# Patient Record
Sex: Male | Born: 1935 | Race: White | Hispanic: No | Marital: Married | Smoking: Former smoker
Health system: Southern US, Community
[De-identification: ages and names within clinical notes are randomized; demographics above are authoritative.]

## PROBLEM LIST (undated history)

## (undated) DIAGNOSIS — M1990 Unspecified osteoarthritis, unspecified site: Secondary | ICD-10-CM

## (undated) DIAGNOSIS — I442 Atrioventricular block, complete: Secondary | ICD-10-CM

## (undated) DIAGNOSIS — H409 Unspecified glaucoma: Secondary | ICD-10-CM

## (undated) DIAGNOSIS — F329 Major depressive disorder, single episode, unspecified: Secondary | ICD-10-CM

## (undated) DIAGNOSIS — I5042 Chronic combined systolic (congestive) and diastolic (congestive) heart failure: Secondary | ICD-10-CM

## (undated) DIAGNOSIS — F419 Anxiety disorder, unspecified: Secondary | ICD-10-CM

## (undated) DIAGNOSIS — I2510 Atherosclerotic heart disease of native coronary artery without angina pectoris: Secondary | ICD-10-CM

## (undated) DIAGNOSIS — E7800 Pure hypercholesterolemia, unspecified: Secondary | ICD-10-CM

## (undated) DIAGNOSIS — I10 Essential (primary) hypertension: Secondary | ICD-10-CM

## (undated) DIAGNOSIS — I4891 Unspecified atrial fibrillation: Secondary | ICD-10-CM

## (undated) DIAGNOSIS — J309 Allergic rhinitis, unspecified: Secondary | ICD-10-CM

## (undated) DIAGNOSIS — N400 Enlarged prostate without lower urinary tract symptoms: Secondary | ICD-10-CM

## (undated) DIAGNOSIS — Z87438 Personal history of other diseases of male genital organs: Secondary | ICD-10-CM

## (undated) DIAGNOSIS — N529 Male erectile dysfunction, unspecified: Secondary | ICD-10-CM

## (undated) DIAGNOSIS — M461 Sacroiliitis, not elsewhere classified: Secondary | ICD-10-CM

## (undated) DIAGNOSIS — G5600 Carpal tunnel syndrome, unspecified upper limb: Secondary | ICD-10-CM

## (undated) DIAGNOSIS — K5730 Diverticulosis of large intestine without perforation or abscess without bleeding: Secondary | ICD-10-CM

## (undated) DIAGNOSIS — IMO0002 Reserved for concepts with insufficient information to code with codable children: Secondary | ICD-10-CM

## (undated) DIAGNOSIS — F3289 Other specified depressive episodes: Secondary | ICD-10-CM

## (undated) DIAGNOSIS — R296 Repeated falls: Secondary | ICD-10-CM

## (undated) HISTORY — DX: Male erectile dysfunction, unspecified: N52.9

## (undated) HISTORY — DX: Pure hypercholesterolemia, unspecified: E78.00

## (undated) HISTORY — DX: Allergic rhinitis, unspecified: J30.9

## (undated) HISTORY — DX: Atrioventricular block, complete: I44.2

## (undated) HISTORY — DX: Unspecified atrial fibrillation: I48.91

## (undated) HISTORY — DX: Carpal tunnel syndrome, unspecified upper limb: G56.00

## (undated) HISTORY — DX: Sacroiliitis, not elsewhere classified: M46.1

## (undated) HISTORY — DX: Personal history of other diseases of male genital organs: Z87.438

## (undated) HISTORY — DX: Unspecified glaucoma: H40.9

## (undated) HISTORY — DX: Diverticulosis of large intestine without perforation or abscess without bleeding: K57.30

## (undated) HISTORY — DX: Anxiety disorder, unspecified: F41.9

## (undated) HISTORY — DX: Chronic combined systolic (congestive) and diastolic (congestive) heart failure: I50.42

## (undated) HISTORY — DX: Unspecified osteoarthritis, unspecified site: M19.90

## (undated) HISTORY — DX: Other specified depressive episodes: F32.89

## (undated) HISTORY — DX: Reserved for concepts with insufficient information to code with codable children: IMO0002

## (undated) HISTORY — DX: Major depressive disorder, single episode, unspecified: F32.9

## (undated) HISTORY — DX: Repeated falls: R29.6

---
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
## 1964-03-11 HISTORY — PX: APPENDECTOMY: SHX54

## 1997-09-28 ENCOUNTER — Observation Stay (HOSPITAL_COMMUNITY): Admission: AD | Admit: 1997-09-28 | Discharge: 1997-09-29 | Payer: Self-pay | Admitting: Cardiology

## 1997-09-28 HISTORY — PX: PACEMAKER INSERTION: SHX728

## 1999-01-19 ENCOUNTER — Ambulatory Visit (HOSPITAL_COMMUNITY): Admission: RE | Admit: 1999-01-19 | Discharge: 1999-01-19 | Payer: Self-pay | Admitting: Gastroenterology

## 1999-09-24 ENCOUNTER — Encounter: Payer: Self-pay | Admitting: Plastic Surgery

## 1999-09-24 ENCOUNTER — Encounter: Admission: RE | Admit: 1999-09-24 | Discharge: 1999-09-24 | Payer: Self-pay | Admitting: Plastic Surgery

## 1999-09-25 ENCOUNTER — Ambulatory Visit (HOSPITAL_BASED_OUTPATIENT_CLINIC_OR_DEPARTMENT_OTHER): Admission: RE | Admit: 1999-09-25 | Discharge: 1999-09-25 | Payer: Self-pay | Admitting: Plastic Surgery

## 2001-01-09 ENCOUNTER — Emergency Department (HOSPITAL_COMMUNITY): Admission: AC | Admit: 2001-01-09 | Discharge: 2001-01-09 | Payer: Self-pay

## 2001-01-09 ENCOUNTER — Encounter: Payer: Self-pay | Admitting: *Deleted

## 2001-01-11 ENCOUNTER — Emergency Department (HOSPITAL_COMMUNITY): Admission: EM | Admit: 2001-01-11 | Discharge: 2001-01-12 | Payer: Self-pay | Admitting: Emergency Medicine

## 2001-01-12 ENCOUNTER — Encounter: Payer: Self-pay | Admitting: Emergency Medicine

## 2001-01-22 ENCOUNTER — Ambulatory Visit (HOSPITAL_COMMUNITY): Admission: RE | Admit: 2001-01-22 | Discharge: 2001-01-22 | Payer: Self-pay | Admitting: Gastroenterology

## 2001-02-26 ENCOUNTER — Encounter: Payer: Self-pay | Admitting: *Deleted

## 2001-02-26 ENCOUNTER — Encounter: Admission: RE | Admit: 2001-02-26 | Discharge: 2001-02-26 | Payer: Self-pay | Admitting: *Deleted

## 2003-04-18 ENCOUNTER — Encounter: Admission: RE | Admit: 2003-04-18 | Discharge: 2003-04-18 | Payer: Self-pay | Admitting: Family Medicine

## 2003-05-01 ENCOUNTER — Emergency Department (HOSPITAL_COMMUNITY): Admission: EM | Admit: 2003-05-01 | Discharge: 2003-05-01 | Payer: Self-pay | Admitting: Emergency Medicine

## 2003-07-04 ENCOUNTER — Inpatient Hospital Stay (HOSPITAL_BASED_OUTPATIENT_CLINIC_OR_DEPARTMENT_OTHER): Admission: RE | Admit: 2003-07-04 | Discharge: 2003-07-04 | Payer: Self-pay | Admitting: Interventional Cardiology

## 2004-03-30 ENCOUNTER — Encounter: Admission: RE | Admit: 2004-03-30 | Discharge: 2004-03-30 | Payer: Self-pay | Admitting: Family Medicine

## 2004-07-26 ENCOUNTER — Encounter: Admission: RE | Admit: 2004-07-26 | Discharge: 2004-07-26 | Payer: Self-pay | Admitting: Family Medicine

## 2004-12-06 ENCOUNTER — Ambulatory Visit (HOSPITAL_COMMUNITY): Admission: RE | Admit: 2004-12-06 | Discharge: 2004-12-06 | Payer: Self-pay | Admitting: Cardiology

## 2007-12-17 ENCOUNTER — Emergency Department (HOSPITAL_COMMUNITY): Admission: EM | Admit: 2007-12-17 | Discharge: 2007-12-17 | Payer: Self-pay | Admitting: Emergency Medicine

## 2007-12-30 ENCOUNTER — Ambulatory Visit (HOSPITAL_COMMUNITY): Admission: RE | Admit: 2007-12-30 | Discharge: 2007-12-30 | Payer: Self-pay | Admitting: Gastroenterology

## 2008-12-09 ENCOUNTER — Encounter: Admission: RE | Admit: 2008-12-09 | Discharge: 2008-12-09 | Payer: Self-pay | Admitting: Sports Medicine

## 2008-12-16 ENCOUNTER — Encounter: Admission: RE | Admit: 2008-12-16 | Discharge: 2008-12-16 | Payer: Self-pay | Admitting: Sports Medicine

## 2008-12-28 ENCOUNTER — Encounter: Admission: RE | Admit: 2008-12-28 | Discharge: 2008-12-28 | Payer: Self-pay | Admitting: Sports Medicine

## 2009-04-03 ENCOUNTER — Encounter: Admission: RE | Admit: 2009-04-03 | Discharge: 2009-04-03 | Payer: Self-pay | Admitting: Sports Medicine

## 2010-02-18 ENCOUNTER — Emergency Department (HOSPITAL_COMMUNITY)
Admission: EM | Admit: 2010-02-18 | Discharge: 2010-02-18 | Payer: Self-pay | Source: Home / Self Care | Admitting: Emergency Medicine

## 2010-05-21 LAB — URINALYSIS, ROUTINE W REFLEX MICROSCOPIC
Bilirubin Urine: NEGATIVE
Glucose, UA: NEGATIVE mg/dL
Ketones, ur: NEGATIVE mg/dL
Protein, ur: NEGATIVE mg/dL

## 2010-05-21 LAB — CBC
HCT: 40.8 % (ref 39.0–52.0)
MCH: 32.3 pg (ref 26.0–34.0)
MCV: 94.7 fL (ref 78.0–100.0)
RBC: 4.31 MIL/uL (ref 4.22–5.81)
RDW: 13.4 % (ref 11.5–15.5)
WBC: 17.3 10*3/uL — ABNORMAL HIGH (ref 4.0–10.5)

## 2010-05-21 LAB — POCT I-STAT, CHEM 8
BUN: 13 mg/dL (ref 6–23)
Calcium, Ion: 1.13 mmol/L (ref 1.12–1.32)
Creatinine, Ser: 0.8 mg/dL (ref 0.4–1.5)
TCO2: 26 mmol/L (ref 0–100)

## 2010-05-21 LAB — HEPATIC FUNCTION PANEL
AST: 29 U/L (ref 0–37)
Bilirubin, Direct: 0.2 mg/dL (ref 0.0–0.3)
Total Bilirubin: 0.6 mg/dL (ref 0.3–1.2)

## 2010-05-21 LAB — URINE CULTURE
Colony Count: NO GROWTH
Culture: NO GROWTH

## 2010-05-21 LAB — DIFFERENTIAL
Eosinophils Relative: 1 % (ref 0–5)
Lymphocytes Relative: 12 % (ref 12–46)
Lymphs Abs: 2 10*3/uL (ref 0.7–4.0)
Monocytes Absolute: 2.1 10*3/uL — ABNORMAL HIGH (ref 0.1–1.0)

## 2010-05-21 LAB — LIPASE, BLOOD: Lipase: 22 U/L (ref 11–59)

## 2010-07-27 NOTE — Op Note (Signed)
Randall Odom, BANIK                ACCOUNT NO.:  [id number]   MEDICAL RECORD NO.:  [id number]          PATIENT TYPE:  OIB   LOCATION:  2888                         FACILITY:  [facility]   PHYSICIAN:  Francisca December, M.D.  DATE OF BIRTH:  1926-11-29   DATE OF PROCEDURE:  12/06/2004  DATE OF DISCHARGE:                                 OPERATIVE REPORT   PROCEDURE PERFORMED:  1.  Explant, old pacing generator.  2.  Replace dual-chamber pacemaker, new battery.   SURGEON:  Francisca December, M.D.   INDICATIONS:  A St. Jude device, 6 years status post original implantation,  now at EOL.  This is the third device for this patient.  He had an upgrade  to a dual-chamber device in 1999.   PROCEDURE:  The patient was brought to the cardiac catheterization  laboratory in the fasting state.  The right groin was prepped and draped in  the usual sterile fashion.  Local anesthesia was obtained with infiltration  of 1% lidocaine.  A 6- to 7-cm incision was made over the previous pacemaker  surgical wound.  This was carried down by sharp dissection to the generator  capsule.  The capsule was incised and the pacemaker delivered without  difficulty.  The leads were disconnected from the old pacing generator and  tested for adequate pacing parameters.  This is reported below.  The pocket  was then copiously irrigated using 1% kanamycin solution.  a new pacing  generator was connected to the pacing leads, carefully identifying each by  its serial number, placing each in the appropriate receptacle, and  tightening each into place.  Each lead was tested for security.  The pacing  generator was then returned to the pocket.  The pocket inspected for  bleeding and none was found.  The pocket was then closed using 2-0 Vicryl in  a running fashion for the subcutaneous layer.  The skin was approximated  using 4-0 Vicryl in a running subcuticular fashion.  Steri-Strips and a  sterile dressing were applied and the  patient was transported to the  recovery area in stable condition.   The new device is a St. Jude Identity ADX XL DR, model number [id number], serial  number [id number].   The pacing lead data:  The ventricular lead detected a 10.5-mV R wave.  The  pacing threshold was 0.7 volts at 0.5 milliseconds.  The lead impedance was  491 ohms; this resulted in a currented capture threshold 1.3 mA.  The atrial  lead detected a 1.4-mV P wave.  The pacing threshold was 0.8 volts at 0.5-  millisecond pulse width.  The impedance was 339 ohms, resulting in an  currented capture threshold of 2.4 mA.      Francisca December, M.D.  Electronically Signed     JHE/MEDQ  D:  12/06/2004  T:  12/07/2004  Job:  [id number]

## 2010-07-27 NOTE — Cardiovascular Report (Signed)
NAME:  Randall Odom, Randall Odom                          ACCOUNT NO.:  [id number]   MEDICAL RECORD NO.:  [id number]                   PATIENT TYPE:  OIB   LOCATION:  6501                                 FACILITY:  [facility]   PHYSICIAN:  [name], M.D.            DATE OF BIRTH:  04/21/1926   DATE OF PROCEDURE:  07/04/2003  DATE OF DISCHARGE:                              CARDIAC CATHETERIZATION   INDICATIONS FOR PROCEDURE:  Abnormal Cardiolite study suggesting possibility  of inferior fixed defect compatible with possible prior infarction, mild  global hypokinesis with 48%.  It was felt that coronary disease needs to be  excluded.   DATE OF PROCEDURE:  July 04, 2003.   PROCEDURE PERFORMED:  1. Left heart catheterization.  2. Selective coronary angiography.  3. Left ventriculography.   DESCRIPTION:  After informed consent, a 4-French sheath was placed in the  right femoral artery using modified Seldinger technique.  A 4-French JR  catheter was used for right coronary angiography, 4-French #4 left Judkins  catheter for left coronary angiography and a 4-French pigtail angled  catheter for LV angiography.  Left ventricular angiography was performed at  10 mL per second for a total of 20 mL.  The patient tolerated the procedure  without complications.   RESULTS:   I. HEMODYNAMIC DATA:  A.  Left ventricular pressure 140/9.  B.  Aortic pressure 138/57.   II. LEFT VENTRICULOGRAPHY:  There is mild global hypokinesis and some  __________during contraction.  This is likely secondary to right ventricular  pacing.  No mitral regurgitation is noted.  The estimated EF is 45%.   III. CORONARY ANGIOGRAPHY:  A.  Left main coronary:  Left main was short and  free of any obstruction.  B.  Left anterior descending coronary:  A moderate size vessel that gives  origin to a large branching diagonal that arises from the proximal vessel.  Irregularities are noted in the LAD in the proximal and mid  vessel, both  proximal and beyond the origin of the diagonal.  The diagonal bifurcates and  also has some irregularity, but no significant obstruction lesions were  noted in the LAD.  C.  Circumflex artery:  The circumflex coronary artery is large and free of  any significant obstruction.  Minimal irregularities are noted in the mid  vessel.  Two obtuse marginal branches arise from the circumflex.  D.  Right coronary:  Gives origin to PDA and two left ventricular branches.  Irregularities are noted in the mid vessel.  No significant abnormalities  were seen.   CONCLUSIONS:  1. Low normal, mildly progressive left ventricular function.  There is an     abnormal contractile pattern felt to be secondary to right ventricular     pacing.  2. No significant obstructive coronary lesions were noted.  Irregularities     were noted in the circumflex, LAD and     right  coronary.  3. Abnormality noted on Cardiolite scintigraphy likely to represent soft     tissue attenuation.   PLAN:  No further specific therapy.                                               [name], M.D.    HWS/MEDQ  D:  07/04/2003  T:  07/04/2003  Job:  [id number]

## 2010-12-10 LAB — DIFFERENTIAL
Eosinophils Relative: 2
Lymphocytes Relative: 26
Lymphs Abs: 2.8
Monocytes Absolute: 1.2 — ABNORMAL HIGH
Monocytes Relative: 11

## 2010-12-10 LAB — CBC
MCV: 97.4
Platelets: 194
WBC: 10.8 — ABNORMAL HIGH

## 2010-12-10 LAB — COMPREHENSIVE METABOLIC PANEL
AST: 32
Albumin: 3.8
Calcium: 9.7
Chloride: 110
Creatinine, Ser: 0.79
GFR calc Af Amer: >60
Total Bilirubin: 0.8

## 2010-12-10 LAB — URINALYSIS, ROUTINE W REFLEX MICROSCOPIC
Bilirubin Urine: NEGATIVE
Glucose, UA: NEGATIVE
Hgb urine dipstick: NEGATIVE
Specific Gravity, Urine: <1.005 — ABNORMAL LOW
Urobilinogen, UA: 0.2

## 2011-02-11 ENCOUNTER — Emergency Department (HOSPITAL_COMMUNITY)
Admission: EM | Admit: 2011-02-11 | Discharge: 2011-02-11 | Disposition: A | Discharge status: Home / Self Care | Payer: Medicare Other | Attending: Emergency Medicine | Admitting: Emergency Medicine

## 2011-02-11 DIAGNOSIS — K5900 Constipation, unspecified: Secondary | ICD-10-CM | POA: Insufficient documentation

## 2011-02-11 DIAGNOSIS — I10 Essential (primary) hypertension: Secondary | ICD-10-CM | POA: Insufficient documentation

## 2011-02-11 DIAGNOSIS — E119 Type 2 diabetes mellitus without complications: Secondary | ICD-10-CM | POA: Insufficient documentation

## 2011-02-11 HISTORY — DX: Essential (primary) hypertension: I10

## 2011-02-11 HISTORY — DX: Benign prostatic hyperplasia without lower urinary tract symptoms: N40.0

## 2011-02-11 MED ORDER — SODIUM CHLORIDE 0.9 % IV SOLN: Freq: Once | INTRAVENOUS | Status: DC

## 2011-02-11 MED ORDER — FLEET ENEMA 7-19 GM/118ML RE ENEM
1.0000 | ENEMA | Freq: Once | RECTAL | Status: AC
  Administered 2011-02-11: 1 via RECTAL
  Filled 2011-02-11: qty 1

## 2011-02-11 NOTE — ED Notes (Signed)
ZOX:[id number]<AV> Expected date:02/11/11<BR> Expected time: 4:05 PM<BR> Means of arrival:Ambulance<BR> Comments:<BR> EMS 10 GC, 84 yom constipation

## 2011-02-11 NOTE — ED Provider Notes (Signed)
History     CSN: [id number] Arrival date & time: 02/11/2011  4:58 PM   First MD Initiated Contact with Patient 02/11/11 1707      Chief Complaint  Patient presents with  . Constipation    (Consider location/radiation/quality/duration/timing/severity/associated sxs/prior treatment) Patient is a 77 y.o. male presenting with constipation. The history is provided by the patient.  Constipation    complaining of constipation x5 days. No vomiting or fever noted does note some abdominal cramping. Has used stool softeners and a fleets enema at home without resolution. History of constipation x1. Patient called EMS to 2 his discomfort  Past Medical History  Diagnosis Date  . Hypertension   . Muscle spasm   . Diabetes mellitus   . Enlarged prostate     Past Surgical History  Procedure Date  . Pacemaker insertion     No family history on file.  History  Substance Use Topics  . Smoking status: Never Smoker   . Smokeless tobacco: Not on file  . Alcohol Use: No      Review of Systems  Gastrointestinal: Positive for constipation.  All other systems reviewed and are negative.    Allergies  Review of patient's allergies indicates no known allergies.  Home Medications   Current Outpatient Rx  Name Route Sig Dispense Refill  . ASPIRIN EC 81 MG PO TBEC Oral Take 81 mg by mouth daily.      Marland Kitchen DIAZEPAM 10 MG PO TABS Oral Take 10 mg by mouth every 6 (six) hours as needed. anxiety     . ESOMEPRAZOLE MAGNESIUM 40 MG PO CPDR Oral Take 40 mg by mouth daily before breakfast.      . GLIPIZIDE 10 MG PO TABS Oral Take 10 mg by mouth daily.      Marland Kitchen SIMVASTATIN 40 MG PO TABS Oral Take 40 mg by mouth at bedtime.      . TERAZOSIN HCL 5 MG PO CAPS Oral Take 5 mg by mouth at bedtime.      . VENLAFAXINE HCL 75 MG PO CP24 Oral Take 75 mg by mouth daily.        BP 94/39  Pulse 60  Temp(Src) 97.9 F (36.6 C) (Oral)  Resp 20  SpO2 96%  Physical Exam  Nursing note and vitals  reviewed. Constitutional: He is oriented to person, place, and time. He appears well-developed and well-nourished.  Non-toxic appearance. No distress.  HENT:  Head: Normocephalic and atraumatic.  Eyes: Conjunctivae, EOM and lids are normal. Pupils are equal, round, and reactive to light.  Neck: Normal range of motion. Neck supple. No tracheal deviation present. No mass present.  Cardiovascular: Normal rate, regular rhythm and normal heart sounds.  Exam reveals no gallop.   No murmur heard. Pulmonary/Chest: Effort normal and breath sounds normal. No stridor. No respiratory distress. He has no decreased breath sounds. He has no wheezes. He has no rhonchi. He has no rales.  Abdominal: Soft. Normal appearance and bowel sounds are normal. He exhibits no distension. There is no tenderness. There is no rebound and no CVA tenderness.  Genitourinary: Rectal exam shows tenderness.  Musculoskeletal: Normal range of motion. He exhibits no edema and no tenderness.  Neurological: He is alert and oriented to person, place, and time. He has normal strength. No cranial nerve deficit or sensory deficit. GCS eye subscore is 4. GCS verbal subscore is 5. GCS motor subscore is 6.  Skin: Skin is warm and dry. No abrasion and no rash noted.  Psychiatric: He has a normal mood and affect. His speech is normal and behavior is normal.    ED Course  Procedures (including critical care time)  Labs Reviewed - No data to display No results found.   No diagnosis found.    MDM  Patient disimpacted with copious amounts of stool. Patient given an enema by nursing with good result. Patient was back to his baseline        Toy Baker, MD 02/11/11 1944

## 2011-02-11 NOTE — ED Notes (Signed)
Per ems pt from home c/o constipation x 5 days, tried enema at home with no relief.

## 2011-03-25 DIAGNOSIS — C44221 Squamous cell carcinoma of skin of unspecified ear and external auricular canal: Secondary | ICD-10-CM | POA: Diagnosis not present

## 2011-03-26 DIAGNOSIS — K589 Irritable bowel syndrome without diarrhea: Secondary | ICD-10-CM | POA: Diagnosis not present

## 2011-03-26 DIAGNOSIS — I10 Essential (primary) hypertension: Secondary | ICD-10-CM | POA: Diagnosis not present

## 2011-03-26 DIAGNOSIS — E119 Type 2 diabetes mellitus without complications: Secondary | ICD-10-CM | POA: Diagnosis not present

## 2011-04-16 DIAGNOSIS — Z950 Presence of cardiac pacemaker: Secondary | ICD-10-CM | POA: Diagnosis not present

## 2011-05-06 ENCOUNTER — Emergency Department (HOSPITAL_COMMUNITY): Payer: Medicare Other

## 2011-05-06 ENCOUNTER — Encounter (HOSPITAL_COMMUNITY): Payer: Self-pay | Admitting: *Deleted

## 2011-05-06 ENCOUNTER — Other Ambulatory Visit: Payer: Self-pay

## 2011-05-06 ENCOUNTER — Inpatient Hospital Stay (HOSPITAL_COMMUNITY)
Admission: EM | Admit: 2011-05-06 | Discharge: 2011-05-09 | DRG: 074 | Disposition: A | Discharge status: Skilled Nursing Facility | Payer: Medicare Other | Attending: Internal Medicine | Admitting: Internal Medicine

## 2011-05-06 DIAGNOSIS — I10 Essential (primary) hypertension: Secondary | ICD-10-CM | POA: Diagnosis present

## 2011-05-06 DIAGNOSIS — Z79899 Other long term (current) drug therapy: Secondary | ICD-10-CM | POA: Diagnosis not present

## 2011-05-06 DIAGNOSIS — Z7982 Long term (current) use of aspirin: Secondary | ICD-10-CM

## 2011-05-06 DIAGNOSIS — Z9181 History of falling: Secondary | ICD-10-CM | POA: Diagnosis not present

## 2011-05-06 DIAGNOSIS — F341 Dysthymic disorder: Secondary | ICD-10-CM | POA: Diagnosis present

## 2011-05-06 DIAGNOSIS — N400 Enlarged prostate without lower urinary tract symptoms: Secondary | ICD-10-CM | POA: Diagnosis present

## 2011-05-06 DIAGNOSIS — E1149 Type 2 diabetes mellitus with other diabetic neurological complication: Principal | ICD-10-CM | POA: Diagnosis present

## 2011-05-06 DIAGNOSIS — R269 Unspecified abnormalities of gait and mobility: Secondary | ICD-10-CM | POA: Diagnosis not present

## 2011-05-06 DIAGNOSIS — E785 Hyperlipidemia, unspecified: Secondary | ICD-10-CM | POA: Diagnosis present

## 2011-05-06 DIAGNOSIS — R55 Syncope and collapse: Secondary | ICD-10-CM | POA: Diagnosis not present

## 2011-05-06 DIAGNOSIS — I951 Orthostatic hypotension: Secondary | ICD-10-CM | POA: Diagnosis present

## 2011-05-06 DIAGNOSIS — E119 Type 2 diabetes mellitus without complications: Secondary | ICD-10-CM | POA: Diagnosis not present

## 2011-05-06 DIAGNOSIS — S79929A Unspecified injury of unspecified thigh, initial encounter: Secondary | ICD-10-CM | POA: Diagnosis not present

## 2011-05-06 DIAGNOSIS — K219 Gastro-esophageal reflux disease without esophagitis: Secondary | ICD-10-CM | POA: Diagnosis present

## 2011-05-06 DIAGNOSIS — R42 Dizziness and giddiness: Secondary | ICD-10-CM | POA: Diagnosis not present

## 2011-05-06 DIAGNOSIS — I2510 Atherosclerotic heart disease of native coronary artery without angina pectoris: Secondary | ICD-10-CM | POA: Diagnosis present

## 2011-05-06 DIAGNOSIS — Z5189 Encounter for other specified aftercare: Secondary | ICD-10-CM | POA: Diagnosis not present

## 2011-05-06 DIAGNOSIS — Z950 Presence of cardiac pacemaker: Secondary | ICD-10-CM | POA: Diagnosis not present

## 2011-05-06 DIAGNOSIS — M6281 Muscle weakness (generalized): Secondary | ICD-10-CM | POA: Diagnosis not present

## 2011-05-06 DIAGNOSIS — G909 Disorder of the autonomic nervous system, unspecified: Secondary | ICD-10-CM | POA: Diagnosis present

## 2011-05-06 DIAGNOSIS — R5381 Other malaise: Secondary | ICD-10-CM | POA: Diagnosis not present

## 2011-05-06 DIAGNOSIS — W19XXXA Unspecified fall, initial encounter: Secondary | ICD-10-CM

## 2011-05-06 DIAGNOSIS — S79919A Unspecified injury of unspecified hip, initial encounter: Secondary | ICD-10-CM | POA: Diagnosis not present

## 2011-05-06 DIAGNOSIS — R531 Weakness: Secondary | ICD-10-CM

## 2011-05-06 DIAGNOSIS — G319 Degenerative disease of nervous system, unspecified: Secondary | ICD-10-CM | POA: Diagnosis not present

## 2011-05-06 DIAGNOSIS — IMO0001 Reserved for inherently not codable concepts without codable children: Secondary | ICD-10-CM | POA: Diagnosis not present

## 2011-05-06 DIAGNOSIS — F411 Generalized anxiety disorder: Secondary | ICD-10-CM | POA: Diagnosis not present

## 2011-05-06 DIAGNOSIS — R279 Unspecified lack of coordination: Secondary | ICD-10-CM | POA: Diagnosis not present

## 2011-05-06 DIAGNOSIS — I6529 Occlusion and stenosis of unspecified carotid artery: Secondary | ICD-10-CM | POA: Diagnosis present

## 2011-05-06 HISTORY — DX: Atherosclerotic heart disease of native coronary artery without angina pectoris: I25.10

## 2011-05-06 LAB — URINALYSIS, ROUTINE W REFLEX MICROSCOPIC
Bilirubin Urine: NEGATIVE
Glucose, UA: NEGATIVE mg/dL
Hgb urine dipstick: NEGATIVE
Ketones, ur: NEGATIVE mg/dL
Protein, ur: NEGATIVE mg/dL
pH: 5.5 (ref 5.0–8.0)

## 2011-05-06 LAB — CBC
HCT: 42 % (ref 39.0–52.0)
HCT: 40.9 % (ref 39.0–52.0)
Hemoglobin: 14.1 g/dL (ref 13.0–17.0)
Hemoglobin: 13.7 g/dL (ref 13.0–17.0)
MCH: 32.3 pg (ref 26.0–34.0)
MCV: 96.3 fL (ref 78.0–100.0)
Platelets: 135 10*3/uL — ABNORMAL LOW (ref 150–400)
RBC: 4.36 MIL/uL (ref 4.22–5.81)
WBC: 11.1 10*3/uL — ABNORMAL HIGH (ref 4.0–10.5)

## 2011-05-06 LAB — DIFFERENTIAL
Eosinophils Absolute: 0.2 10*3/uL (ref 0.0–0.7)
Eosinophils Relative: 2 % (ref 0–5)
Lymphocytes Relative: 28 % (ref 12–46)
Lymphs Abs: 2.7 10*3/uL (ref 0.7–4.0)
Monocytes Absolute: 1.1 10*3/uL — ABNORMAL HIGH (ref 0.1–1.0)
Monocytes Relative: 12 % (ref 3–12)

## 2011-05-06 LAB — POCT I-STAT, CHEM 8
BUN: 18 mg/dL (ref 6–23)
Creatinine, Ser: 1 mg/dL (ref 0.50–1.35)
Hemoglobin: 15 g/dL (ref 13.0–17.0)
Potassium: 4 mEq/L (ref 3.5–5.1)
Sodium: 144 mEq/L (ref 135–145)
TCO2: 27 mmol/L (ref 0–100)

## 2011-05-06 LAB — POCT I-STAT TROPONIN I: Troponin i, poc: 0 ng/mL (ref 0.00–0.08)

## 2011-05-06 LAB — CREATININE, SERUM
GFR calc Af Amer: >90 mL/min (ref >90)
GFR calc non Af Amer: 81 mL/min — ABNORMAL LOW (ref >90)

## 2011-05-06 LAB — URINE MICROSCOPIC-ADD ON

## 2011-05-06 MED ORDER — PANTOPRAZOLE SODIUM 40 MG PO TBEC
40.0000 mg | DELAYED_RELEASE_TABLET | Freq: Every day | ORAL | Status: DC
  Administered 2011-05-06 – 2011-05-09 (×4): 40 mg via ORAL
  Filled 2011-05-06 (×4): qty 1

## 2011-05-06 MED ORDER — VENLAFAXINE HCL ER 75 MG PO CP24
75.0000 mg | ORAL_CAPSULE | Freq: Every day | ORAL | Status: DC
  Administered 2011-05-06 – 2011-05-09 (×4): 75 mg via ORAL
  Filled 2011-05-06 (×4): qty 1

## 2011-05-06 MED ORDER — HEPARIN SODIUM (PORCINE) 5000 UNIT/ML IJ SOLN
5000.0000 [IU] | Freq: Three times a day (TID) | INTRAMUSCULAR | Status: DC
  Administered 2011-05-06 – 2011-05-09 (×8): 5000 [IU] via SUBCUTANEOUS
  Filled 2011-05-06 (×11): qty 1

## 2011-05-06 MED ORDER — SODIUM CHLORIDE 0.9 % IV SOLN
INTRAVENOUS | Status: AC
  Administered 2011-05-06: 17:00:00 via INTRAVENOUS

## 2011-05-06 MED ORDER — ADULT MULTIVITAMIN W/MINERALS CH
1.0000 | ORAL_TABLET | Freq: Every day | ORAL | Status: DC
  Administered 2011-05-06 – 2011-05-09 (×4): 1 via ORAL
  Filled 2011-05-06 (×4): qty 1

## 2011-05-06 MED ORDER — SODIUM CHLORIDE 0.9 % IV BOLUS (SEPSIS)
500.0000 mL | Freq: Once | INTRAVENOUS | Status: AC
  Administered 2011-05-06: 500 mL via INTRAVENOUS

## 2011-05-06 MED ORDER — GLIPIZIDE 10 MG PO TABS
10.0000 mg | ORAL_TABLET | Freq: Every day | ORAL | Status: DC
  Administered 2011-05-06 – 2011-05-09 (×4): 10 mg via ORAL
  Filled 2011-05-06 (×4): qty 1

## 2011-05-06 MED ORDER — SIMVASTATIN 40 MG PO TABS
40.0000 mg | ORAL_TABLET | Freq: Every day | ORAL | Status: DC
  Administered 2011-05-06 – 2011-05-08 (×3): 40 mg via ORAL
  Filled 2011-05-06 (×6): qty 1

## 2011-05-06 MED ORDER — SODIUM CHLORIDE 0.9 % IV SOLN: 250.0000 mL | INTRAVENOUS | Status: DC | PRN

## 2011-05-06 MED ORDER — ACETAMINOPHEN 325 MG PO TABS
650.0000 mg | ORAL_TABLET | Freq: Four times a day (QID) | ORAL | Status: DC | PRN
  Administered 2011-05-09: 650 mg via ORAL
  Filled 2011-05-06: qty 2

## 2011-05-06 MED ORDER — SODIUM CHLORIDE 0.9 % IJ SOLN
3.0000 mL | Freq: Two times a day (BID) | INTRAMUSCULAR | Status: DC
  Administered 2011-05-07 – 2011-05-09 (×5): 3 mL via INTRAVENOUS

## 2011-05-06 MED ORDER — DIAZEPAM 5 MG PO TABS
5.0000 mg | ORAL_TABLET | Freq: Four times a day (QID) | ORAL | Status: DC | PRN
  Administered 2011-05-06 – 2011-05-07 (×2): 5 mg via ORAL
  Filled 2011-05-06 (×2): qty 1

## 2011-05-06 MED ORDER — TERAZOSIN HCL 5 MG PO CAPS
5.0000 mg | ORAL_CAPSULE | Freq: Every day | ORAL | Status: DC
  Administered 2011-05-06: 5 mg via ORAL
  Filled 2011-05-06 (×2): qty 1

## 2011-05-06 MED ORDER — SODIUM CHLORIDE 0.9 % IJ SOLN: 3.0000 mL | INTRAMUSCULAR | Status: DC | PRN

## 2011-05-06 MED ORDER — ASPIRIN EC 81 MG PO TBEC
81.0000 mg | DELAYED_RELEASE_TABLET | Freq: Every day | ORAL | Status: DC
  Administered 2011-05-06 – 2011-05-09 (×4): 81 mg via ORAL
  Filled 2011-05-06 (×4): qty 1

## 2011-05-06 MED ORDER — ACETAMINOPHEN 650 MG RE SUPP: 650.0000 mg | Freq: Four times a day (QID) | RECTAL | Status: DC | PRN

## 2011-05-06 MED ORDER — INSULIN ASPART 100 UNIT/ML ~~LOC~~ SOLN
0.0000 [IU] | Freq: Three times a day (TID) | SUBCUTANEOUS | Status: DC
  Administered 2011-05-08: 3 [IU] via SUBCUTANEOUS
  Administered 2011-05-09: 2 [IU] via SUBCUTANEOUS
  Filled 2011-05-06: qty 3

## 2011-05-06 NOTE — ED Notes (Signed)
Pt states that for the past few days he has been very weak and states that when he stands up he starts to fall backwards and cannot correct it before he hits the ground. He states that at times he feels dizzy. He states that he is having right lower back pain. He states that he has been having uti s/s with frequency and sometimes hesitancy and burning. Breath sounds are clear and bowel sounds are present. Iv started and protocols initiated. Speech is clear, no facial droop noted. Pt states that he has bilateral lower leg tingling but contributes this to his diabetic neuropathy. Family at the bedside states that when the patient has fallen he has hit his head. Pt states that his head is a little sore. Family and pt deny any loc with the fall.

## 2011-05-06 NOTE — ED Provider Notes (Signed)
Medical screening examination/treatment/procedure(s) were conducted as a shared visit with non-physician practitioner(s) and myself.  I personally evaluated the patient during the encounter  Patient with hx of several falls and weakness although no distinct focal deficit. Head CT negative but needs admission and MRI work up. [facility] will admit.   Randall Jakes, MD 05/06/11 [phone]

## 2011-05-06 NOTE — H&P (Signed)
PCP:   Hoyle Sauer, MD, MD   Chief Complaint:  "Unsteady on my feet when I stand up"  HPI: Pt is an 78 y/o CM with HTN, DM (~ 15 years on oral hypoglycemic medication),  s/p placemaker placement that is presenting to the ED complaining of near syncope which has been a recurrent problem for the patient.  Mentions that he has had this problem for the last 2 years but mentions that within the past he may have felt dizzy and that would resolved within 5 minutes or less.  What is different now is that it has been associated with falls x 2.  He states that he hit his head with the last episode.  Mentions that when he tried to get out of bed and then he fell forward.  Episodes are related to positional changes and last several minutes but self resolve.  States that his vision has blacked out while these episodes are occuring and self resolved in seconds.  Wife and patient report that he has not had any seizure like activity.   States that his pacemaker was "checked" 3 weeks ago.  Reportedly by ED PA patient's pacemaker is to get interrogated while in the ED today.  Patient denies any recent upper respiratory infections, ringing in his ears, fever or chills.    Allergies:  No Known Allergies    Past Medical History  Diagnosis Date  . Hypertension   . Muscle spasm   . Diabetes mellitus   . Enlarged prostate   . CAD (coronary artery disease)   . Pacemaker     Past Surgical History  Procedure Date  . Pacemaker insertion     Prior to Admission medications   Medication Sig Start Date End Date Taking? Authorizing Provider  aspirin EC 81 MG tablet Take 81 mg by mouth daily.     Yes Historical Provider, MD  Cholecalciferol (VITAMIN D) 2000 UNITS tablet Take 2,000 Units by mouth daily.   Yes Historical Provider, MD  diazepam (VALIUM) 10 MG tablet Take 10 mg by mouth every 6 (six) hours as needed. anxiety    Yes Historical Provider, MD  esomeprazole (NEXIUM) 40 MG capsule Take 40 mg by mouth  daily before breakfast.     Yes Historical Provider, MD  Flax OIL Take 1 tablet by mouth daily.   Yes Historical Provider, MD  glipiZIDE (GLUCOTROL) 10 MG tablet Take 10 mg by mouth daily.     Yes Historical Provider, MD  lisinopril (PRINIVIL,ZESTRIL) 10 MG tablet Take 5 mg by mouth at bedtime.   Yes Historical Provider, MD  Multiple Vitamin (MULITIVITAMIN WITH MINERALS) TABS Take 1 tablet by mouth daily.   Yes Historical Provider, MD  OVER THE COUNTER MEDICATION Take 4 tablets by mouth daily. Sal Palmetto.   Yes Historical Provider, MD  OVER THE COUNTER MEDICATION Take 1 tablet by mouth daily. Mega D-3   Yes Historical Provider, MD  polyethylene glycol (MIRALAX / GLYCOLAX) packet Take 17 g by mouth daily as needed. As needed for constipation.   Yes Historical Provider, MD  simvastatin (ZOCOR) 40 MG tablet Take 40 mg by mouth at bedtime.     Yes Historical Provider, MD  terazosin (HYTRIN) 5 MG capsule Take 5 mg by mouth at bedtime.     Yes Historical Provider, MD  venlafaxine (EFFEXOR-XR) 75 MG 24 hr capsule Take 75 mg by mouth daily.      Historical Provider, MD    Social History:  reports that he  has never smoked. He does not have any smokeless tobacco history on file. He reports that he does not drink alcohol or use illicit drugs.  No family history on file.  Review of Systems:  Constitutional: Denies fever, chills, diaphoresis, appetite change and fatigue.  HEENT: Denies photophobia, eye pain, redness, hearing loss, ear pain, congestion, sore throat, rhinorrhea, sneezing, mouth sores, trouble swallowing, neck pain, neck stiffness and tinnitus.   Respiratory: Denies SOB, DOE, cough, chest tightness,  and wheezing.   Cardiovascular: Denies chest pain, palpitations and leg swelling.  Gastrointestinal: Denies nausea, vomiting, abdominal pain, diarrhea, constipation, blood in stool and abdominal distention.  Genitourinary: Denies dysuria, urgency, frequency, hematuria, flank pain and  difficulty urinating.  Musculoskeletal: Denies myalgias, back pain, joint swelling, arthralgias and gait problem.  Skin: Denies pallor, rash and wound.  Neurological:+ dizziness with position changes, Denies seizures, + syncope, Denies weakness, light-headedness, numbness and headaches.  Hematological: Denies adenopathy. Easy bruising, personal or family bleeding history  Psychiatric/Behavioral: Denies suicidal ideation, mood changes, confusion, nervousness, sleep disturbance and agitation   Physical Exam: Blood pressure 128/51, pulse 60, temperature 97.1 F (36.2 C), temperature source Oral, resp. rate 20, height 5\' 9"  (1.753 m), weight 87.091 kg (192 lb), SpO2 99.00%. General: Alert, awake, oriented x3, in no acute distress. HEENT: normocephalic, no contusions noted, tenderness at occipital scalp, No rhinorrhea, Normal tympanic membranes no errythema No bruits, no goiter. Heart: Regular rate and rhythm, without murmurs, rubs, gallops. Lungs: Clear to auscultation bilaterally. No increased WOB Abdomen: Soft, nontender, nondistended, positive bowel sounds. Extremities: No clubbing cyanosis or edema with positive pedal pulses. Neuro: Grossly intact, nonfocal.    Labs on Admission:  Results for orders placed during the hospital encounter of 05/06/11 (from the past 48 hour(s))  CBC     Status: Abnormal   Collection Time   05/06/11 11:56 AM      Component Value Range Comment   WBC 9.5  4.0 - 10.5 (K/uL)    RBC 4.36  4.22 - 5.81 (MIL/uL)    Hemoglobin 14.1  13.0 - 17.0 (g/dL)    HCT 16.1  09.6 - 04.5 (%)    MCV 96.3  78.0 - 100.0 (fL)    MCH 32.3  26.0 - 34.0 (pg)    MCHC 33.6  30.0 - 36.0 (g/dL)    RDW 40.9  81.1 - 91.4 (%)    Platelets 135 (*) 150 - 400 (K/uL)   DIFFERENTIAL     Status: Abnormal   Collection Time   05/06/11 11:56 AM      Component Value Range Comment   Neutrophils Relative 58  43 - 77 (%)    Neutro Abs 5.5  1.7 - 7.7 (K/uL)    Lymphocytes Relative 28  12 - 46 (%)     Lymphs Abs 2.7  0.7 - 4.0 (K/uL)    Monocytes Relative 12  3 - 12 (%)    Monocytes Absolute 1.1 (*) 0.1 - 1.0 (K/uL)    Eosinophils Relative 2  0 - 5 (%)    Eosinophils Absolute 0.2  0.0 - 0.7 (K/uL)    Basophils Relative 0  0 - 1 (%)    Basophils Absolute 0.0  0.0 - 0.1 (K/uL)   POCT I-STAT TROPONIN I     Status: Normal   Collection Time   05/06/11 12:11 PM      Component Value Range Comment   Troponin i, poc 0.00  0.00 - 0.08 (ng/mL)    Comment 3  POCT I-STAT, CHEM 8     Status: Abnormal   Collection Time   05/06/11 12:13 PM      Component Value Range Comment   Sodium 144  135 - 145 (mEq/L)    Potassium 4.0  3.5 - 5.1 (mEq/L)    Chloride 106  96 - 112 (mEq/L)    BUN 18  6 - 23 (mg/dL)    Creatinine, Ser 1.61  0.50 - 1.35 (mg/dL)    Glucose, Bld 096 (*) 70 - 99 (mg/dL)    Calcium, Ion 0.45  1.12 - 1.32 (mmol/L)    TCO2 27  0 - 100 (mmol/L)    Hemoglobin 15.0  13.0 - 17.0 (g/dL)    HCT 40.9  81.1 - 91.4 (%)   URINALYSIS, ROUTINE W REFLEX MICROSCOPIC     Status: Abnormal   Collection Time   05/06/11  1:14 PM      Component Value Range Comment   Color, Urine YELLOW  YELLOW     APPearance CLEAR  CLEAR     Specific Gravity, Urine 1.019  1.005 - 1.030     pH 5.5  5.0 - 8.0     Glucose, UA NEGATIVE  NEGATIVE (mg/dL)    Hgb urine dipstick NEGATIVE  NEGATIVE     Bilirubin Urine NEGATIVE  NEGATIVE     Ketones, ur NEGATIVE  NEGATIVE (mg/dL)    Protein, ur NEGATIVE  NEGATIVE (mg/dL)    Urobilinogen, UA 0.2  0.0 - 1.0 (mg/dL)    Nitrite NEGATIVE  NEGATIVE     Leukocytes, UA SMALL (*) NEGATIVE    URINE MICROSCOPIC-ADD ON     Status: Normal   Collection Time   05/06/11  1:14 PM      Component Value Range Comment   Squamous Epithelial / LPF RARE  RARE     WBC, UA 0-2  <3 (WBC/hpf)     Radiological Exams on Admission: Dg Chest 2 View  05/06/2011  *RADIOLOGY REPORT*  Clinical Data: Multiple falls, weakness  CHEST - 2 VIEW  Comparison: 07/26/2004  Findings: Lungs are  clear.  No pleural effusion or pneumothorax.  Cardiomediastinal silhouette is within normal limits.  Right subclavian pacemaker.  Degenerative changes of the visualized thoracolumbar spine.  IMPRESSION: No evidence of acute cardiopulmonary disease.  Original Report Authenticated By: Charline Bills, M.D.   Dg Hip Complete Right  05/06/2011  *RADIOLOGY REPORT*  Clinical Data: Weakness.  Fall  RIGHT HIP - COMPLETE 2+ VIEW  Comparison: None.  Findings: There is no evidence of fracture or dislocation.  There is no evidence of arthropathy or other focal bone abnormality. Soft tissues are unremarkable.  IMPRESSION: Negative exam.  Original Report Authenticated By: Rosealee Albee, M.D.   Ct Head Wo Contrast  05/06/2011  *RADIOLOGY REPORT*  Clinical Data:  Weakness, fall, dizziness  CT HEAD WITHOUT CONTRAST CT CERVICAL SPINE WITHOUT CONTRAST  Technique:  Multidetector CT imaging of the head and cervical spine was performed following the standard protocol without intravenous contrast.  Multiplanar CT image reconstructions of the cervical spine were also generated.  Comparison:  None.  CT HEAD  Findings: No evidence of parenchymal hemorrhage or extra-axial fluid collection. No mass lesion, mass effect, or midline shift.  No CT evidence of acute infarction.  Subcortical white matter and periventricular small vessel ischemic changes.  Intracranial atherosclerosis.  Global cortical atrophy with secondary ventriculomegaly.  The visualized paranasal sinuses are essentially clear. The mastoid air cells are unopacified.  No evidence of calvarial  fracture.  IMPRESSION: No evidence of acute intracranial abnormality.  Atrophy with small vessel ischemic changes and intracranial atherosclerosis.  CT CERVICAL SPINE  Findings: Normal cervical lordosis.  No evidence of fracture or dislocation.  Vertebral body heights are maintained.  The dens appears intact.  No prevertebral soft tissue swelling.  Moderate multilevel degenerative  changes.  Visualized thyroid is unremarkable.  Vascular calcifications.  Right subclavian pacemaker leads, incompletely visualized.  IMPRESSION:  No evidence of traumatic injury to the cervical spine.  Moderate multilevel degenerative changes.  Original Report Authenticated By: Charline Bills, M.D.   Ct Cervical Spine Wo Contrast  05/06/2011  *RADIOLOGY REPORT*  Clinical Data:  Weakness, fall, dizziness  CT HEAD WITHOUT CONTRAST CT CERVICAL SPINE WITHOUT CONTRAST  Technique:  Multidetector CT imaging of the head and cervical spine was performed following the standard protocol without intravenous contrast.  Multiplanar CT image reconstructions of the cervical spine were also generated.  Comparison:  None.  CT HEAD  Findings: No evidence of parenchymal hemorrhage or extra-axial fluid collection. No mass lesion, mass effect, or midline shift.  No CT evidence of acute infarction.  Subcortical white matter and periventricular small vessel ischemic changes.  Intracranial atherosclerosis.  Global cortical atrophy with secondary ventriculomegaly.  The visualized paranasal sinuses are essentially clear. The mastoid air cells are unopacified.  No evidence of calvarial fracture.  IMPRESSION: No evidence of acute intracranial abnormality.  Atrophy with small vessel ischemic changes and intracranial atherosclerosis.  CT CERVICAL SPINE  Findings: Normal cervical lordosis.  No evidence of fracture or dislocation.  Vertebral body heights are maintained.  The dens appears intact.  No prevertebral soft tissue swelling.  Moderate multilevel degenerative changes.  Visualized thyroid is unremarkable.  Vascular calcifications.  Right subclavian pacemaker leads, incompletely visualized.  IMPRESSION:  No evidence of traumatic injury to the cervical spine.  Moderate multilevel degenerative changes.  Original Report Authenticated By: Charline Bills, M.D.    Assessment/Plan 1) Syncope:  At this point most likely due to orthostatic  hypotension and suspect Diabetic autonomic neuropathy. - Obtain orthostatic blood pressure checks and monitor vitals - Patient is to get pacemaker interrogated per ED PA will await results of this - Head CT was negative.  Will not order further imaging - Carotid dopplers - EKG next AM - Will order Echo to r/o other causes of syncope related to cardiac cause - No complaints of SOB, chest pain will not check chest CT angiogram at this juncture for PE - fall precautions.  2) HTN:  Currently patient has lisinopril. Given possibility of orthostatic hypotension will plan on holding for now.  3) DM:   - check CBG - Diabetic diet - Glipizide with SSI  4) Reflux:  Place patient on protonix  5) CAD/HPL: continue aspirin and zocor.  6) BPH: Continue Terazosin for now although this has been known to decrease blood pressure.  May consider discontinuing.  7) Depression/anxiety: Venlafaxine  Time Spent on Admission: >60 minutes:  PE, obtaining history, updating information services, billing, and documenting.  Penny Pia [facility] Pager: [phone] 05/06/2011, 3:16 PM

## 2011-05-06 NOTE — ED Notes (Signed)
Patient reports he has been loosing in balance.  He had a fall this morning and hit his head.  He has noted right sided facial weakness and right leg weakness.  Patient speech is clear.  No resp distress.  Patient has pain in his right hip and right knee,  States it tingles.

## 2011-05-06 NOTE — ED Provider Notes (Signed)
History     CSN: [id number]  Arrival date & time 05/06/11  1104   First MD Initiated Contact with Patient 05/06/11 1156      Chief Complaint  Patient presents with  . Weakness  . Fall    (Consider location/radiation/quality/duration/timing/severity/associated sxs/prior treatment) HPI  Patient with history of CAD, diabetes, and hypertension is present ED with chief complaints of right-sided weakness and a recent fall this a.m. Patient states for the past week he has been feeling lightheadedness with trouble balancing himself. He reports having multiple falls at home due to loss of balance. Sometimes he fell backward and sometimes he falls forward. Patient state he did hit the back of his head for fall but denies loss of consciousness. Denies any significant pain. States he just feels weak generally worsened on the right side. He denies any significant headache, fever, chest pain, shortness of breath, abdominal pain, nausea, vomiting.  He does endorse some diarrhea last week but that has resolved.  Does notice mild urinary retention and burning on urination for the past few days. States he has been eating and drinking as usual. He denies any change in medication. He denies numbness but does notice tingling sensation to both of his legs. Patient has a history of diabetes.  Pt also has pacemaker.  Pt denies taking anticoagulants.    Past Medical History  Diagnosis Date  . Hypertension   . Muscle spasm   . Diabetes mellitus   . Enlarged prostate   . CAD (coronary artery disease)   . Pacemaker     Past Surgical History  Procedure Date  . Pacemaker insertion     No family history on file.  History  Substance Use Topics  . Smoking status: Never Smoker   . Smokeless tobacco: Not on file  . Alcohol Use: No      Review of Systems  All other systems reviewed and are negative.    Allergies  Review of patient's allergies indicates no known allergies.  Home Medications    Current Outpatient Rx  Name Route Sig Dispense Refill  . ASPIRIN EC 81 MG PO TBEC Oral Take 81 mg by mouth daily.      Marland Kitchen VITAMIN D 2000 UNITS PO TABS Oral Take 2,000 Units by mouth daily.    Marland Kitchen DIAZEPAM 10 MG PO TABS Oral Take 10 mg by mouth every 6 (six) hours as needed. anxiety     . ESOMEPRAZOLE MAGNESIUM 40 MG PO CPDR Oral Take 40 mg by mouth daily before breakfast.      . FLAX PO OIL Oral Take 1 tablet by mouth daily.    Marland Kitchen GLIPIZIDE 10 MG PO TABS Oral Take 10 mg by mouth daily.      Marland Kitchen LISINOPRIL 10 MG PO TABS Oral Take 5 mg by mouth at bedtime.    . ADULT MULTIVITAMIN W/MINERALS CH Oral Take 1 tablet by mouth daily.    Marland Kitchen OVER THE COUNTER MEDICATION Oral Take 4 tablets by mouth daily. Sal [place].    Marland Kitchen OVER THE COUNTER MEDICATION Oral Take 1 tablet by mouth daily. Mega D-3    . POLYETHYLENE GLYCOL 3350 PO PACK Oral Take 17 g by mouth daily as needed. As needed for constipation.    Marland Kitchen SIMVASTATIN 40 MG PO TABS Oral Take 40 mg by mouth at bedtime.      . TERAZOSIN HCL 5 MG PO CAPS Oral Take 5 mg by mouth at bedtime.      . VENLAFAXINE  HCL ER 75 MG PO CP24 Oral Take 75 mg by mouth daily.        BP 120/53  Pulse 58  Temp(Src) 97.1 F (36.2 C) (Oral)  Resp 16  Ht 5\' 9"  (1.753 m)  Wt 192 lb (87.091 kg)  BMI 28.35 kg/m2  SpO2 100%  Physical Exam  Nursing note and vitals reviewed. Constitutional: He is oriented to person, place, and time. He appears well-developed and well-nourished. No distress.       Awake, alert, nontoxic appearance  HENT:  Head: Normocephalic and atraumatic.  Right Ear: External ear normal.  Left Ear: External ear normal.  Mouth/Throat: Oropharynx is clear and moist.       Mild tenderness noted to the occiput of scalp without overlying skin changes or swelling.  Eyes: Conjunctivae and EOM are normal. Pupils are equal, round, and reactive to light. Right eye exhibits no discharge. Left eye exhibits no discharge.       Fatigable horizontal nystagmus  Neck:  Normal range of motion. Neck supple.  Cardiovascular: Normal rate and regular rhythm.  Exam reveals no gallop and no friction rub.   No murmur heard. Pulmonary/Chest: Effort normal. No respiratory distress. He exhibits no tenderness.  Abdominal: Soft. There is no tenderness. There is no rebound.  Musculoskeletal: He exhibits no tenderness.       ROM appears intact, no obvious focal weakness  Neurological: He is alert and oriented to person, place, and time. He has normal reflexes. No cranial nerve deficit. He exhibits normal muscle tone. He displays a negative Romberg sign. Coordination normal. GCS eye subscore is 4. GCS verbal subscore is 5. GCS motor subscore is 6.       Unsteady gait, without focal neuro deficit. Romberg negative.  Skin: Skin is warm and dry. No rash noted.  Psychiatric: He has a normal mood and affect.    ED Course  Procedures (including critical care time)  Labs Reviewed - No data to display No results found.   No diagnosis found.  Results for orders placed during the hospital encounter of 05/06/11  CBC      Component Value Range   WBC 9.5  4.0 - 10.5 (K/uL)   RBC 4.36  4.22 - 5.81 (MIL/uL)   Hemoglobin 14.1  13.0 - 17.0 (g/dL)   HCT 16.1  09.6 - 04.5 (%)   MCV 96.3  78.0 - 100.0 (fL)   MCH 32.3  26.0 - 34.0 (pg)   MCHC 33.6  30.0 - 36.0 (g/dL)   RDW 40.9  81.1 - 91.4 (%)   Platelets 135 (*) 150 - 400 (K/uL)  DIFFERENTIAL      Component Value Range   Neutrophils Relative 58  43 - 77 (%)   Neutro Abs 5.5  1.7 - 7.7 (K/uL)   Lymphocytes Relative 28  12 - 46 (%)   Lymphs Abs 2.7  0.7 - 4.0 (K/uL)   Monocytes Relative 12  3 - 12 (%)   Monocytes Absolute 1.1 (*) 0.1 - 1.0 (K/uL)   Eosinophils Relative 2  0 - 5 (%)   Eosinophils Absolute 0.2  0.0 - 0.7 (K/uL)   Basophils Relative 0  0 - 1 (%)   Basophils Absolute 0.0  0.0 - 0.1 (K/uL)  URINALYSIS, ROUTINE W REFLEX MICROSCOPIC      Component Value Range   Color, Urine YELLOW  YELLOW    APPearance CLEAR   CLEAR    Specific Gravity, Urine 1.019  1.005 - 1.030  pH 5.5  5.0 - 8.0    Glucose, UA NEGATIVE  NEGATIVE (mg/dL)   Hgb urine dipstick NEGATIVE  NEGATIVE    Bilirubin Urine NEGATIVE  NEGATIVE    Ketones, ur NEGATIVE  NEGATIVE (mg/dL)   Protein, ur NEGATIVE  NEGATIVE (mg/dL)   Urobilinogen, UA 0.2  0.0 - 1.0 (mg/dL)   Nitrite NEGATIVE  NEGATIVE    Leukocytes, UA SMALL (*) NEGATIVE   POCT I-STAT, CHEM 8      Component Value Range   Sodium 144  135 - 145 (mEq/L)   Potassium 4.0  3.5 - 5.1 (mEq/L)   Chloride 106  96 - 112 (mEq/L)   BUN 18  6 - 23 (mg/dL)   Creatinine, Ser 1.61  0.50 - 1.35 (mg/dL)   Glucose, Bld 096 (*) 70 - 99 (mg/dL)   Calcium, Ion 0.45  4.09 - 1.32 (mmol/L)   TCO2 27  0 - 100 (mmol/L)   Hemoglobin 15.0  13.0 - 17.0 (g/dL)   HCT 81.1  91.4 - 78.2 (%)  POCT I-STAT TROPONIN I      Component Value Range   Troponin i, poc 0.00  0.00 - 0.08 (ng/mL)   Comment 3           URINE MICROSCOPIC-ADD ON      Component Value Range   Squamous Epithelial / LPF RARE  RARE    WBC, UA 0-2  <3 (WBC/hpf)   Dg Chest 2 View  05/06/2011  *RADIOLOGY REPORT*  Clinical Data: Multiple falls, weakness  CHEST - 2 VIEW  Comparison: 07/26/2004  Findings: Lungs are clear.  No pleural effusion or pneumothorax.  Cardiomediastinal silhouette is within normal limits.  Right subclavian pacemaker.  Degenerative changes of the visualized thoracolumbar spine.  IMPRESSION: No evidence of acute cardiopulmonary disease.  Original Report Authenticated By: Charline Bills, M.D.   Dg Hip Complete Right  05/06/2011  *RADIOLOGY REPORT*  Clinical Data: Weakness.  Fall  RIGHT HIP - COMPLETE 2+ VIEW  Comparison: None.  Findings: There is no evidence of fracture or dislocation.  There is no evidence of arthropathy or other focal bone abnormality. Soft tissues are unremarkable.  IMPRESSION: Negative exam.  Original Report Authenticated By: Rosealee Albee, M.D.   Ct Head Wo Contrast  05/06/2011  *RADIOLOGY  REPORT*  Clinical Data:  Weakness, fall, dizziness  CT HEAD WITHOUT CONTRAST CT CERVICAL SPINE WITHOUT CONTRAST  Technique:  Multidetector CT imaging of the head and cervical spine was performed following the standard protocol without intravenous contrast.  Multiplanar CT image reconstructions of the cervical spine were also generated.  Comparison:  None.  CT HEAD  Findings: No evidence of parenchymal hemorrhage or extra-axial fluid collection. No mass lesion, mass effect, or midline shift.  No CT evidence of acute infarction.  Subcortical white matter and periventricular small vessel ischemic changes.  Intracranial atherosclerosis.  Global cortical atrophy with secondary ventriculomegaly.  The visualized paranasal sinuses are essentially clear. The mastoid air cells are unopacified.  No evidence of calvarial fracture.  IMPRESSION: No evidence of acute intracranial abnormality.  Atrophy with small vessel ischemic changes and intracranial atherosclerosis.  CT CERVICAL SPINE  Findings: Normal cervical lordosis.  No evidence of fracture or dislocation.  Vertebral body heights are maintained.  The dens appears intact.  No prevertebral soft tissue swelling.  Moderate multilevel degenerative changes.  Visualized thyroid is unremarkable.  Vascular calcifications.  Right subclavian pacemaker leads, incompletely visualized.  IMPRESSION:  No evidence of traumatic injury to the cervical  spine.  Moderate multilevel degenerative changes.  Original Report Authenticated By: Charline Bills, M.D.   Ct Cervical Spine Wo Contrast  05/06/2011  *RADIOLOGY REPORT*  Clinical Data:  Weakness, fall, dizziness  CT HEAD WITHOUT CONTRAST CT CERVICAL SPINE WITHOUT CONTRAST  Technique:  Multidetector CT imaging of the head and cervical spine was performed following the standard protocol without intravenous contrast.  Multiplanar CT image reconstructions of the cervical spine were also generated.  Comparison:  None.  CT HEAD  Findings: No  evidence of parenchymal hemorrhage or extra-axial fluid collection. No mass lesion, mass effect, or midline shift.  No CT evidence of acute infarction.  Subcortical white matter and periventricular small vessel ischemic changes.  Intracranial atherosclerosis.  Global cortical atrophy with secondary ventriculomegaly.  The visualized paranasal sinuses are essentially clear. The mastoid air cells are unopacified.  No evidence of calvarial fracture.  IMPRESSION: No evidence of acute intracranial abnormality.  Atrophy with small vessel ischemic changes and intracranial atherosclerosis.  CT CERVICAL SPINE  Findings: Normal cervical lordosis.  No evidence of fracture or dislocation.  Vertebral body heights are maintained.  The dens appears intact.  No prevertebral soft tissue swelling.  Moderate multilevel degenerative changes.  Visualized thyroid is unremarkable.  Vascular calcifications.  Right subclavian pacemaker leads, incompletely visualized.  IMPRESSION:  No evidence of traumatic injury to the cervical spine.  Moderate multilevel degenerative changes.  Original Report Authenticated By: Charline Bills, M.D.     Date: 05/06/2011  Rate: 61  Rhythm: Ventricular-paced rhythm  QRS Axis: left  Intervals: paced rhythm  ST/T Wave abnormalities: pacemaker  Conduction Disutrbances:paced  Narrative Interpretation:   Old EKG Reviewed: none available and unchanged      MDM  Complaints of R sided weakness without obvious weakness on exam. No facial droops, headache, or changes in speech.  Unsteady gait on exam without focal neuro deficits.  Recurrent falls, twice yesterday and once today, will scan head, neck, obtain ECG, UA, CBC, BMP, orthostatic VS, CXR, trop.  Patient also complains of right hip pain.  Examination was unremarkable. We'll obtain right hip x-ray to rule out fracture. Discussed care with my attending.     1:58 PM Workup with essentially normal. Patient is not anemic, UA shows no evidence of  urinary tract infection, his electrolyte panels and glucose are normal. Head and neck CT shows no acute finding. Chest and right hip x-ray is essentially negative. However, due to his age and history of recurrent fall, patient would need to be admitted for further evaluation. Patient has a Scientist, research (life sciences). Pacemaker will be interrogated in the ED.  2:42 PM I have discussed care with my attending who recommend admission.  I have consulted with [facility], who agrees to admit patient to a tele bed, Team 4, under the care of Dr. Cena Benton.   Fayrene Helper, PA-C 05/06/11 1443

## 2011-05-07 DIAGNOSIS — I10 Essential (primary) hypertension: Secondary | ICD-10-CM | POA: Diagnosis not present

## 2011-05-07 DIAGNOSIS — R55 Syncope and collapse: Secondary | ICD-10-CM

## 2011-05-07 DIAGNOSIS — F411 Generalized anxiety disorder: Secondary | ICD-10-CM | POA: Diagnosis not present

## 2011-05-07 LAB — COMPREHENSIVE METABOLIC PANEL
ALT: 22 U/L (ref 0–53)
Alkaline Phosphatase: 85 U/L (ref 39–117)
CO2: 26 mEq/L (ref 19–32)
Chloride: 108 mEq/L (ref 96–112)
GFR calc Af Amer: >90 mL/min (ref >90)
GFR calc non Af Amer: 78 mL/min — ABNORMAL LOW (ref >90)
Glucose, Bld: 81 mg/dL (ref 70–99)
Potassium: 3.7 mEq/L (ref 3.5–5.1)
Sodium: 143 mEq/L (ref 135–145)

## 2011-05-07 LAB — CBC
Hemoglobin: 13.8 g/dL (ref 13.0–17.0)
RBC: 4.31 MIL/uL (ref 4.22–5.81)

## 2011-05-07 LAB — GLUCOSE, CAPILLARY: Glucose-Capillary: 84 mg/dL (ref 70–99)

## 2011-05-07 NOTE — Progress Notes (Signed)
05/07/2011 Darianne Muralles [facility] Case Management Note [phone]  Utilization review completed.  

## 2011-05-07 NOTE — Progress Notes (Signed)
*  PRELIMINARY RESULTS* Vascular Ultrasound Carotid Duplex (Doppler) has been completed.  Preliminary findings: Right= 60-79% ICA stenosis, low to mid end of scale, with antegrade vertebral flow. Left= No significant ICA stenosis with antegrade vertebral flow.  Farrel Demark RDMS 05/07/2011, 3:06 PM

## 2011-05-07 NOTE — Progress Notes (Signed)
  Echocardiogram 2D Echocardiogram has been performed.  Randall Odom 05/07/2011, 3:03 PM

## 2011-05-07 NOTE — Evaluation (Addendum)
Physical Therapy Evaluation Patient Details Name: TRAMAIN GERSHMAN MRN: [id number] DOB: 1926-12-15 Today's Date: 05/07/2011  Problem List:  Patient Active Problem List  Diagnoses  . Syncope and collapse  . Diabetes mellitus  . Pacemaker  . HTN (hypertension)  . CAD (coronary artery disease)    Past Medical History:  Past Medical History  Diagnosis Date  . Hypertension   . Muscle spasm   . Diabetes mellitus   . Enlarged prostate   . CAD (coronary artery disease)   . Pacemaker    Past Surgical History:  Past Surgical History  Procedure Date  . Pacemaker insertion     PT Assessment/Plan/Recommendation PT Assessment Clinical Impression Statement: Pt with syncope and collapse. Pt needed mod assistance for most transfers and during ambulation today. Pt states that he does not have anyone at home that can really help him when he is first D/C and he has to walk up a flight of stairs to the second floor to get to his bathroom and bedroom. Because of decreased endurance, strength and support at home, PT recommends SNF for follow up therapy at D/C.  PT Recommendation/Assessment: Patient will need skilled PT in the acute care venue PT Problem List: Decreased strength;Decreased activity tolerance;Decreased balance;Decreased mobility;Decreased coordination;Decreased safety awareness;Decreased knowledge of use of DME;Decreased knowledge of precautions Barriers to Discharge: Decreased caregiver support;Inaccessible home environment PT Therapy Diagnosis : Difficulty walking;Abnormality of gait;Generalized weakness PT Plan PT Frequency: Min 3X/week PT Treatment/Interventions: DME instruction;Gait training;Stair training;Functional mobility training;Therapeutic activities;Therapeutic exercise;Balance training;Patient/family education PT Recommendation Follow Up Recommendations: Skilled nursing facility Equipment Recommended: Defer to next venue PT Goals  Acute Rehab PT Goals PT Goal  Formulation: With patient Time For Goal Achievement: 2 weeks Pt will go Supine/Side to Sit: with modified independence PT Goal: Supine/Side to Sit - Progress: Goal set today Pt will go Sit to Stand: with supervision PT Goal: Sit to Stand - Progress: Goal set today Pt will go Stand to Sit: with supervision PT Goal: Stand to Sit - Progress: Goal set today Pt will Ambulate: 51 - 150 feet;with supervision;with least restrictive assistive device PT Goal: Ambulate - Progress: Goal set today Pt will Go Up / Down Stairs: Flight;with min assist;with least restrictive assistive device PT Goal: Up/Down Stairs - Progress: Goal set today Pt will Perform Home Exercise Program: Independently PT Goal: Perform Home Exercise Program - Progress: Goal set today  PT Evaluation Precautions/Restrictions  Precautions Precautions: Fall;ICD/Pacemaker Prior Functioning  Home Living Lives With: Spouse Receives Help From: Family Type of Home: House Home Layout: Two level Alternate Level Stairs-Rails: Right Alternate Level Stairs-Number of Steps: 12 Home Access: Level entry Bathroom Shower/Tub: Engineer, manufacturing systems: Standard Bathroom Accessibility: Yes How Accessible: Accessible via walker Home Adaptive Equipment: Straight cane;Grab bars in shower Prior Function Level of Independence: Needs assistance with homemaking;Needs assistance with tranfers;Needs assistance with gait;Needs assistance with ADLs (off balance and falling a lot needed help with ADLs) Driving: Yes (very little) Vocation: Retired Financial risk analyst Arousal/Alertness: Awake/alert Overall Cognitive Status: Appears within functional limits for tasks assessed Orientation Level: Oriented X4 Sensation/Coordination Sensation Light Touch: Appears Intact Stereognosis: Not tested Hot/Cold: Not tested Proprioception: Not tested Coordination Gross Motor Movements are Fluid and Coordinated: Yes Fine Motor Movements are Fluid and  Coordinated: Yes Extremity Assessment RUE Assessment RUE Assessment: Within Functional Limits LUE Assessment LUE Assessment: Within Functional Limits RLE Assessment RLE Assessment: Within Functional Limits LLE Assessment LLE Assessment: Within Functional Limits Orthostatics: Laying: 113/59; 64 Sitting: 109/48; 66 Standing: 97/35; 66  Standing after two minutes: 113/56; 64 Mobility (including Balance) Bed Mobility Bed Mobility: Yes Supine to Sit: 5: Supervision Supine to Sit Details (indicate cue type and reason): needing cueing on hand placement  Sitting - Scoot to Edge of Bed: 4: Min assist Sitting - Scoot to [place] of Bed Details (indicate cue type and reason): needing assistance getting R hip to EOB using pad Transfers Transfers: Yes Sit to Stand: 3: Mod assist;From bed Sit to Stand Details (indicate cue type and reason): pt needed assistance getting COM forward over BOS to stand, unsteady when on feet; x2  Stand to Sit: 4: Min assist;To bed;To chair/3-in-1 Stand to Sit Details: needing cueing on hand placement; x2  Ambulation/Gait Ambulation/Gait: Yes Ambulation/Gait Assistance: 3: Mod assist Ambulation/Gait Assistance Details (indicate cue type and reason): pt is unsteady on feet tending to lean to the L during ambulation today  Ambulation Distance (Feet): 25 Feet Assistive device: 2 person hand held assist Gait Pattern: Decreased stride length;Decreased step length - right;Decreased step length - left;Lateral trunk lean to left;Trunk flexed Gait velocity: very slow Stairs: No Wheelchair Mobility Wheelchair Mobility: No  Posture/Postural Control Posture/Postural Control: No significant limitations Balance Balance Assessed: Yes Static Standing Balance Static Standing - Balance Support: No upper extremity supported Static Standing - Level of Assistance: 3: Mod assist Static Standing - Comment/# of Minutes: pt stood for 2 minutes while obtaining orthostaics and started off  min assist but as he became tired needed more assistance to stay standing  End of Session PT - End of Session Equipment Utilized During Treatment: Gait belt Activity Tolerance: Patient tolerated treatment well;Patient limited by fatigue Patient left: in chair;with call bell in reach;Other (comment);with family/visitor present (with chair alarm set) Nurse Communication: Mobility status for transfers;Mobility status for ambulation General Behavior During Session: [facility] for tasks performed Cognition: [facility] for tasks performed  Elvera Bicker 05/07/2011, 12:49 PM

## 2011-05-07 NOTE — Progress Notes (Signed)
Subjective: After discussion with nurse patient had definitely had some recorded drop in blood pressures with changes from laying down to sitting up.  Lowest recorded blood pressure occurred when patient went from sitting to standing and his blood pressure dropped to 78/46.  Reportedly with time improved.    No acute issues overnight.  Results of Echo and Carotids pending.  Objective: Filed Vitals:   05/07/11 1206 05/07/11 1552 05/07/11 1553 05/07/11 1554  BP: 98/64 106/64 104/62 99/64  Pulse: 72 70 68 70  Temp:      TempSrc:      Resp: 18 18 18 18   Height:      Weight:      SpO2: 96% 96% 97% 95%   Weight change:   Intake/Output Summary (Last 24 hours) at 05/07/11 1910 Last data filed at 05/07/11 0700  Gross per 24 hour  Intake 1356.67 ml  Output      0 ml  Net 1356.67 ml    General: Alert, awake, oriented x3, in no acute distress. Laying supine in bed. HEENT: No bruits, no goiter.  Heart: Regular rate and rhythm, without murmurs, rubs, gallops.  Lungs: Clear to auscultation. Abdomen: Soft, nontender, nondistended, positive bowel sounds.  Neuro: Grossly intact, nonfocal.   Lab Results:  Basename 05/07/11 0600 05/06/11 1802 05/06/11 1213  NA 143 -- 144  K 3.7 -- 4.0  CL 108 -- 106  CO2 26 -- --  GLUCOSE 81 -- 127*  BUN 11 -- 18  CREATININE 0.88 0.81 --  CALCIUM 9.1 -- --  MG -- -- --  PHOS -- -- --    Basename 05/07/11 0600  AST 19  ALT 22  ALKPHOS 85  BILITOT 0.4  PROT 5.9*  ALBUMIN 3.2*   No results found for this basename: LIPASE:2,AMYLASE:2 in the last 72 hours  Basename 05/07/11 0600 05/06/11 1802 05/06/11 1156  WBC 10.7* 11.1* --  NEUTROABS -- -- 5.5  HGB 13.8 13.7 --  HCT 41.7 40.9 --  MCV 96.8 96.5 --  PLT 154 148* --   No results found for this basename: CKTOTAL:3,CKMB:3,CKMBINDEX:3,TROPONINI:3 in the last 72 hours No components found with this basename: POCBNP:3 No results found for this basename: DDIMER:2 in the last 72 hours No results  found for this basename: HGBA1C:2 in the last 72 hours No results found for this basename: CHOL:2,HDL:2,LDLCALC:2,TRIG:2,CHOLHDL:2,LDLDIRECT:2 in the last 72 hours No results found for this basename: TSH,T4TOTAL,FREET3,T3FREE,THYROIDAB in the last 72 hours No results found for this basename: VITAMINB12:2,FOLATE:2,FERRITIN:2,TIBC:2,IRON:2,RETICCTPCT:2 in the last 72 hours  Micro Results: No results found for this or any previous visit (from the past 240 hour(s)).  Studies/Results: Dg Chest 2 View  05/06/2011  *RADIOLOGY REPORT*  Clinical Data: Multiple falls, weakness  CHEST - 2 VIEW  Comparison: 07/26/2004  Findings: Lungs are clear.  No pleural effusion or pneumothorax.  Cardiomediastinal silhouette is within normal limits.  Right subclavian pacemaker.  Degenerative changes of the visualized thoracolumbar spine.  IMPRESSION: No evidence of acute cardiopulmonary disease.  Original Report Authenticated By: Charline Bills, M.D.   Dg Hip Complete Right  05/06/2011  *RADIOLOGY REPORT*  Clinical Data: Weakness.  Fall  RIGHT HIP - COMPLETE 2+ VIEW  Comparison: None.  Findings: There is no evidence of fracture or dislocation.  There is no evidence of arthropathy or other focal bone abnormality. Soft tissues are unremarkable.  IMPRESSION: Negative exam.  Original Report Authenticated By: Rosealee Albee, M.D.   Ct Head Wo Contrast  05/06/2011  *RADIOLOGY REPORT*  Clinical Data:  Weakness, fall, dizziness  CT HEAD WITHOUT CONTRAST CT CERVICAL SPINE WITHOUT CONTRAST  Technique:  Multidetector CT imaging of the head and cervical spine was performed following the standard protocol without intravenous contrast.  Multiplanar CT image reconstructions of the cervical spine were also generated.  Comparison:  None.  CT HEAD  Findings: No evidence of parenchymal hemorrhage or extra-axial fluid collection. No mass lesion, mass effect, or midline shift.  No CT evidence of acute infarction.  Subcortical white matter  and periventricular small vessel ischemic changes.  Intracranial atherosclerosis.  Global cortical atrophy with secondary ventriculomegaly.  The visualized paranasal sinuses are essentially clear. The mastoid air cells are unopacified.  No evidence of calvarial fracture.  IMPRESSION: No evidence of acute intracranial abnormality.  Atrophy with small vessel ischemic changes and intracranial atherosclerosis.  CT CERVICAL SPINE  Findings: Normal cervical lordosis.  No evidence of fracture or dislocation.  Vertebral body heights are maintained.  The dens appears intact.  No prevertebral soft tissue swelling.  Moderate multilevel degenerative changes.  Visualized thyroid is unremarkable.  Vascular calcifications.  Right subclavian pacemaker leads, incompletely visualized.  IMPRESSION:  No evidence of traumatic injury to the cervical spine.  Moderate multilevel degenerative changes.  Original Report Authenticated By: Charline Bills, M.D.   Ct Cervical Spine Wo Contrast  05/06/2011  *RADIOLOGY REPORT*  Clinical Data:  Weakness, fall, dizziness  CT HEAD WITHOUT CONTRAST CT CERVICAL SPINE WITHOUT CONTRAST  Technique:  Multidetector CT imaging of the head and cervical spine was performed following the standard protocol without intravenous contrast.  Multiplanar CT image reconstructions of the cervical spine were also generated.  Comparison:  None.  CT HEAD  Findings: No evidence of parenchymal hemorrhage or extra-axial fluid collection. No mass lesion, mass effect, or midline shift.  No CT evidence of acute infarction.  Subcortical white matter and periventricular small vessel ischemic changes.  Intracranial atherosclerosis.  Global cortical atrophy with secondary ventriculomegaly.  The visualized paranasal sinuses are essentially clear. The mastoid air cells are unopacified.  No evidence of calvarial fracture.  IMPRESSION: No evidence of acute intracranial abnormality.  Atrophy with small vessel ischemic changes and  intracranial atherosclerosis.  CT CERVICAL SPINE  Findings: Normal cervical lordosis.  No evidence of fracture or dislocation.  Vertebral body heights are maintained.  The dens appears intact.  No prevertebral soft tissue swelling.  Moderate multilevel degenerative changes.  Visualized thyroid is unremarkable.  Vascular calcifications.  Right subclavian pacemaker leads, incompletely visualized.  IMPRESSION:  No evidence of traumatic injury to the cervical spine.  Moderate multilevel degenerative changes.  Original Report Authenticated By: Charline Bills, M.D.    Medications: I have reviewed the patient's current medications.   Patient Active Hospital Problem List: Syncope and collapse (05/06/2011) Given recorded findings suspect Diabetic autonomic dysfunction currently being exhibited by orthostatic hypotension. Will hold lisinopril and hytrin and consider placing on midodrine tomorrow.  Diabetes mellitus (05/06/2011) Well controlled currently.  Pacemaker (05/06/2011) Heart rate has been monitored and continues to remain paced well.  Was supposed to be interrogated but I do not see any notes from technician.  Was assured by ED that it would get done yesterday.  But given that patient routinely gets it checked as outpatient then suspect that syncope is likely more related to orthostatic hypotension.  HTN (hypertension) (05/06/2011) Held lisinopril secondary to # 1.  At this point will plan on holding Hytrin as well.  CAD (coronary artery disease) (05/06/2011) Stable currently.  Disposition:  Pending results  of Echo and carotids.  Patient may need tilt table testing as outpatient.  Would consider adding midodrine tomorrow.   LOS: 1 day   Penny Pia M.D.  [facility] 05/07/2011, 7:10 PM

## 2011-05-07 NOTE — Progress Notes (Signed)
OT Cancellation Note  Treatment cancelled today due to patient's refusal to participate due to patient just getting back to bed.  OT spoke with patient and family regarding possible need for SNF.  Pt agreeable to options.  Pt agreed for OT to return next day  Jauan Wohl, [place] 05/07/2011, 2:08 PM

## 2011-05-07 NOTE — Evaluation (Signed)
Randall Odom,PT Acute Rehabilitation [phone] (pager)  

## 2011-05-08 DIAGNOSIS — R55 Syncope and collapse: Secondary | ICD-10-CM | POA: Diagnosis not present

## 2011-05-08 DIAGNOSIS — I6529 Occlusion and stenosis of unspecified carotid artery: Secondary | ICD-10-CM

## 2011-05-08 DIAGNOSIS — I10 Essential (primary) hypertension: Secondary | ICD-10-CM | POA: Diagnosis not present

## 2011-05-08 DIAGNOSIS — F411 Generalized anxiety disorder: Secondary | ICD-10-CM | POA: Diagnosis not present

## 2011-05-08 LAB — GLUCOSE, CAPILLARY
Glucose-Capillary: 190 mg/dL — ABNORMAL HIGH (ref 70–99)
Glucose-Capillary: 66 mg/dL — ABNORMAL LOW (ref 70–99)
Glucose-Capillary: 63 mg/dL — ABNORMAL LOW (ref 70–99)
Glucose-Capillary: 107 mg/dL — ABNORMAL HIGH (ref 70–99)

## 2011-05-08 MED ORDER — ZOLPIDEM TARTRATE 5 MG PO TABS
5.0000 mg | ORAL_TABLET | Freq: Once | ORAL | Status: AC
  Administered 2011-05-08: 5 mg via ORAL
  Filled 2011-05-08: qty 1

## 2011-05-08 NOTE — Progress Notes (Signed)
Physical Therapy Treatment Patient Details Name: Randall Odom MRN: [id number] DOB: [birth year] Today's Date: 05/08/2011  PT Assessment/Plan  PT - Assessment/Plan Comments on Treatment Session: Progressing slowly with PT goals.   PT Plan: Discharge plan remains appropriate PT Frequency: Min 3X/week Follow Up Recommendations: Skilled nursing facility Equipment Recommended: Defer to next venue PT Goals  Acute Rehab PT Goals PT Goal: Supine/Side to Sit - Progress: Not met PT Goal: Sit to Stand - Progress: Not met PT Goal: Stand to Sit - Progress: Not met PT Goal: Ambulate - Progress: Not met PT Goal: Perform Home Exercise Program - Progress: Not met  PT Treatment Precautions/Restrictions  Precautions Precautions: Fall;ICD/Pacemaker Mobility (including Balance) Bed Mobility Bed Mobility: Yes Supine to Sit: 5: Supervision;HOB flat Supine to Sit Details (indicate cue type and reason): Cues for hand placement & use of UE's to increase ease of transition.   Sitting - Scoot to Edge of Bed: 4: Min assist Sitting - Scoot to [place] of Bed Details (indicate cue type and reason): Cues for lateral weight shifting to scoot hips closer & use of UE's.  (A) to bring hips closer to EOB. Transfers Sit to Stand: 3: Mod assist;With upper extremity assist;From bed;From chair/3-in-1;With armrests Sit to Stand Details (indicate cue type and reason): (A) to achieve standing, balance, anterior translation of trunk over BOS due to posterior lean, stabilize RW due to pt pulling back & front wheel of RW coming up off ground.  Performed 4x's.   Stand to Sit: 4: Min assist;With upper extremity assist;To bed;To chair/3-in-1;With armrests Stand to Sit Details: (A) to control descent & safety.  Cues for hand placement & use of UE's to control descent.   Ambulation/Gait Ambulation/Gait Assistance: 3: Mod assist Ambulation/Gait Assistance Details (indicate cue type and reason): Used RW.  Pt very unsteady.  shuffle  steps.  Max cues for safe use of RW & technique.  Pt with narrow BOS stepping very close to midline.  Required increased (A) for turns for balance & RW management.   Ambulation Distance (Feet): 25 Feet Assistive device: Rolling walker Gait Pattern: Shuffle;Decreased step length - right;Decreased step length - left;Decreased hip/knee flexion - right;Decreased hip/knee flexion - left Stairs: No Wheelchair Mobility Wheelchair Mobility: No  Posture/Postural Control Posture/Postural Control: No significant limitations Exercise  General Exercises - Lower Extremity Ankle Circles/Pumps: AROM;Both;10 reps;Supine Long Arc Quad: AROM;Strengthening;10 reps;Both;Seated Heel Slides: AAROM;Strengthening;Both;10 reps;Supine End of Session PT - End of Session Equipment Utilized During Treatment: Gait belt Activity Tolerance: Patient tolerated treatment well Patient left: in chair;with call bell in reach;with family/visitor present General Behavior During Session: [facility] for tasks performed Cognition: [facility] for tasks performed  Lara Mulch 05/08/2011, 1:34 PM [phone]

## 2011-05-08 NOTE — Consult Note (Signed)
[facility] [place] ADMIT/CONSULT NOTE 05/08/2011 [id number] [phone] CC: He has fallen 2 times on Sunday and once this past Monday.  His wife called their doctor and was told to come to the hospital for work ups.  History of Present Illness::Pt is an 78 y/o CM with HTN, DM (~ 15 years on oral hypoglycemic medication), s/p placemaker placement that is presenting to the ED complaining of near syncope which has been a recurrent problem for the patient. Mentions that he has had this problem for the last 2 years but mentions that within the past he may have felt dizzy and that would resolved within 5 minutes or less. What is different now is that it has been associated with falls x 2. He states that he hit his head with the last episode. Mentions that when he tried to get out of bed and then he fell forward. Episodes are related to positional changes and last several minutes but self resolve. States that his vision has blacked out while these episodes are occuring and self resolved in seconds. Wife and patient report that he has not had any seizure like activity.  States that his pacemaker was "checked" 3 weeks ago. Reportedly by ED PA patient's pacemaker is to get interrogated while in the ED today. Patient denies any recent upper respiratory infections, ringing in his ears, fever or chills.    Work up shows right carotid stenosis Referring Physician:Dr.Singh    Past Medical History  Diagnosis Date  . Hypertension   . Muscle spasm   . Diabetes mellitus   . Enlarged prostate   . CAD (coronary artery disease)   . Pacemaker     Past Surgical History  Procedure Date  . Pacemaker insertion      ROS: [x]  Positive  [ ]  Denies    General: [ ]  Weight loss, [ ]  Fever, [ ]  chills Neurologic: [ ]  Dizziness, [ ]  Blackouts, [ ]  Seizure [ ]  Stroke, [ ]  "Mini stroke", [ ]  Slurred speech, [ ]  Temporary blindness; [ ]  weakness in arms or legs, [ ]  Hoarseness Cardiac: [ ]  Chest  pain/pressure, [x ] Shortness of breath at rest [x ] Shortness of breath with exertion, [ ]  Atrial fibrillation or irregular heartbeat Vascular: [x ] Pain in legs with walking, [ ]  Pain in legs at rest, [ ]  Pain in legs at night,  [ ]  Non-healing ulcer, [ ]  Blood clot in vein/DVT,   Pulmonary: [ ]  Home oxygen, [x ] Productive cough, [ ]  Coughing up blood, [ ]  Asthma,  [ ]  Wheezing Musculoskeletal:  [x]  Arthritis, [ ]  Low back pain, [ ]  Joint pain Hematologic: [ ]  Easy Bruising, [ ]  Anemia; [ ]  Hepatitis Gastrointestinal: [ ]  Blood in stool, [ ]  Gastroesophageal Reflux/heartburn, [ ]  Trouble swallowing Urinary: [ ]  chronic Kidney disease, [ ]  on HD - [ ]  MWF or [ ]  TTHS, [ ]  Burning with urination, [ ]  Difficulty urinating Skin: [ ]  Rashes, [ ]  Wounds Psychological: [ ]  Anxiety, [ ]  Depression  Social History History  Substance Use Topics  . Smoking status: Never Smoker   . Smokeless tobacco: Not on file  . Alcohol Use: No    Family History No family history on file.  No Known Allergies  Current Facility-Administered Medications  Medication Dose Route Frequency Provider Last Rate Last Dose  . 0.9 %  sodium chloride infusion  250 mL Intravenous PRN Penny Pia, MD      .  acetaminophen (TYLENOL) tablet 650 mg  650 mg Oral Q6H PRN Penny Pia, MD       Or  . acetaminophen (TYLENOL) suppository 650 mg  650 mg Rectal Q6H PRN Penny Pia, MD      . aspirin EC tablet 81 mg  81 mg Oral Daily Penny Pia, MD   81 mg at 05/08/11 1029  . diazepam (VALIUM) tablet 5 mg  5 mg Oral Q6H PRN Penny Pia, MD   5 mg at 05/07/11 2238  . glipiZIDE (GLUCOTROL) tablet 10 mg  10 mg Oral Daily Penny Pia, MD   10 mg at 05/08/11 1029  . heparin injection 5,000 Units  5,000 Units Subcutaneous Q8H Penny Pia, MD   5,000 Units at 05/08/11 1330  . insulin aspart (novoLOG) injection 0-15 Units  0-15 Units Subcutaneous TID WC Penny Pia, MD   3 Units at 05/08/11 1212  . mulitivitamin with minerals  tablet 1 tablet  1 tablet Oral Daily Penny Pia, MD   1 tablet at 05/08/11 1029  . pantoprazole (PROTONIX) EC tablet 40 mg  40 mg Oral Daily Penny Pia, MD   40 mg at 05/08/11 1029  . simvastatin (ZOCOR) tablet 40 mg  40 mg Oral QHS Penny Pia, MD   40 mg at 05/07/11 2235  . sodium chloride 0.9 % injection 3 mL  3 mL Intravenous Q12H Penny Pia, MD   3 mL at 05/08/11 1030  . sodium chloride 0.9 % injection 3 mL  3 mL Intravenous PRN Penny Pia, MD      . venlafaxine (EFFEXOR-XR) 24 hr capsule 75 mg  75 mg Oral Daily Penny Pia, MD   75 mg at 05/08/11 1029  . DISCONTD: terazosin (HYTRIN) capsule 5 mg  5 mg Oral QHS Penny Pia, MD   5 mg at 05/06/11 2219     Imaging: No results found.  Significant Diagnostic Studies: CBC Lab Results  Component Value Date   WBC 10.7* 05/07/2011   HGB 13.8 05/07/2011   HCT 41.7 05/07/2011   MCV 96.8 05/07/2011   PLT 154 05/07/2011    BMET    Component Value Date/Time   NA 143 05/07/2011 0600   K 3.7 05/07/2011 0600   CL 108 05/07/2011 0600   CO2 26 05/07/2011 0600   GLUCOSE 81 05/07/2011 0600   BUN 11 05/07/2011 0600   CREATININE 0.88 05/07/2011 0600   CALCIUM 9.1 05/07/2011 0600   GFRNONAA 78* 05/07/2011 0600   GFRAA >90 05/07/2011 0600    COAG No results found for this basename: INR, PROTIME   No results found for this basename: PTT     Physical Examination  Patient Vitals for the past 24 hrs:  BP Temp Temp src Pulse Resp SpO2  05/08/11 1400 118/66 mmHg - - 68  - -  05/08/11 1358 122/62 mmHg - - 70  - -  05/08/11 1355 120/64 mmHg - - 68  18  94 %  05/08/11 1006 109/66 mmHg - - 69  - -  05/08/11 1003 118/72 mmHg - - 77  - -  05/08/11 1000 111/68 mmHg - - 72  18  98 %  05/08/11 0945 131/76 mmHg - - - - 97 %  05/08/11 0610 111/66 mmHg - - 60  - -  05/08/11 0606 124/75 mmHg - - 68  - -  05/08/11 0603 113/69 mmHg - - 65  - -  05/08/11 0600 131/72 mmHg 97.8 F (36.6 C) Oral 67  18  97 %  05/07/11 2210 123/70 mmHg - - 70  - -    05/07/11 2206 120/68 mmHg - - 75  - -  05/07/11 2203 111/67 mmHg - - 67  - -  05/07/11 2200 123/74 mmHg 98.5 F (36.9 C) Oral 66  20  94 %  05/07/11 2100 125/59 mmHg 98.5 F (36.9 C) Oral 68  18  96 %  05/07/11 1554 99/64 mmHg - - 70  18  95 %  05/07/11 1553 104/62 mmHg - - 68  18  97 %  05/07/11 1552 106/64 mmHg - - 70  18  96 %   Pulse Readings from Last 3 Encounters:  05/08/11 68  02/11/11 59    Vascular Ultrasound  Carotid Duplex (Doppler) has been completed. Preliminary findings: Right= 60-79% ICA stenosis, low to mid end of scale, with antegrade vertebral flow. Left= No significant ICA stenosis with antegrade vertebral flow.   Ct Head Wo Contrast  05/06/2011 *RADIOLOGY REPORT* Clinical Data: Weakness, fall, dizziness CT HEAD WITHOUT CONTRAST CT CERVICAL SPINE WITHOUT CONTRAST Technique: Multidetector CT imaging of the head and cervical spine was performed following the standard protocol without intravenous contrast. Multiplanar CT image reconstructions of the cervical spine were also generated. Comparison: None. CT HEAD Findings: No evidence of parenchymal hemorrhage or extra-axial fluid collection. No mass lesion, mass effect, or midline shift. No CT evidence of acute infarction. Subcortical white matter and periventricular small vessel ischemic changes. Intracranial atherosclerosis. Global cortical atrophy with secondary ventriculomegaly. The visualized paranasal sinuses are essentially clear. The mastoid air cells are unopacified. No evidence of calvarial fracture. IMPRESSION: No evidence of acute intracranial abnormality. Atrophy with small vessel ischemic changes and intracranial atherosclerosis. CT CERVICAL SPINE Findings: Normal cervical lordosis. No evidence of fracture or dislocation. Vertebral body heights are maintained. The dens appears intact. No prevertebral soft tissue swelling. Moderate multilevel degenerative changes. Visualized thyroid is unremarkable. Vascular  calcifications. Right subclavian pacemaker leads, incompletely visualized. IMPRESSION: No evidence of traumatic injury to the cervical spine. Moderate multilevel degenerative changes. Original Report Authenticated By: Charline Bills, M.D.      General:  WDWN in NAD Gait: Normal HENT: WNL Eyes: Pupils equal Pulmonary: normal non-labored breathing , without Rales, rhonchi,  wheezing Cardiac: RRR, without  Murmurs, rubs or gallops; No carotid bruits Abdomen: soft, NT, no masses Skin: no rashes, ulcers noted Vascular Exam/Pulses:Palp Dp/PT and pop pulses left LE, palp POP right LE.  N/V/M intact Bil. LE.  N/V/M bil. UE.  Extremities without ischemic changes, no Gangrene , no cellulitis; no open wounds;  Musculoskeletal: no muscle wasting or atrophy  Neurologic: A&O X 3; Appropriate Affect ;  SENSATION: normal; MOTOR FUNCTION:  moving all extremities equally.  Speech is fluent/normal    ASSESSMENT:Carotid stenosis right side. PLAN:  Will discuss with Dr. Hart [place].  Patient seen and examined. Symptoms not consistent with lateralizing TIA. He has had near syncope on a few occasions. His neurologic exam is completely normal. Carotid duplex exam reviewed. Right internal carotid stenosis approximates only 60-70% in severity. Left internal carotid with no flow reduction. Do not believe that carotid disease is the culprit for his episodes. Should have repeat carotid duplex exam in one year to follow moderate stenosis on the right side. Will arrange for followup in our office for that study. No need for further evaluation of vasculature at this time.

## 2011-05-08 NOTE — Evaluation (Signed)
Occupational Therapy Evaluation Patient Details Name: Randall Odom MRN: [id number] DOB: 1926/11/28 Today's Date: 05/08/2011  Problem List:  Patient Active Problem List  Diagnoses  . Syncope and collapse  . Diabetes mellitus  . Pacemaker  . HTN (hypertension)  . CAD (coronary artery disease)    Past Medical History:  Past Medical History  Diagnosis Date  . Hypertension   . Muscle spasm   . Diabetes mellitus   . Enlarged prostate   . CAD (coronary artery disease)   . Pacemaker    Past Surgical History:  Past Surgical History  Procedure Date  . Pacemaker insertion     OT Assessment/Plan/Recommendation OT Assessment Clinical Impression Statement: Pt presents with dx syncope w/ falls as well as overall weakness/deconditioning and diabetic neuropathy LE's affecting safety with functional mobility for ADL's and self care. He may benefit from RW use as well as acute OT followed by SNF Rehab prior to return home. Discussed SNF w/ pt today & he verbalized agreement w/ this. OT Recommendation/Assessment: Patient will need skilled OT in the acute care venue OT Problem List: Decreased strength;Decreased activity tolerance;Impaired balance (sitting and/or standing);Decreased knowledge of use of DME or AE;Decreased safety awareness;Cardiopulmonary status limiting activity;Decreased knowledge of precautions OT Therapy Diagnosis : Generalized weakness OT Plan OT Frequency: Min 2X/week OT Treatment/Interventions: Self-care/ADL training;Therapeutic activities;Therapeutic exercise;Energy conservation;DME and/or AE instruction;Patient/family education OT Recommendation Follow Up Recommendations: Skilled nursing facility Equipment Recommended: Defer to next venue Individuals Consulted Consulted and Agree with Results and Recommendations: Patient OT Goals Acute Rehab OT Goals OT Goal Formulation: With patient Time For Goal Achievement: 2 weeks ADL Goals Pt Will Perform Grooming: with  supervision;with set-up;Standing at sink;Sitting at sink ADL Goal: Grooming - Progress: Goal set today Pt Will Perform Upper Body Bathing: with supervision;with set-up;Sitting at sink;Standing at sink ADL Goal: Upper Body Bathing - Progress: Goal set today Pt Will Perform Lower Body Bathing: with set-up;with min assist;Sitting at sink;Standing at sink;Sit to stand from chair ADL Goal: Lower Body Bathing - Progress: Goal set today Pt Will Perform Upper Body Dressing: with modified independence;Sitting, chair;Sitting, bed ADL Goal: Upper Body Dressing - Progress: Goal set today Pt Will Perform Lower Body Dressing: with supervision;Sit to stand from bed;Sit to stand from chair ADL Goal: Lower Body Dressing - Progress: Goal set today Pt Will Transfer to Toilet: with supervision;with DME;Ambulation;3-in-1 ADL Goal: Toilet Transfer - Progress: Goal set today Pt Will Perform Toileting - Clothing Manipulation: with supervision;Sitting on 3-in-1 or toilet;Standing ADL Goal: Toileting - Clothing Manipulation - Progress: Goal set today Pt Will Perform Toileting - Hygiene: with modified independence;Sitting on 3-in-1 or toilet ADL Goal: Toileting - Hygiene - Progress: Goal set today  OT Evaluation Precautions/Restrictions  Precautions Precautions: Fall;ICD/Pacemaker Prior Functioning Home Living Lives With: Spouse Receives Help From: Family Type of Home: House Home Layout: Two level Alternate Level Stairs-Rails: Right Alternate Level Stairs-Number of Steps: 12 Home Access: Level entry Bathroom Shower/Tub: Engineer, manufacturing systems: Standard Bathroom Accessibility: Yes How Accessible: Accessible via walker Home Adaptive Equipment: Straight cane;Grab bars in shower;Built-in shower seat Prior Function Level of Independence: Needs assistance with homemaking;Needs assistance with tranfers;Needs assistance with gait;Needs assistance with ADLs (off balance and falling a lot needed help with  ADLs) Driving: Yes (very little) Vocation: Retired ADL ADL Eating/Feeding: Simulated;Modified independent Where Assessed - Eating/Feeding: Bed level Grooming: Simulated;Set up;Minimal assistance Grooming Details (indicate cue type and reason): gather items Where Assessed - Grooming: Sitting, bed;Unsupported Upper Body Bathing: Simulated;Minimal assistance Where Assessed - Upper  Body Bathing: Sitting, bed Lower Body Bathing: Simulated;Moderate assistance;Maximal assistance Lower Body Bathing Details (indicate cue type and reason): difficulty following cues to bend knees/cross legs for simulated bathing task Where Assessed - Lower Body Bathing: Sitting, bed;Supine, head of bed up Upper Body Dressing: Simulated;Set up;Supervision/safety Upper Body Dressing Details (indicate cue type and reason): Increased time for tasks and assist gathering items Where Assessed - Upper Body Dressing: Supine, head of bed up;Sitting, bed Lower Body Dressing: Simulated;Maximal assistance Lower Body Dressing Details (indicate cue type and reason): Increased time for all tasks, pt difficulty shifting weight and trunk mobility/flexibility, with noted LOB sit at EOB requiring supervision - Mod assist to correct. Where Assessed - Lower Body Dressing: Supine, head of bed up;Sitting, bed;Unsupported Toilet Transfer: Simulated;Moderate assistance;Maximal assistance Toilet Transfer Details (indicate cue type and reason): Pt states, "My legs are real weak and they just give out on me" Overall deconditioning and noted neuropathy bilat LE's affecting safe transfers and functional mobility Toilet Transfer Method: Ambulating;Stand pivot Toilet Transfer Equipment: Bedside commode Toileting - Clothing Manipulation: Not assessed Toileting - Hygiene: Not assessed Tub/Shower Transfer: Not assessed ADL Comments: Pt presents with dx syncope w/ falls as well as overall weakness/deconditioning and diabetic neuropathy LE's affecting  safety with functional mobility for ADL's and self care. He may benefit from RW use as well as acute OT followed by SNF Rehab prior to return home. Discussed SNF w/ pt today & he verbalized agreement w/ this. Vision/Perception  Vision - History Baseline Vision: Wears glasses all the time Patient Visual Report: No change from baseline Cognition Cognition Arousal/Alertness: Awake/alert Overall Cognitive Status: Appears within functional limits for tasks assessed Orientation Level: Oriented X4 Sensation/Coordination Sensation Light Touch: Appears Intact Coordination Gross Motor Movements are Fluid and Coordinated: Yes Fine Motor Movements are Fluid and Coordinated: Yes Extremity Assessment RUE Assessment RUE Assessment: Within Functional Limits LUE Assessment LUE Assessment: Within Functional Limits Mobility  Bed Mobility Bed Mobility: Yes Supine to Sit: 5: Supervision Supine to Sit Details (indicate cue type and reason): VC's for hand placement, increased time for all tasks Sitting - Scoot to Edge of Bed: 4: Min assist;With rail;5: Supervision Sitting - Scoot to [place] of Bed Details (indicate cue type and reason): assist w/ pad to EOB Transfers Transfers: Yes Sit to Stand: 3: Mod assist;With upper extremity assist;With armrests;From bed;From chair/3-in-1 Sit to Stand Details (indicate cue type and reason): Pt very unsteady on feet, stating "My legs are weak and they just give out on me" with narrow BOS, required VC's for positioning Stand to Sit: 4: Min assist;To bed;To elevated surface;With upper extremity assist Stand to Sit Details: VC's & TC's for hand placement   End of Session OT - End of Session Equipment Utilized During Treatment: Gait belt;Other (comment) (HHA (rec RW)) Activity Tolerance: Patient tolerated treatment well;Patient limited by fatigue;Other (comment) (Pt declined sitting up in chair) Patient left: in bed;with call bell in reach General Behavior During  Session: [facility] for tasks performed Cognition: [facility] for tasks performed   Alm Bustard 05/08/2011, 10:39 AM

## 2011-05-08 NOTE — Progress Notes (Signed)
Randall Odom ZOX:[id number] is a 78 y.o. male,  Outpatient Primary MD for the patient is Hoyle Sauer, MD, MD  Chief Complaint  Patient presents with  . Weakness  . Fall        Subjective:   Randall Odom today has, No headache, No chest pain, No abdominal pain - No Nausea, No new weakness tingling or numbness, No Cough - SOB.    Objective:   Filed Vitals:   05/08/11 1006 05/08/11 1355 05/08/11 1358 05/08/11 1400  BP: 109/66 120/64 122/62 118/66  Pulse: 69 68 70 68  Temp:      TempSrc:      Resp:  18    Height:      Weight:      SpO2:  94%      Wt Readings from Last 3 Encounters:  05/06/11 87.091 kg (192 lb)     Intake/Output Summary (Last 24 hours) at 05/08/11 1453 Last data filed at 05/08/11 0900  Gross per 24 hour  Intake    240 ml  Output      0 ml  Net    240 ml    Exam Awake Alert, Oriented *3, No new F.N deficits, Normal affect Adin.AT,PERRAL Supple Neck,No JVD, No cervical lymphadenopathy appriciated.  Symmetrical Chest wall movement, Good air movement bilaterally, CTAB RRR,No Gallops,Rubs or new Murmurs, No Parasternal Heave +ve B.Sounds, Abd Soft, Non tender, No organomegaly appriciated, No rebound -guarding or rigidity. No Cyanosis, Clubbing or edema, No new Rash or bruise     Data Review  CBC  Lab 05/07/11 0600 05/06/11 1802 05/06/11 1213 05/06/11 1156  WBC 10.7* 11.1* -- 9.5  HGB 13.8 13.7 15.0 14.1  HCT 41.7 40.9 44.0 42.0  PLT 154 148* -- 135*  MCV 96.8 96.5 -- 96.3  MCH 32.0 32.3 -- 32.3  MCHC 33.1 33.5 -- 33.6  RDW 13.4 13.3 -- 13.3  LYMPHSABS -- -- -- 2.7  MONOABS -- -- -- 1.1*  EOSABS -- -- -- 0.2  BASOSABS -- -- -- 0.0  BANDABS -- -- -- --    Chemistries   Lab 05/07/11 0600 05/06/11 1802 05/06/11 1213  NA 143 -- 144  K 3.7 -- 4.0  CL 108 -- 106  CO2 26 -- --  GLUCOSE 81 -- 127*  BUN 11 -- 18  CREATININE 0.88 0.81 1.00  CALCIUM 9.1 -- --  MG -- -- --  AST 19 -- --  ALT 22 -- --  ALKPHOS 85 -- --    BILITOT 0.4 -- --   ------------------------------------------------------------------------------------------------------------------ estimated creatinine clearance is 70.8 ml/min (by C-G formula based on Cr of 0.88). ------------------------------------------------------------------------------------------------------------------ No results found for this basename: HGBA1C:2 in the last 72 hours ------------------------------------------------------------------------------------------------------------------ No results found for this basename: CHOL:2,HDL:2,LDLCALC:2,TRIG:2,CHOLHDL:2,LDLDIRECT:2 in the last 72 hours ------------------------------------------------------------------------------------------------------------------ No results found for this basename: TSH,T4TOTAL,FREET3,T3FREE,THYROIDAB in the last 72 hours ------------------------------------------------------------------------------------------------------------------ No results found for this basename: VITAMINB12:2,FOLATE:2,FERRITIN:2,TIBC:2,IRON:2,RETICCTPCT:2 in the last 72 hours  Coagulation profile No results found for this basename: INR:5,PROTIME:5 in the last 168 hours  No results found for this basename: DDIMER:2 in the last 72 hours  Cardiac Enzymes No results found for this basename: CK:3,CKMB:3,TROPONINI:3,MYOGLOBIN:3 in the last 168 hours ------------------------------------------------------------------------------------------------------------------ No components found with this basename: POCBNP:3  Micro Results No results found for this or any previous visit (from the past 240 hour(s)).  Radiology Reports Dg Chest 2 View  05/06/2011  *RADIOLOGY REPORT*  Clinical Data: Multiple falls, weakness  CHEST - 2 VIEW  Comparison: 07/26/2004  Findings:  Lungs are clear.  No pleural effusion or pneumothorax.  Cardiomediastinal silhouette is within normal limits.  Right subclavian pacemaker.  Degenerative changes of the  visualized thoracolumbar spine.  IMPRESSION: No evidence of acute cardiopulmonary disease.  Original Report Authenticated By: Charline Bills, M.D.   Dg Hip Complete Right  05/06/2011  *RADIOLOGY REPORT*  Clinical Data: Weakness.  Fall  RIGHT HIP - COMPLETE 2+ VIEW  Comparison: None.  Findings: There is no evidence of fracture or dislocation.  There is no evidence of arthropathy or other focal bone abnormality. Soft tissues are unremarkable.  IMPRESSION: Negative exam.  Original Report Authenticated By: Rosealee Albee, M.D.   Ct Head Wo Contrast  05/06/2011  *RADIOLOGY REPORT*  Clinical Data:  Weakness, fall, dizziness  CT HEAD WITHOUT CONTRAST CT CERVICAL SPINE WITHOUT CONTRAST  Technique:  Multidetector CT imaging of the head and cervical spine was performed following the standard protocol without intravenous contrast.  Multiplanar CT image reconstructions of the cervical spine were also generated.  Comparison:  None.  CT HEAD  Findings: No evidence of parenchymal hemorrhage or extra-axial fluid collection. No mass lesion, mass effect, or midline shift.  No CT evidence of acute infarction.  Subcortical white matter and periventricular small vessel ischemic changes.  Intracranial atherosclerosis.  Global cortical atrophy with secondary ventriculomegaly.  The visualized paranasal sinuses are essentially clear. The mastoid air cells are unopacified.  No evidence of calvarial fracture.  IMPRESSION: No evidence of acute intracranial abnormality.  Atrophy with small vessel ischemic changes and intracranial atherosclerosis.  CT CERVICAL SPINE  Findings: Normal cervical lordosis.  No evidence of fracture or dislocation.  Vertebral body heights are maintained.  The dens appears intact.  No prevertebral soft tissue swelling.  Moderate multilevel degenerative changes.  Visualized thyroid is unremarkable.  Vascular calcifications.  Right subclavian pacemaker leads, incompletely visualized.  IMPRESSION:  No evidence  of traumatic injury to the cervical spine.  Moderate multilevel degenerative changes.  Original Report Authenticated By: Charline Bills, M.D.   Ct Cervical Spine Wo Contrast  05/06/2011  *RADIOLOGY REPORT*  Clinical Data:  Weakness, fall, dizziness  CT HEAD WITHOUT CONTRAST CT CERVICAL SPINE WITHOUT CONTRAST  Technique:  Multidetector CT imaging of the head and cervical spine was performed following the standard protocol without intravenous contrast.  Multiplanar CT image reconstructions of the cervical spine were also generated.  Comparison:  None.  CT HEAD  Findings: No evidence of parenchymal hemorrhage or extra-axial fluid collection. No mass lesion, mass effect, or midline shift.  No CT evidence of acute infarction.  Subcortical white matter and periventricular small vessel ischemic changes.  Intracranial atherosclerosis.  Global cortical atrophy with secondary ventriculomegaly.  The visualized paranasal sinuses are essentially clear. The mastoid air cells are unopacified.  No evidence of calvarial fracture.  IMPRESSION: No evidence of acute intracranial abnormality.  Atrophy with small vessel ischemic changes and intracranial atherosclerosis.  CT CERVICAL SPINE  Findings: Normal cervical lordosis.  No evidence of fracture or dislocation.  Vertebral body heights are maintained.  The dens appears intact.  No prevertebral soft tissue swelling.  Moderate multilevel degenerative changes.  Visualized thyroid is unremarkable.  Vascular calcifications.  Right subclavian pacemaker leads, incompletely visualized.  IMPRESSION:  No evidence of traumatic injury to the cervical spine.  Moderate multilevel degenerative changes.  Original Report Authenticated By: Charline Bills, M.D.    Vascular Ultrasound  Carotid Duplex (Doppler) has been completed. Preliminary findings: Right= 60-79% ICA stenosis, low to mid end of scale, with antegrade vertebral  flow. Left= No significant ICA stenosis with antegrade vertebral  flow.  Echo    Scheduled Meds:   . aspirin EC  81 mg Oral Daily  . glipiZIDE  10 mg Oral Daily  . heparin  5,000 Units Subcutaneous Q8H  . insulin aspart  0-15 Units Subcutaneous TID WC  . mulitivitamin with minerals  1 tablet Oral Daily  . pantoprazole  40 mg Oral Daily  . simvastatin  40 mg Oral QHS  . sodium chloride  3 mL Intravenous Q12H  . venlafaxine  75 mg Oral Daily  . DISCONTD: terazosin  5 mg Oral QHS   Continuous Infusions:  PRN Meds:.sodium chloride, acetaminophen, acetaminophen, diazepam, sodium chloride  Assessment & Plan   Syncope and collapse (05/06/2011) Given recorded findings suspect Diabetic autonomic dysfunction currently being exhibited by orthostatic hypotension. Will hold lisinopril and hytrin , monitor Orthostatic, if still +ve will add midodrene, also Carotid US shows 60-79% Rt sided stenosis, Vas called, on ASA-Statin.   Diabetes mellitus (05/06/2011) Well controlled currently.    Pacemaker (05/06/2011) Heart rate has been monitored and continues to remain paced well. Was supposed to be interrogated but I do not see any notes from technician. Was assured by ED that it would get done yesterday. But given that patient routinely gets it checked as outpatient then suspect that syncope is likely more related to orthostatic hypotension. stable on Tele.    HTN (hypertension) (05/06/2011) Held lisinopril secondary to # 1. At this point will plan on holding Hytrin as well.   CAD (coronary artery disease) (05/06/2011) Stable currently.   DVT Prophylaxis   Heparin    See all Orders from today for further details     Leroy Sea M.D on 05/08/2011 at 2:53 PM  [facility] Office  [phone]

## 2011-05-08 NOTE — Progress Notes (Addendum)
Clinical Social Worker completed psychosocial assessment and placed in shadow chart. Clinical Social Worker met with pt and pt family at bedside to discuss pt discharge needs. Pt agreeable to SNF search in [facility]. Clinical Child psychotherapist completed FL-2 and initiated SNF search in [place]. Placement note and FL-2 can be found in shadow chart (MD please sign FL-2). Pt interested in [facility] and Clinical Social Worker contacted facility to inform of pt interest. Clinical Social Worker to follow up with pt and pt family in regard to bed offers and facilitate pt discharge needs when pt medically stable for discharge.    Late Entry Addendum- Clinical Social Worker presented pt, pt son, and pt spouse bed offers the afternoon of 05/08/11. Pt [facility] bed at [facility] and [facility]. Clinical Social Worker notified facility. Clinical Social Worker to facilitate pt discharge needs.  Jacklynn Lewis, MSW, LCSWA  Clinical Social Work [phone]

## 2011-05-08 NOTE — Progress Notes (Signed)
Patient's dinner time CBG was 63, pt asymptomatic.  15 CHO snack given and re-check CBG was 63.  Patient finished his dinner and his CBG was 81.  MD notified.  Will continue to monitor. Nolon Nations

## 2011-05-09 ENCOUNTER — Other Ambulatory Visit: Payer: Self-pay | Admitting: *Deleted

## 2011-05-09 DIAGNOSIS — R279 Unspecified lack of coordination: Secondary | ICD-10-CM | POA: Diagnosis not present

## 2011-05-09 DIAGNOSIS — F411 Generalized anxiety disorder: Secondary | ICD-10-CM | POA: Diagnosis not present

## 2011-05-09 DIAGNOSIS — D649 Anemia, unspecified: Secondary | ICD-10-CM | POA: Diagnosis not present

## 2011-05-09 DIAGNOSIS — M6281 Muscle weakness (generalized): Secondary | ICD-10-CM | POA: Diagnosis not present

## 2011-05-09 DIAGNOSIS — E119 Type 2 diabetes mellitus without complications: Secondary | ICD-10-CM | POA: Diagnosis not present

## 2011-05-09 DIAGNOSIS — E559 Vitamin D deficiency, unspecified: Secondary | ICD-10-CM | POA: Diagnosis not present

## 2011-05-09 DIAGNOSIS — R55 Syncope and collapse: Secondary | ICD-10-CM | POA: Diagnosis not present

## 2011-05-09 DIAGNOSIS — I2510 Atherosclerotic heart disease of native coronary artery without angina pectoris: Secondary | ICD-10-CM | POA: Diagnosis not present

## 2011-05-09 DIAGNOSIS — I6529 Occlusion and stenosis of unspecified carotid artery: Secondary | ICD-10-CM

## 2011-05-09 DIAGNOSIS — Z950 Presence of cardiac pacemaker: Secondary | ICD-10-CM | POA: Diagnosis not present

## 2011-05-09 DIAGNOSIS — E039 Hypothyroidism, unspecified: Secondary | ICD-10-CM | POA: Diagnosis not present

## 2011-05-09 DIAGNOSIS — Z5189 Encounter for other specified aftercare: Secondary | ICD-10-CM | POA: Diagnosis not present

## 2011-05-09 DIAGNOSIS — E785 Hyperlipidemia, unspecified: Secondary | ICD-10-CM | POA: Diagnosis not present

## 2011-05-09 DIAGNOSIS — I10 Essential (primary) hypertension: Secondary | ICD-10-CM | POA: Diagnosis not present

## 2011-05-09 DIAGNOSIS — R269 Unspecified abnormalities of gait and mobility: Secondary | ICD-10-CM | POA: Diagnosis not present

## 2011-05-09 LAB — GLUCOSE, CAPILLARY: Glucose-Capillary: 145 mg/dL — ABNORMAL HIGH (ref 70–99)

## 2011-05-09 MED ORDER — MIDODRINE HCL 2.5 MG PO TABS: 2.5000 mg | ORAL_TABLET | Freq: Three times a day (TID) | ORAL | Status: DC

## 2011-05-09 MED ORDER — GLIPIZIDE 10 MG PO TABS: 5.0000 mg | ORAL_TABLET | Freq: Every day | ORAL | Status: DC

## 2011-05-09 NOTE — Progress Notes (Signed)
Clinical Social Worker facilitated pt discharge needs including contacting facility. Family at bedside and notified of pt discharge. Pt son plans to transport pt by pt son car at discharge to [facility] and pt agreeable. Discharge packet left in [facility] for RN to provide to pt at discharge. RN aware. No further social work needs at this time. Clinical Social Worker signing off.   Jacklynn Lewis, MSW, LCSWA  Clinical Social Work [phone]

## 2011-05-09 NOTE — Discharge Summary (Signed)
Randall Odom, 78 y.o., DOB Oct 31, 1926, MRN [id number]. Admission date: 05/06/2011 Discharge Date 05/09/2011 Primary MD Hoyle Sauer, MD, MD Admitting Physician Penny Pia, MD  Admission Diagnosis  Weakness [780.79] Fall [E888.9] Weakness generalized, recent fall  Discharge Diagnosis   Principal Problem:  *Syncope and collapse Active Problems:  Diabetes mellitus  Pacemaker  HTN (hypertension)  CAD (coronary artery disease)    Past Medical History  Diagnosis Date  . Hypertension   . Muscle spasm   . Diabetes mellitus   . Enlarged prostate   . CAD (coronary artery disease)   . Pacemaker     Past Surgical History  Procedure Date  . Pacemaker insertion      Hospital Course See H&P, Labs, Consult and Test reports for all details in brief, patient was admitted for   Syncope and collapse (05/06/2011) Given recorded findings suspect Diabetic autonomic dysfunction causing orthostatic hypotension. Much better after stopped lisinopril and hytrin , not Orthostatic now, if still +ve in the future for orthostatic hypotension consider  adding midodrine. Needs PT for deconditioning and baseline weakness.  Carotid Artery Stenosis - on ASA and Statin, seen by Dr Hart [place], outpt follow up, likely incidental finding.   Diabetes mellitus (05/06/2011) Well controlled currently. Sugars on DM diet running <90, lowered Glucotrol, monitor sugars and adjust dose if needed.   Pacemaker (05/06/2011) Heart rate has been monitored and continues to remain paced well. Outpt cards follow up in 1-2 weeks, stable on Tele.    HTN (hypertension) (05/06/2011) Held lisinopril secondary to # 1. At this point will plan on holding Hytrin as well.   CAD (coronary artery disease) (05/06/2011) Stable currently. No Symptoms.    Consults Vas Surgery  Significant Tests:  See full reports for all details     Vascular Ultrasound  Carotid Duplex (Doppler) has been completed. Preliminary findings: Right=  60-79% ICA stenosis, low to mid end of scale, with antegrade vertebral flow. Left= No significant ICA stenosis with antegrade vertebral flow.   Echo  Study Conclusions  - Left ventricle: The cavity size was normal. Systolic function was normal. The estimated ejection fraction was in the range of 60% to 65%. Although no diagnostic regional wall motion abnormality was identified, this possibility cannot be completely excluded on the basis of this study. - Aortic valve: Mild regurgitation. - Mitral valve: Calcified annulus. - Left atrium: The atrium was moderately dilated. - Right ventricle: The cavity size was mildly dilated. Wall thickness was normal.     Dg Chest 2 View  05/06/2011  *RADIOLOGY REPORT*  Clinical Data: Multiple falls, weakness  CHEST - 2 VIEW  Comparison: 07/26/2004  Findings: Lungs are clear.  No pleural effusion or pneumothorax.  Cardiomediastinal silhouette is within normal limits.  Right subclavian pacemaker.  Degenerative changes of the visualized thoracolumbar spine.  IMPRESSION: No evidence of acute cardiopulmonary disease.  Original Report Authenticated By: Charline Bills, M.D.   Dg Hip Complete Right  05/06/2011  *RADIOLOGY REPORT*  Clinical Data: Weakness.  Fall  RIGHT HIP - COMPLETE 2+ VIEW  Comparison: None.  Findings: There is no evidence of fracture or dislocation.  There is no evidence of arthropathy or other focal bone abnormality. Soft tissues are unremarkable.  IMPRESSION: Negative exam.  Original Report Authenticated By: Rosealee Albee, M.D.   Ct Head Wo Contrast  05/06/2011  *RADIOLOGY REPORT*  Clinical Data:  Weakness, fall, dizziness  CT HEAD WITHOUT CONTRAST CT CERVICAL SPINE WITHOUT CONTRAST  Technique:  Multidetector CT imaging of the head  and cervical spine was performed following the standard protocol without intravenous contrast.  Multiplanar CT image reconstructions of the cervical spine were also generated.  Comparison:  None.  CT HEAD   Findings: No evidence of parenchymal hemorrhage or extra-axial fluid collection. No mass lesion, mass effect, or midline shift.  No CT evidence of acute infarction.  Subcortical white matter and periventricular small vessel ischemic changes.  Intracranial atherosclerosis.  Global cortical atrophy with secondary ventriculomegaly.  The visualized paranasal sinuses are essentially clear. The mastoid air cells are unopacified.  No evidence of calvarial fracture.  IMPRESSION: No evidence of acute intracranial abnormality.  Atrophy with small vessel ischemic changes and intracranial atherosclerosis.  CT CERVICAL SPINE  Findings: Normal cervical lordosis.  No evidence of fracture or dislocation.  Vertebral body heights are maintained.  The dens appears intact.  No prevertebral soft tissue swelling.  Moderate multilevel degenerative changes.  Visualized thyroid is unremarkable.  Vascular calcifications.  Right subclavian pacemaker leads, incompletely visualized.  IMPRESSION:  No evidence of traumatic injury to the cervical spine.  Moderate multilevel degenerative changes.  Original Report Authenticated By: Charline Bills, M.D.   Ct Cervical Spine Wo Contrast  05/06/2011  *RADIOLOGY REPORT*  Clinical Data:  Weakness, fall, dizziness  CT HEAD WITHOUT CONTRAST CT CERVICAL SPINE WITHOUT CONTRAST  Technique:  Multidetector CT imaging of the head and cervical spine was performed following the standard protocol without intravenous contrast.  Multiplanar CT image reconstructions of the cervical spine were also generated.  Comparison:  None.  CT HEAD  Findings: No evidence of parenchymal hemorrhage or extra-axial fluid collection. No mass lesion, mass effect, or midline shift.  No CT evidence of acute infarction.  Subcortical white matter and periventricular small vessel ischemic changes.  Intracranial atherosclerosis.  Global cortical atrophy with secondary ventriculomegaly.  The visualized paranasal sinuses are essentially  clear. The mastoid air cells are unopacified.  No evidence of calvarial fracture.  IMPRESSION: No evidence of acute intracranial abnormality.  Atrophy with small vessel ischemic changes and intracranial atherosclerosis.  CT CERVICAL SPINE  Findings: Normal cervical lordosis.  No evidence of fracture or dislocation.  Vertebral body heights are maintained.  The dens appears intact.  No prevertebral soft tissue swelling.  Moderate multilevel degenerative changes.  Visualized thyroid is unremarkable.  Vascular calcifications.  Right subclavian pacemaker leads, incompletely visualized.  IMPRESSION:  No evidence of traumatic injury to the cervical spine.  Moderate multilevel degenerative changes.  Original Report Authenticated By: Charline Bills, M.D.     Today   Subjective:   Randall Odom today has no headache,no chest abdominal pain,no new weakness tingling or numbness, feels much better wants to go home today.    Objective:   Blood pressure 111/69, pulse 66, temperature 97.7 F (36.5 C), temperature source Oral, resp. rate 20, height 5\' 9"  (1.753 m), weight 87.091 kg (192 lb), SpO2 97.00%. No intake or output data in the 24 hours ending 05/09/11 1236  Exam Awake Alert, Oriented *3, No new F.N deficits, Normal affect [place].AT,PERRAL Supple Neck,No JVD, No cervical lymphadenopathy appriciated.  Symmetrical Chest wall movement, Good air movement bilaterally, CTAB RRR,No Gallops,Rubs or new Murmurs, No Parasternal Heave +ve B.Sounds, Abd Soft, Non tender, No organomegaly appriciated, No rebound -guarding or rigidity. No Cyanosis, Clubbing or edema, No new Rash or bruise  Data Review      CBC w Diff:  Lab Results  Component Value Date   WBC 10.7* 05/07/2011   HGB 13.8 05/07/2011   HCT 41.7 05/07/2011  PLT 154 05/07/2011   LYMPHOPCT 28 05/06/2011   MONOPCT 12 05/06/2011   EOSPCT 2 05/06/2011   BASOPCT 0 05/06/2011   CMP:  Lab Results  Component Value Date   NA 143 05/07/2011   K 3.7  05/07/2011   CL 108 05/07/2011   CO2 26 05/07/2011   BUN 11 05/07/2011   CREATININE 0.88 05/07/2011   PROT 5.9* 05/07/2011   ALBUMIN 3.2* 05/07/2011   BILITOT 0.4 05/07/2011   ALKPHOS 85 05/07/2011   AST 19 05/07/2011   ALT 22 05/07/2011  .  Micro Results No results found for this or any previous visit (from the past 240 hour(s)).   Discharge Instructions      Follow with Primary MD Hoyle Sauer, MD, MD in 7 days   Get CBC, CMP, checked 7 days by Primary MD and again as instructed by your Primary MD.   Get Medicines reviewed and adjusted.  Please request your Prim.MD to go over all Hospital Tests and Procedure/Radiological results at the follow up, please get all Hospital records sent to your Prim MD by signing hospital release before you go home.  Follow-up Information    Follow up with Hoyle Sauer, MD. Schedule an appointment as soon as possible for a visit in 1 week.      Follow up with Josephina Gip, MD. Schedule an appointment as soon as possible for a visit in 2 months.   Contact information:   [address] [place] [address] [phone]            Activity: Fall precautions use walker/cane & assistance as needed  Diet: Diabetic-Low Sodium, Aspiration precautions.  For Heart failure patients - Check your Weight same time everyday, if you gain over 2 pounds, or you develop in leg swelling, experience more shortness of breath or chest pain, call your Primary MD immediately. Follow Cardiac Low Salt Diet and 1.8 lit/day fluid restriction.  Disposition Home  If you experience worsening of your admission symptoms, develop shortness of breath, life threatening emergency, suicidal or homicidal thoughts you must seek medical attention immediately by calling 911 or calling your MD immediately  if symptoms less severe.  You Must read complete instructions/literature along with all the possible adverse reactions/side effects for all the Medicines you take  and that have been prescribed to you. Take any new Medicines after you have completely understood and accpet all the possible adverse reactions/side effects.   Do not drive if your were admitted for syncope or siezures until you have seen by Primary MD or a Neurologist and advised to drive.  Do not drive when taking Pain medications.    Do not take more than prescribed Pain, Sleep and Anxiety Medications  Special Instructions: If you have smoked or chewed Tobacco  in the last 2 yrs please stop smoking, stop any regular Alcohol  and or any Recreational drug use.  Wear Seat belts while driving.  Follow-up Information    Follow up with Hoyle Sauer, MD. Schedule an appointment as soon as possible for a visit in 1 week.      Follow up with Josephina Gip, MD. Schedule an appointment as soon as possible for a visit in 2 months.   Contact information:   [address] [place] [address] [phone]          Discharge Medications   Medication List  As of 05/09/2011 12:36 PM   CHANGE how you take these medications  glipiZIDE 10 MG tablet   Commonly known as: GLUCOTROL   Take 0.5 tablets (5 mg total) by mouth daily.   What changed: dose         CONTINUE taking these medications         aspirin EC 81 MG tablet      diazepam 10 MG tablet   Commonly known as: VALIUM      esomeprazole 40 MG capsule   Commonly known as: NEXIUM      Flax Oil      mulitivitamin with minerals Tabs      OVER THE COUNTER MEDICATION      OVER THE COUNTER MEDICATION      polyethylene glycol packet   Commonly known as: MIRALAX / GLYCOLAX      simvastatin 40 MG tablet   Commonly known as: ZOCOR      venlafaxine 75 MG 24 hr capsule   Commonly known as: EFFEXOR-XR      Vitamin D 2000 UNITS tablet         STOP taking these medications         lisinopril 10 MG tablet      terazosin 5 MG capsule          Where to get your medications    These are the  prescriptions that you need to pick up. We sent them to a specific pharmacy, so you will need to go there to get them.   [id number] [place], [place] - [address]    [address] [place] [address]    Phone: [phone]        glipiZIDE 10 MG tablet             Total Time in preparing paper work, data evaluation and todays exam - 35 minutes  Leroy Sea M.D on 05/09/2011 at 12:36 PM  [facility] Office  [phone]

## 2011-05-09 NOTE — Progress Notes (Signed)
Inpatient Diabetes Program Recommendations  AACE/ADA: New Consensus Statement on Inpatient Glycemic Control (2009)  Target Ranges:  Prepandial:   less than 140 mg/dL      Peak postprandial:   less than 180 mg/dL (1-2 hours)      Critically ill patients:  140 - 180 mg/dL  Results for JAGGAR, BENKO (MRN [id number]) as of 05/09/2011 11:23  Ref. Range 05/08/2011 07:34 05/08/2011 11:16 05/08/2011 16:23 05/08/2011 16:58 05/08/2011 17:34 05/08/2011 21:50  Glucose-Capillary Latest Range: 70-99 mg/dL 409 (H) 811 (H) 66 (L) 63 (L) 81 107 (H)     Inpatient Diabetes Program Recommendations Correction (SSI): Please decrease SSI to Sensitive scale tid ac + HS.  Note: Will follow. Ambrose Finland RN, MSN, CDE Diabetes Coordinator Inpatient Diabetes Program [phone]

## 2011-05-09 NOTE — Progress Notes (Addendum)
DC orders received.  Patient stable with no S/S of distress.  Medication and discharge information reviewed with patient, wife and son.  Patient DC via private vehicle to SNF for rehab.  RN called report to John @ [facility]. Nolon Nations

## 2011-05-15 DIAGNOSIS — I10 Essential (primary) hypertension: Secondary | ICD-10-CM | POA: Diagnosis not present

## 2011-05-15 DIAGNOSIS — R55 Syncope and collapse: Secondary | ICD-10-CM | POA: Diagnosis not present

## 2011-05-15 DIAGNOSIS — I2510 Atherosclerotic heart disease of native coronary artery without angina pectoris: Secondary | ICD-10-CM | POA: Diagnosis not present

## 2011-06-10 DIAGNOSIS — I10 Essential (primary) hypertension: Secondary | ICD-10-CM | POA: Diagnosis not present

## 2011-07-03 DIAGNOSIS — I4891 Unspecified atrial fibrillation: Secondary | ICD-10-CM | POA: Diagnosis not present

## 2011-07-03 DIAGNOSIS — I10 Essential (primary) hypertension: Secondary | ICD-10-CM | POA: Diagnosis not present

## 2011-07-03 DIAGNOSIS — Z950 Presence of cardiac pacemaker: Secondary | ICD-10-CM | POA: Diagnosis not present

## 2011-07-03 DIAGNOSIS — E782 Mixed hyperlipidemia: Secondary | ICD-10-CM | POA: Diagnosis not present

## 2011-07-03 DIAGNOSIS — R269 Unspecified abnormalities of gait and mobility: Secondary | ICD-10-CM | POA: Diagnosis not present

## 2011-07-03 DIAGNOSIS — E119 Type 2 diabetes mellitus without complications: Secondary | ICD-10-CM | POA: Diagnosis not present

## 2011-07-03 DIAGNOSIS — Z9181 History of falling: Secondary | ICD-10-CM | POA: Diagnosis not present

## 2011-07-04 DIAGNOSIS — I4891 Unspecified atrial fibrillation: Secondary | ICD-10-CM | POA: Diagnosis not present

## 2011-07-04 DIAGNOSIS — E1139 Type 2 diabetes mellitus with other diabetic ophthalmic complication: Secondary | ICD-10-CM | POA: Diagnosis not present

## 2011-07-04 DIAGNOSIS — R55 Syncope and collapse: Secondary | ICD-10-CM | POA: Diagnosis not present

## 2011-07-04 DIAGNOSIS — I10 Essential (primary) hypertension: Secondary | ICD-10-CM | POA: Diagnosis not present

## 2011-07-04 DIAGNOSIS — E1149 Type 2 diabetes mellitus with other diabetic neurological complication: Secondary | ICD-10-CM | POA: Diagnosis not present

## 2011-07-04 DIAGNOSIS — R5381 Other malaise: Secondary | ICD-10-CM | POA: Diagnosis not present

## 2011-07-08 DIAGNOSIS — E119 Type 2 diabetes mellitus without complications: Secondary | ICD-10-CM | POA: Diagnosis not present

## 2011-07-08 DIAGNOSIS — R269 Unspecified abnormalities of gait and mobility: Secondary | ICD-10-CM | POA: Diagnosis not present

## 2011-07-08 DIAGNOSIS — I10 Essential (primary) hypertension: Secondary | ICD-10-CM | POA: Diagnosis not present

## 2011-07-08 DIAGNOSIS — E785 Hyperlipidemia, unspecified: Secondary | ICD-10-CM | POA: Diagnosis not present

## 2011-07-15 ENCOUNTER — Other Ambulatory Visit: Payer: Self-pay | Admitting: Neurology

## 2011-07-15 DIAGNOSIS — I10 Essential (primary) hypertension: Secondary | ICD-10-CM

## 2011-07-15 DIAGNOSIS — E785 Hyperlipidemia, unspecified: Secondary | ICD-10-CM

## 2011-07-15 DIAGNOSIS — E119 Type 2 diabetes mellitus without complications: Secondary | ICD-10-CM

## 2011-07-15 DIAGNOSIS — Z9181 History of falling: Secondary | ICD-10-CM

## 2011-07-15 DIAGNOSIS — R269 Unspecified abnormalities of gait and mobility: Secondary | ICD-10-CM

## 2011-07-16 DIAGNOSIS — R269 Unspecified abnormalities of gait and mobility: Secondary | ICD-10-CM | POA: Diagnosis not present

## 2011-07-17 ENCOUNTER — Ambulatory Visit
Admission: RE | Admit: 2011-07-17 | Discharge: 2011-07-17 | Disposition: A | Discharge status: Home / Self Care | Payer: Medicare Other | Source: Ambulatory Visit | Attending: Neurology | Admitting: Neurology

## 2011-07-17 DIAGNOSIS — Z9181 History of falling: Secondary | ICD-10-CM

## 2011-07-17 DIAGNOSIS — E119 Type 2 diabetes mellitus without complications: Secondary | ICD-10-CM

## 2011-07-17 DIAGNOSIS — M47817 Spondylosis without myelopathy or radiculopathy, lumbosacral region: Secondary | ICD-10-CM | POA: Diagnosis not present

## 2011-07-17 DIAGNOSIS — M5137 Other intervertebral disc degeneration, lumbosacral region: Secondary | ICD-10-CM | POA: Diagnosis not present

## 2011-07-17 DIAGNOSIS — M5126 Other intervertebral disc displacement, lumbar region: Secondary | ICD-10-CM | POA: Diagnosis not present

## 2011-07-17 DIAGNOSIS — E785 Hyperlipidemia, unspecified: Secondary | ICD-10-CM

## 2011-07-17 DIAGNOSIS — R269 Unspecified abnormalities of gait and mobility: Secondary | ICD-10-CM

## 2011-07-17 DIAGNOSIS — I10 Essential (primary) hypertension: Secondary | ICD-10-CM

## 2011-07-26 DIAGNOSIS — Z9181 History of falling: Secondary | ICD-10-CM | POA: Diagnosis not present

## 2011-07-26 DIAGNOSIS — E785 Hyperlipidemia, unspecified: Secondary | ICD-10-CM | POA: Diagnosis not present

## 2011-07-26 DIAGNOSIS — I10 Essential (primary) hypertension: Secondary | ICD-10-CM | POA: Diagnosis not present

## 2011-07-26 DIAGNOSIS — R269 Unspecified abnormalities of gait and mobility: Secondary | ICD-10-CM | POA: Diagnosis not present

## 2011-08-06 DIAGNOSIS — L570 Actinic keratosis: Secondary | ICD-10-CM | POA: Diagnosis not present

## 2011-08-06 DIAGNOSIS — L821 Other seborrheic keratosis: Secondary | ICD-10-CM | POA: Diagnosis not present

## 2011-10-11 DIAGNOSIS — Z950 Presence of cardiac pacemaker: Secondary | ICD-10-CM | POA: Diagnosis not present

## 2011-11-01 DIAGNOSIS — H04129 Dry eye syndrome of unspecified lacrimal gland: Secondary | ICD-10-CM | POA: Diagnosis not present

## 2011-11-01 DIAGNOSIS — H4011X0 Primary open-angle glaucoma, stage unspecified: Secondary | ICD-10-CM | POA: Diagnosis not present

## 2011-11-14 DIAGNOSIS — E1139 Type 2 diabetes mellitus with other diabetic ophthalmic complication: Secondary | ICD-10-CM | POA: Diagnosis not present

## 2011-11-14 DIAGNOSIS — I10 Essential (primary) hypertension: Secondary | ICD-10-CM | POA: Diagnosis not present

## 2011-11-14 DIAGNOSIS — Z23 Encounter for immunization: Secondary | ICD-10-CM | POA: Diagnosis not present

## 2011-11-14 DIAGNOSIS — J309 Allergic rhinitis, unspecified: Secondary | ICD-10-CM | POA: Diagnosis not present

## 2011-12-10 DIAGNOSIS — H409 Unspecified glaucoma: Secondary | ICD-10-CM | POA: Diagnosis not present

## 2011-12-10 DIAGNOSIS — H4011X0 Primary open-angle glaucoma, stage unspecified: Secondary | ICD-10-CM | POA: Diagnosis not present

## 2012-02-10 DIAGNOSIS — Z1331 Encounter for screening for depression: Secondary | ICD-10-CM | POA: Diagnosis not present

## 2012-02-10 DIAGNOSIS — F411 Generalized anxiety disorder: Secondary | ICD-10-CM | POA: Diagnosis not present

## 2012-02-10 DIAGNOSIS — E1139 Type 2 diabetes mellitus with other diabetic ophthalmic complication: Secondary | ICD-10-CM | POA: Diagnosis not present

## 2012-02-10 DIAGNOSIS — I10 Essential (primary) hypertension: Secondary | ICD-10-CM | POA: Diagnosis not present

## 2012-04-15 DIAGNOSIS — Z950 Presence of cardiac pacemaker: Secondary | ICD-10-CM | POA: Diagnosis not present

## 2012-04-15 DIAGNOSIS — E7800 Pure hypercholesterolemia, unspecified: Secondary | ICD-10-CM | POA: Diagnosis not present

## 2012-04-15 DIAGNOSIS — I10 Essential (primary) hypertension: Secondary | ICD-10-CM | POA: Diagnosis not present

## 2012-04-15 DIAGNOSIS — R0609 Other forms of dyspnea: Secondary | ICD-10-CM | POA: Diagnosis not present

## 2012-04-20 DIAGNOSIS — R0989 Other specified symptoms and signs involving the circulatory and respiratory systems: Secondary | ICD-10-CM | POA: Diagnosis not present

## 2012-04-20 DIAGNOSIS — R0609 Other forms of dyspnea: Secondary | ICD-10-CM | POA: Diagnosis not present

## 2012-04-20 DIAGNOSIS — I10 Essential (primary) hypertension: Secondary | ICD-10-CM | POA: Diagnosis not present

## 2012-04-20 DIAGNOSIS — Z950 Presence of cardiac pacemaker: Secondary | ICD-10-CM | POA: Diagnosis not present

## 2012-04-20 DIAGNOSIS — E7800 Pure hypercholesterolemia, unspecified: Secondary | ICD-10-CM | POA: Diagnosis not present

## 2012-04-29 DIAGNOSIS — J069 Acute upper respiratory infection, unspecified: Secondary | ICD-10-CM | POA: Diagnosis not present

## 2012-04-29 DIAGNOSIS — R197 Diarrhea, unspecified: Secondary | ICD-10-CM | POA: Diagnosis not present

## 2012-05-07 DIAGNOSIS — J069 Acute upper respiratory infection, unspecified: Secondary | ICD-10-CM | POA: Diagnosis not present

## 2012-05-07 DIAGNOSIS — R197 Diarrhea, unspecified: Secondary | ICD-10-CM | POA: Diagnosis not present

## 2012-05-07 DIAGNOSIS — E1139 Type 2 diabetes mellitus with other diabetic ophthalmic complication: Secondary | ICD-10-CM | POA: Diagnosis not present

## 2012-05-07 DIAGNOSIS — R112 Nausea with vomiting, unspecified: Secondary | ICD-10-CM | POA: Diagnosis not present

## 2012-05-11 ENCOUNTER — Encounter: Payer: Self-pay | Admitting: Neurosurgery

## 2012-05-12 ENCOUNTER — Inpatient Hospital Stay (HOSPITAL_COMMUNITY)
Admission: EM | Admit: 2012-05-12 | Discharge: 2012-05-15 | DRG: 372 | Disposition: A | Discharge status: Rehabilitation Facility | Payer: Medicare Other | Attending: Internal Medicine | Admitting: Internal Medicine

## 2012-05-12 ENCOUNTER — Encounter (HOSPITAL_COMMUNITY): Payer: Self-pay | Admitting: *Deleted

## 2012-05-12 ENCOUNTER — Emergency Department (HOSPITAL_COMMUNITY): Payer: Medicare Other

## 2012-05-12 ENCOUNTER — Other Ambulatory Visit: Payer: Medicare Other

## 2012-05-12 ENCOUNTER — Ambulatory Visit: Payer: Medicare Other | Admitting: Neurosurgery

## 2012-05-12 DIAGNOSIS — N179 Acute kidney failure, unspecified: Secondary | ICD-10-CM | POA: Diagnosis not present

## 2012-05-12 DIAGNOSIS — Z950 Presence of cardiac pacemaker: Secondary | ICD-10-CM | POA: Diagnosis not present

## 2012-05-12 DIAGNOSIS — Z7982 Long term (current) use of aspirin: Secondary | ICD-10-CM

## 2012-05-12 DIAGNOSIS — R197 Diarrhea, unspecified: Secondary | ICD-10-CM

## 2012-05-12 DIAGNOSIS — E119 Type 2 diabetes mellitus without complications: Secondary | ICD-10-CM | POA: Diagnosis present

## 2012-05-12 DIAGNOSIS — F3289 Other specified depressive episodes: Secondary | ICD-10-CM | POA: Diagnosis present

## 2012-05-12 DIAGNOSIS — Z79899 Other long term (current) drug therapy: Secondary | ICD-10-CM

## 2012-05-12 DIAGNOSIS — R55 Syncope and collapse: Secondary | ICD-10-CM

## 2012-05-12 DIAGNOSIS — F329 Major depressive disorder, single episode, unspecified: Secondary | ICD-10-CM | POA: Diagnosis present

## 2012-05-12 DIAGNOSIS — I2510 Atherosclerotic heart disease of native coronary artery without angina pectoris: Secondary | ICD-10-CM

## 2012-05-12 DIAGNOSIS — R112 Nausea with vomiting, unspecified: Secondary | ICD-10-CM | POA: Diagnosis not present

## 2012-05-12 DIAGNOSIS — R5381 Other malaise: Secondary | ICD-10-CM | POA: Diagnosis present

## 2012-05-12 DIAGNOSIS — E440 Moderate protein-calorie malnutrition: Secondary | ICD-10-CM | POA: Diagnosis present

## 2012-05-12 DIAGNOSIS — Z5189 Encounter for other specified aftercare: Secondary | ICD-10-CM | POA: Diagnosis not present

## 2012-05-12 DIAGNOSIS — R404 Transient alteration of awareness: Secondary | ICD-10-CM | POA: Diagnosis not present

## 2012-05-12 DIAGNOSIS — I959 Hypotension, unspecified: Secondary | ICD-10-CM | POA: Diagnosis not present

## 2012-05-12 DIAGNOSIS — E860 Dehydration: Secondary | ICD-10-CM

## 2012-05-12 DIAGNOSIS — I10 Essential (primary) hypertension: Secondary | ICD-10-CM | POA: Diagnosis present

## 2012-05-12 DIAGNOSIS — A0472 Enterocolitis due to Clostridium difficile, not specified as recurrent: Secondary | ICD-10-CM | POA: Diagnosis not present

## 2012-05-12 DIAGNOSIS — E876 Hypokalemia: Secondary | ICD-10-CM | POA: Diagnosis not present

## 2012-05-12 HISTORY — DX: Acute kidney failure, unspecified: N17.9

## 2012-05-12 LAB — GLUCOSE, CAPILLARY
Glucose-Capillary: 88 mg/dL (ref 70–99)
Glucose-Capillary: 121 mg/dL — ABNORMAL HIGH (ref 70–99)

## 2012-05-12 LAB — COMPREHENSIVE METABOLIC PANEL
AST: 21 U/L (ref 0–37)
Albumin: 3.2 g/dL — ABNORMAL LOW (ref 3.5–5.2)
Chloride: 101 mEq/L (ref 96–112)
Creatinine, Ser: 2.1 mg/dL — ABNORMAL HIGH (ref 0.50–1.35)
Total Bilirubin: 0.6 mg/dL (ref 0.3–1.2)

## 2012-05-12 LAB — CBC WITH DIFFERENTIAL/PLATELET
Eosinophils Relative: 0 % (ref 0–5)
MCH: 32.4 pg (ref 26.0–34.0)
Monocytes Absolute: 2.4 10*3/uL — ABNORMAL HIGH (ref 0.1–1.0)
Monocytes Relative: 16 % — ABNORMAL HIGH (ref 3–12)
Neutrophils Relative %: 67 % (ref 43–77)
Platelets: 228 10*3/uL (ref 150–400)
RBC: 4.23 MIL/uL (ref 4.22–5.81)
WBC: 15 10*3/uL — ABNORMAL HIGH (ref 4.0–10.5)

## 2012-05-12 LAB — URINE MICROSCOPIC-ADD ON

## 2012-05-12 LAB — URINALYSIS, ROUTINE W REFLEX MICROSCOPIC
Glucose, UA: NEGATIVE mg/dL
Ketones, ur: 15 mg/dL — AB
pH: 5.5 (ref 5.0–8.0)

## 2012-05-12 LAB — CLOSTRIDIUM DIFFICILE BY PCR: Toxigenic C. Difficile by PCR: POSITIVE — AB

## 2012-05-12 LAB — MAGNESIUM: Magnesium: 2 mg/dL (ref 1.5–2.5)

## 2012-05-12 MED ORDER — DIAZEPAM 5 MG PO TABS: 10.0000 mg | ORAL_TABLET | Freq: Three times a day (TID) | ORAL | Status: DC | PRN

## 2012-05-12 MED ORDER — ADULT MULTIVITAMIN W/MINERALS CH
1.0000 | ORAL_TABLET | Freq: Every day | ORAL | Status: DC
  Administered 2012-05-12 – 2012-05-15 (×4): 1 via ORAL
  Filled 2012-05-12 (×4): qty 1

## 2012-05-12 MED ORDER — ASPIRIN EC 81 MG PO TBEC
81.0000 mg | DELAYED_RELEASE_TABLET | Freq: Every day | ORAL | Status: DC
  Administered 2012-05-13 – 2012-05-15 (×3): 81 mg via ORAL
  Filled 2012-05-12 (×4): qty 1

## 2012-05-12 MED ORDER — HYDROCODONE-ACETAMINOPHEN 5-325 MG PO TABS
1.0000 | ORAL_TABLET | ORAL | Status: DC | PRN
  Administered 2012-05-12: 2 via ORAL
  Administered 2012-05-14: 1 via ORAL
  Administered 2012-05-14: 2 via ORAL
  Filled 2012-05-12: qty 2
  Filled 2012-05-12: qty 1
  Filled 2012-05-12: qty 2

## 2012-05-12 MED ORDER — PANTOPRAZOLE SODIUM 40 MG PO TBEC
40.0000 mg | DELAYED_RELEASE_TABLET | Freq: Every day | ORAL | Status: DC
  Administered 2012-05-12 – 2012-05-13 (×2): 40 mg via ORAL
  Filled 2012-05-12 (×2): qty 1

## 2012-05-12 MED ORDER — SODIUM CHLORIDE 0.9 % IV SOLN
INTRAVENOUS | Status: DC
  Administered 2012-05-12: 100 mL/h via INTRAVENOUS
  Administered 2012-05-12: 20:00:00 via INTRAVENOUS

## 2012-05-12 MED ORDER — ENOXAPARIN SODIUM 30 MG/0.3ML ~~LOC~~ SOLN
30.0000 mg | SUBCUTANEOUS | Status: DC
  Administered 2012-05-12 – 2012-05-13 (×2): 30 mg via SUBCUTANEOUS
  Filled 2012-05-12 (×3): qty 0.3

## 2012-05-12 MED ORDER — LISINOPRIL 5 MG PO TABS
5.0000 mg | ORAL_TABLET | Freq: Every day | ORAL | Status: DC
  Administered 2012-05-12: 5 mg via ORAL
  Filled 2012-05-12: qty 1

## 2012-05-12 MED ORDER — SPIRONOLACTONE 25 MG PO TABS
25.0000 mg | ORAL_TABLET | Freq: Two times a day (BID) | ORAL | Status: DC
  Administered 2012-05-12: 25 mg via ORAL
  Filled 2012-05-12 (×3): qty 1

## 2012-05-12 MED ORDER — VENLAFAXINE HCL ER 75 MG PO CP24
75.0000 mg | ORAL_CAPSULE | Freq: Every day | ORAL | Status: DC
  Administered 2012-05-12 – 2012-05-15 (×4): 75 mg via ORAL
  Filled 2012-05-12 (×4): qty 1

## 2012-05-12 MED ORDER — ESCITALOPRAM OXALATE 10 MG PO TABS
10.0000 mg | ORAL_TABLET | Freq: Every day | ORAL | Status: DC
  Administered 2012-05-12 – 2012-05-15 (×4): 10 mg via ORAL
  Filled 2012-05-12 (×4): qty 1

## 2012-05-12 MED ORDER — POTASSIUM CHLORIDE 10 MEQ/100ML IV SOLN
10.0000 meq | INTRAVENOUS | Status: AC
  Administered 2012-05-12 (×4): 10 meq via INTRAVENOUS
  Filled 2012-05-12 (×4): qty 100

## 2012-05-12 MED ORDER — SODIUM CHLORIDE 0.9 % IJ SOLN
3.0000 mL | Freq: Two times a day (BID) | INTRAMUSCULAR | Status: DC
  Administered 2012-05-12 – 2012-05-15 (×4): 3 mL via INTRAVENOUS

## 2012-05-12 MED ORDER — TERAZOSIN HCL 5 MG PO CAPS
5.0000 mg | ORAL_CAPSULE | Freq: Every day | ORAL | Status: DC
  Administered 2012-05-12 – 2012-05-14 (×3): 5 mg via ORAL
  Filled 2012-05-12 (×4): qty 1

## 2012-05-12 MED ORDER — FLUTICASONE PROPIONATE 50 MCG/ACT NA SUSP
2.0000 | Freq: Every day | NASAL | Status: DC
  Administered 2012-05-12 – 2012-05-15 (×4): 2 via NASAL
  Filled 2012-05-12: qty 16

## 2012-05-12 MED ORDER — SODIUM CHLORIDE 0.9 % IV BOLUS (SEPSIS)
500.0000 mL | Freq: Once | INTRAVENOUS | Status: AC
  Administered 2012-05-12: 500 mL via INTRAVENOUS

## 2012-05-12 MED ORDER — ONDANSETRON HCL 4 MG/2ML IJ SOLN: 4.0000 mg | Freq: Four times a day (QID) | INTRAMUSCULAR | Status: DC | PRN

## 2012-05-12 MED ORDER — METRONIDAZOLE IN NACL 5-0.79 MG/ML-% IV SOLN
500.0000 mg | Freq: Three times a day (TID) | INTRAVENOUS | Status: DC
  Administered 2012-05-12 – 2012-05-13 (×3): 500 mg via INTRAVENOUS
  Filled 2012-05-12 (×6): qty 100

## 2012-05-12 MED ORDER — ONDANSETRON HCL 4 MG PO TABS: 4.0000 mg | ORAL_TABLET | Freq: Four times a day (QID) | ORAL | Status: DC | PRN

## 2012-05-12 MED ORDER — SIMVASTATIN 40 MG PO TABS
40.0000 mg | ORAL_TABLET | Freq: Every day | ORAL | Status: DC
  Administered 2012-05-12 – 2012-05-14 (×3): 40 mg via ORAL
  Filled 2012-05-12 (×4): qty 1

## 2012-05-12 MED ORDER — POTASSIUM CHLORIDE 10 MEQ/100ML IV SOLN
10.0000 meq | INTRAVENOUS | Status: DC
  Administered 2012-05-12 (×3): 10 meq via INTRAVENOUS
  Filled 2012-05-12 (×3): qty 100

## 2012-05-12 NOTE — ED Provider Notes (Signed)
History     CSN: [id number]  Arrival date & time 05/12/12  1017   First MD Initiated Contact with Patient 05/12/12 1021      Chief Complaint  Patient presents with  . Emesis  . Weakness  . Diarrhea     HPI 79 year old male who presents to the emergency department with diarrhea. Reports this is been going on for the last several months and slowly getting worse. Worse over the last 5 days. Now going approximately 15 times a day. Very watery. Nonbloody. Was seen by GI recent and stool studies were sent. Patient unaware of results. No fevers or chills. Patient not to the point where he can no longer stand up unassisted. Patient normally walks with a cane until a week ago. He then transitioned to a walker. And then over last 24 hours now unable to get out of bed. EMS called due to the mobility and family concerned that they were no longer able to care for patient. No chest pain. No shortness of breath. No cough. No changes in urination. No unilateral weakness. No other symptoms.  Past Medical History  Diagnosis Date  . Hypertension   . Muscle spasm   . Diabetes mellitus   . Enlarged prostate   . CAD (coronary artery disease)   . Pacemaker     Past Surgical History  Procedure Laterality Date  . Pacemaker insertion      No family history on file.  History  Substance Use Topics  . Smoking status: Never Smoker   . Smokeless tobacco: Not on file  . Alcohol Use: No      Review of Systems  Constitutional: Positive for fatigue. Negative for fever and chills.  HENT: Negative for congestion, sore throat and neck pain.   Respiratory: Negative for cough.   Gastrointestinal: Positive for diarrhea. Negative for nausea, vomiting, abdominal pain and constipation.  Endocrine: Negative for polyuria.  Genitourinary: Negative for dysuria and hematuria.  Skin: Negative for rash.  Neurological: Negative for headaches.  Psychiatric/Behavioral: Negative.   All other systems reviewed and are  negative.    Allergies  Review of patient's allergies indicates no known allergies.  Home Medications   Current Outpatient Rx  Name  Route  Sig  Dispense  Refill  . aspirin EC 81 MG tablet   Oral   Take 81 mg by mouth daily.           . Cholecalciferol (VITAMIN D) 2000 UNITS tablet   Oral   Take 2,000 Units by mouth daily.         . diazepam (VALIUM) 10 MG tablet   Oral   Take 10 mg by mouth every 6 (six) hours as needed. anxiety          . esomeprazole (NEXIUM) 40 MG capsule   Oral   Take 40 mg by mouth daily before breakfast.           . Flax OIL   Oral   Take 1 tablet by mouth daily.         Marland Kitchen glipiZIDE (GLUCOTROL) 10 MG tablet   Oral   Take 0.5 tablets (5 mg total) by mouth daily.   1 tablet   1   . Multiple Vitamin (MULITIVITAMIN WITH MINERALS) TABS   Oral   Take 1 tablet by mouth daily.         Marland Kitchen OVER THE COUNTER MEDICATION   Oral   Take 4 tablets by mouth daily. Sal [place]met[place].         Marland Kitchen  OVER THE COUNTER MEDICATION   Oral   Take 1 tablet by mouth daily. Mega D-3         . polyethylene glycol (MIRALAX / GLYCOLAX) packet   Oral   Take 17 g by mouth daily as needed. As needed for constipation.         . simvastatin (ZOCOR) 40 MG tablet   Oral   Take 40 mg by mouth at bedtime.           Marland Kitchen venlafaxine (EFFEXOR-XR) 75 MG 24 hr capsule   Oral   Take 75 mg by mouth daily.             BP 98/42  Pulse 61  Temp(Src) 97.5 F (36.4 C) (Oral)  Resp 15  SpO2 96%  Physical Exam  Nursing note and vitals reviewed. Constitutional: He is oriented to person, place, and time. He appears well-developed and well-nourished. No distress.  HENT:  Head: Normocephalic and atraumatic.  Right Ear: External ear normal.  Left Ear: External ear normal.  Mouth/Throat: Oropharynx is clear and moist. No oropharyngeal exudate.  Eyes: Conjunctivae are normal. Pupils are equal, round, and reactive to light. Right eye exhibits no discharge.  Neck:  Normal range of motion. Neck supple. No tracheal deviation present.  Cardiovascular: Normal rate, regular rhythm and intact distal pulses.   Pulmonary/Chest: Effort normal. No respiratory distress. He has no wheezes. He has no rales.  Abdominal: Soft. He exhibits no distension. There is no tenderness. There is no rebound and no guarding.  Musculoskeletal: Normal range of motion.  Neurological: He is alert and oriented to person, place, and time. No cranial nerve deficit or sensory deficit. Coordination abnormal. Abnormal gait: unable to stand unassisted. GCS eye subscore is 4. GCS verbal subscore is 5. GCS motor subscore is 6.  Skin: Skin is warm and dry. No rash noted. He is not diaphoretic.  Psychiatric: He has a normal mood and affect.    ED Course  Procedures (including critical care time)  Labs Reviewed  CBC WITH DIFFERENTIAL - Abnormal; Notable for the following:    WBC 15.0 (*)    HCT 37.6 (*)    MCHC 36.4 (*)    Monocytes Relative 16 (*)    Neutro Abs 10.0 (*)    Monocytes Absolute 2.4 (*)    All other components within normal limits  COMPREHENSIVE METABOLIC PANEL - Abnormal; Notable for the following:    Potassium 2.8 (*)    Glucose, Bld 56 (*)    BUN 53 (*)    Creatinine, Ser 2.10 (*)    Albumin 3.2 (*)    GFR calc non Af Amer 27 (*)    GFR calc Af Amer 32 (*)    All other components within normal limits  URINALYSIS, ROUTINE W REFLEX MICROSCOPIC - Abnormal; Notable for the following:    Bilirubin Urine SMALL (*)    Ketones, ur 15 (*)    Leukocytes, UA SMALL (*)    All other components within normal limits  URINE MICROSCOPIC-ADD ON - Abnormal; Notable for the following:    Squamous Epithelial / LPF FEW (*)    Casts HYALINE CASTS (*)    All other components within normal limits  LACTIC ACID, PLASMA  MAGNESIUM   Dg Chest 2 View  05/12/2012  *RADIOLOGY REPORT*  Clinical Data: Vomiting, weakness, diarrhea, history hypertension, diabetes, coronary artery disease  CHEST  - 2 VIEW  Comparison: 05/06/2011  Findings: Right subclavian sequential transveous pacemaker leads project  within right atrium and right ventricle. Upper-normal size of cardiac silhouette. Calcified tortuous aorta. Mediastinal contours and pulmonary vascularity otherwise normal. Emphysematous changes with bibasilar scarring. No acute infiltrate, pleural effusion or pneumothorax. No acute osseous findings.  IMPRESSION: Question COPD with bibasilar scarring. No acute abnormalities.   Original Report Authenticated By: Ulyses Southward, M.D.      Date: 05/12/2012  Rate: 60  Rhythm: Ventricular paced  QRS Axis: indeterminate  Intervals: QT prolonged  ST/T Wave abnormalities: indeterminate  Conduction Disutrbances:none  Narrative Interpretation: Ventricular paced.  Old EKG Reviewed: unchanged     1. Hypokalemia   2. Acute kidney injury   3. Dehydration   4. Hypotension   5. Diarrhea       MDM   79 year old male presents to the emergency department with generalized fatigue and diarrhea. Progressive over the last few months. Patient no longer able to bear weight and accompanied only by elderly wife at home. General workup performed. Workup positive for hypokalemia and acute renal. Clinically patient looking dehydrated mildly decreased blood pressure and orthostasis to support this. Normal saline bolus administered. IV potassium initiated. Internal medicine hospitalist service consult for admission. Patient's primary care physician updated on patient's status. After administration of the fluid patient's blood pressure responded appropriately. Labs with mild leukocytosis. This is likely secondary to an infectious gastroenteritis. Will obtain stool studies at this time. Otherwise, the patient is safe for admission to the floor. Patient admitted to the floor in stable condition. No other acute concerns on the emergency department.      Arloa Koh, MD 05/12/12 1530

## 2012-05-12 NOTE — Progress Notes (Signed)
Pt had a run of 4 PVC's while trying to void.  MD text/paged.

## 2012-05-12 NOTE — ED Notes (Signed)
Patient transported to X-ray 

## 2012-05-12 NOTE — Progress Notes (Signed)
Critical Lab result of pts. Stool specimen positive for C-Diff. On call, Merdis Delay, NP made aware. No new orders received. Enteric precautions in place. Will continue to monitor pt. Wall, Cheryll Dessert

## 2012-05-12 NOTE — ED Provider Notes (Signed)
I saw and evaluated the patient, reviewed the resident's note and I agree with the findings and plan.  Tobin Chad, MD 05/12/12 [phone]

## 2012-05-12 NOTE — ED Notes (Signed)
Report given to Courtney, RN.

## 2012-05-12 NOTE — H&P (Addendum)
[facility] History and Physical  Randall Odom ZOX:[id number] DOB: 24-Dec-1926 DOA: 05/12/2012  Referring physician: ED physician PCP: Hoyle Sauer, MD   Chief Complaint: generalized weakness   HPI:  Pt is 79 year old male who presents to the emergency department with main concern of progressive non watery diarrhea that initially started 5 days prior to admission and has been associated with poor oral intake and generalized, dull abdominal pain, generalized weakness. He reports over 10 episodes per day. He explains that he was seen recently by GI specialist and they have ordered stool studies but he is not sure which ones. Pt denies fevers, chill, other viral symptoms, no other abdominal concerns, no urinary concerns, no specifc focal neurological weakness.  In ED, pt found to have mild leukocytosis, hypokalemia, acute renal failure with Cr 2.21. [facility] asked to admit for further evaluation.  Assessment and Plan:  Principal Problem:   Diarrhea - unclear etiology and possibly viral  - C. Diff and other stool studies pending at this time - will provide supportive care with IVF, analgesia as needed - hold off on antihypertensive medications - supplement electrolytes  - start Flagyl empirically and discontinue if C Diff by PCR negative  Active Problems:   Acute renal failure - likely of pre renal etiology in teh setting of diarrhea - will give bolus NS, continue at 100 cc/hr - hold lisinopril, BMP in am    Hypokalemia - secondary to diarrhea  - will continue to supplement via IV - magnesium is within normal limits - BMP in AM   Diabetes mellitus - hold Glipizide and SSI for now as pt is hypoglycemic   HTN (hypertension) - pt has soft BP at this time so will hold home antihypertensive medications   Code Status: Full Family Communication: Pt at bedside, no other family present at bedside  Disposition Plan: PT evaluation prior to discharge, admit to telemetry unit    Review of Systems:  Constitutional: Negative for fever, chills and malaise/fatigue. Negative for diaphoresis.  HENT: Negative for hearing loss, ear pain, nosebleeds, congestion, sore throat, neck pain, tinnitus and ear discharge.   Eyes: Negative for blurred vision, double vision, photophobia, pain, discharge and redness.  Respiratory: Negative for cough, hemoptysis, sputum production, shortness of breath, wheezing and stridor.   Cardiovascular: Negative for chest pain, palpitations, orthopnea, claudication and leg swelling.  Gastrointestinal: Positive for nausea, diarrhea and abdominal pain. Negative for heartburn, constipation, blood in stool and melena.  Genitourinary: Negative for dysuria, urgency, frequency, hematuria and flank pain.  Musculoskeletal: Negative for myalgias, back pain, joint pain and falls.  Skin: Negative for itching and rash.  Neurological: Negative for dizziness, tingling, tremors, sensory change, speech change, focal weakness, loss of consciousness and headaches.  Endo/Heme/Allergies: Negative for environmental allergies and polydipsia. Does not bruise/bleed easily.  Psychiatric/Behavioral: Negative for suicidal ideas. The patient is not nervous/anxious.      Past Medical History  Diagnosis Date  . Hypertension   . Muscle spasm   . Diabetes mellitus   . Enlarged prostate   . CAD (coronary artery disease)   . Pacemaker     Past Surgical History  Procedure Laterality Date  . Pacemaker insertion      Social History:  reports that he has never smoked. He does not have any smokeless tobacco history on file. He reports that he does not drink alcohol or use illicit drugs.  No Known Allergies  No family history of cancers or heart disease   Prior to Admission  medications   Medication Sig Start Date End Date Taking? Authorizing Provider  ciprofloxacin (CIPRO) 500 MG tablet Take 500 mg by mouth 2 (two) times daily. Starting 2/19 for 5 days   Yes Historical  Provider, MD  diphenoxylate-atropine (LOMOTIL) 2.5-0.025 MG per tablet Take 1 tablet by mouth 4 (four) times daily as needed for diarrhea or loose stools.   Yes Historical Provider, MD  escitalopram (LEXAPRO) 10 MG tablet Take 10 mg by mouth daily.   Yes Historical Provider, MD  esomeprazole (NEXIUM) 40 MG capsule Take 40 mg by mouth daily before breakfast.     Yes Historical Provider, MD  fluticasone (FLONASE) 50 MCG/ACT nasal spray Place 2 sprays into the nose daily.   Yes Historical Provider, MD  glipiZIDE (GLUCOTROL) 10 MG tablet Take 0.5 tablets (5 mg total) by mouth daily. 05/09/11  Yes Leroy Sea, MD  lisinopril (PRINIVIL,ZESTRIL) 5 MG tablet Take 5 mg by mouth daily.   Yes Historical Provider, MD  simvastatin (ZOCOR) 40 MG tablet Take 40 mg by mouth at bedtime.     Yes Historical Provider, MD  spironolactone (ALDACTONE) 25 MG tablet Take 25 mg by mouth 2 (two) times daily.   Yes Historical Provider, MD  terazosin (HYTRIN) 5 MG capsule Take 5 mg by mouth at bedtime.   Yes Historical Provider, MD  aspirin EC 81 MG tablet Take 81 mg by mouth daily.      Historical Provider, MD  Cholecalciferol (VITAMIN D) 2000 UNITS tablet Take 2,000 Units by mouth daily.    Historical Provider, MD  diazepam (VALIUM) 10 MG tablet Take 10 mg by mouth every 6 (six) hours as needed. anxiety     Historical Provider, MD  Flax OIL Take 1 tablet by mouth daily.    Historical Provider, MD  Multiple Vitamin (MULITIVITAMIN WITH MINERALS) TABS Take 1 tablet by mouth daily.    Historical Provider, MD  OVER THE COUNTER MEDICATION Take 4 tablets by mouth daily. Sal Palmetto.    Historical Provider, MD  OVER THE COUNTER MEDICATION Take 1 tablet by mouth daily. Mega D-3    Historical Provider, MD  polyethylene glycol (MIRALAX / GLYCOLAX) packet Take 17 g by mouth daily as needed. As needed for constipation.    Historical Provider, MD  venlafaxine (EFFEXOR-XR) 75 MG 24 hr capsule Take 75 mg by mouth daily.      Historical  Provider, MD    Physical Exam: Filed Vitals:   05/12/12 1240 05/12/12 1300 05/12/12 1335 05/12/12 1400  BP: 80/40 101/49 101/49 113/46  Pulse: 80 67 67 68  Temp:      TempSrc:      Resp:  13 31 16   SpO2:  99% 98% 97%    Physical Exam  Constitutional: Appears well-developed and well-nourished. No distress.  HENT: Normocephalic. External right and left ear normal. Oropharynx is clear and moist.  Eyes: Conjunctivae and EOM are normal. PERRLA, no scleral icterus.  Neck: Normal ROM. Neck supple. No JVD. No tracheal deviation. No thyromegaly.  CVS: RRR, S1/S2 +, no murmurs, no gallops, no carotid bruit.  Pulmonary: Effort and breath sounds normal, no stridor, rhonchi, wheezes, rales.  Abdominal: Soft. BS +,  no distension, tenderness in epigastric area, no rebound or guarding.  Musculoskeletal: Normal range of motion. No edema and no tenderness.  Lymphadenopathy: No lymphadenopathy noted, cervical, inguinal. Neuro: Alert. Normal reflexes, muscle tone coordination. No cranial nerve deficit. Skin: Skin is warm and dry. No rash noted. Not diaphoretic. No erythema. No  pallor.  Psychiatric: Normal mood and affect. Behavior, judgment, thought content normal.   Labs on Admission:  Basic Metabolic Panel:  Recent Labs Lab 05/12/12 1158  NA 140  K 2.8*  CL 101  CO2 24  GLUCOSE 56*  BUN 53*  CREATININE 2.10*  CALCIUM 9.8  MG 2.0   Liver Function Tests:  Recent Labs Lab 05/12/12 1158  AST 21  ALT 17  ALKPHOS 86  BILITOT 0.6  PROT 6.6  ALBUMIN 3.2*   CBC:  Recent Labs Lab 05/12/12 1158  WBC 15.0*  NEUTROABS 10.0*  HGB 13.7  HCT 37.6*  MCV 88.9  PLT 228   Radiological Exams on Admission: Dg Chest 2 View 05/12/2012   Question COPD with bibasilar scarring. No acute abnormalities.     EKG: Normal sinus rhythm, no ST/T wave changes  Debbora Presto, MD  [facility] Pager [phone]  If 7PM-7AM, please contact night-coverage [email or web address] Password  [facility] 05/12/2012, 2:48 PM

## 2012-05-12 NOTE — ED Notes (Signed)
Per EMS pt from home with c/o generalized weakness along with nausea, vomiting, diarrhea. Denies fever. Denies pain. GI symptoms started approximately 3 weeks ago, has seen PCP, also seen GI doctor. Symptoms have become more severe over last 3-4 days. Pt now having to use walker versus cane. CBG 63. IV 20G L hand.

## 2012-05-13 DIAGNOSIS — A0472 Enterocolitis due to Clostridium difficile, not specified as recurrent: Secondary | ICD-10-CM

## 2012-05-13 DIAGNOSIS — E860 Dehydration: Secondary | ICD-10-CM

## 2012-05-13 DIAGNOSIS — E876 Hypokalemia: Secondary | ICD-10-CM | POA: Diagnosis not present

## 2012-05-13 HISTORY — DX: Enterocolitis due to Clostridium difficile, not specified as recurrent: A04.72

## 2012-05-13 LAB — CBC
MCHC: 35 g/dL (ref 30.0–36.0)
Platelets: 216 10*3/uL (ref 150–400)
RDW: 13.7 % (ref 11.5–15.5)

## 2012-05-13 LAB — GLUCOSE, CAPILLARY: Glucose-Capillary: 112 mg/dL — ABNORMAL HIGH (ref 70–99)

## 2012-05-13 LAB — BASIC METABOLIC PANEL
BUN: 41 mg/dL — ABNORMAL HIGH (ref 6–23)
BUN: 32 mg/dL — ABNORMAL HIGH (ref 6–23)
CO2: 23 mEq/L (ref 19–32)
Calcium: 9.3 mg/dL (ref 8.4–10.5)
Creatinine, Ser: 1.36 mg/dL — ABNORMAL HIGH (ref 0.50–1.35)
Creatinine, Ser: 1.14 mg/dL (ref 0.50–1.35)
GFR calc Af Amer: 54 mL/min — ABNORMAL LOW (ref >90)
GFR calc non Af Amer: 46 mL/min — ABNORMAL LOW (ref >90)
Glucose, Bld: 125 mg/dL — ABNORMAL HIGH (ref 70–99)

## 2012-05-13 LAB — OVA AND PARASITE EXAMINATION

## 2012-05-13 MED ORDER — VANCOMYCIN 50 MG/ML ORAL SOLUTION
125.0000 mg | Freq: Four times a day (QID) | ORAL | Status: DC
  Administered 2012-05-13 – 2012-05-15 (×8): 125 mg via ORAL
  Filled 2012-05-13 (×11): qty 2.5

## 2012-05-13 MED ORDER — POTASSIUM CHLORIDE CRYS ER 20 MEQ PO TBCR
40.0000 meq | EXTENDED_RELEASE_TABLET | ORAL | Status: AC
  Administered 2012-05-13 (×3): 40 meq via ORAL
  Filled 2012-05-13 (×3): qty 2

## 2012-05-13 MED ORDER — SACCHAROMYCES BOULARDII 250 MG PO CAPS
250.0000 mg | ORAL_CAPSULE | Freq: Two times a day (BID) | ORAL | Status: DC
  Administered 2012-05-13 – 2012-05-15 (×5): 250 mg via ORAL
  Filled 2012-05-13 (×6): qty 1

## 2012-05-13 MED ORDER — POTASSIUM CHLORIDE IN NACL 20-0.45 MEQ/L-% IV SOLN
INTRAVENOUS | Status: DC
  Administered 2012-05-13: 21:00:00 via INTRAVENOUS
  Administered 2012-05-13: 100 mL/h via INTRAVENOUS
  Administered 2012-05-14: 06:00:00 via INTRAVENOUS
  Filled 2012-05-13 (×4): qty 1000

## 2012-05-13 MED ORDER — POTASSIUM CHLORIDE CRYS ER 20 MEQ PO TBCR
40.0000 meq | EXTENDED_RELEASE_TABLET | Freq: Once | ORAL | Status: AC
  Administered 2012-05-13: 40 meq via ORAL
  Filled 2012-05-13: qty 2

## 2012-05-13 NOTE — Progress Notes (Signed)
Utilization Review Completed Cagney Steenson J. Laurana Magistro, RN, BSN, NCM [phone]  

## 2012-05-13 NOTE — Progress Notes (Signed)
Rehab Admissions Coordinator Note:  Patient was screened by Clois Dupes for appropriateness for an Inpatient Acute Rehab Consult.  At this time, we are recommending Inpatient Rehab consult. I will contact MD.   Clois Dupes 05/13/2012, 5:30 PM  I can be reached at [phone].

## 2012-05-13 NOTE — Evaluation (Signed)
Physical Therapy Evaluation Patient Details Name: Randall Odom MRN: [id number] DOB: 06-01-1926 Today's Date: 05/13/2012 Time: 4098-1191 PT Time Calculation (min): 42 min  PT Assessment / Plan / Recommendation Clinical Impression  79 y.o. male admitted to [facility] with + c-diff, diarrhea and weakness.  He presents today with generalized weakness legs>arms, decreased mobility, decreased activity tolerance, increased low back pain with mobility and decreased ability to stand and walk without maximal assistance.  He would be an excellent candidate for inpatient rehab as he has excellent support from a heathy wife and family at home and he is motivated to getting back to an active lifestyle.      PT Assessment  Patient needs continued PT services    Follow Up Recommendations  CIR    Does the patient have the potential to tolerate intense rehabilitation     yes  Barriers to Discharge None None    Equipment Recommendations  None recommended by PT    Recommendations for Other Services Rehab consult   Frequency Min 3X/week    Precautions / Restrictions Precautions Precautions: Fall;Other (comment) (c-diff contact) Precaution Comments: c- diff contact   Pertinent Vitals/Pain Reports chronic low back pain with activity     Mobility  Bed Mobility Rolling Right: 6: Modified independent (Device/Increase time);With rail Right Sidelying to Sit: 3: Mod assist;With rails;HOB elevated Sitting - Scoot to Edge of Bed: 3: Mod assist;With rail Sit to Supine: 5: Supervision;With rail;HOB flat Scooting to HOB: 5: Supervision;With rail;Other (comment) (bed in maximal trendelenberg) Details for Bed Mobility Assistance: heavy reliance on hands on rails to get to sitting.  Pulling on bed rail and therapist with free hand.   Transfers Transfers: Sit to Stand;Stand to Sit Sit to Stand: 2: Max assist;With upper extremity assist;With armrests;From bed;Without upper extremity assist Stand to Sit: 2: Max  assist;Not tested (comment);With armrests;To bed;To elevated surface Details for Transfer Assistance: max assist to support trunk over weak legs and to anteriorly wieght shift pt over his feet once standing.  Posterior lean with bil lower legs supported by bed for balance and stability.   Ambulation/Gait Ambulation/Gait Assistance: Not tested (comment) (pt not ready )        PT Diagnosis: Abnormality of gait;Difficulty walking;Generalized weakness  PT Problem List: Decreased strength;Decreased activity tolerance;Decreased balance;Decreased mobility;Obesity;Pain PT Treatment Interventions: DME instruction;Gait training;Stair training;Functional mobility training;Therapeutic activities;Therapeutic exercise;Balance training;Neuromuscular re-education;Patient/family education   PT Goals Acute Rehab PT Goals PT Goal Formulation: With patient/family Time For Goal Achievement: 05/27/12 Potential to Achieve Goals: Good Pt will go Supine/Side to Sit: with modified independence;with HOB 0 degrees PT Goal: Supine/Side to Sit - Progress: Goal set today Pt will go Sit to Supine/Side: with modified independence;with HOB 0 degrees PT Goal: Sit to Supine/Side - Progress: Goal set today Pt will go Sit to Stand: with supervision;with upper extremity assist PT Goal: Sit to Stand - Progress: Goal set today Pt will go Stand to Sit: with supervision;with upper extremity assist PT Goal: Stand to Sit - Progress: Goal set today Pt will Transfer Bed to Chair/Chair to Bed: with supervision PT Transfer Goal: Bed to Chair/Chair to Bed - Progress: Goal set today Pt will Ambulate: 51 - 150 feet;with supervision;with rolling walker PT Goal: Ambulate - Progress: Goal set today Pt will Go Up / Down Stairs: 1-2 stairs;with supervision;with rolling walker PT Goal: Up/Down Stairs - Progress: Goal set today  Visit Information  Last PT Received On: 05/13/12 Assistance Needed: +1    Subjective Data  Subjective: Pt  reports for two days PTA he had to start using a RW and then he fell and his wife somehow managed to get him up off of the floor.   Patient Stated Goal: to get stronger so he can go back to working out at [employer] center.     Prior Functioning  Home Living Lives With: Spouse Available Help at Discharge: Family;Available 24 hours/day Type of Home: House Home Access: Stairs to enter [employer] of Steps: 1 Entrance Stairs-Rails: None Home Layout: One level Bathroom Shower/Tub: Walk-in shower;Door [employer] Toilet: Standard Home Adaptive Equipment: Grab bars in shower;Hand-held shower hose;Quad cane;Straight cane;Built-in shower seat;Walker - rolling Prior Function Level of Independence: Independent with assistive device(s);Needs assistance Needs Assistance: Meal Prep;Light Housekeeping Meal Prep: Total Light Housekeeping: Total Able to Take Stairs?: Yes Driving: Yes (short distances) 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      Extremity/Trunk Assessment Right Lower Extremity Assessment RLE ROM/Strength/Tone: Deficits RLE ROM/Strength/Tone Deficits: ankle 3/5, knee 3-/5, hip 3-/5 RLE Sensation: History of peripheral neuropathy Left Lower Extremity Assessment LLE ROM/Strength/Tone: Deficits LLE ROM/Strength/Tone Deficits: ankle 3/5, knee 3-/5, hip 3-/5 LLE Sensation: History of peripheral neuropathy   Balance Static Sitting Balance Static Sitting - Balance Support: Bilateral upper extremity supported;Feet supported Static Sitting - Level of Assistance: 5: Stand by assistance Static Sitting - Comment/# of Minutes:  sat EOB x 10 mins for MMT, activity tolerance Static Standing Balance Static Standing - Balance Support: Bilateral upper extremity supported Static Standing - Level of Assistance: 3: Mod assist Static Standing - Comment/# of Minutes: mod assist to support trunk in standing, anteriorly wieght shfit pt over feet.  Holding therapist's hand on the left and bed rail on the right vs bedside table.  Did work on weight shifting and pre gait activities while standing x 2 < 5 mins.  Pt has decreased standing tolerance due to history of chronic low back pain.    End of Session PT - End of Session Activity Tolerance: Patient limited by fatigue;Patient limited by pain Patient left: in bed;with call bell/phone within reach;with family/visitor present (wife and sister in law)    Lurena Joiner B. Medendorp, PT, DPT [phone]   05/13/2012, 5:08 PM

## 2012-05-13 NOTE — Progress Notes (Signed)
INITIAL NUTRITION ASSESSMENT  DOCUMENTATION CODES Per approved criteria  -Non-severe (moderate) malnutrition in the context of acute illness or injury   INTERVENTION: 1. Encouraged po intake as able.   2. Agree with multivitamin and Florastor supplement  3. RD will continue to follow    NUTRITION DIAGNOSIS: Inadequate oral intake  related to nausea/diarrhea as evidenced by weight loss.   Goal: PO intake to meet >/=90% estimated nutrition needs  Monitor:  PO intake, I/O's, labs, weight trends   Reason for Assessment: Malnutrition Screening Tool  79 y.o. male  Admitting Dx: Diarrhea  ASSESSMENT: Pt admitted with C.Diff. Pt reports unable to eat well, less then one meal daily, and ongoing diarrhea x1 month. Pt reports 20 lb weight loss (9.4% body weight) in 1 month. Pt was able to take water, though some weight loss likely related to fluid status.  Reports was able to tolerate breakfast this morning, 100% meal completion documented.    Pt meets criteria for moderate malnutrition in the context of acute illness 2/2 to weight loss of 9.4% body weight in one month and meeting <75% estimated nutrition needs for > 1 week.   Height: Ht Readings from Last 1 Encounters:  05/12/12 5\' 10"  (1.778 m)    Weight: Wt Readings from Last 1 Encounters:  05/13/12 192 lb 6.4 oz (87.272 kg)    Ideal Body Weight: 166 lbs   % Ideal Body Weight: 115%  Wt Readings from Last 10 Encounters:  05/13/12 192 lb 6.4 oz (87.272 kg)  05/06/11 192 lb (87.091 kg)    Usual Body Weight: 212 lbs per pt report, in the last month.   % Usual Body Weight: 90%  BMI:  Body mass index is 27.61 kg/(m^2). Overweight   Estimated Nutritional Needs: Kcal: 1900-2100 Protein: 85-95 gm  Fluid: >/= 2.1 L/day  Skin: intact   Diet Order: General  EDUCATION NEEDS: -No education needs identified at this time   Intake/Output Summary (Last 24 hours) at 05/13/12 0929 Last data filed at 05/13/12 0841  Gross  per 24 hour  Intake    840 ml  Output    475 ml  Net    365 ml    Last BM: 3/5    Labs:   Recent Labs Lab 05/12/12 1158 05/13/12 0525  NA 140 140  K 2.8* 2.4*  CL 101 105  CO2 24 22  BUN 53* 41*  CREATININE 2.10* 1.36*  CALCIUM 9.8 9.5  MG 2.0  --   GLUCOSE 56* 98    CBG (last 3)   Recent Labs  05/12/12 1644 05/12/12 2223 05/13/12 0632  GLUCAP 88 121* 103*    Scheduled Meds: . aspirin EC  81 mg Oral Daily  . enoxaparin (LOVENOX) injection  30 mg Subcutaneous Q24H  . escitalopram  10 mg Oral Daily  . fluticasone  2 spray Each Nare Daily  . metronidazole  500 mg Intravenous Q8H  . multivitamin with minerals  1 tablet Oral Daily  . pantoprazole  40 mg Oral Daily  . potassium chloride  40 mEq Oral Q4H  . saccharomyces boulardii  250 mg Oral BID  . simvastatin  40 mg Oral QHS  . sodium chloride  3 mL Intravenous Q12H  . terazosin  5 mg Oral QHS  . venlafaxine XR  75 mg Oral Daily    Continuous Infusions: . 0.45 % NaCl with KCl 20 mEq / L 100 mL/hr (05/13/12 1610)    Past Medical History  Diagnosis Date  .  Hypertension   . Muscle spasm   . Diabetes mellitus   . Enlarged prostate   . CAD (coronary artery disease)   . Pacemaker     Past Surgical History  Procedure Laterality Date  . Pacemaker insertion      Clarene Duke RD, LDN Pager [phone] After Hours pager [phone]

## 2012-05-13 NOTE — Progress Notes (Signed)
[facility] PROGRESS NOTE  CATHERINE OAK ZOX:[id number] DOB: 03-05-1927 DOA: 05/12/2012 PCP: Hoyle Sauer, MD  Brief narrative Pt is 79 year old male who presents to the emergency department with main concern of progressive non watery diarrhea that initially started 5 days prior to admission and has been associated with poor oral intake and generalized, dull abdominal pain, generalized weakness. He reports over 10 episodes per day. He explains that he was seen recently by GI specialist and they have ordered stool studies but he is not sure which ones. Pt denies fevers, chill, other viral symptoms, no other abdominal concerns, no urinary concerns, no specifc focal neurological weakness.   In ED, pt found to have mild leukocytosis, hypokalemia, acute renal failure with Cr 2.21. [facility] asked to admit for further evaluation.   Assessment/Plan: 1. C. difficile colitis: Change Flagyl to by mouth vancomycin. Add probiotics. Monitor. Stool culture pending. Negative for O&P 2. Hypokalemia: Secondary to diarrhea. Aggressively replete and follow BMP later this p.m. 3. Dehydration: Continue IV fluids. 4. Acute renal failure: Likely secondary to diarrhea and dehydration. Improving. Continue IV fluids and trend daily BMP. Hold lisinopril 5. Type II DM: Reasonable inpatient control. Hold oral hypoglycemics. Continue SSI. 6. Hypertension: Soft blood pressures. Hold antihypertensives and monitor.   Code Status: Full Family Communication: Discussed with patient Disposition Plan: Home when medically stable.   Consultants:  None  Procedures:  None  Antibiotics:  IV Flagyl 05/12/12 > 05/13/12  By mouth vancomycin 05/13/12 >   HPI/Subjective: Generalized weakness. Indicates the diarrhea has not really changed. No significant abdominal pain. No nausea or vomiting.  Objective: Filed Vitals:   05/12/12 1900 05/12/12 2100 05/13/12 0609 05/13/12 1411  BP: 107/55 108/75 98/62 119/73  Pulse: 66 60  67 70  Temp: 97.6 F (36.4 C) 97.5 F (36.4 C) 98 F (36.7 C) 97.5 F (36.4 C)  TempSrc: Oral Oral Oral Oral  Resp: 18 18 18 18   Height:      Weight:   87.272 kg (192 lb 6.4 oz)   SpO2: 98% 96% 98% 98%    Intake/Output Summary (Last 24 hours) at 05/13/12 1528 Last data filed at 05/13/12 1438  Gross per 24 hour  Intake    840 ml  Output    751 ml  Net     89 ml   Filed Weights   05/12/12 1537 05/13/12 0609  Weight: 86.41 kg (190 lb 8 oz) 87.272 kg (192 lb 6.4 oz)    Exam:   General exam: Comfortable.  Respiratory system: Clear. No increased work of breathing.  Cardiovascular system: S1 & S2 heard, RRR. No JVD, murmurs, gallops, clicks or pedal edema.  Gastrointestinal system: Abdomen is nondistended, soft and nontender. Normal bowel sounds heard.  Central nervous system: Alert and oriented. No focal neurological deficits.  Extremities: Symmetric 5 x 5 power.   Data Reviewed: Basic Metabolic Panel:  Recent Labs Lab 05/12/12 1158 05/13/12 0525  NA 140 140  K 2.8* 2.4*  CL 101 105  CO2 24 22  GLUCOSE 56* 98  BUN 53* 41*  CREATININE 2.10* 1.36*  CALCIUM 9.8 9.5  MG 2.0  --    Liver Function Tests:  Recent Labs Lab 05/12/12 1158  AST 21  ALT 17  ALKPHOS 86  BILITOT 0.6  PROT 6.6  ALBUMIN 3.2*   No results found for this basename: LIPASE, AMYLASE,  in the last 168 hours No results found for this basename: AMMONIA,  in the last 168 hours CBC:  Recent Labs Lab 05/12/12 1158 05/13/12 0525  WBC 15.0* 12.1*  NEUTROABS 10.0*  --   HGB 13.7 12.3*  HCT 37.6* 35.1*  MCV 88.9 88.2  PLT 228 216   Cardiac Enzymes: No results found for this basename: CKTOTAL, CKMB, CKMBINDEX, TROPONINI,  in the last 168 hours BNP (last 3 results) No results found for this basename: PROBNP,  in the last 8760 hours CBG:  Recent Labs Lab 05/12/12 1644 05/12/12 2223 05/13/12 0632 05/13/12 1130  GLUCAP 88 121* 103* 135*    Recent Results (from the past 240  hour(s))  OVA AND PARASITE EXAMINATION     Status: None   Collection Time    05/12/12  4:59 PM      Result Value Range Status   Specimen Description STOOL   Final   Special Requests NONE   Final   Ova and parasites NO OVA OR PARASITES SEEN MODERATE YEAST   Final   Report Status 05/13/2012 FINAL   Final  CLOSTRIDIUM DIFFICILE BY PCR     Status: Abnormal   Collection Time    05/12/12  4:59 PM      Result Value Range Status   C difficile by pcr POSITIVE (*) NEGATIVE Final   Comment: CRITICAL RESULT CALLED TO, READ BACK BY AND VERIFIED WITH:     K WALL RN 2103 05/12/12 A BROWNING  STOOL CULTURE     Status: None   Collection Time    05/12/12  4:59 PM      Result Value Range Status   Specimen Description STOOL   Final   Special Requests NONE   Final   Culture Culture reincubated for better growth   Final   Report Status PENDING   Incomplete     Studies: Dg Chest 2 View  05/12/2012  *RADIOLOGY REPORT*  Clinical Data: Vomiting, weakness, diarrhea, history hypertension, diabetes, coronary artery disease  CHEST - 2 VIEW  Comparison: 05/06/2011  Findings: Right subclavian sequential transveous pacemaker leads project within right atrium and right ventricle. Upper-normal size of cardiac silhouette. Calcified tortuous aorta. Mediastinal contours and pulmonary vascularity otherwise normal. Emphysematous changes with bibasilar scarring. No acute infiltrate, pleural effusion or pneumothorax. No acute osseous findings.  IMPRESSION: Question COPD with bibasilar scarring. No acute abnormalities.   Original Report Authenticated By: Ulyses Southward, M.D.      Additional labs:   Scheduled Meds: . aspirin EC  81 mg Oral Daily  . enoxaparin (LOVENOX) injection  30 mg Subcutaneous Q24H  . escitalopram  10 mg Oral Daily  . fluticasone  2 spray Each Nare Daily  . metronidazole  500 mg Intravenous Q8H  . multivitamin with minerals  1 tablet Oral Daily  . pantoprazole  40 mg Oral Daily  . potassium chloride   40 mEq Oral Q4H  . saccharomyces boulardii  250 mg Oral BID  . simvastatin  40 mg Oral QHS  . sodium chloride  3 mL Intravenous Q12H  . terazosin  5 mg Oral QHS  . venlafaxine XR  75 mg Oral Daily   Continuous Infusions: . 0.45 % NaCl with KCl 20 mEq / L 100 mL/hr (05/13/12 0837)    Principal Problem:   Diarrhea Active Problems:   Diabetes mellitus   HTN (hypertension)   Acute renal failure   Hypokalemia    Time spent: 35 minutes    [facility]  [facility] Pager [phone].   If 8PM-8AM, please contact night-coverage at [email or web address], password [facility] 05/13/2012, 3:28 PM  LOS:  1 day

## 2012-05-13 NOTE — Progress Notes (Signed)
Lab called critical K 2.4.  Dr. Waymon Amato paged.

## 2012-05-14 DIAGNOSIS — R5381 Other malaise: Secondary | ICD-10-CM

## 2012-05-14 DIAGNOSIS — A0472 Enterocolitis due to Clostridium difficile, not specified as recurrent: Secondary | ICD-10-CM

## 2012-05-14 DIAGNOSIS — E876 Hypokalemia: Secondary | ICD-10-CM | POA: Diagnosis not present

## 2012-05-14 DIAGNOSIS — E860 Dehydration: Secondary | ICD-10-CM | POA: Diagnosis not present

## 2012-05-14 LAB — BASIC METABOLIC PANEL
CO2: 25 mEq/L (ref 19–32)
Chloride: 113 mEq/L — ABNORMAL HIGH (ref 96–112)
Potassium: 3.8 mEq/L (ref 3.5–5.1)
Sodium: 144 mEq/L (ref 135–145)

## 2012-05-14 LAB — GLUCOSE, CAPILLARY
Glucose-Capillary: 103 mg/dL — ABNORMAL HIGH (ref 70–99)
Glucose-Capillary: 104 mg/dL — ABNORMAL HIGH (ref 70–99)
Glucose-Capillary: 121 mg/dL — ABNORMAL HIGH (ref 70–99)

## 2012-05-14 LAB — CBC
HCT: 32.3 % — ABNORMAL LOW (ref 39.0–52.0)
Hemoglobin: 11.5 g/dL — ABNORMAL LOW (ref 13.0–17.0)
MCV: 89.5 fL (ref 78.0–100.0)
Platelets: 207 10*3/uL (ref 150–400)
RBC: 3.61 MIL/uL — ABNORMAL LOW (ref 4.22–5.81)
WBC: 10.2 10*3/uL (ref 4.0–10.5)

## 2012-05-14 MED ORDER — ENOXAPARIN SODIUM 40 MG/0.4ML ~~LOC~~ SOLN
40.0000 mg | SUBCUTANEOUS | Status: DC
  Administered 2012-05-14: 40 mg via SUBCUTANEOUS
  Filled 2012-05-14 (×2): qty 0.4

## 2012-05-14 NOTE — H&P (Signed)
Physical Medicine and Rehabilitation Admission H&P    Chief Complaint  Patient presents with  . Emesis  . Weakness  . Diarrhea  : HPI: Randall Odom is a 79 y.o. right-handed male with a history of hypertension, diabetes mellitus with peripheral neuropathy and coronary artery disease with pacemaker. Patient independent active until approximately 3 weeks ago. Patient presented 05/12/2012 through the emergency department with progressive non-watery diarrhea that initially (per chart) started 5 days prior to admission with associated decreased appetite and generalized weakness with dull abdominal pain. Patient reports to me a 3 week hx of decline. Patient stated recently seen a GI specialist and they had ordered stool studies but he was not sure which ones. Noted creatinine 2.21 and hypokalemic at 2.4. Clostridium difficile specimens 05/12/2012 positive and placed on Flagyl until 05/13/2012 and and change to by mouth vancomycin. Subcutaneous Lovenox added for DVT prophylaxis. Renal function continues to improve with gentle IV fluids latest creatinine 1.1 and 4 as well as potassium supplement for hypokalemia improved to 3.3. Physical therapy evaluation completed 05/13/2012 noted decrease in mobility as well as activity tolerance with recommendations of physical medicine rehabilitation consult to consider inpatient rehabilitation services. Patient was felt to be a candidate for inpatient rehabilitation services and was admitted for comprehensive rehabilitation program  Review of Systems  Gastrointestinal: Positive for abdominal pain and diarrhea.  Genitourinary: Positive for urgency.  Musculoskeletal: Positive for myalgias.  Neurological: Positive for weakness.  Psychiatric/Behavioral: Positive for depression.  All other systems reviewed and are negative   Past Medical History  Diagnosis Date  . Hypertension   . Muscle spasm   . Diabetes mellitus   . Enlarged prostate   . CAD (coronary artery  disease)   . Pacemaker    Past Surgical History  Procedure Laterality Date  . Pacemaker insertion     No family history on file. Social History:  reports that he has never smoked. He does not have any smokeless tobacco history on file. He reports that he does not drink alcohol or use illicit drugs. Allergies: No Known Allergies Medications Prior to Admission  Medication Sig Dispense Refill  . aspirin EC 81 MG tablet Take 81 mg by mouth daily.       . Cholecalciferol (VITAMIN D) 2000 UNITS tablet Take 2,000 Units by mouth daily.      . ciprofloxacin (CIPRO) 500 MG tablet Take 500 mg by mouth 2 (two) times daily. Starting 2/19 for 5 days      . diazepam (VALIUM) 10 MG tablet Take 10 mg by mouth every 6 (six) hours as needed. anxiety       . diphenoxylate-atropine (LOMOTIL) 2.5-0.025 MG per tablet Take 1 tablet by mouth 4 (four) times daily as needed for diarrhea or loose stools.      . escitalopram (LEXAPRO) 10 MG tablet Take 10 mg by mouth daily.      . esomeprazole (NEXIUM) 40 MG capsule Take 40 mg by mouth daily before breakfast.        . fluticasone (FLONASE) 50 MCG/ACT nasal spray Place 2 sprays into the nose daily as needed for allergies.      . glipiZIDE (GLUCOTROL) 5 MG tablet Take 5 mg by mouth 2 (two) times daily before a meal.      . lisinopril (PRINIVIL,ZESTRIL) 5 MG tablet Take 5 mg by mouth daily.      . Saw Palmetto, Serenoa repens, (SAW PALMETTO PO) Take 1 capsule by mouth 2 (two) times daily.      .   simvastatin (ZOCOR) 40 MG tablet Take 40 mg by mouth at bedtime.        . terazosin (HYTRIN) 5 MG capsule Take 5 mg by mouth at bedtime.      . spironolactone (ALDACTONE) 25 MG tablet Take 25 mg by mouth 2 (two) times daily.        Home: Home Living Lives With: Spouse Available Help at Discharge: Family;Available 24 hours/day Type of Home: House Home Access: Stairs to enter Entrance Stairs-Number of Steps: 1 Entrance Stairs-Rails: None Home Layout: One level Bathroom  Shower/Tub: Walk-in shower;Door Bathroom Toilet: Standard Home Adaptive Equipment: Grab bars in shower;Hand-held shower hose;Quad cane;Straight cane;Built-in shower seat;Walker - rolling   Functional History: Prior Function Meal Prep: Total Light Housekeeping: Total Able to Take Stairs?: Yes Driving: Yes (short distances) Vocation: Retired Comments: preacher  Functional Status:  Mobility: Bed Mobility Rolling Right: 6: Modified independent (Device/Increase time);With rail Right Sidelying to Sit: 3: Mod assist;With rails;HOB elevated Sitting - Scoot to Edge of Bed: 3: Mod assist;With rail Sit to Supine: 5: Supervision;With rail;HOB flat Scooting to HOB: 5: Supervision;With rail;Other (comment) (bed in maximal trendelenberg) Transfers Transfers: Sit to Stand;Stand to Sit Sit to Stand: 2: Max assist;With upper extremity assist;With armrests;From bed;Without upper extremity assist Stand to Sit: 2: Max assist;Not tested (comment);With armrests;To bed;To elevated surface Ambulation/Gait Ambulation/Gait Assistance: Not tested (comment) (pt not ready )    ADL:    Cognition: Cognition Orientation Level: Oriented X4 Cognition Overall Cognitive Status: Appears within functional limits for tasks assessed/performed Orientation Level: Oriented X4 / Intact Behavior During Session: WFL for tasks performed Cognition - Other Comments: not specifically tested, but seemed WNL for tasks assessed.    Physical Exam: Blood pressure 99/56, pulse 68, temperature 97.7 F (36.5 C), temperature source Oral, resp. rate 19, height 5' 10" (1.778 m), weight 92.08 kg (203 lb), SpO2 97.00%. Physical Exam  Vitals reviewed.  Constitutional: He is oriented to person, place, and time.  Appears fatigued  HENT:  Head: Normocephalic and atraumatic.  Right Ear: External ear normal.  Left Ear: External ear normal.  Eyes: Conjunctivae and EOM are normal. Pupils are equal, round, and reactive to light.  Neck:  Neck supple. No thyromegaly present.  Cardiovascular: Normal rate and normal heart sounds. Exam reveals no friction rub.  No murmur heard. Cardiac rate controlled, no murmur or gallops, pacer/right chest wall Pulmonary/Chest: Breath sounds normal. No respiratory distress. No wheezes rales or rhonchi Abdominal: Soft. Bowel sounds are normal. He exhibits mild distension. There is no tenderness.  Musculoskeletal: He exhibits no edema.  Neurological: He is alert and oriented to person, place, and time. He has normal reflexes. He displays normal reflexes. No cranial nerve deficit. He exhibits normal muscle tone. Coordination normal.  Bilateral deltoids are 4-, biceps and triceps 4, HI 4+. HF 4-, KE 4, Ankles 4+. No sensory deficits.  Decreased truncal control and sitting balance.  Skin: Skin is warm and dry.  Psychiatric: He has a normal mood and affect. His behavior is normal. Judgment and thought content normal   Results for orders placed during the hospital encounter of 05/12/12 (from the past 48 hour(s))  CBC WITH DIFFERENTIAL     Status: Abnormal   Collection Time    05/12/12 11:58 AM      Result Value Range   WBC 15.0 (*) 4.0 - 10.5 K/uL   RBC 4.23  4.22 - 5.81 MIL/uL   Hemoglobin 13.7  13.0 - 17.0 g/dL   HCT 37.6 (*) 39.0 -   52.0 %   MCV 88.9  78.0 - 100.0 fL   MCH 32.4  26.0 - 34.0 pg   MCHC 36.4 (*) 30.0 - 36.0 g/dL   RDW 13.5  11.5 - 15.5 %   Platelets 228  150 - 400 K/uL   Neutrophils Relative 67  43 - 77 %   Lymphocytes Relative 17  12 - 46 %   Monocytes Relative 16 (*) 3 - 12 %   Eosinophils Relative 0  0 - 5 %   Basophils Relative 0  0 - 1 %   Neutro Abs 10.0 (*) 1.7 - 7.7 K/uL   Lymphs Abs 2.6  0.7 - 4.0 K/uL   Monocytes Absolute 2.4 (*) 0.1 - 1.0 K/uL   Eosinophils Absolute 0.0  0.0 - 0.7 K/uL   Basophils Absolute 0.0  0.0 - 0.1 K/uL   RBC Morphology ELLIPTOCYTES     WBC Morphology ATYPICAL LYMPHOCYTES    COMPREHENSIVE METABOLIC PANEL     Status: Abnormal    Collection Time    05/12/12 11:58 AM      Result Value Range   Sodium 140  135 - 145 mEq/L   Potassium 2.8 (*) 3.5 - 5.1 mEq/L   Chloride 101  96 - 112 mEq/L   CO2 24  19 - 32 mEq/L   Glucose, Bld 56 (*) 70 - 99 mg/dL   BUN 53 (*) 6 - 23 mg/dL   Creatinine, Ser 2.10 (*) 0.50 - 1.35 mg/dL   Calcium 9.8  8.4 - 10.5 mg/dL   Total Protein 6.6  6.0 - 8.3 g/dL   Albumin 3.2 (*) 3.5 - 5.2 g/dL   AST 21  0 - 37 U/L   ALT 17  0 - 53 U/L   Alkaline Phosphatase 86  39 - 117 U/L   Total Bilirubin 0.6  0.3 - 1.2 mg/dL   GFR calc non Af Amer 27 (*) >90 mL/min   GFR calc Af Amer 32 (*) >90 mL/min   Comment:            The eGFR has been calculated     using the CKD EPI equation.     This calculation has not been     validated in all clinical     situations.     eGFR's persistently     <90 mL/min signify     possible Chronic Kidney Disease.  LACTIC ACID, PLASMA     Status: None   Collection Time    05/12/12 11:58 AM      Result Value Range   Lactic Acid, Venous 1.6  0.5 - 2.2 mmol/L  MAGNESIUM     Status: None   Collection Time    05/12/12 11:58 AM      Result Value Range   Magnesium 2.0  1.5 - 2.5 mg/dL  URINALYSIS, ROUTINE W REFLEX MICROSCOPIC     Status: Abnormal   Collection Time    05/12/12 12:36 PM      Result Value Range   Color, Urine YELLOW  YELLOW   APPearance CLEAR  CLEAR   Specific Gravity, Urine 1.018  1.005 - 1.030   pH 5.5  5.0 - 8.0   Glucose, UA NEGATIVE  NEGATIVE mg/dL   Hgb urine dipstick NEGATIVE  NEGATIVE   Bilirubin Urine SMALL (*) NEGATIVE   Ketones, ur 15 (*) NEGATIVE mg/dL   Protein, ur NEGATIVE  NEGATIVE mg/dL   Urobilinogen, UA 0.2  0.0 - 1.0 mg/dL     Nitrite NEGATIVE  NEGATIVE   Leukocytes, UA SMALL (*) NEGATIVE  URINE MICROSCOPIC-ADD ON     Status: Abnormal   Collection Time    05/12/12 12:36 PM      Result Value Range   Squamous Epithelial / LPF FEW (*) RARE   WBC, UA 7-10  <3 WBC/hpf   RBC / HPF 0-2  <3 RBC/hpf   Bacteria, UA RARE  RARE    Casts HYALINE CASTS (*) NEGATIVE   Urine-Other MUCOUS PRESENT    GLUCOSE, CAPILLARY     Status: Abnormal   Collection Time    05/12/12  3:16 PM      Result Value Range   Glucose-Capillary 112 (*) 70 - 99 mg/dL  GLUCOSE, CAPILLARY     Status: None   Collection Time    05/12/12  4:44 PM      Result Value Range   Glucose-Capillary 88  70 - 99 mg/dL  OVA AND PARASITE EXAMINATION     Status: None   Collection Time    05/12/12  4:59 PM      Result Value Range   Specimen Description STOOL     Special Requests NONE     Ova and parasites NO OVA OR PARASITES SEEN MODERATE YEAST     Report Status 05/13/2012 FINAL    CLOSTRIDIUM DIFFICILE BY PCR     Status: Abnormal   Collection Time    05/12/12  4:59 PM      Result Value Range   C difficile by pcr POSITIVE (*) NEGATIVE   Comment: CRITICAL RESULT CALLED TO, READ BACK BY AND VERIFIED WITH:     K WALL RN 2103 05/12/12 A BROWNING  STOOL CULTURE     Status: None   Collection Time    05/12/12  4:59 PM      Result Value Range   Specimen Description STOOL     Special Requests NONE     Culture Culture reincubated for better growth     Report Status PENDING    GLUCOSE, CAPILLARY     Status: Abnormal   Collection Time    05/12/12 10:23 PM      Result Value Range   Glucose-Capillary 121 (*) 70 - 99 mg/dL   Comment 1 Notify RN    BASIC METABOLIC PANEL     Status: Abnormal   Collection Time    05/13/12  5:25 AM      Result Value Range   Sodium 140  135 - 145 mEq/L   Potassium 2.4 (*) 3.5 - 5.1 mEq/L   Comment: CRITICAL RESULT CALLED TO, READ BACK BY AND VERIFIED WITH:     FAIRING T RN 05/13/12 0726 COSTELLO B   Chloride 105  96 - 112 mEq/L   CO2 22  19 - 32 mEq/L   Glucose, Bld 98  70 - 99 mg/dL   BUN 41 (*) 6 - 23 mg/dL   Creatinine, Ser 1.36 (*) 0.50 - 1.35 mg/dL   Comment: DELTA CHECK NOTED   Calcium 9.5  8.4 - 10.5 mg/dL   GFR calc non Af Amer 46 (*) >90 mL/min   GFR calc Af Amer 54 (*) >90 mL/min   Comment:            The eGFR has  been calculated     using the CKD EPI equation.     This calculation has not been     validated in all clinical     situations.       eGFR's persistently     <90 mL/min signify     possible Chronic Kidney Disease.  CBC     Status: Abnormal   Collection Time    05/13/12  5:25 AM      Result Value Range   WBC 12.1 (*) 4.0 - 10.5 K/uL   RBC 3.98 (*) 4.22 - 5.81 MIL/uL   Hemoglobin 12.3 (*) 13.0 - 17.0 g/dL   HCT 35.1 (*) 39.0 - 52.0 %   MCV 88.2  78.0 - 100.0 fL   MCH 30.9  26.0 - 34.0 pg   MCHC 35.0  30.0 - 36.0 g/dL   RDW 13.7  11.5 - 15.5 %   Platelets 216  150 - 400 K/uL  GLUCOSE, CAPILLARY     Status: Abnormal   Collection Time    05/13/12  6:32 AM      Result Value Range   Glucose-Capillary 103 (*) 70 - 99 mg/dL   Comment 1 Notify RN     Comment 2 Documented in Chart    GLUCOSE, CAPILLARY     Status: Abnormal   Collection Time    05/13/12 11:30 AM      Result Value Range   Glucose-Capillary 135 (*) 70 - 99 mg/dL  GLUCOSE, CAPILLARY     Status: Abnormal   Collection Time    05/13/12  4:40 PM      Result Value Range   Glucose-Capillary 110 (*) 70 - 99 mg/dL  BASIC METABOLIC PANEL     Status: Abnormal   Collection Time    05/13/12  5:38 PM      Result Value Range   Sodium 143  135 - 145 mEq/L   Potassium 3.3 (*) 3.5 - 5.1 mEq/L   Comment: DELTA CHECK NOTED   Chloride 108  96 - 112 mEq/L   CO2 23  19 - 32 mEq/L   Glucose, Bld 125 (*) 70 - 99 mg/dL   BUN 32 (*) 6 - 23 mg/dL   Creatinine, Ser 1.14  0.50 - 1.35 mg/dL   Calcium 9.3  8.4 - 10.5 mg/dL   GFR calc non Af Amer 58 (*) >90 mL/min   GFR calc Af Amer 67 (*) >90 mL/min   Comment:            The eGFR has been calculated     using the CKD EPI equation.     This calculation has not been     validated in all clinical     situations.     eGFR's persistently     <90 mL/min signify     possible Chronic Kidney Disease.  GLUCOSE, CAPILLARY     Status: Abnormal   Collection Time    05/13/12 10:25 PM      Result  Value Range   Glucose-Capillary 118 (*) 70 - 99 mg/dL   Comment 1 Notify RN    BASIC METABOLIC PANEL     Status: Abnormal   Collection Time    05/14/12  4:55 AM      Result Value Range   Sodium 144  135 - 145 mEq/L   Potassium 3.8  3.5 - 5.1 mEq/L   Chloride 113 (*) 96 - 112 mEq/L   CO2 25  19 - 32 mEq/L   Glucose, Bld 106 (*) 70 - 99 mg/dL   BUN 24 (*) 6 - 23 mg/dL   Creatinine, Ser 1.08  0.50 - 1.35 mg/dL   Calcium 8.9  8.4 - 10.5   mg/dL   GFR calc non Af Amer 61 (*) >90 mL/min   GFR calc Af Amer 71 (*) >90 mL/min   Comment:            The eGFR has been calculated     using the CKD EPI equation.     This calculation has not been     validated in all clinical     situations.     eGFR's persistently     <90 mL/min signify     possible Chronic Kidney Disease.  CBC     Status: Abnormal   Collection Time    05/14/12  4:55 AM      Result Value Range   WBC 10.2  4.0 - 10.5 K/uL   RBC 3.61 (*) 4.22 - 5.81 MIL/uL   Hemoglobin 11.5 (*) 13.0 - 17.0 g/dL   HCT 32.3 (*) 39.0 - 52.0 %   MCV 89.5  78.0 - 100.0 fL   MCH 31.9  26.0 - 34.0 pg   MCHC 35.6  30.0 - 36.0 g/dL   RDW 14.1  11.5 - 15.5 %   Platelets 207  150 - 400 K/uL  GLUCOSE, CAPILLARY     Status: Abnormal   Collection Time    05/14/12  6:03 AM      Result Value Range   Glucose-Capillary 103 (*) 70 - 99 mg/dL   Dg Chest 2 View  05/12/2012  *RADIOLOGY REPORT*  Clinical Data: Vomiting, weakness, diarrhea, history hypertension, diabetes, coronary artery disease  CHEST - 2 VIEW  Comparison: 05/06/2011  Findings: Right subclavian sequential transveous pacemaker leads project within right atrium and right ventricle. Upper-normal size of cardiac silhouette. Calcified tortuous aorta. Mediastinal contours and pulmonary vascularity otherwise normal. Emphysematous changes with bibasilar scarring. No acute infiltrate, pleural effusion or pneumothorax. No acute osseous findings.  IMPRESSION: Question COPD with bibasilar scarring. No acute  abnormalities.   Original Report Authenticated By: Mark Boles, M.D.     Post Admission Physician Evaluation: 1. Functional deficits secondary  to severe deconditioning after sustained C difficile diarrhea. 2. Patient is admitted to receive collaborative, interdisciplinary care between the physiatrist, rehab nursing staff, and therapy team. 3. Patient's level of medical complexity and substantial therapy needs in context of that medical necessity cannot be provided at a lesser intensity of care such as a SNF. 4. Patient has experienced substantial functional loss from his/her baseline which was documented above under the "Functional History" and "Functional Status" headings.  Judging by the patient's diagnosis, physical exam, and functional history, the patient has potential for functional progress which will result in measurable gains while on inpatient rehab.  These gains will be of substantial and practical use upon discharge  in facilitating mobility and self-care at the household level. 5. Physiatrist will provide 24 hour management of medical needs as well as oversight of the therapy plan/treatment and provide guidance as appropriate regarding the interaction of the two. 6. 24 hour rehab nursing will assist with bladder management, bowel management, safety, skin/wound care, disease management, medication administration, pain management and patient education  and help integrate therapy concepts, techniques,education, etc. 7. PT will assess and treat for/with: Lower extremity strength, range of motion, stamina, balance, functional mobility, safety, adaptive techniques and equipment.   Goals are: mod I. 8. OT will assess and treat for/with: ADL's, functional mobility, safety, upper extremity strength, adaptive techniques and equipment.   Goals are: mod I. 9. SLP will assess and treat for/with: n/a.  Goals are: n/a.   10. Case Management and Social Worker will assess and treat for psychological issues  and discharge planning. 11. Team conference will be held weekly to assess progress toward goals and to determine barriers to discharge. 12. Patient will receive at least 3 hours of therapy per day at least 5 days per week. 13. ELOS: 7-10 days      Prognosis:  excellent   Medical Problem List and Plan: 1. Deconditioning after prolonged Clostridium difficile. Continue by mouth vancomycin as directed/ florastor 2. DVT Prophylaxis/Anticoagulation: Subcutaneous Lovenox. Monitor platelet counts and any signs of bleeding 3. Pain Management: Hydrocodone as needed. Monitor with increased mobility 4. Mood/depression. Lexapro 10 mg daily, Valium 10 mg every 8 hours as needed anxiety and Effexor 75 mg daily. Provide emotional support and positive reinforcement 5. Neuropsych: This patient is capable of making decisions on his/her own behalf. 6. Hypokalemia. Followup chemistries 7. BPH. Hytrin. Check PVRs x3 8. Decreased nutritional storage. Followup with dietary services 9. Hyperlipidemia. Zocor   Zachary T. Swartz, MD, FAAPMR  05/14/2012 

## 2012-05-14 NOTE — Progress Notes (Signed)
Rehab admissions - Evaluated for possible admission.  I will check with patient and his wife in am regarding rehab preferences.  Call me for questions.  #[phone]

## 2012-05-14 NOTE — Consult Note (Signed)
Physical Medicine and Rehabilitation Consult Reason for Consult: Deconditioning/Clostridium difficile Referring Physician: [facility]   HPI: Randall Odom is a 79 y.o. right-handed male with a history of hypertension, diabetes mellitus with peripheral neuropathy and coronary artery disease with pacemaker. Patient presented 05/12/2012 through the emergency department with progressive non-watery diarrhea that initially (per chart) started 5 days prior to admission with associated decreased appetite and generalized weakness with dull abdominal pain. Patient reports to me a 3 week hx of decline.  Patient stated recently seen a GI specialist and they had ordered stool studies but he was not sure which ones. Noted creatinine 2.21 and hypokalemic at 2.4. Clostridium difficile specimens 05/12/2012 positive and placed on Flagyl and vancomycin. Subcutaneous Lovenox added for DVT prophylaxis. Renal function continues to improve with gentle IV fluids latest creatinine 1.1 and 4 as well as potassium supplement for hypokalemia improved to 3.3. Physical therapy evaluation completed 05/13/2012 noted decrease in mobility as well as activity tolerance with recommendations of physical medicine rehabilitation consult to consider inpatient rehabilitation services.   Review of Systems  Gastrointestinal: Positive for abdominal pain and diarrhea.  Genitourinary: Positive for urgency.  Musculoskeletal: Positive for myalgias.  Neurological: Positive for weakness.  Psychiatric/Behavioral: Positive for depression.  All other systems reviewed and are negative.   Past Medical History  Diagnosis Date  . Hypertension   . Muscle spasm   . Diabetes mellitus   . Enlarged prostate   . CAD (coronary artery disease)   . Pacemaker    Past Surgical History  Procedure Laterality Date  . Pacemaker insertion     No family history on file. Social History:  reports that he has never smoked. He does not have any smokeless tobacco  history on file. He reports that he does not drink alcohol or use illicit drugs. Allergies: No Known Allergies Medications Prior to Admission  Medication Sig Dispense Refill  . aspirin EC 81 MG tablet Take 81 mg by mouth daily.       . Cholecalciferol (VITAMIN D) 2000 UNITS tablet Take 2,000 Units by mouth daily.      . ciprofloxacin (CIPRO) 500 MG tablet Take 500 mg by mouth 2 (two) times daily. Starting 2/19 for 5 days      . diazepam (VALIUM) 10 MG tablet Take 10 mg by mouth every 6 (six) hours as needed. anxiety       . diphenoxylate-atropine (LOMOTIL) 2.5-0.025 MG per tablet Take 1 tablet by mouth 4 (four) times daily as needed for diarrhea or loose stools.      Marland Kitchen escitalopram (LEXAPRO) 10 MG tablet Take 10 mg by mouth daily.      Marland Kitchen esomeprazole (NEXIUM) 40 MG capsule Take 40 mg by mouth daily before breakfast.        . fluticasone (FLONASE) 50 MCG/ACT nasal spray Place 2 sprays into the nose daily as needed for allergies.      Marland Kitchen glipiZIDE (GLUCOTROL) 5 MG tablet Take 5 mg by mouth 2 (two) times daily before a meal.      . lisinopril (PRINIVIL,ZESTRIL) 5 MG tablet Take 5 mg by mouth daily.      . Saw Palmetto, Serenoa repens, (SAW PALMETTO PO) Take 1 capsule by mouth 2 (two) times daily.      . simvastatin (ZOCOR) 40 MG tablet Take 40 mg by mouth at bedtime.        Marland Kitchen terazosin (HYTRIN) 5 MG capsule Take 5 mg by mouth at bedtime.      Marland Kitchen  spironolactone (ALDACTONE) 25 MG tablet Take 25 mg by mouth 2 (two) times daily.        Home: Home Living Lives With: Spouse Available Help at Discharge: Family;Available 24 hours/day Type of Home: House Home Access: Stairs to enter [employer] of Steps: 1 Entrance Stairs-Rails: None Home Layout: One level Bathroom Shower/Tub: Walk-in shower;Door [employer] Toilet: Standard Home Adaptive Equipment: Grab bars in shower;Hand-held shower hose;Quad cane;Straight cane;Built-in shower seat;Walker - rolling  Functional History: Prior  Function Meal Prep: Total Light Housekeeping: Total Able to Take Stairs?: Yes Driving: Yes (short distances) Vocation: Retired Comments: Production manager Status:  Mobility: Bed Mobility Rolling Right: 6: Modified independent (Device/Increase time);With rail Right Sidelying to Sit: 3: Mod assist;With rails;HOB elevated Sitting - Scoot to Edge of Bed: 3: Mod assist;With rail Sit to Supine: 5: Supervision;With rail;HOB flat Scooting to HOB: 5: Supervision;With rail;Other (comment) (bed in maximal trendelenberg) Transfers Transfers: Sit to Stand;Stand to Sit Sit to Stand: 2: Max assist;With upper extremity assist;With armrests;From bed;Without upper extremity assist Stand to Sit: 2: Max assist;Not tested (comment);With armrests;To bed;To elevated surface Ambulation/Gait Ambulation/Gait Assistance: Not tested (comment) (pt not ready )    ADL:    Cognition: Cognition Orientation Level: Oriented X4 Cognition Overall Cognitive Status: Appears within functional limits for tasks assessed/performed Orientation Level: Oriented X4 / Intact Behavior During Session: [place] for tasks performed Cognition - Other Comments: not specifically tested, but seemed WNL for tasks assessed.    Blood pressure 106/61, pulse 66, temperature 97.1 F (36.2 C), temperature source Oral, resp. rate 18, height 5\' 10"  (1.778 m), weight 87.272 kg (192 lb 6.4 oz), SpO2 97.00%. Physical Exam  Vitals reviewed. Constitutional: He is oriented to person, place, and time.  Appears fatigued  HENT:  Head: Normocephalic and atraumatic.  Right Ear: External ear normal.  Left Ear: External ear normal.  Eyes: Conjunctivae and EOM are normal. Pupils are equal, round, and reactive to light.  Neck: Neck supple. No thyromegaly present.  Cardiovascular: Normal rate and normal heart sounds.  Exam reveals no friction rub.   No murmur heard. Cardiac rate controlled  Pulmonary/Chest: Breath sounds normal. No respiratory  distress.  Abdominal: Soft. Bowel sounds are normal. He exhibits no distension. There is no tenderness.  Musculoskeletal: He exhibits no edema.  Neurological: He is alert and oriented to person, place, and time. He has normal reflexes. He displays normal reflexes. No cranial nerve deficit. He exhibits normal muscle tone. Coordination normal.  Proximal muscles 4-/5 UE and LE. Decreased truncal control and sitting balance.   Skin: Skin is warm and dry.  Psychiatric: He has a normal mood and affect. His behavior is normal. Judgment and thought content normal.    Results for orders placed during the hospital encounter of 05/12/12 (from the past 24 hour(s))  GLUCOSE, CAPILLARY     Status: Abnormal   Collection Time    05/13/12  6:32 AM      Result Value Range   Glucose-Capillary 103 (*) 70 - 99 mg/dL   Comment 1 Notify RN     Comment 2 Documented in Chart    GLUCOSE, CAPILLARY     Status: Abnormal   Collection Time    05/13/12 11:30 AM      Result Value Range   Glucose-Capillary 135 (*) 70 - 99 mg/dL  GLUCOSE, CAPILLARY     Status: Abnormal   Collection Time    05/13/12  4:40 PM      Result Value Range   Glucose-Capillary 110 (*)  70 - 99 mg/dL  BASIC METABOLIC PANEL     Status: Abnormal   Collection Time    05/13/12  5:38 PM      Result Value Range   Sodium 143  135 - 145 mEq/L   Potassium 3.3 (*) 3.5 - 5.1 mEq/L   Chloride 108  96 - 112 mEq/L   CO2 23  19 - 32 mEq/L   Glucose, Bld 125 (*) 70 - 99 mg/dL   BUN 32 (*) 6 - 23 mg/dL   Creatinine, Ser 4.09  0.50 - 1.35 mg/dL   Calcium 9.3  8.4 - 81.1 mg/dL   GFR calc non Af Amer 58 (*) >90 mL/min   GFR calc Af Amer 67 (*) >90 mL/min  GLUCOSE, CAPILLARY     Status: Abnormal   Collection Time    05/13/12 10:25 PM      Result Value Range   Glucose-Capillary 118 (*) 70 - 99 mg/dL   Comment 1 Notify RN    CBC     Status: Abnormal   Collection Time    05/14/12  4:55 AM      Result Value Range   WBC 10.2  4.0 - 10.5 K/uL   RBC 3.61  (*) 4.22 - 5.81 MIL/uL   Hemoglobin 11.5 (*) 13.0 - 17.0 g/dL   HCT 91.4 (*) 78.2 - 95.6 %   MCV 89.5  78.0 - 100.0 fL   MCH 31.9  26.0 - 34.0 pg   MCHC 35.6  30.0 - 36.0 g/dL   RDW 21.3  08.6 - 57.8 %   Platelets 207  150 - 400 K/uL   Dg Chest 2 View  05/12/2012  *RADIOLOGY REPORT*  Clinical Data: Vomiting, weakness, diarrhea, history hypertension, diabetes, coronary artery disease  CHEST - 2 VIEW  Comparison: 05/06/2011  Findings: Right subclavian sequential transveous pacemaker leads project within right atrium and right ventricle. Upper-normal size of cardiac silhouette. Calcified tortuous aorta. Mediastinal contours and pulmonary vascularity otherwise normal. Emphysematous changes with bibasilar scarring. No acute infiltrate, pleural effusion or pneumothorax. No acute osseous findings.  IMPRESSION: Question COPD with bibasilar scarring. No acute abnormalities.   Original Report Authenticated By: Ulyses Southward, M.D.     Assessment/Plan: Diagnosis: deconditioning after prolonged c diff diarrhea 1. Does the need for close, 24 hr/day medical supervision in concert with the patient's rehab needs make it unreasonable for this patient to be served in a less intensive setting? Yes 2. Co-Morbidities requiring supervision/potential complications: dm, CAD, htn 3. Due to bladder management, bowel management, safety, skin/wound care, disease management, medication administration, pain management and patient education, does the patient require 24 hr/day rehab nursing? Yes 4. Does the patient require coordinated care of a physician, rehab nurse, PT (1-2 hrs/day, 5 days/week) and OT (1-2 hrs/day, 5 days/week) to address physical and functional deficits in the context of the above medical diagnosis(es)? Yes Addressing deficits in the following areas: balance, endurance, locomotion, strength, transferring, bowel/bladder control, bathing, dressing, feeding, grooming, toileting and psychosocial support 5. Can the  patient actively participate in an intensive therapy program of at least 3 hrs of therapy per day at least 5 days per week? Yes 6. The potential for patient to make measurable gains while on inpatient rehab is excellent 7. Anticipated functional outcomes upon discharge from inpatient rehab are mod I with PT, mod I with OT, n/a with SLP. 8. Estimated rehab length of stay to reach the above functional goals is: 7 -12 days 9. Does the patient  have adequate social supports to accommodate these discharge functional goals? Yes 10. Anticipated D/C setting: Home 11. Anticipated post D/C treatments: HH therapy 12. Overall Rehab/Functional Prognosis: excellent  RECOMMENDATIONS: This patient's condition is appropriate for continued rehabilitative care in the following setting: CIR Patient has agreed to participate in recommended program. Yes Note that insurance prior authorization may be required for reimbursement for recommended care.  Comment: This patient reports to me a 3 week history of decline as his diarrhea progressed at home. He was unable to leave the house during this period. Subsequently, he has become quite deconditioned. He was regularly walking up to a mile before he became ill. Rehab RN to follow up.    Ivory Broad, MD     05/14/2012

## 2012-05-14 NOTE — Clinical Documentation Improvement (Signed)
MALNUTRITION DOCUMENTATION CLARIFICATION  CLINICAL DOCUMENTATION QUERIES ARE NOT PART OF THE PERMANENT MEDICAL RECORD  Please update your documentation within the medical record to reflect your response to this query.                                                                                         05/14/12   Dr. Waymon Amato and/ or Associates,  In a better effort to capture your patient's severity of illness, reflect appropriate length of stay and utilization of resources, a review of the patient medical record has revealed the following indicators:  Progress Notes signed by Tonye Becket, RD at 05/13/2012 9:38 AM  INITIAL NUTRITION ASSESSMENT  DOCUMENTATION CODES  Per approved criteria   -Non-severe (moderate) malnutrition in the context of acute illness or injury    Pt meets criteria for moderate malnutrition in the context of acute illness 2/2 to weight loss of 9.4% body weight in one month and meeting <75% estimated nutrition needs for > 1 week.   INTERVENTION:  1. Encouraged po intake as able.  2. Agree with multivitamin and Florastor supplement  3. RD will continue to follow  NUTRITION DIAGNOSIS:  Inadequate oral intake related to nausea/diarrhea as evidenced by weight loss.  Goal:  PO intake to meet >/=90% estimated nutrition needs  Monitor:  PO intake, I/O's, labs, weight trends  ASSESSMENT:  Pt admitted with C.Diff. Pt reports unable to eat well, less then one meal daily, and ongoing diarrhea x1 month. Pt reports 20 lb weight loss (9.4% body weight) in 1 month. Pt was able to take water, though some weight loss likely related to fluid status.  Reports was able to tolerate breakfast this morning, 100% meal completion documented.     Based on your clinical judgment, please document in the progress notes and discharge summary if a condition below provides greater specificity regarding the patient's nutritional status:   - Moderate Malnutrition in the context of Acute  Illness   - Other Condition   - Unable to Clinically Determine    In responding to this query please exercise your independent judgment.    The fact that a query is asked, does not imply that any particular answer is desired or expected.    Reviewed: see additional documentation.  Thank You,  Jerral Ralph  RN BSN CCDS Certified Clinical Documentation Specialist: Cell   [phone]  Health Information Management [place]   TO RESPOND TO THE THIS QUERY, FOLLOW THE INSTRUCTIONS BELOW:  1. If needed, update documentation for the patient's encounter via the notes activity.  2. Access this query again and click edit on the In [employer].  3. After updating, or not, click F2 to complete all highlighted (required) fields concerning your review. Select "additional documentation in the medical record" OR "no additional documentation provided".  4. Click Sign note button.  5. The deficiency will fall out of your In Basket *Please let us know if you are not able to complete this workflow by phone or e-mail (listed below).

## 2012-05-14 NOTE — Progress Notes (Addendum)
[facility] PROGRESS NOTE  Randall Odom WUJ:[id number] DOB: 01-Feb-1927 DOA: 05/12/2012 PCP: Hoyle Sauer, MD  Brief narrative Pt is 79 year old male who presents to the emergency department with main concern of progressive non watery diarrhea that initially started 5 days prior to admission and has been associated with poor oral intake and generalized, dull abdominal pain, generalized weakness. He reports over 10 episodes per day. He explains that he was seen recently by GI specialist and they have ordered stool studies but he is not sure which ones. Pt denies fevers, chill, other viral symptoms, no other abdominal concerns, no urinary concerns, no specifc focal neurological weakness.   In ED, pt found to have mild leukocytosis, hypokalemia, acute renal failure with Cr 2.21. [facility] asked to admit for further evaluation.   Assessment/Plan: 1. C. difficile colitis: Changed Flagyl to by mouth vancomycin. Continue probiotics. Monitor. Stool culture pending. Negative for O&P. Diarrhea seems to be decreasing overall-exact amount unknown. 2. Hypokalemia: Secondary to diarrhea. Repleted. Follow BMP in a.m. 3. Dehydration: Resolved. DC IV fluids. By mouth intake ad lib. 4. Acute renal failure: Likely secondary to diarrhea and dehydration. Resolved. Continue to hold lisinopril. 5. Type II DM: Reasonable inpatient control. Hold oral hypoglycemics. Continue SSI. 6. Hypertension: Soft blood pressures. Hold antihypertensives and monitor.  7. Anemia, possibly dilutional: Follow CBC in a.m.  Code Status: Full Family Communication: Discussed with patient Disposition Plan: CIR? 3/7.   Consultants:  Rehabilitation M.D.  Procedures:  None  Antibiotics:  IV Flagyl 05/12/12 > 05/13/12  By mouth vancomycin 05/13/12 >   HPI/Subjective: Last BM at 2 AM. States he had 3-4 BMs last night and 2 BMs yesterday. However per nursing, only one BM overnight. Weakness is better. States that he's gained 6-7  pounds in the hospital  Objective: Filed Vitals:   05/13/12 0609 05/13/12 1411 05/13/12 2142 05/14/12 0604  BP: 98/62 119/73 106/61 99/56  Pulse: 67 70 66 68  Temp: 98 F (36.7 C) 97.5 F (36.4 C) 97.1 F (36.2 C) 97.7 F (36.5 C)  TempSrc: Oral Oral Oral Oral  Resp: 18 18 18 19   Height:      Weight: 87.272 kg (192 lb 6.4 oz)   92.08 kg (203 lb)  SpO2: 98% 98% 97% 97%    Intake/Output Summary (Last 24 hours) at 05/14/12 0736 Last data filed at 05/14/12 0519  Gross per 24 hour  Intake 2796.67 ml  Output   1376 ml  Net 1420.67 ml   Filed Weights   05/12/12 1537 05/13/12 0609 05/14/12 0604  Weight: 86.41 kg (190 lb 8 oz) 87.272 kg (192 lb 6.4 oz) 92.08 kg (203 lb)    Exam:   General exam: Comfortable.  Respiratory system: Clear. No increased work of breathing.  Cardiovascular system: S1 & S2 heard, RRR. No JVD, murmurs, gallops, clicks and trace bilateral leg edema. Telemetry shows paced rhythm in the 60s  Gastrointestinal system: Abdomen is nondistended, soft and nontender. Normal bowel sounds heard.  Central nervous system: Alert and oriented. No focal neurological deficits.  Extremities: Symmetric 5 x 5 power.   Data Reviewed: Basic Metabolic Panel:  Recent Labs Lab 05/12/12 1158 05/13/12 0525 05/13/12 1738 05/14/12 0455  NA 140 140 143 144  K 2.8* 2.4* 3.3* 3.8  CL 101 105 108 113*  CO2 24 22 23 25   GLUCOSE 56* 98 125* 106*  BUN 53* 41* 32* 24*  CREATININE 2.10* 1.36* 1.14 1.08  CALCIUM 9.8 9.5 9.3 8.9  MG 2.0  --   --   --  Liver Function Tests:  Recent Labs Lab 05/12/12 1158  AST 21  ALT 17  ALKPHOS 86  BILITOT 0.6  PROT 6.6  ALBUMIN 3.2*   No results found for this basename: LIPASE, AMYLASE,  in the last 168 hours No results found for this basename: AMMONIA,  in the last 168 hours CBC:  Recent Labs Lab 05/12/12 1158 05/13/12 0525 05/14/12 0455  WBC 15.0* 12.1* 10.2  NEUTROABS 10.0*  --   --   HGB 13.7 12.3* 11.5*  HCT  37.6* 35.1* 32.3*  MCV 88.9 88.2 89.5  PLT 228 216 207   Cardiac Enzymes: No results found for this basename: CKTOTAL, CKMB, CKMBINDEX, TROPONINI,  in the last 168 hours BNP (last 3 results) No results found for this basename: PROBNP,  in the last 8760 hours CBG:  Recent Labs Lab 05/13/12 0632 05/13/12 1130 05/13/12 1640 05/13/12 2225 05/14/12 0603  GLUCAP 103* 135* 110* 118* 103*    Recent Results (from the past 240 hour(s))  OVA AND PARASITE EXAMINATION     Status: None   Collection Time    05/12/12  4:59 PM      Result Value Range Status   Specimen Description STOOL   Final   Special Requests NONE   Final   Ova and parasites NO OVA OR PARASITES SEEN MODERATE YEAST   Final   Report Status 05/13/2012 FINAL   Final  CLOSTRIDIUM DIFFICILE BY PCR     Status: Abnormal   Collection Time    05/12/12  4:59 PM      Result Value Range Status   C difficile by pcr POSITIVE (*) NEGATIVE Final   Comment: CRITICAL RESULT CALLED TO, READ BACK BY AND VERIFIED WITH:     K WALL RN 2103 05/12/12 A BROWNING  STOOL CULTURE     Status: None   Collection Time    05/12/12  4:59 PM      Result Value Range Status   Specimen Description STOOL   Final   Special Requests NONE   Final   Culture Culture reincubated for better growth   Final   Report Status PENDING   Incomplete     Studies: Dg Chest 2 View  05/12/2012  *RADIOLOGY REPORT*  Clinical Data: Vomiting, weakness, diarrhea, history hypertension, diabetes, coronary artery disease  CHEST - 2 VIEW  Comparison: 05/06/2011  Findings: Right subclavian sequential transveous pacemaker leads project within right atrium and right ventricle. Upper-normal size of cardiac silhouette. Calcified tortuous aorta. Mediastinal contours and pulmonary vascularity otherwise normal. Emphysematous changes with bibasilar scarring. No acute infiltrate, pleural effusion or pneumothorax. No acute osseous findings.  IMPRESSION: Question COPD with bibasilar scarring. No  acute abnormalities.   Original Report Authenticated By: Ulyses Southward, M.D.      Additional labs:   Scheduled Meds: . aspirin EC  81 mg Oral Daily  . enoxaparin (LOVENOX) injection  30 mg Subcutaneous Q24H  . escitalopram  10 mg Oral Daily  . fluticasone  2 spray Each Nare Daily  . multivitamin with minerals  1 tablet Oral Daily  . saccharomyces boulardii  250 mg Oral BID  . simvastatin  40 mg Oral QHS  . sodium chloride  3 mL Intravenous Q12H  . terazosin  5 mg Oral QHS  . vancomycin  125 mg Oral Q6H  . venlafaxine XR  75 mg Oral Daily   Continuous Infusions: . 0.45 % NaCl with KCl 20 mEq / L 100 mL/hr at 05/14/12 [phone]  Principal Problem:   C. difficile colitis Active Problems:   Diabetes mellitus   HTN (hypertension)   Diarrhea   Acute renal failure   Hypokalemia   Dehydration    Time spent: 35 minutes    [facility]  [facility] Pager [phone].   If 8PM-8AM, please contact night-coverage at [email or web address], password [email or web address] 05/14/2012, 7:36 AM  LOS: 2 days

## 2012-05-15 ENCOUNTER — Inpatient Hospital Stay (HOSPITAL_COMMUNITY)
Admission: RE | Admit: 2012-05-15 | Discharge: 2012-05-27 | DRG: 945 | Disposition: A | Discharge status: Home Health | Payer: Medicare Other | Source: Intra-hospital | Attending: Physical Medicine & Rehabilitation | Admitting: Physical Medicine & Rehabilitation

## 2012-05-15 DIAGNOSIS — E876 Hypokalemia: Secondary | ICD-10-CM | POA: Diagnosis not present

## 2012-05-15 DIAGNOSIS — E785 Hyperlipidemia, unspecified: Secondary | ICD-10-CM | POA: Diagnosis present

## 2012-05-15 DIAGNOSIS — A0472 Enterocolitis due to Clostridium difficile, not specified as recurrent: Secondary | ICD-10-CM

## 2012-05-15 DIAGNOSIS — N400 Enlarged prostate without lower urinary tract symptoms: Secondary | ICD-10-CM | POA: Diagnosis present

## 2012-05-15 DIAGNOSIS — E119 Type 2 diabetes mellitus without complications: Secondary | ICD-10-CM | POA: Diagnosis present

## 2012-05-15 DIAGNOSIS — R5381 Other malaise: Secondary | ICD-10-CM

## 2012-05-15 DIAGNOSIS — Z950 Presence of cardiac pacemaker: Secondary | ICD-10-CM

## 2012-05-15 DIAGNOSIS — F3289 Other specified depressive episodes: Secondary | ICD-10-CM | POA: Diagnosis present

## 2012-05-15 DIAGNOSIS — Z5189 Encounter for other specified aftercare: Principal | ICD-10-CM

## 2012-05-15 DIAGNOSIS — F411 Generalized anxiety disorder: Secondary | ICD-10-CM | POA: Diagnosis present

## 2012-05-15 DIAGNOSIS — I10 Essential (primary) hypertension: Secondary | ICD-10-CM | POA: Diagnosis present

## 2012-05-15 DIAGNOSIS — E860 Dehydration: Secondary | ICD-10-CM | POA: Diagnosis present

## 2012-05-15 DIAGNOSIS — I2510 Atherosclerotic heart disease of native coronary artery without angina pectoris: Secondary | ICD-10-CM | POA: Diagnosis present

## 2012-05-15 DIAGNOSIS — N179 Acute kidney failure, unspecified: Secondary | ICD-10-CM | POA: Diagnosis present

## 2012-05-15 LAB — CREATININE, SERUM: Creatinine, Ser: 0.82 mg/dL (ref 0.50–1.35)

## 2012-05-15 LAB — CBC
Hemoglobin: 12 g/dL — ABNORMAL LOW (ref 13.0–17.0)
MCH: 31.6 pg (ref 26.0–34.0)
MCH: 32 pg (ref 26.0–34.0)
MCHC: 34.2 g/dL (ref 30.0–36.0)
MCHC: 34.4 g/dL (ref 30.0–36.0)
Platelets: 226 10*3/uL (ref 150–400)
RBC: 3.73 MIL/uL — ABNORMAL LOW (ref 4.22–5.81)
RDW: 14.2 % (ref 11.5–15.5)

## 2012-05-15 LAB — BASIC METABOLIC PANEL
Calcium: 8.8 mg/dL (ref 8.4–10.5)
GFR calc Af Amer: 90 mL/min — ABNORMAL LOW (ref >90)
GFR calc non Af Amer: 78 mL/min — ABNORMAL LOW (ref >90)
Glucose, Bld: 128 mg/dL — ABNORMAL HIGH (ref 70–99)
Sodium: 143 mEq/L (ref 135–145)

## 2012-05-15 LAB — GLUCOSE, CAPILLARY

## 2012-05-15 MED ORDER — VANCOMYCIN 50 MG/ML ORAL SOLUTION: 125.0000 mg | Freq: Four times a day (QID) | ORAL | Status: DC

## 2012-05-15 MED ORDER — ENOXAPARIN SODIUM 40 MG/0.4ML ~~LOC~~ SOLN
40.0000 mg | SUBCUTANEOUS | Status: DC
  Administered 2012-05-15 – 2012-05-26 (×12): 40 mg via SUBCUTANEOUS
  Filled 2012-05-15 (×13): qty 0.4

## 2012-05-15 MED ORDER — POTASSIUM CHLORIDE CRYS ER 20 MEQ PO TBCR
40.0000 meq | EXTENDED_RELEASE_TABLET | Freq: Once | ORAL | Status: AC
  Administered 2012-05-15: 40 meq via ORAL
  Filled 2012-05-15: qty 2

## 2012-05-15 MED ORDER — DIAZEPAM 5 MG PO TABS: 10.0000 mg | ORAL_TABLET | Freq: Three times a day (TID) | ORAL | Status: DC | PRN

## 2012-05-15 MED ORDER — SACCHAROMYCES BOULARDII 250 MG PO CAPS: 250.0000 mg | ORAL_CAPSULE | Freq: Two times a day (BID) | ORAL | Status: DC

## 2012-05-15 MED ORDER — ASPIRIN EC 81 MG PO TBEC
81.0000 mg | DELAYED_RELEASE_TABLET | Freq: Every day | ORAL | Status: DC
  Administered 2012-05-16 – 2012-05-27 (×12): 81 mg via ORAL
  Filled 2012-05-15 (×15): qty 1

## 2012-05-15 MED ORDER — SIMVASTATIN 40 MG PO TABS
40.0000 mg | ORAL_TABLET | Freq: Every day | ORAL | Status: DC
  Administered 2012-05-15 – 2012-05-26 (×12): 40 mg via ORAL
  Filled 2012-05-15 (×14): qty 1

## 2012-05-15 MED ORDER — ESCITALOPRAM OXALATE 10 MG PO TABS
10.0000 mg | ORAL_TABLET | Freq: Every day | ORAL | Status: DC
  Administered 2012-05-16 – 2012-05-27 (×12): 10 mg via ORAL
  Filled 2012-05-15 (×14): qty 1

## 2012-05-15 MED ORDER — VANCOMYCIN 50 MG/ML ORAL SOLUTION
125.0000 mg | Freq: Four times a day (QID) | ORAL | Status: DC
  Administered 2012-05-15 – 2012-05-27 (×46): 125 mg via ORAL
  Filled 2012-05-15 (×51): qty 2.5

## 2012-05-15 MED ORDER — SACCHAROMYCES BOULARDII 250 MG PO CAPS
250.0000 mg | ORAL_CAPSULE | Freq: Two times a day (BID) | ORAL | Status: DC
  Administered 2012-05-15 – 2012-05-17 (×5): 250 mg via ORAL
  Filled 2012-05-15 (×8): qty 1

## 2012-05-15 MED ORDER — HYDROCODONE-ACETAMINOPHEN 5-325 MG PO TABS
1.0000 | ORAL_TABLET | ORAL | Status: DC | PRN
  Administered 2012-05-17 (×2): 1 via ORAL
  Administered 2012-05-17: 2 via ORAL
  Administered 2012-05-18: 1 via ORAL
  Administered 2012-05-19 – 2012-05-26 (×15): 2 via ORAL
  Filled 2012-05-15 (×4): qty 2
  Filled 2012-05-15: qty 1
  Filled 2012-05-15 (×6): qty 2
  Filled 2012-05-15: qty 1
  Filled 2012-05-15 (×2): qty 2
  Filled 2012-05-15: qty 1
  Filled 2012-05-15 (×6): qty 2

## 2012-05-15 MED ORDER — ONDANSETRON HCL 4 MG PO TABS
4.0000 mg | ORAL_TABLET | Freq: Four times a day (QID) | ORAL | Status: DC | PRN
  Administered 2012-05-26: 4 mg via ORAL
  Filled 2012-05-15: qty 1

## 2012-05-15 MED ORDER — VENLAFAXINE HCL ER 75 MG PO CP24
75.0000 mg | ORAL_CAPSULE | Freq: Every day | ORAL | Status: DC
  Administered 2012-05-16 – 2012-05-27 (×12): 75 mg via ORAL
  Filled 2012-05-15 (×16): qty 1

## 2012-05-15 MED ORDER — ONDANSETRON HCL 4 MG/2ML IJ SOLN
4.0000 mg | Freq: Four times a day (QID) | INTRAMUSCULAR | Status: DC | PRN
  Filled 2012-05-15: qty 2

## 2012-05-15 MED ORDER — ADULT MULTIVITAMIN W/MINERALS CH
1.0000 | ORAL_TABLET | Freq: Every day | ORAL | Status: DC
  Administered 2012-05-16 – 2012-05-27 (×12): 1 via ORAL
  Filled 2012-05-15 (×15): qty 1

## 2012-05-15 MED ORDER — SORBITOL 70 % SOLN: 30.0000 mL | Freq: Every day | Status: DC | PRN

## 2012-05-15 MED ORDER — ENOXAPARIN SODIUM 30 MG/0.3ML ~~LOC~~ SOLN: 40.0000 mg | SUBCUTANEOUS | Status: DC

## 2012-05-15 MED ORDER — ACETAMINOPHEN 325 MG PO TABS: 325.0000 mg | ORAL_TABLET | ORAL | Status: DC | PRN

## 2012-05-15 MED ORDER — TERAZOSIN HCL 5 MG PO CAPS
5.0000 mg | ORAL_CAPSULE | Freq: Every day | ORAL | Status: DC
  Administered 2012-05-15 – 2012-05-26 (×12): 5 mg via ORAL
  Filled 2012-05-15 (×14): qty 1

## 2012-05-15 MED ORDER — FLUTICASONE PROPIONATE 50 MCG/ACT NA SUSP
2.0000 | Freq: Every day | NASAL | Status: DC
  Administered 2012-05-16 – 2012-05-27 (×6): 2 via NASAL
  Filled 2012-05-15: qty 16

## 2012-05-15 NOTE — H&P (View-Only) (Signed)
Physical Medicine and Rehabilitation Admission H&P    Chief Complaint  Patient presents with  . Emesis  . Weakness  . Diarrhea  : HPI: Randall Odom is a 79 y.o. right-handed male with a history of hypertension, diabetes mellitus with peripheral neuropathy and coronary artery disease with pacemaker. Patient independent active until approximately 3 weeks ago. Patient presented 05/12/2012 through the emergency department with progressive non-watery diarrhea that initially (per chart) started 5 days prior to admission with associated decreased appetite and generalized weakness with dull abdominal pain. Patient reports to me a 3 week hx of decline. Patient stated recently seen a GI specialist and they had ordered stool studies but he was not sure which ones. Noted creatinine 2.21 and hypokalemic at 2.4. Clostridium difficile specimens 05/12/2012 positive and placed on Flagyl until 05/13/2012 and and change to by mouth vancomycin. Subcutaneous Lovenox added for DVT prophylaxis. Renal function continues to improve with gentle IV fluids latest creatinine 1.1 and 4 as well as potassium supplement for hypokalemia improved to 3.3. Physical therapy evaluation completed 05/13/2012 noted decrease in mobility as well as activity tolerance with recommendations of physical medicine rehabilitation consult to consider inpatient rehabilitation services. Patient was felt to be a candidate for inpatient rehabilitation services and was admitted for comprehensive rehabilitation program  Review of Systems  Gastrointestinal: Positive for abdominal pain and diarrhea.  Genitourinary: Positive for urgency.  Musculoskeletal: Positive for myalgias.  Neurological: Positive for weakness.  Psychiatric/Behavioral: Positive for depression.  All other systems reviewed and are negative   Past Medical History  Diagnosis Date  . Hypertension   . Muscle spasm   . Diabetes mellitus   . Enlarged prostate   . CAD (coronary artery  disease)   . Pacemaker    Past Surgical History  Procedure Laterality Date  . Pacemaker insertion     No family history on file. Social History:  reports that he has never smoked. He does not have any smokeless tobacco history on file. He reports that he does not drink alcohol or use illicit drugs. Allergies: No Known Allergies Medications Prior to Admission  Medication Sig Dispense Refill  . aspirin EC 81 MG tablet Take 81 mg by mouth daily.       . Cholecalciferol (VITAMIN D) 2000 UNITS tablet Take 2,000 Units by mouth daily.      . ciprofloxacin (CIPRO) 500 MG tablet Take 500 mg by mouth 2 (two) times daily. Starting 2/19 for 5 days      . diazepam (VALIUM) 10 MG tablet Take 10 mg by mouth every 6 (six) hours as needed. anxiety       . diphenoxylate-atropine (LOMOTIL) 2.5-0.025 MG per tablet Take 1 tablet by mouth 4 (four) times daily as needed for diarrhea or loose stools.      Marland Kitchen escitalopram (LEXAPRO) 10 MG tablet Take 10 mg by mouth daily.      Marland Kitchen esomeprazole (NEXIUM) 40 MG capsule Take 40 mg by mouth daily before breakfast.        . fluticasone (FLONASE) 50 MCG/ACT nasal spray Place 2 sprays into the nose daily as needed for allergies.      Marland Kitchen glipiZIDE (GLUCOTROL) 5 MG tablet Take 5 mg by mouth 2 (two) times daily before a meal.      . lisinopril (PRINIVIL,ZESTRIL) 5 MG tablet Take 5 mg by mouth daily.      . Saw Palmetto, Serenoa repens, (SAW PALMETTO PO) Take 1 capsule by mouth 2 (two) times daily.      Marland Kitchen  simvastatin (ZOCOR) 40 MG tablet Take 40 mg by mouth at bedtime.        Marland Kitchen terazosin (HYTRIN) 5 MG capsule Take 5 mg by mouth at bedtime.      Marland Kitchen spironolactone (ALDACTONE) 25 MG tablet Take 25 mg by mouth 2 (two) times daily.        Home: Home Living Lives With: Spouse Available Help at Discharge: Family;Available 24 hours/day Type of Home: House Home Access: Stairs to enter [employer] of Steps: 1 Entrance Stairs-Rails: None Home Layout: One level Bathroom  Shower/Tub: Walk-in shower;Door [employer] Toilet: Standard Home Adaptive Equipment: Grab bars in shower;Hand-held shower hose;Quad cane;Straight cane;Built-in shower seat;Walker - rolling   Functional History: Prior Function Meal Prep: Total Light Housekeeping: Total Able to Take Stairs?: Yes Driving: Yes (short distances) Vocation: Retired Comments: Nature conservation officer Status:  Mobility: Bed Mobility Rolling Right: 6: Modified independent (Device/Increase time);With rail Right Sidelying to Sit: 3: Mod assist;With rails;HOB elevated Sitting - Scoot to Edge of Bed: 3: Mod assist;With rail Sit to Supine: 5: Supervision;With rail;HOB flat Scooting to HOB: 5: Supervision;With rail;Other (comment) (bed in maximal trendelenberg) Transfers Transfers: Sit to Stand;Stand to Sit Sit to Stand: 2: Max assist;With upper extremity assist;With armrests;From bed;Without upper extremity assist Stand to Sit: 2: Max assist;Not tested (comment);With armrests;To bed;To elevated surface Ambulation/Gait Ambulation/Gait Assistance: Not tested (comment) (pt not ready )    ADL:    Cognition: Cognition Orientation Level: Oriented X4 Cognition Overall Cognitive Status: Appears within functional limits for tasks assessed/performed Orientation Level: Oriented X4 / Intact Behavior During Session: [facility] for tasks performed Cognition - Other Comments: not specifically tested, but seemed WNL for tasks assessed.    Physical Exam: Blood pressure 99/56, pulse 68, temperature 97.7 F (36.5 C), temperature source Oral, resp. rate 19, height 5\' 10"  (1.778 m), weight 92.08 kg (203 lb), SpO2 97.00%. Physical Exam  Vitals reviewed.  Constitutional: He is oriented to person, place, and time.  Appears fatigued  HENT:  Head: Normocephalic and atraumatic.  Right Ear: External ear normal.  Left Ear: External ear normal.  Eyes: Conjunctivae and EOM are normal. Pupils are equal, round, and reactive to light.  Neck:  Neck supple. No thyromegaly present.  Cardiovascular: Normal rate and normal heart sounds. Exam reveals no friction rub.  No murmur heard. Cardiac rate controlled, no murmur or gallops, pacer/right chest wall Pulmonary/Chest: Breath sounds normal. No respiratory distress. No wheezes rales or rhonchi Abdominal: Soft. Bowel sounds are normal. He exhibits mild distension. There is no tenderness.  Musculoskeletal: He exhibits no edema.  Neurological: He is alert and oriented to person, place, and time. He has normal reflexes. He displays normal reflexes. No cranial nerve deficit. He exhibits normal muscle tone. Coordination normal.  Bilateral deltoids are 4-, biceps and triceps 4, HI 4+. HF 4-, KE 4, Ankles 4+. No sensory deficits.  Decreased truncal control and sitting balance.  Skin: Skin is warm and dry.  Psychiatric: He has a normal mood and affect. His behavior is normal. Judgment and thought content normal   Results for orders placed during the hospital encounter of 05/12/12 (from the past 48 hour(s))  CBC WITH DIFFERENTIAL     Status: Abnormal   Collection Time    05/12/12 11:58 AM      Result Value Range   WBC 15.0 (*) 4.0 - 10.5 K/uL   RBC 4.23  4.22 - 5.81 MIL/uL   Hemoglobin 13.7  13.0 - 17.0 g/dL   HCT 16.1 (*) 09.6 -  52.0 %   MCV 88.9  78.0 - 100.0 fL   MCH 32.4  26.0 - 34.0 pg   MCHC 36.4 (*) 30.0 - 36.0 g/dL   RDW 16.1  09.6 - 04.5 %   Platelets 228  150 - 400 K/uL   Neutrophils Relative 67  43 - 77 %   Lymphocytes Relative 17  12 - 46 %   Monocytes Relative 16 (*) 3 - 12 %   Eosinophils Relative 0  0 - 5 %   Basophils Relative 0  0 - 1 %   Neutro Abs 10.0 (*) 1.7 - 7.7 K/uL   Lymphs Abs 2.6  0.7 - 4.0 K/uL   Monocytes Absolute 2.4 (*) 0.1 - 1.0 K/uL   Eosinophils Absolute 0.0  0.0 - 0.7 K/uL   Basophils Absolute 0.0  0.0 - 0.1 K/uL   RBC Morphology ELLIPTOCYTES     WBC Morphology ATYPICAL LYMPHOCYTES    COMPREHENSIVE METABOLIC PANEL     Status: Abnormal    Collection Time    05/12/12 11:58 AM      Result Value Range   Sodium 140  135 - 145 mEq/L   Potassium 2.8 (*) 3.5 - 5.1 mEq/L   Chloride 101  96 - 112 mEq/L   CO2 24  19 - 32 mEq/L   Glucose, Bld 56 (*) 70 - 99 mg/dL   BUN 53 (*) 6 - 23 mg/dL   Creatinine, Ser 4.09 (*) 0.50 - 1.35 mg/dL   Calcium 9.8  8.4 - 81.1 mg/dL   Total Protein 6.6  6.0 - 8.3 g/dL   Albumin 3.2 (*) 3.5 - 5.2 g/dL   AST 21  0 - 37 U/L   ALT 17  0 - 53 U/L   Alkaline Phosphatase 86  39 - 117 U/L   Total Bilirubin 0.6  0.3 - 1.2 mg/dL   GFR calc non Af Amer 27 (*) >90 mL/min   GFR calc Af Amer 32 (*) >90 mL/min   Comment:            The eGFR has been calculated     using the CKD EPI equation.     This calculation has not been     validated in all clinical     situations.     eGFR's persistently     <90 mL/min signify     possible Chronic Kidney Disease.  LACTIC ACID, PLASMA     Status: None   Collection Time    05/12/12 11:58 AM      Result Value Range   Lactic Acid, Venous 1.6  0.5 - 2.2 mmol/L  MAGNESIUM     Status: None   Collection Time    05/12/12 11:58 AM      Result Value Range   Magnesium 2.0  1.5 - 2.5 mg/dL  URINALYSIS, ROUTINE W REFLEX MICROSCOPIC     Status: Abnormal   Collection Time    05/12/12 12:36 PM      Result Value Range   Color, Urine YELLOW  YELLOW   APPearance CLEAR  CLEAR   Specific Gravity, Urine 1.018  1.005 - 1.030   pH 5.5  5.0 - 8.0   Glucose, UA NEGATIVE  NEGATIVE mg/dL   Hgb urine dipstick NEGATIVE  NEGATIVE   Bilirubin Urine SMALL (*) NEGATIVE   Ketones, ur 15 (*) NEGATIVE mg/dL   Protein, ur NEGATIVE  NEGATIVE mg/dL   Urobilinogen, UA 0.2  0.0 - 1.0 mg/dL  Nitrite NEGATIVE  NEGATIVE   Leukocytes, UA SMALL (*) NEGATIVE  URINE MICROSCOPIC-ADD ON     Status: Abnormal   Collection Time    05/12/12 12:36 PM      Result Value Range   Squamous Epithelial / LPF FEW (*) RARE   WBC, UA 7-10  <3 WBC/hpf   RBC / HPF 0-2  <3 RBC/hpf   Bacteria, UA RARE  RARE    Casts HYALINE CASTS (*) NEGATIVE   Urine-Other MUCOUS PRESENT    GLUCOSE, CAPILLARY     Status: Abnormal   Collection Time    05/12/12  3:16 PM      Result Value Range   Glucose-Capillary 112 (*) 70 - 99 mg/dL  GLUCOSE, CAPILLARY     Status: None   Collection Time    05/12/12  4:44 PM      Result Value Range   Glucose-Capillary 88  70 - 99 mg/dL  OVA AND PARASITE EXAMINATION     Status: None   Collection Time    05/12/12  4:59 PM      Result Value Range   Specimen Description STOOL     Special Requests NONE     Ova and parasites NO OVA OR PARASITES SEEN MODERATE YEAST     Report Status 05/13/2012 FINAL    CLOSTRIDIUM DIFFICILE BY PCR     Status: Abnormal   Collection Time    05/12/12  4:59 PM      Result Value Range   C difficile by pcr POSITIVE (*) NEGATIVE   Comment: CRITICAL RESULT CALLED TO, READ BACK BY AND VERIFIED WITH:     K WALL RN 2103 05/12/12 A BROWNING  STOOL CULTURE     Status: None   Collection Time    05/12/12  4:59 PM      Result Value Range   Specimen Description STOOL     Special Requests NONE     Culture Culture reincubated for better growth     Report Status PENDING    GLUCOSE, CAPILLARY     Status: Abnormal   Collection Time    05/12/12 10:23 PM      Result Value Range   Glucose-Capillary 121 (*) 70 - 99 mg/dL   Comment 1 Notify RN    BASIC METABOLIC PANEL     Status: Abnormal   Collection Time    05/13/12  5:25 AM      Result Value Range   Sodium 140  135 - 145 mEq/L   Potassium 2.4 (*) 3.5 - 5.1 mEq/L   Comment: CRITICAL RESULT CALLED TO, READ BACK BY AND VERIFIED WITH:     FAIRING T RN 05/13/12 0726 COSTELLO B   Chloride 105  96 - 112 mEq/L   CO2 22  19 - 32 mEq/L   Glucose, Bld 98  70 - 99 mg/dL   BUN 41 (*) 6 - 23 mg/dL   Creatinine, Ser 7.82 (*) 0.50 - 1.35 mg/dL   Comment: DELTA CHECK NOTED   Calcium 9.5  8.4 - 10.5 mg/dL   GFR calc non Af Amer 46 (*) >90 mL/min   GFR calc Af Amer 54 (*) >90 mL/min   Comment:            The eGFR has  been calculated     using the CKD EPI equation.     This calculation has not been     validated in all clinical     situations.  eGFR's persistently     <90 mL/min signify     possible Chronic Kidney Disease.  CBC     Status: Abnormal   Collection Time    05/13/12  5:25 AM      Result Value Range   WBC 12.1 (*) 4.0 - 10.5 K/uL   RBC 3.98 (*) 4.22 - 5.81 MIL/uL   Hemoglobin 12.3 (*) 13.0 - 17.0 g/dL   HCT 16.1 (*) 09.6 - 04.5 %   MCV 88.2  78.0 - 100.0 fL   MCH 30.9  26.0 - 34.0 pg   MCHC 35.0  30.0 - 36.0 g/dL   RDW 40.9  81.1 - 91.4 %   Platelets 216  150 - 400 K/uL  GLUCOSE, CAPILLARY     Status: Abnormal   Collection Time    05/13/12  6:32 AM      Result Value Range   Glucose-Capillary 103 (*) 70 - 99 mg/dL   Comment 1 Notify RN     Comment 2 Documented in Chart    GLUCOSE, CAPILLARY     Status: Abnormal   Collection Time    05/13/12 11:30 AM      Result Value Range   Glucose-Capillary 135 (*) 70 - 99 mg/dL  GLUCOSE, CAPILLARY     Status: Abnormal   Collection Time    05/13/12  4:40 PM      Result Value Range   Glucose-Capillary 110 (*) 70 - 99 mg/dL  BASIC METABOLIC PANEL     Status: Abnormal   Collection Time    05/13/12  5:38 PM      Result Value Range   Sodium 143  135 - 145 mEq/L   Potassium 3.3 (*) 3.5 - 5.1 mEq/L   Comment: DELTA CHECK NOTED   Chloride 108  96 - 112 mEq/L   CO2 23  19 - 32 mEq/L   Glucose, Bld 125 (*) 70 - 99 mg/dL   BUN 32 (*) 6 - 23 mg/dL   Creatinine, Ser 7.82  0.50 - 1.35 mg/dL   Calcium 9.3  8.4 - 95.6 mg/dL   GFR calc non Af Amer 58 (*) >90 mL/min   GFR calc Af Amer 67 (*) >90 mL/min   Comment:            The eGFR has been calculated     using the CKD EPI equation.     This calculation has not been     validated in all clinical     situations.     eGFR's persistently     <90 mL/min signify     possible Chronic Kidney Disease.  GLUCOSE, CAPILLARY     Status: Abnormal   Collection Time    05/13/12 10:25 PM      Result  Value Range   Glucose-Capillary 118 (*) 70 - 99 mg/dL   Comment 1 Notify RN    BASIC METABOLIC PANEL     Status: Abnormal   Collection Time    05/14/12  4:55 AM      Result Value Range   Sodium 144  135 - 145 mEq/L   Potassium 3.8  3.5 - 5.1 mEq/L   Chloride 113 (*) 96 - 112 mEq/L   CO2 25  19 - 32 mEq/L   Glucose, Bld 106 (*) 70 - 99 mg/dL   BUN 24 (*) 6 - 23 mg/dL   Creatinine, Ser 2.13  0.50 - 1.35 mg/dL   Calcium 8.9  8.4 - 08.6  mg/dL   GFR calc non Af Amer 61 (*) >90 mL/min   GFR calc Af Amer 71 (*) >90 mL/min   Comment:            The eGFR has been calculated     using the CKD EPI equation.     This calculation has not been     validated in all clinical     situations.     eGFR's persistently     <90 mL/min signify     possible Chronic Kidney Disease.  CBC     Status: Abnormal   Collection Time    05/14/12  4:55 AM      Result Value Range   WBC 10.2  4.0 - 10.5 K/uL   RBC 3.61 (*) 4.22 - 5.81 MIL/uL   Hemoglobin 11.5 (*) 13.0 - 17.0 g/dL   HCT 65.7 (*) 84.6 - 96.2 %   MCV 89.5  78.0 - 100.0 fL   MCH 31.9  26.0 - 34.0 pg   MCHC 35.6  30.0 - 36.0 g/dL   RDW 95.2  84.1 - 32.4 %   Platelets 207  150 - 400 K/uL  GLUCOSE, CAPILLARY     Status: Abnormal   Collection Time    05/14/12  6:03 AM      Result Value Range   Glucose-Capillary 103 (*) 70 - 99 mg/dL   Dg Chest 2 View  4/0/1027  *RADIOLOGY REPORT*  Clinical Data: Vomiting, weakness, diarrhea, history hypertension, diabetes, coronary artery disease  CHEST - 2 VIEW  Comparison: 05/06/2011  Findings: Right subclavian sequential transveous pacemaker leads project within right atrium and right ventricle. Upper-normal size of cardiac silhouette. Calcified tortuous aorta. Mediastinal contours and pulmonary vascularity otherwise normal. Emphysematous changes with bibasilar scarring. No acute infiltrate, pleural effusion or pneumothorax. No acute osseous findings.  IMPRESSION: Question COPD with bibasilar scarring. No acute  abnormalities.   Original Report Authenticated By: Ulyses Southward, M.D.     Post Admission Physician Evaluation: 1. Functional deficits secondary  to severe deconditioning after sustained C difficile diarrhea. 2. Patient is admitted to receive collaborative, interdisciplinary care between the physiatrist, rehab nursing staff, and therapy team. 3. Patient's level of medical complexity and substantial therapy needs in context of that medical necessity cannot be provided at a lesser intensity of care such as a SNF. 4. Patient has experienced substantial functional loss from his/her baseline which was documented above under the "Functional History" and "Functional Status" headings.  Judging by the patient's diagnosis, physical exam, and functional history, the patient has potential for functional progress which will result in measurable gains while on inpatient rehab.  These gains will be of substantial and practical use upon discharge  in facilitating mobility and self-care at the household level. 5. Physiatrist will provide 24 hour management of medical needs as well as oversight of the therapy plan/treatment and provide guidance as appropriate regarding the interaction of the two. 6. 24 hour rehab nursing will assist with bladder management, bowel management, safety, skin/wound care, disease management, medication administration, pain management and patient education  and help integrate therapy concepts, techniques,education, etc. 7. PT will assess and treat for/with: Lower extremity strength, range of motion, stamina, balance, functional mobility, safety, adaptive techniques and equipment.   Goals are: mod I. 8. OT will assess and treat for/with: ADL's, functional mobility, safety, upper extremity strength, adaptive techniques and equipment.   Goals are: mod I. 9. SLP will assess and treat for/with: n/a.  Goals are: n/a.  10. Case Management and Social Worker will assess and treat for psychological issues  and discharge planning. 11. Team conference will be held weekly to assess progress toward goals and to determine barriers to discharge. 12. Patient will receive at least 3 hours of therapy per day at least 5 days per week. 13. ELOS: 7-10 days      Prognosis:  excellent   Medical Problem List and Plan: 1. Deconditioning after prolonged Clostridium difficile. Continue by mouth vancomycin as directed/ florastor 2. DVT Prophylaxis/Anticoagulation: Subcutaneous Lovenox. Monitor platelet counts and any signs of bleeding 3. Pain Management: Hydrocodone as needed. Monitor with increased mobility 4. Mood/depression. Lexapro 10 mg daily, Valium 10 mg every 8 hours as needed anxiety and Effexor 75 mg daily. Provide emotional support and positive reinforcement 5. Neuropsych: This patient is capable of making decisions on his/her own behalf. 6. Hypokalemia. Followup chemistries 7. BPH. Hytrin. Check PVRs x3 8. Decreased nutritional storage. Followup with dietary services 9. Hyperlipidemia. Zocor   Ranelle Oyster, MD, [place] [facility]  05/14/2012

## 2012-05-15 NOTE — PMR Pre-admission (Signed)
PMR Admission Coordinator Pre-Admission Assessment  Patient: Randall Odom is an 79 y.o., male MRN: [id number] DOB: 1926/08/13 Height: 5\' 10"  (177.8 cm) Weight: 92.5 kg (203 lb 14.8 oz) (scale A)              Insurance Information HMO:      PPO:       PCP:       IPA:       80/20:       OTHER:   PRIMARY: Medicare A/B      Policy#: [id number]      Subscriber: Hal Neer CM Name:        Phone#:       Fax#:   Pre-Cert#:        Employer: Retired Benefits:  Phone #:       Name: Armed forces technical officer. Date: 07/09/93     Deduct: $1216      Out of Pocket Max: none      Life Max: unlimited CIR: 100%      SNF: 100 days Outpatient: 80%     Co-Pay: 20% Home Health: 100%      Co-Pay: none DME: 80%     Co-Pay: 20% Providers: patient's choice  SECONDARY: [place] of [place] PPO      Policy#: [id number]      Subscriber: Geannie Risen CM Name:        Phone#:       Fax#:   Pre-Cert#:        Employer: Retired Benefits:  Phone #: [phone]     Name:   Eff. Date:       Deduct:        Out of Pocket Max:        Life Max:   CIR:        SNF:   Outpatient:       Co-Pay:   Home Health:        Co-Pay:   DME:       Co-Pay:    Emergency Contact Information Contact Information   Name Relation Home Work Mobile   Kevil Spouse [phone]       Current Medical History  Patient Admitting Diagnosis: Deconditioned after C-Diff colitis  History of Present Illness: An 79 y.o. right-handed male with a history of hypertension, diabetes mellitus with peripheral neuropathy and coronary artery disease with pacemaker. Patient presented 05/12/2012 through the emergency department with progressive non-watery diarrhea that initially (per chart) started 5 days prior to admission with associated decreased appetite and generalized weakness with dull abdominal pain. Patient reports to me a 3 week hx of decline. Patient stated recently seen a GI specialist and they had ordered stool studies but he was not sure which ones. Noted  creatinine 2.21 and hypokalemic at 2.4. Clostridium difficile specimens 05/12/2012 positive and placed on Flagyl and vancomycin. Subcutaneous Lovenox added for DVT prophylaxis. Renal function continues to improve with gentle IV fluids latest creatinine 1.1 and 4 as well as potassium supplement for hypokalemia improved to 3.3. Physical therapy evaluation completed 05/13/2012 noted decrease in mobility as well as activity tolerance with recommendations of physical medicine rehabilitation consult to consider inpatient rehabilitation services.   Past Medical History  Past Medical History  Diagnosis Date  . Hypertension   . Muscle spasm   . Diabetes mellitus   . Enlarged prostate   . CAD (coronary artery disease)   . Pacemaker    Family History  family history is not on file.  Prior Rehab/Hospitalizations:  Was at [facility] SNF about a year ago.   Current Medications  Current facility-administered medications:aspirin EC tablet 81 mg, 81 mg, Oral, Daily, Dorothea Ogle, MD, 81 mg at 05/15/12 1610;  diazepam (VALIUM) tablet 10 mg, 10 mg, Oral, Q8H PRN, Dorothea Ogle, MD;  enoxaparin (LOVENOX) injection 40 mg, 40 mg, Subcutaneous, Q24H, Dennie Fetters, RPH, 40 mg at 05/14/12 1730;  escitalopram (LEXAPRO) tablet 10 mg, 10 mg, Oral, Daily, Dorothea Ogle, MD, 10 mg at 05/15/12 0927 fluticasone (FLONASE) 50 MCG/ACT nasal spray 2 spray, 2 spray, Each Nare, Daily, Dorothea Ogle, MD, 2 spray at 05/15/12 [phone];  HYDROcodone-acetaminophen (NORCO/VICODIN) 5-325 MG per tablet 1-2 tablet, 1-2 tablet, Oral, Q4H PRN, Dorothea Ogle, MD, 2 tablet at 05/14/12 2150;  multivitamin with minerals tablet 1 tablet, 1 tablet, Oral, Daily, Dorothea Ogle, MD, 1 tablet at 05/15/12 0927 ondansetron (ZOFRAN) injection 4 mg, 4 mg, Intravenous, Q6H PRN, Dorothea Ogle, MD;  ondansetron [facility]) tablet 4 mg, 4 mg, Oral, Q6H PRN, Dorothea Ogle, MD;  saccharomyces boulardii (FLORASTOR) capsule 250 mg, 250 mg, Oral, BID, Elease Etienne, MD, 250 mg at 05/15/12 5409;  simvastatin (ZOCOR) tablet 40 mg, 40 mg, Oral, QHS, Dorothea Ogle, MD, 40 mg at 05/14/12 2119 sodium chloride 0.9 % injection 3 mL, 3 mL, Intravenous, Q12H, Dorothea Ogle, MD, 3 mL at 05/15/12 8119;  terazosin (HYTRIN) capsule 5 mg, 5 mg, Oral, QHS, Dorothea Ogle, MD, 5 mg at 05/14/12 2119;  vancomycin (VANCOCIN) 50 mg/mL oral solution 125 mg, 125 mg, Oral, Q6H, Elease Etienne, MD, 125 mg at 05/15/12 1222;  venlafaxine XR (EFFEXOR-XR) 24 hr capsule 75 mg, 75 mg, Oral, Daily, Dorothea Ogle, MD, 75 mg at 05/15/12 1478  Patients Current Diet: General  Precautions / Restrictions Precautions Precautions: Fall;Other (comment) (c-diff contact) Precaution Comments: c- diff contact   Prior Activity Level Household: Homebound for the past 3 weeks.  Prior to that went out about 3 X a week.  Home Assistive Devices / Equipment Home Assistive Devices/Equipment: Bedside commode/3-in-1;Walker (specify type) Home Adaptive Equipment: Grab bars in shower;Hand-held shower hose;Quad cane;Straight cane;Built-in shower seat;Walker - rolling  Prior Functional Level Prior Function Level of Independence: Independent with assistive device(s);Needs assistance Needs Assistance: Meal Prep;Light Housekeeping Meal Prep: Total Light Housekeeping: Total Able to Take Stairs?: Yes Driving: Yes (short distances) Vocation: Retired Comments: Programmer, multimedia  Current Functional Level Cognition  Overall Cognitive Status: Appears within functional limits for tasks assessed/performed Orientation Level: Oriented X4 Cognition - Other Comments: not specifically tested, but seemed WNL for tasks assessed.      Extremity Assessment (includes Sensation/Coordination)     RLE ROM/Strength/Tone: Deficits RLE ROM/Strength/Tone Deficits: ankle 3/5, knee 3-/5, hip 3-/5 RLE Sensation: History of peripheral neuropathy    ADLs       Mobility  Rolling Right: 6: Modified independent  (Device/Increase time);With rail Right Sidelying to Sit: 3: Mod assist;With rails;HOB elevated Sitting - Scoot to Edge of Bed: 3: Mod assist;With rail Sit to Supine: 5: Supervision;With rail;HOB flat Scooting to HOB: 5: Supervision;With rail;Other (comment) (bed in maximal trendelenberg)    Transfers  Transfers: Sit to Stand;Stand to Sit Sit to Stand: 2: Max assist;With upper extremity assist;With armrests;From bed;Without upper extremity assist Stand to Sit: 2: Max assist;Not tested (comment);With armrests;To bed;To elevated surface    Ambulation / Gait / Stairs / Wheelchair Mobility  Ambulation/Gait Ambulation/Gait Assistance: Not tested (comment) (pt not ready )    Posture / Balance  Static Sitting Balance Static Sitting - Balance Support: Bilateral upper extremity supported;Feet supported Static Sitting - Level of Assistance: 5: Stand by assistance Static Sitting - Comment/# of Minutes: sat EOB x 10 mins for MMT, activity tolerance Static Standing Balance Static Standing - Balance Support: Bilateral upper extremity supported Static Standing - Level of Assistance: 3: Mod assist Static Standing - Comment/# of Minutes: mod assist to support trunk in standing, anteriorly wieght shfit pt over feet.  Holding therapist's hand on the left and bed rail on the right vs bedside table.  Did work on weight shifting and pre gait activities while standing x 2 < 5 mins.  Pt has decreased standing tolerance due to history of chronic low back pain.      Special needs/care consideration BiPAP/CPAP No CPM No Continuous Drip IV 0.45 NS with 20 meq KCL at 100cc/hr Dialysis No         Life Vest No Oxygen No Special Bed No Trach Size No Wound Vac (area) No       Skin No                               Bowel mgmt: Had BM today, 05/15/12 Bladder mgmt: Voiding on BSC, some incontinence at times Diabetic mgmt Yes, controlled with oral medication at home.   Previous Home Environment Living Arrangements:  Spouse/significant other Lives With: Spouse Available Help at Discharge: Family;Available 24 hours/day Type of Home: House Home Layout: One level Home Access: Stairs to enter Entrance Stairs-Rails: None Entrance Stairs-Number of Steps: 1 Bathroom Shower/Tub: Psychologist, counselling;Door [employer] Toilet: Standard Home Care Services: No  Discharge Living Setting Plans for Discharge Living Setting: Patient's home;House;Lives with (comment) Type of Home at Discharge: House Discharge Home Layout: One level;Laundry or work area in basement (Has a finished basement.) Discharge Home Access: Stairs to enter [employer] of Steps: 1 step at front and side entry. Do you have any problems obtaining your medications?: No  Social/Family/Support Systems Patient Roles: Spouse;Parent Contact Information: Loden Laurent - wife [phone] Anticipated Caregiver: wife and self Ability/Limitations of Caregiver: Wife is retired and can assist with supervision at home Caregiver Availability: 24/7 Discharge Plan Discussed with Primary Caregiver: Yes Is Caregiver In Agreement with Plan?: Yes Does Caregiver/Family have Issues with Lodging/Transportation while Pt is in Rehab?: No  Goals/Additional Needs Patient/Family Goal for Rehab: PT/OT mod I goals, no ST needs Expected length of stay: 7-12 days Cultural Considerations: Retired Arts administrator Dietary Needs: Regular diet with thin liquids Equipment Needs: TBD Pt/Family Agrees to Admission and willing to participate: Yes Program Orientation Provided & Reviewed with Pt/Caregiver Including Roles  & Responsibilities: Yes  Decrease burden of Care through IP rehab admission:  Not applicable  Possible need for SNF placement upon discharge: No, not likely  Patient Condition: This patient's condition remains as documented in the Consult dated 05/14/12, in which the Rehabilitation Physician determined and documented that the patient's condition is  appropriate for intensive rehabilitative care in an inpatient rehabilitation facility.  Preadmission Screen Completed By:  Trish Mage, 05/15/2012 12:32 PM ______________________________________________________________________   Discussed status with Dr. Riley Kill on 05/15/12 at 1030 and received telephone approval for admission today.  Admission Coordinator:  Trish Mage, time1245/Date03/07/14

## 2012-05-15 NOTE — Interval H&P Note (Signed)
Randall Odom was admitted today to Inpatient Rehabilitation with the diagnosis of severe deconditioning after prolonged c diff diarrhea.  The patient's history has been reviewed, patient examined, and there is no change in status.  Patient continues to be appropriate for intensive inpatient rehabilitation.  I have reviewed the patient's chart and labs.  Questions were answered to the patient's satisfaction.  SWARTZ,ZACHARY T 05/15/2012, 8:42 PM

## 2012-05-15 NOTE — Progress Notes (Signed)
Patient admitted to CIR at 1450. AOx4, denies pain. C. Diff precautions in place. Patient oriented to unit, rehab schedule, call bell system, and safety plan; verbalized understanding. Randall Odom

## 2012-05-15 NOTE — Progress Notes (Signed)
Patient is being discharged to Inpatient Rehab, report given to nurse Gracie on unit 4000.  D. Manson Passey RN

## 2012-05-15 NOTE — Progress Notes (Signed)
Rehab admissions - I spoke with patient and then talked to his wife by telephone.  Both are in agreement to inpatient rehab admission.  Bed available and will admit to inpatient rehab today.  Call me for questions.  #[phone]

## 2012-05-15 NOTE — Discharge Summary (Addendum)
Physician Discharge Summary  Randall Odom OZH:[id number] DOB: 10/18/1926 DOA: 05/12/2012  PCP: Hoyle Sauer, MD  Admit date: 05/12/2012 Discharge date: 05/15/2012  Time spent: Greater than 30 minutes  Recommendations for Outpatient Follow-up:  1. With Dr. Chilton Greathouse, PCP on discharge from Rehab. 2. Patient's Glipizide and Lisinopril have been held. Consider resuming on discharge from rehabilitation as deemed appropriate. 3. On discharge from Rehab, patient will need scripts for Florastor. [facility] pharmacy has enrolled patient in a C Diff study and will provide PO Vancomycin free of cost to patient.  Discharge Diagnoses:  Principal Problem:   C. difficile colitis Active Problems:   Diabetes mellitus   HTN (hypertension)   Diarrhea   Acute renal failure   Hypokalemia   Dehydration   Discharge Condition: Improved & Stable  Diet recommendation: Heart healthy and diabetic  Filed Weights   05/13/12 0609 05/14/12 0604 05/15/12 0648  Weight: 87.272 kg (192 lb 6.4 oz) 92.08 kg (203 lb) 92.5 kg (203 lb 14.8 oz)    History of present illness:  Pt is 79 year old male who presents to the emergency department with main concern of progressive non watery diarrhea that initially started 5 days prior to admission and has been associated with poor oral intake and generalized, dull abdominal pain, generalized weakness. He reports over 10 episodes per day. He explains that he was seen recently by GI specialist and they have ordered stool studies but he is not sure which ones. Pt denies fevers, chill, other viral symptoms, no other abdominal concerns, no urinary concerns, no specifc focal neurological weakness.  In ED, pt found to have mild leukocytosis, hypokalemia, acute renal failure with Cr 2.21. TRH asked to admit for further evaluation.  Hospital Course:  1. C. difficile colitis: Patient was empirically started on IV Flagyl initially. Due to his advanced age, elevated creatinine and  leukocytosis on admission, this was changed to oral vancomycin and probiotics were added. Patient has made significant improvement. He gives history of one more formed BM on 05/14/12 afternoon. Denies abdominal pain, nausea or vomiting. Tolerating oral diet well. Complete total 14 days of oral vancomycin. Avoid unnecessary antibiotics and PPIs. PPI was discontinued-patient denied history of GI bleed or GERD or peptic ulcer disease. Stool cultures are pending. Stools negative for O&P. 2. Hypokalemia: Secondary to diarrhea. Improved. Repleted prior to discharge. 3. Dehydration: Resolved. DC IV fluids.  4. Acute renal failure: Likely secondary to diarrhea, dehydration and lisinopril. Resolved after IV fluids. Continue to hold lisinopril for a few more days-consider resuming on discharge from rehabilitation. 5. Type II DM: Reasonable inpatient control without any hypoglycemic agents. Glipizide currently on hold-consider resuming on discharge from rehabilitation as deemed appropriate. 6. Hypertension: Currently controlled without antihypertensives.  7. Anemia, possibly dilutional: Stable  8. Moderate Malnutrition in the context of Acute Illness: should improve with resolution of acute illness and improved nutrition.  9. Deconditioning: due to acute illness. Rehab.      Procedures:  None   Consultations:  Rehabilitation MD  Discharge Exam:  Complaints: 1 more formed BM on 05/14/12. Denies abdominal pain, nausea or vomiting. Tolerating regular consistency diet. Feels stronger. Denies any other complaints.  Filed Vitals:   05/14/12 1728 05/14/12 2226 05/15/12 0648 05/15/12 0926  BP: 131/76 127/78 124/62 115/75  Pulse: 66 92 65 63  Temp:  97.7 F (36.5 C) 97.5 F (36.4 C)   TempSrc:  Oral Oral   Resp: 18 20 18 20   Height:      Weight:  92.5 kg (203 lb 14.8 oz)   SpO2: 98% 95% 95% 98%    General exam: Comfortable.  Respiratory system: Clear. No increased work of breathing.   Cardiovascular system: S1 & S2 heard, RRR. No JVD, murmurs, gallops, clicks and trace bilateral leg edema. Telemetry shows paced rhythm in the 60s  Gastrointestinal system: Abdomen is nondistended, soft and nontender. Normal bowel sounds heard.  Central nervous system: Alert and oriented. No focal neurological deficits.  Extremities: Symmetric 5 x 5 power.  Discharge Instructions      Discharge Orders   Future Orders Complete By Expires     Call MD for:  extreme fatigue  As directed     Call MD for:  persistant dizziness or light-headedness  As directed     Call MD for:  persistant nausea and vomiting  As directed     Call MD for:  temperature >100.4  As directed     Call MD for:  As directed     Comments:      Worsening diarrhea.    Diet - low sodium heart healthy  As directed     Diet Carb Modified  As directed     Increase activity slowly  As directed         Medication List    STOP taking these medications       ciprofloxacin 500 MG tablet  Commonly known as:  CIPRO     diphenoxylate-atropine 2.5-0.025 MG per tablet  Commonly known as:  LOMOTIL     esomeprazole 40 MG capsule  Commonly known as:  NEXIUM     glipiZIDE 5 MG tablet  Commonly known as:  GLUCOTROL     lisinopril 5 MG tablet  Commonly known as:  PRINIVIL,ZESTRIL     SAW PALMETTO PO     spironolactone 25 MG tablet  Commonly known as:  ALDACTONE      TAKE these medications       aspirin EC 81 MG tablet  Take 81 mg by mouth daily.     diazepam 10 MG tablet  Commonly known as:  VALIUM  Take 10 mg by mouth every 6 (six) hours as needed. anxiety     escitalopram 10 MG tablet  Commonly known as:  LEXAPRO  Take 10 mg by mouth daily.     fluticasone 50 MCG/ACT nasal spray  Commonly known as:  FLONASE  Place 2 sprays into the nose daily as needed for allergies.     saccharomyces boulardii 250 MG capsule  Commonly known as:  FLORASTOR  Take 1 capsule (250 mg total) by mouth 2 (two) times daily.      simvastatin 40 MG tablet  Commonly known as:  ZOCOR  Take 40 mg by mouth at bedtime.     terazosin 5 MG capsule  Commonly known as:  HYTRIN  Take 5 mg by mouth at bedtime.     vancomycin 50 mg/mL oral solution  Commonly known as:  VANCOCIN  Take 2.5 mLs (125 mg total) by mouth every 6 (six) hours. Discontinue after 05/27/2012 doses.     Vitamin D 2000 UNITS tablet  Take 2,000 Units by mouth daily.          The results of significant diagnostics from this hospitalization (including imaging, microbiology, ancillary and laboratory) are listed below for reference.    Significant Diagnostic Studies: Dg Chest 2 View  05/12/2012  *RADIOLOGY REPORT*  Clinical Data: Vomiting, weakness, diarrhea, history hypertension, diabetes, coronary artery disease  CHEST - 2 VIEW  Comparison: 05/06/2011  Findings: Right subclavian sequential transveous pacemaker leads project within right atrium and right ventricle. Upper-normal size of cardiac silhouette. Calcified tortuous aorta. Mediastinal contours and pulmonary vascularity otherwise normal. Emphysematous changes with bibasilar scarring. No acute infiltrate, pleural effusion or pneumothorax. No acute osseous findings.  IMPRESSION: Question COPD with bibasilar scarring. No acute abnormalities.   Original Report Authenticated By: Ulyses Southward, M.D.     Microbiology: Recent Results (from the past 240 hour(s))  OVA AND PARASITE EXAMINATION     Status: None   Collection Time    05/12/12  4:59 PM      Result Value Range Status   Specimen Description STOOL   Final   Special Requests NONE   Final   Ova and parasites NO OVA OR PARASITES SEEN MODERATE YEAST   Final   Report Status 05/13/2012 FINAL   Final  CLOSTRIDIUM DIFFICILE BY PCR     Status: Abnormal   Collection Time    05/12/12  4:59 PM      Result Value Range Status   C difficile by pcr POSITIVE (*) NEGATIVE Final   Comment: CRITICAL RESULT CALLED TO, READ BACK BY AND VERIFIED WITH:     K  WALL RN 2103 05/12/12 A BROWNING  STOOL CULTURE     Status: None   Collection Time    05/12/12  4:59 PM      Result Value Range Status   Specimen Description STOOL   Final   Special Requests NONE   Final   Culture     Final   Value: NO SUSPICIOUS COLONIES, CONTINUING TO HOLD     Note: REDUCED NORMAL FLORA PRESENT   Report Status PENDING   Incomplete     Labs: Basic Metabolic Panel:  Recent Labs Lab 05/12/12 1158 05/13/12 0525 05/13/12 1738 05/14/12 0455 05/15/12 0615  NA 140 140 143 144 143  K 2.8* 2.4* 3.3* 3.8 3.4*  CL 101 105 108 113* 109  CO2 24 22 23 25 26   GLUCOSE 56* 98 125* 106* 128*  BUN 53* 41* 32* 24* 16  CREATININE 2.10* 1.36* 1.14 1.08 0.87  CALCIUM 9.8 9.5 9.3 8.9 8.8  MG 2.0  --   --   --   --    Liver Function Tests:  Recent Labs Lab 05/12/12 1158  AST 21  ALT 17  ALKPHOS 86  BILITOT 0.6  PROT 6.6  ALBUMIN 3.2*   No results found for this basename: LIPASE, AMYLASE,  in the last 168 hours No results found for this basename: AMMONIA,  in the last 168 hours CBC:  Recent Labs Lab 05/12/12 1158 05/13/12 0525 05/14/12 0455 05/15/12 0615  WBC 15.0* 12.1* 10.2 9.6  NEUTROABS 10.0*  --   --   --   HGB 13.7 12.3* 11.5* 11.8*  HCT 37.6* 35.1* 32.3* 34.5*  MCV 88.9 88.2 89.5 92.5  PLT 228 216 207 226   Cardiac Enzymes: No results found for this basename: CKTOTAL, CKMB, CKMBINDEX, TROPONINI,  in the last 168 hours BNP: BNP (last 3 results) No results found for this basename: PROBNP,  in the last 8760 hours CBG:  Recent Labs Lab 05/14/12 1123 05/14/12 1613 05/14/12 2222 05/15/12 0645 05/15/12 1121  GLUCAP 104* 121* 121* 108* 135*    Additional labs:    Signed:  Aubri Gathright  [facility] 05/15/2012, 1:23 PM

## 2012-05-15 NOTE — Plan of Care (Signed)
Overall Plan of Care [facility]) Patient Details Name: EVEREST BROD MRN: [id number] DOB: Feb 02, 1927  Diagnosis:  Deconditioning after severe case of C diff  Co-morbidities: Diarrhea, hypokalemia, depression  Functional Problem List  Patient demonstrates impairments in the following areas: Medication Management, Nutrition, Safety and Skin Integrity  Basic ADL's: grooming, bathing, dressing and toileting Advanced ADL's: N/A  Transfers:  bed mobility, bed to chair, toilet, tub/shower and car Locomotion:  ambulation  Additional Impairments:  Leisure Awareness  Anticipated Outcomes Item Anticipated Outcome  Eating/Swallowing    Basic self-care  Mod I  Tolieting  Mod I  Bowel/Bladder  Continent bowel/bladder mod independent  Transfers  Mod-I  Locomotion  37' using RW with Mod-I assist in home and community  Communication    Cognition    Pain  Any pain managed 2 or less with prn medications  Safety/Judgment    Other  Skin: no new breakdown while on rehab, min assist with skin breakdown prevention interventions.    Therapy Plan: PT Intensity: Minimum of 1-2 x/day ,45 to 90 minutes PT Frequency: 5 out of 7 days PT Duration Estimated Length of Stay: 10-14 days OT Intensity: Minimum of 1-2 x/day, 45 to 90 minutes OT Frequency: 5 out of 7 days OT Duration/Estimated Length of Stay: 10-14 days      Team Interventions: Item RN PT OT SLP SW TR Other  Self Care/Advanced ADL Retraining   x      Neuromuscular Re-Education         Therapeutic Activities  x x      UE/LE Strength Training/ROM  x x      UE/LE Coordination Activities   x      Visual/Perceptual Remediation/Compensation         DME/Adaptive Equipment Instruction  x x      Therapeutic Exercise  x x      Balance/Vestibular Training  x x      Patient/Family Education x x x      Cognitive Remediation/Compensation         Functional Mobility Training  x x      Ambulation/Gait Training  x       Adult nurse Reintegration   x      Dysphagia/Aspiration Film/video editor         Bladder Management x        Bowel Management x        Disease Management/Prevention x        Pain Management x  x      Medication Management x        Skin Care/Wound Management x        Splinting/Orthotics         Discharge Planning   x      Psychosocial Support   x                             Team Discharge Planning: Destination: PT-Home ,OT- Home , SLP-  Projected Follow-up: PT-None, OT-  None, SLP-  Projected Equipment Needs: PT- , OT-  , SLP-  Patient/family involved in discharge planning: PT- Patient,  OT-Patient, SLP-   MD ELOS: 10-12 days Medical Rehab Prognosis:  Excellent Assessment: The  patient has been admitted for CIR therapies. The team will be addressing, functional mobility, strength, stamina, balance, safety, adaptive techniques/equipment, self-care, bowel and bladder mgt, patient and caregiver education, skin care mgt. Goals have been set at mod I for mobility and self-care. Pt is quite motivated..      See Team Conference Notes for weekly updates to the plan of care

## 2012-05-16 ENCOUNTER — Inpatient Hospital Stay (HOSPITAL_COMMUNITY): Payer: Medicare Other | Admitting: Occupational Therapy

## 2012-05-16 ENCOUNTER — Inpatient Hospital Stay (HOSPITAL_COMMUNITY): Payer: Medicare Other | Admitting: Physical Therapy

## 2012-05-16 DIAGNOSIS — A0472 Enterocolitis due to Clostridium difficile, not specified as recurrent: Secondary | ICD-10-CM

## 2012-05-16 DIAGNOSIS — R5381 Other malaise: Secondary | ICD-10-CM

## 2012-05-16 DIAGNOSIS — E876 Hypokalemia: Secondary | ICD-10-CM

## 2012-05-16 LAB — STOOL CULTURE

## 2012-05-16 LAB — GLUCOSE, CAPILLARY

## 2012-05-16 NOTE — Progress Notes (Signed)
Physical Therapy Session Note  Patient Details  Name: Randall Odom MRN: [id number] Date of Birth: Feb 02, 1927  Today's Date: 05/16/2012 Time: 1191-4782 Time Calculation (min): 45 min  Skilled Therapeutic Interventions/Progress Updates:  Ambulation/gait training;Balance/vestibular training;DME/adaptive equipment instruction;Functional mobility training;Pain management;Patient/family education;Therapeutic Activities;Therapeutic Exercise;UE/LE Strength taining/ROM   Therapy Documentation Precautions:  Precautions: Fall;Other (comment) Precaution Comments: c- diff contact Restrictions Weight Bearing Restrictions: No Pain: Pain Assessment: 0-10 Pain Score:   5 Pain Type: Chronic pain Pain Location: Back Pain Orientation: Lower Pain Descriptors: Aching  Therapeutic Exercise:(15') B LE's in supine Therapeutic Activity:(15') Transfer training sit<->stand into RW with min-A, verbal cues for safety Gait Training:(15') using RW 2 x 60' with S/min-A and verbal cues for posture and positioning in RW  See FIM for current functional status  Therapy/Group: Individual Therapy  LUNDGREN, DANIEL J 05/16/2012, 1:05 PM

## 2012-05-16 NOTE — Progress Notes (Signed)
Patient ID: Randall Odom, male   DOB: 1927-02-14, 79 y.o.   MRN: [id number] Subjective/Complaints: Stools becoming more firm Review of Systems  Gastrointestinal: Negative for nausea, vomiting, abdominal pain and constipation.  All other systems reviewed and are negative.   Objective: Vital Signs: Blood pressure 127/68, pulse 65, temperature 97.8 F (36.6 C), temperature source Oral, resp. rate 20, height 5\' 10"  (1.778 m), weight 94.076 kg (207 lb 6.4 oz), SpO2 94.00%. No results found. Results for orders placed during the hospital encounter of 05/15/12 (from the past 72 hour(s))  CBC     Status: Abnormal   Collection Time    05/15/12  3:40 PM      Result Value Range   WBC 9.1  4.0 - 10.5 K/uL   RBC 3.75 (*) 4.22 - 5.81 MIL/uL   Hemoglobin 12.0 (*) 13.0 - 17.0 g/dL   HCT 40.9 (*) 81.1 - 91.4 %   MCV 93.1  78.0 - 100.0 fL   MCH 32.0  26.0 - 34.0 pg   MCHC 34.4  30.0 - 36.0 g/dL   RDW 78.2  95.6 - 21.3 %   Platelets 218  150 - 400 K/uL  CREATININE, SERUM     Status: Abnormal   Collection Time    05/15/12  3:40 PM      Result Value Range   Creatinine, Ser 0.82  0.50 - 1.35 mg/dL   GFR calc non Af Amer 80 (*) >90 mL/min   GFR calc Af Amer >90  >90 mL/min   Comment:            The eGFR has been calculated     using the CKD EPI equation.     This calculation has not been     validated in all clinical     situations.     eGFR's persistently     <90 mL/min signify     possible Chronic Kidney Disease.  GLUCOSE, CAPILLARY     Status: None   Collection Time    05/16/12  5:46 AM      Result Value Range   Glucose-Capillary 94  70 - 99 mg/dL   Comment 1 Notify RN       HEENT: normal Cardio: RRR Resp: CTA B/L GI: BS positive and Distention Extremity:  No Edema Skin:   Intact Neuro: Alert/Oriented, Normal Sensory, Abnormal Motor 4/5 R Bi, tri , grip otherwise 5/5 and Normal FMC Musc/Skel:  Normal Gen NAD   Assessment/Plan: 1. Functional deficits secondary to  deconditioning after severe bout of C Diff which require 3+ hours per day of interdisciplinary therapy in a comprehensive inpatient rehab setting. Physiatrist is providing close team supervision and 24 hour management of active medical problems listed below. Physiatrist and rehab team continue to assess barriers to discharge/monitor patient progress toward functional and medical goals. FIM:                   Comprehension Comprehension Mode: Auditory Comprehension: 7-Follows complex conversation/direction: With no assist  Expression Expression Mode: Verbal Expression: 7-Expresses complex ideas: With no assist  Social Interaction Social Interaction: 6-Interacts appropriately with others with medication or extra time (anti-anxiety, antidepressant).  Problem Solving Problem Solving: 6-Solves complex problems: With extra time  Memory Memory: 6-More than reasonable amt of time  Medical Problem List and Plan:  1. Deconditioning after prolonged Clostridium difficile. Continue by mouth vancomycin as directed/ florastor  2. DVT Prophylaxis/Anticoagulation: Subcutaneous Lovenox. Monitor platelet counts and any signs of bleeding  3. Pain Management: Hydrocodone as needed. Monitor with increased mobility  4. Mood/depression. Lexapro 10 mg daily, Valium 10 mg every 8 hours as needed anxiety and Effexor 75 mg daily. Provide emotional support and positive reinforcement  5. Neuropsych: This patient is capable of making decisions on his/her own behalf.  6. Hypokalemia. Followup chemistries  7. BPH. Hytrin. Check PVRs x3  8. Decreased nutritional storage. Followup with dietary services  9. Hyperlipidemia. Zocor  LOS (Days) 1 A FACE TO FACE EVALUATION WAS PERFORMED  Sherise Geerdes E 05/16/2012, 8:30 AM

## 2012-05-16 NOTE — Evaluation (Signed)
Physical Therapy Assessment and Plan  Patient Details  Name: CLARE CASTO MRN: [id number] Date of Birth: 1927-01-19  PT Diagnosis: Difficulty walking and Muscle weakness Rehab Potential: Good ELOS: 10-14 days   Today's Date: 05/16/2012 Time: 0730-0830 Time Calculation (min): 60 min  Problem List:  Patient Active Problem List  Diagnosis  . Syncope and collapse  . Diabetes mellitus  . Pacemaker  . HTN (hypertension)  . CAD (coronary artery disease)  . Diarrhea  . Acute renal failure  . Hypokalemia  . C. difficile colitis  . Dehydration    Past Medical History:  Past Medical History  Diagnosis Date  . Hypertension   . Muscle spasm   . Diabetes mellitus   . Enlarged prostate   . CAD (coronary artery disease)   . Pacemaker    Past Surgical History:  Past Surgical History  Procedure Laterality Date  . Pacemaker insertion      Assessment & Plan Clinical Impression:Amaro R Miyoshi is a 79 y.o. right-handed male with a history of hypertension, diabetes mellitus with peripheral neuropathy and coronary artery disease with pacemaker. Patient independent active until approximately 3 weeks ago. Patient presented 05/12/2012 through the emergency department with progressive non-watery diarrhea that initially (per chart) started 5 days prior to admission with associated decreased appetite and generalized weakness with dull abdominal pain. Patient reports to me a 3 week hx of decline. Patient stated recently seen a GI specialist and they had ordered stool studies but he was not sure which ones. Noted creatinine 2.21 and hypokalemic at 2.4. Clostridium difficile specimens 05/12/2012 positive and placed on Flagyl until 05/13/2012 and and change to by mouth vancomycin. Subcutaneous Lovenox added for DVT prophylaxis. Renal function continues to improve with gentle IV fluids latest creatinine 1.1 and 4 as well as potassium supplement for hypokalemia improved to 3.3. Physical therapy evaluation  completed 05/13/2012 noted decrease in mobility as well as activity tolerance with recommendations of physical medicine rehabilitation consult to consider inpatient rehabilitation services.   Patient transferred to CIR on 05/15/2012 .   Patient currently requires min with mobility secondary to muscle weakness.  Prior to hospitalization, patient was modified independent  with mobility and lived with Spouse in a House home.  Home access is 1Stairs to enter.  Patient will benefit from skilled PT intervention to maximize safe functional mobility, minimize fall risk and decrease caregiver burden for planned discharge home with 24 hour supervision.  Anticipate patient will not need PT follow up at discharge.  PT - End of Session Activity Tolerance: Tolerates 30+ min activity with multiple rests Endurance Deficit: Yes Endurance Deficit Description: fatigues quickly PT Assessment Rehab Potential: Good Barriers to Discharge: None PT Plan PT Intensity: Minimum of 1-2 x/day ,45 to 90 minutes PT Frequency: 5 out of 7 days PT Duration Estimated Length of Stay: 10-14 days PT Treatment/Interventions: Ambulation/gait training;Balance/vestibular training;DME/adaptive equipment instruction;Functional mobility training;Pain management;Patient/family education;Therapeutic Activities;Therapeutic Exercise;UE/LE Strength taining/ROM PT Recommendation Follow Up Recommendations: None Patient destination: Home  Skilled Therapeutic Intervention Patient supine to sit in hospital bed using HOB flat and bed rails with supervision. Patient min assist with sit to stand. Once standing, patient transfers with rolling walker and min assist. Patient ambulated 20 feet with rolling walker. Patient requires cueing to stand upright in walker and to bring walker with him when turning. Patient takes extra time with ambulation. Patient also reports pain in lower back with movement. Patient transferred wheelchair to regular double bed -  min assist to stand and min  steady assist for pivot once standing. Patient sit to supine with supervision. Patient rolled to left and to right with verbal cueing/supervision. Patient sit to stand from bed (26") and transferred back to wheelchair with min steady assist.  Patient left up in w/c with lap belt on and with all items including call bell within reach.  PT Evaluation Precautions/Restrictions Precautions Precautions: Fall;Other (comment) (c-diff, contact) Restrictions Weight Bearing Restrictions: No General Chart Reviewed: Yes Family/Caregiver Present: No   Vital Signs Therapy Vitals Temp: 97.8 F (36.6 C) Temp src: Oral Pulse Rate: 65 Resp: 20 BP: 127/68 mmHg Oxygen Therapy SpO2: 94 % O2 Device: None (Room air) Pain Pain Assessment Pain Assessment: 0-10 Pain Score:   7 (currently 2/10) Pain Type: Chronic pain Pain Location: Back Pain Orientation: Lower Pain Descriptors: Aching;Sharp Pain Onset: On-going Patients Stated Pain Goal: 4 Pain Intervention(s): Medication (See eMAR);Repositioned Multiple Pain Sites: No Home Living/Prior Functioning Home Living Lives With: Spouse Available Help at Discharge: Family;Available 24 hours/day Type of Home: House Home Access: Stairs to enter [employer] of Steps: 1 Entrance Stairs-Rails: None Home Layout: Two level;Full bath on main level;Able to live on main level with bedroom/bathroom Alternate Level Stairs-Number of Steps: 8 (doesn't ever go to lower level) Alternate Level Stairs-Rails: Can reach both;Right;Left Bathroom Shower/Tub: Health visitor: Standard Bathroom Accessibility: Yes How Accessible: Accessible via walker Home Adaptive Equipment:  (mechanically adjustable bed and never puts HOB flat) Additional Comments: 4 falls at home in the past 6 months Prior Function Level of Independence: Requires assistive device for independence;Needs assistance with ADLs;Needs assistance with  homemaking;Independent with basic ADLs Driving: Yes (last time he drove is ~ 3 weeks prior to admission) Vocation: Retired Comments: retired Architect - History Baseline Vision: Wears glasses all the time Vision - Assessment Eye Alignment: Within Functional Limits  Cognition Overall Cognitive Status: Appears within functional limits for tasks assessed Orientation Level: Oriented X4 Sensation Sensation Light Touch: Appears Intact (mildly diminished sensation at toes B) Coordination Heel Shin Test: wfl Motor  Motor Motor: Within Functional Limits  Mobility Bed Mobility Bed Mobility: Rolling Right;Rolling Left;Supine to Sit;Sitting - Scoot to [place] of Bed;Scooting to [facility] Rolling Right: 6: Modified independent (Device/Increase time);With rail Rolling Left: 6: Modified independent (Device/Increase time) Supine to Sit: 6: Modified independent (Device/Increase time) Sitting - Scoot to Edge of Bed: 4: Min assist Sit to Supine: 5: Supervision;With rail;HOB flat Scooting to HOB: 5: Supervision;With rail Transfers Sit to Stand: 4: Min assist;With upper extremity assist;With armrests;From bed Stand to Sit: 4: Min guard;With armrests;To bed;To toilet Locomotion  Ambulation Ambulation: Yes Ambulation/Gait Assistance: 4: Min assist Ambulation Distance (Feet): 20 Feet Assistive device: Rolling walker Ambulation/Gait Assistance Details: Tactile cues for posture;Tactile cues for placement;Verbal cues for safe use of DME/AE Gait Gait: Yes Gait Pattern: Step-through pattern;Decreased step length - right;Decreased step length - left;Trunk flexed Stairs / Additional Locomotion Stairs: No (only has one step to enter home and main living area) Naval architect Mobility: No  Trunk/Postural Assessment  WFL except for standing with flexed trunk in standing  Balance min-A for dynamic standing balance using RW as he tends to lean backward. Extremity  Assessment  RUE Assessment RUE Assessment: Exceptions to WFL (ROM WFL, strength grossly 4-/5) LUE Assessment LUE Assessment:  (ROM WFL, strength grossly 4-/5) RLE Assessment RLE Assessment:  (R LE MMT grossly 4-/5) LLE Assessment LLE Assessment: Exceptions to WFL (L LE MMT grossly 4-/5)  FIM:  FIM - Banker  Devices: Therapist, occupational: 5: Supine > Sit: Supervision (verbal cues/safety issues);5: Sit > Supine: Supervision (verbal cues/safety issues);4: Bed > Chair or W/C: Min A (steadying Pt. > 75%);4: Chair or W/C > Bed: Min A (steadying Pt. > 75%) FIM - Locomotion: Wheelchair Locomotion: Wheelchair: 0: Activity did not occur FIM - Locomotion: Ambulation Locomotion: Ambulation Assistive Devices: Designer, industrial/product Ambulation/Gait Assistance: 4: Min assist Locomotion: Ambulation: 1: Travels less than 50 ft with minimal assistance (Pt.>75%) FIM - Locomotion: Stairs Locomotion: Stairs: 0: Activity did not occur (patient has one step/landing entry to home)   Refer to Care Plan for Long Term Goals  Recommendations for other services: None  Discharge Criteria: Patient will be discharged from PT if patient refuses treatment 3 consecutive times without medical reason, if treatment goals not met, if there is a change in medical status, if patient makes no progress towards goals or if patient is discharged from hospital.  The above assessment, treatment plan, treatment alternatives and goals were discussed and mutually agreed upon: by patient  Rex Kras 05/16/2012, 12:31 PM

## 2012-05-16 NOTE — Progress Notes (Signed)
Physical Therapy Session Note  Patient Details  Name: Randall Odom MRN: [id number] Date of Birth: 1926/11/24  Today's Date: 05/16/2012 Time: 1430-1500 Time Calculation (min): 30 min  Therapy Documentation Precautions:  Precautions Precautions: Fall;Other (comment) Precaution Comments: c- diff contact Restrictions Weight Bearing Restrictions: No Pain: Pain Assessment Pain Assessment: No/denies pain  Therapeutic Exercise:(15') supine exercises for B LE including bridging and manual stretching of B gastrocs Gait Training:(15')  Using RW 1 x 80' with S/min-A with verbal cues for posture and positioning inside RW  See FIM for current functional status  Therapy/Group: Individual Therapy  Rex Kras 05/16/2012, 5:42 PM

## 2012-05-16 NOTE — Evaluation (Signed)
Occupational Therapy Assessment and Plan  Patient Details  Name: Randall Odom MRN: [id number] Date of Birth: 1926/10/28  OT Diagnosis: lumbago (low back pain) and muscle weakness (generalized) Rehab Potential: Rehab Potential: Good ELOS: 10-14 days   Today's Date: 05/16/2012 Time: 1000-1100 Time Calculation (min): 60 min  Problem List:  Patient Active Problem List  Diagnosis  . Syncope and collapse  . Diabetes mellitus  . Pacemaker  . HTN (hypertension)  . CAD (coronary artery disease)  . Diarrhea  . Acute renal failure  . Hypokalemia  . C. difficile colitis  . Dehydration    Past Medical History:  Past Medical History  Diagnosis Date  . Hypertension   . Muscle spasm   . Diabetes mellitus   . Enlarged prostate   . CAD (coronary artery disease)   . Pacemaker    Past Surgical History:  Past Surgical History  Procedure Laterality Date  . Pacemaker insertion      Assessment & Plan Clinical Impression: Patient is a 79 y.o. left handed male with history of hypertension, diabetes mellitus with peripheral neuropathy and coronary artery disease with pacemaker. Patient independent active until approximately 3 weeks ago. Patient presented 05/12/2012 through the emergency department with progressive non-watery diarrhea that initially (per chart) started 5 days prior to admission with associated decreased appetite and generalized weakness with dull abdominal pain. Patient reports to me a 3 week hx of decline. Patient stated recently seen a GI specialist and they had ordered stool studies but he was not sure which ones. Noted creatinine 2.21 and hypokalemic at 2.4. Clostridium difficile specimens 05/12/2012 positive and placed on Flagyl until 05/13/2012 and and change to by mouth vancomycin. Subcutaneous Lovenox added for DVT prophylaxis. Renal function continues to improve with gentle IV fluids latest creatinine 1.1 and 4 as well as potassium supplement for hypokalemia improved to  3.3. Physical therapy evaluation completed 05/13/2012 noted decrease in mobility as well as activity tolerance with recommendations of physical medicine rehabilitation consult to consider inpatient rehabilitation services. Patient was felt to be a candidate for inpatient rehabilitation services and was admitted for comprehensive rehabilitation program  Patient transferred to CIR on 05/15/2012 .    Patient currently requires mod with basic self-care skills secondary to muscle weakness and decreased standing balance.  Prior to hospitalization, patient could complete ADLs with occasional min assist (to tie shoes) but mostly independent prior to decline.  Patient will benefit from skilled intervention to increase independence with basic self-care skills prior to discharge home with care partner.  Anticipate patient will require intermittent supervision and no further OT follow recommended.  OT - End of Session Activity Tolerance: Tolerates 30+ min activity with multiple rests OT Assessment Rehab Potential: Good Barriers to Discharge: None OT Plan OT Intensity: Minimum of 1-2 x/day, 45 to 90 minutes OT Frequency: 5 out of 7 days OT Duration/Estimated Length of Stay: 10-14 days OT Treatment/Interventions: Metallurgist training;Community reintegration;Discharge planning;Disease mangement/prevention;DME/adaptive equipment instruction;Functional mobility training;Pain management;Patient/family education;Psychosocial support;Self Care/advanced ADL retraining;Therapeutic Activities;Therapeutic Exercise;UE/LE Strength taining/ROM;UE/LE Coordination activities OT Recommendation Patient destination: Home Follow Up Recommendations: None Equipment Details: TBD by OT    Skilled Therapeutic Intervention Pt seen for OT evaluation followed by ADL assessment at walk-in shower level. Pt required mod assist with initial shower transfer, however min assist with sit to stand for LB dressing.  Pt reports BUE  weakness and quick to fatigue.  Educated pt on energy conservation strategies with self-care tasks.  Pt reports wanting to get the most out of  his time on rehab.  Stand pivot transfer from w/c to recliner min assist.  OT Evaluation Precautions/Restrictions  Precautions Precautions: Fall;Other (comment) Precaution Comments: c- diff contact Restrictions Weight Bearing Restrictions: No Pain Pain Assessment Pain Assessment: 0-10 Pain Score:   5 Pain Type: Chronic pain Pain Location: Back Pain Orientation: Lower Pain Descriptors: Aching Pain Onset: On-going Patients Stated Pain Goal: 4 Pain Intervention(s): RN made aware;Repositioned Multiple Pain Sites: No Home Living/Prior Functioning Home Living Lives With: Spouse Available Help at Discharge: Family;Available 24 hours/day Type of Home: House Home Access: Stairs to enter [employer] of Steps: 1 Entrance Stairs-Rails: None Home Layout: Two level;Full bath on main level;Able to live on main level with bedroom/bathroom Alternate Level Stairs-Number of Steps: 8 Alternate Level Stairs-Rails: Can reach both;Right;Left Bathroom Shower/Tub: Walk-in shower;Door [employer] Toilet: Standard Bathroom Accessibility: Yes How Accessible: Accessible via walker Home Adaptive Equipment:  (mechanically adjustable bed and never puts HOB flat) Additional Comments: 4 falls at home in the past 6 months Prior Function Level of Independence: Requires assistive device for independence;Needs assistance with ADLs;Needs assistance with homemaking;Independent with basic ADLs Dressing: Minimal Able to Take Stairs?: Yes Driving: Yes (hasn't driven in 3-4 weeks, used to drive short distances) Vocation: Retired Comments: retired Programmer, multimedia ADL ADL Grooming: Setup Where Assessed-Grooming: Sitting at sink Upper Body Bathing: Setup Where Assessed-Upper Body Bathing: Shower Lower Body Bathing: Maximal assistance Where Assessed-Lower Body Bathing:  Shower Upper Body Dressing: Setup Where Assessed-Upper Body Dressing: Wheelchair;Sitting at sink Lower Body Dressing: Moderate assistance Where Assessed-Lower Body Dressing: Sitting at sink;Standing at sink Toilet Transfer: Moderate assistance Toilet Transfer Method: Stand pivot Toilet Transfer Equipment: Grab bars;Raised Copy: Moderate assistance Film/video editor Method: Warden/ranger: Transfer tub bench;Grab bars Vision/Perception  Vision - History Baseline Vision: Wears glasses all the time Patient Visual Report: No change from baseline Vision - Assessment Eye Alignment: Within Functional Limits Vision Assessment: Vision tested Ocular Range of Motion: Within Functional Limits Perception Perception: Within Functional Limits Praxis Praxis: Intact  Cognition Overall Cognitive Status: Appears within functional limits for tasks assessed Orientation Level: Oriented X4 Attention: Selective Memory: Appears intact Awareness: Appears intact Problem Solving: Appears intact Safety/Judgment: Appears intact Sensation Sensation Light Touch: Appears Intact Hot/Cold: Appears Intact Proprioception: Appears Intact Coordination Gross Motor Movements are Fluid and Coordinated: No Fine Motor Movements are Fluid and Coordinated: Yes Finger Nose Finger Test: wfl Heel Shin Test: wfl Motor  Motor Motor: Within Functional Limits Mobility  Bed Mobility Bed Mobility: Rolling Right;Rolling Left;Supine to Sit;Sitting - Scoot to [employer] of Bed;Scooting to [facility] Rolling Right: 6: Modified independent (Device/Increase time);With rail Rolling Left: 6: Modified independent (Device/Increase time) Supine to Sit: 6: Modified independent (Device/Increase time) Sitting - Scoot to Edge of Bed: 4: Min assist Sit to Supine: 5: Supervision;With rail;HOB flat Scooting to HOB: 5: Supervision;With rail Transfers Sit to Stand: 4: Min assist;With upper  extremity assist;With armrests;From bed Stand to Sit: 4: Min guard;With armrests;To bed;To toilet  Extremity/Trunk Assessment RUE Assessment RUE Assessment: Exceptions to WFL (ROM WFL, strength grossly 4-/5) LUE Assessment LUE Assessment:  (ROM WFL, strength grossly 4-/5)  FIM:  FIM - Eating Eating Activity: 7: Complete independence:no helper FIM - Grooming Grooming Steps: Wash, rinse, dry face;Wash, rinse, dry hands;Oral care, brush teeth, clean dentures;Brush, comb hair Grooming: 5: Set-up assist to obtain items FIM - Bathing Bathing Steps Patient Completed: Chest;Right Arm;Left Arm;Abdomen;Right upper leg;Left upper leg Bathing: 3: Mod-Patient completes 5-7 49f 10 parts or 50-74% FIM -  Upper Body Dressing/Undressing Upper body dressing/undressing steps patient completed: Thread/unthread right sleeve of pullover shirt/dresss;Thread/unthread left sleeve of pullover shirt/dress;Put head through opening of pull over shirt/dress;Pull shirt over trunk Upper body dressing/undressing: 5: Set-up assist to: Obtain clothing/put away FIM - Lower Body Dressing/Undressing Lower body dressing/undressing steps patient completed: Thread/unthread right underwear leg;Thread/unthread left underwear leg;Pull underwear up/down;Thread/unthread right pants leg;Thread/unthread left pants leg;Pull pants up/down Lower body dressing/undressing: 3: Mod-Patient completed 50-74% of tasks FIM - Toileting Toileting: 0: Activity did not occur FIM - Banker Devices: Therapist, occupational: 5: Supine > Sit: Supervision (verbal cues/safety issues);5: Sit > Supine: Supervision (verbal cues/safety issues);4: Bed > Chair or W/C: Min A (steadying Pt. > 75%);4: Chair or W/C > Bed: Min A (steadying Pt. > 75%) FIM - Archivist Transfers: 0-Activity did not occur FIM - Secretary/administrator Devices: Tub transfer bench;Grab bars;Walk in  shower Tub/shower Transfers: 3-Into Tub/Shower: Mod A (lift or lower/lift 2 legs);4-Out of Tub/Shower: Min A (steadying Pt. > 75%/lift 1 leg)   Refer to Care Plan for Long Term Goals  Recommendations for other services: None  Discharge Criteria: Patient will be discharged from OT if patient refuses treatment 3 consecutive times without medical reason, if treatment goals not met, if there is a change in medical status, if patient makes no progress towards goals or if patient is discharged from hospital.  The above assessment, treatment plan, treatment alternatives and goals were discussed and mutually agreed upon: by patient  Leonette Monarch 05/16/2012, 3:29 PM

## 2012-05-17 ENCOUNTER — Inpatient Hospital Stay (HOSPITAL_COMMUNITY): Payer: Medicare Other | Admitting: *Deleted

## 2012-05-17 NOTE — Progress Notes (Signed)
Occupational Therapy Note  Patient Details  Name: Randall Odom MRN: [id number] Date of Birth: [birth year] Today's Date: 05/17/2012 [id number]  (60 mins) Pain:  None Individual session  Addressed bed mobility, transfers, functional mobility during ADL.  Pt. Was supervision with bed mobility and ambulated to bathroom.  Managed clothes, and pericare (had BM and urine).  BM was loose.   Ambulated to sink to perform bathing and dressing.  Pt ambulated down hall with minimal assis and RW for about 250 feet before resting.  Left pt in room with safety belt applied and call bell in reach.  Humberto Seals 05/17/2012, 9:17 AM

## 2012-05-17 NOTE — Progress Notes (Signed)
Patient ID: Randall Odom, male   DOB: 1926/08/27, 79 y.o.   MRN: [id number] Subjective/Complaints: Stools becoming more firm, occ chronic leg pain "comes from my back"  Doesn't keep him up at night Review of Systems  Gastrointestinal: Negative for nausea, vomiting, abdominal pain and constipation.  All other systems reviewed and are negative.   Objective: Vital Signs: Blood pressure 129/76, pulse 65, temperature 98.5 F (36.9 C), temperature source Oral, resp. rate 19, height 5\' 10"  (1.778 m), weight 94.076 kg (207 lb 6.4 oz), SpO2 97.00%. No results found. Results for orders placed during the hospital encounter of 05/15/12 (from the past 72 hour(s))  CBC     Status: Abnormal   Collection Time    05/15/12  3:40 PM      Result Value Range   WBC 9.1  4.0 - 10.5 K/uL   RBC 3.75 (*) 4.22 - 5.81 MIL/uL   Hemoglobin 12.0 (*) 13.0 - 17.0 g/dL   HCT 40.9 (*) 81.1 - 91.4 %   MCV 93.1  78.0 - 100.0 fL   MCH 32.0  26.0 - 34.0 pg   MCHC 34.4  30.0 - 36.0 g/dL   RDW 78.2  95.6 - 21.3 %   Platelets 218  150 - 400 K/uL  CREATININE, SERUM     Status: Abnormal   Collection Time    05/15/12  3:40 PM      Result Value Range   Creatinine, Ser 0.82  0.50 - 1.35 mg/dL   GFR calc non Af Amer 80 (*) >90 mL/min   GFR calc Af Amer >90  >90 mL/min   Comment:            The eGFR has been calculated     using the CKD EPI equation.     This calculation has not been     validated in all clinical     situations.     eGFR's persistently     <90 mL/min signify     possible Chronic Kidney Disease.  GLUCOSE, CAPILLARY     Status: None   Collection Time    05/16/12  5:46 AM      Result Value Range   Glucose-Capillary 94  70 - 99 mg/dL   Comment 1 Notify RN       HEENT: normal Cardio: RRR Resp: CTA B/L GI: BS positive and Distention Extremity:  No Edema Skin:   Intact Neuro: Alert/Oriented, Normal Sensory, Abnormal Motor 4/5 R Bi, tri , grip otherwise 5/5 and Normal FMC Musc/Skel:  Normal Gen  NAD   Assessment/Plan: 1. Functional deficits secondary to deconditioning after severe bout of C Diff which require 3+ hours per day of interdisciplinary therapy in a comprehensive inpatient rehab setting. Physiatrist is providing close team supervision and 24 hour management of active medical problems listed below. Physiatrist and rehab team continue to assess barriers to discharge/monitor patient progress toward functional and medical goals. FIM: FIM - Bathing Bathing Steps Patient Completed: Chest;Right Arm;Left Arm;Abdomen;Right upper leg;Left upper leg Bathing: 3: Mod-Patient completes 5-7 65f 10 parts or 50-74%  FIM - Upper Body Dressing/Undressing Upper body dressing/undressing steps patient completed: Thread/unthread right sleeve of pullover shirt/dresss;Thread/unthread left sleeve of pullover shirt/dress;Put head through opening of pull over shirt/dress;Pull shirt over trunk Upper body dressing/undressing: 5: Set-up assist to: Obtain clothing/put away FIM - Lower Body Dressing/Undressing Lower body dressing/undressing steps patient completed: Thread/unthread right underwear leg;Thread/unthread left underwear leg;Pull underwear up/down;Thread/unthread right pants leg;Thread/unthread left pants leg;Pull pants up/down Lower  body dressing/undressing: 3: Mod-Patient completed 50-74% of tasks  FIM - Toileting Toileting: 0: Activity did not occur  FIM - Archivist Transfers: 0-Activity did not occur  FIM - Banker Devices: Therapist, occupational: 5: Supine > Sit: Supervision (verbal cues/safety issues);5: Sit > Supine: Supervision (verbal cues/safety issues);4: Bed > Chair or W/C: Min A (steadying Pt. > 75%);4: Chair or W/C > Bed: Min A (steadying Pt. > 75%)  FIM - Locomotion: Wheelchair Locomotion: Wheelchair: 0: Activity did not occur FIM - Locomotion: Ambulation Locomotion: Ambulation Assistive Devices: Dealer Ambulation/Gait Assistance: 4: Min assist Locomotion: Ambulation: 1: Travels less than 50 ft with minimal assistance (Pt.>75%)  Comprehension Comprehension Mode: Auditory Comprehension: 7-Follows complex conversation/direction: With no assist  Expression Expression Mode: Verbal Expression: 7-Expresses complex ideas: With no assist  Social Interaction Social Interaction: 6-Interacts appropriately with others with medication or extra time (anti-anxiety, antidepressant).  Problem Solving Problem Solving: 5-Solves complex 90% of the time/cues < 10% of the time  Memory Memory: 6-More than reasonable amt of time  Medical Problem List and Plan:  1. Deconditioning after prolonged Clostridium difficile. Continue by mouth vancomycin as directed/ florastor  2. DVT Prophylaxis/Anticoagulation: Subcutaneous Lovenox. Monitor platelet counts and any signs of bleeding  3. Pain Management: Hydrocodone as needed. Monitor with increased mobility  4. Mood/depression. Lexapro 10 mg daily, Valium 10 mg every 8 hours as needed anxiety and Effexor 75 mg daily. Provide emotional support and positive reinforcement  5. Neuropsych: This patient is capable of making decisions on his/her own behalf.  6. Hypokalemia. Followup chemistries  7. BPH. Hytrin. Check PVRs x3  8. Decreased nutritional storage. Followup with dietary services  9. Hyperlipidemia. Zocor  LOS (Days) 2 A FACE TO FACE EVALUATION WAS PERFORMED  KIRSTEINS,ANDREW E 05/17/2012, 8:42 AM

## 2012-05-18 ENCOUNTER — Inpatient Hospital Stay (HOSPITAL_COMMUNITY): Payer: Medicare Other

## 2012-05-18 ENCOUNTER — Inpatient Hospital Stay (HOSPITAL_COMMUNITY): Payer: Medicare Other | Admitting: Occupational Therapy

## 2012-05-18 DIAGNOSIS — A0472 Enterocolitis due to Clostridium difficile, not specified as recurrent: Secondary | ICD-10-CM

## 2012-05-18 DIAGNOSIS — R5381 Other malaise: Secondary | ICD-10-CM

## 2012-05-18 DIAGNOSIS — E876 Hypokalemia: Secondary | ICD-10-CM

## 2012-05-18 LAB — COMPREHENSIVE METABOLIC PANEL
ALT: 35 U/L (ref 0–53)
AST: 31 U/L (ref 0–37)
Albumin: 2.8 g/dL — ABNORMAL LOW (ref 3.5–5.2)
Alkaline Phosphatase: 85 U/L (ref 39–117)
BUN: 13 mg/dL (ref 6–23)
CO2: 27 mEq/L (ref 19–32)
Calcium: 8.7 mg/dL (ref 8.4–10.5)
Chloride: 105 mEq/L (ref 96–112)
Creatinine, Ser: 0.73 mg/dL (ref 0.50–1.35)
GFR calc Af Amer: >90 mL/min (ref >90)
GFR calc non Af Amer: 83 mL/min — ABNORMAL LOW (ref >90)
Glucose, Bld: 151 mg/dL — ABNORMAL HIGH (ref 70–99)
Potassium: 4.1 mEq/L (ref 3.5–5.1)
Sodium: 142 mEq/L (ref 135–145)
Total Bilirubin: 0.2 mg/dL — ABNORMAL LOW (ref 0.3–1.2)
Total Protein: 5.9 g/dL — ABNORMAL LOW (ref 6.0–8.3)

## 2012-05-18 LAB — CBC WITH DIFFERENTIAL/PLATELET
Eosinophils Relative: 3 % (ref 0–5)
Hemoglobin: 12.2 g/dL — ABNORMAL LOW (ref 13.0–17.0)
Lymphocytes Relative: 32 % (ref 12–46)
Lymphs Abs: 3.2 10*3/uL (ref 0.7–4.0)
MCV: 95.4 fL (ref 78.0–100.0)
Monocytes Relative: 15 % — ABNORMAL HIGH (ref 3–12)
Neutrophils Relative %: 50 % (ref 43–77)
Platelets: 202 10*3/uL (ref 150–400)
RBC: 3.89 MIL/uL — ABNORMAL LOW (ref 4.22–5.81)
WBC: 9.9 10*3/uL (ref 4.0–10.5)

## 2012-05-18 LAB — HEMOGLOBIN A1C
Hgb A1c MFr Bld: 6 % — ABNORMAL HIGH (ref <5.7)
Mean Plasma Glucose: 126 mg/dL — ABNORMAL HIGH (ref <117)

## 2012-05-18 MED ORDER — SACCHAROMYCES BOULARDII 250 MG PO CAPS
500.0000 mg | ORAL_CAPSULE | Freq: Two times a day (BID) | ORAL | Status: DC
  Administered 2012-05-18 – 2012-05-27 (×18): 500 mg via ORAL
  Filled 2012-05-18 (×22): qty 2

## 2012-05-18 NOTE — Progress Notes (Signed)
Patient information reviewed and entered into eRehab system by Marie Noel, RN, CRRN, PPS Coordinator.  Information including medical coding and functional independence measure will be reviewed and updated through discharge.     Per nursing patient was given "Data Collection Information Summary for Patients in Inpatient Rehabilitation Facilities with attached "Privacy Act Statement-Health Care Records" upon admission.  

## 2012-05-18 NOTE — Progress Notes (Signed)
Physical Therapy Session Note  Patient Details  Name: SID GREENER MRN: [id number] Date of Birth: 28-Feb-1927  Today's Date: 05/18/2012 Time: 1530-1600 Time Calculation (min): 30 min   Skilled Therapeutic Interventions/Progress Updates:    Treatment focused on overall activity tolerance/endurance, strengthening, and stair training with min A (reciprocal pattern noted up/down 10 steps) in preparation for d/c home/community. Nustep x 10 min on level 5 (rest break after 5 min) for endurance and strengthening. Transfers close S/steady A from bed <-> w/c and returned to supine with LE's elevated at end of session.  Therapy Documentation Precautions:  Precautions Precautions: Fall;Other (comment) Precaution Comments: c- diff contact Restrictions Weight Bearing Restrictions: No  Pain: No complaints.  See FIM for current functional status  Therapy/Group: Individual Therapy  [name] [facility] 05/18/2012, 4:20 PM

## 2012-05-18 NOTE — Progress Notes (Signed)
Occupational Therapy Session Notes  Patient Details  Name: Randall Odom MRN: [id number] Date of Birth: [birth year]  Today's Date: 05/18/2012  Short Term Goals: Week 1:  OT Short Term Goal 1 (Week 1): Pt will complete walk-in shower transfer with supervision OT Short Term Goal 2 (Week 1): Pt will complete toilet transfer with supervision with necessary AD OT Short Term Goal 3 (Week 1): Pt will complete LB dressing with min assist using AE PRN OT Short Term Goal 4 (Week 1): Pt will complete 2 grooming tasks in standing with  supervision.  Skilled Therapeutic Interventions/Progress Updates:   Session #1 [phone] - 55 Minutes Individual Therapy No complaints of pain Patient found supine in bed with HOB raised. Therapist lowered HOB and patient engaged in bed mobility with bed flat, using bed rails only; supervision level. Patient then ambulated from edge of bed -> bathroom for shower stall transfer onto tub transfer bench. Bathing completed at shower level with moderate assistance. Patient then ambulated from bench -> elevated toilet seat with minimal assistance (loss of balance during transfer, therapist had to assist with LOB episode). Patient then performed toileting tasks & donning of pants with supervision, then ambulated -> sink for grooming tasks in standing position with steady assist. Patient sat edge of bed to donn shirt and requested to get back to bed at end of session. Focused skilled intervention on bed mobility, functional ambulation/mobility using rolling walker, safety with rolling walker, UB/LB bathing & dressing, sit<>stands, dynamic standing balance/tolerance/endurance, ADL transfers, and overall activity tolerance/endurance. Left patient in supine position with call bell & phone within reach.   Session #2 1305-1400 - 55 Minutes Individual Therapy No complaints of pain Patient found ambulating -> bathroom using rolling walker with NT. Patient performed toilet transfer with  supervision and toileting tasks (peri care and clothing management with supervision). Patient then ambulated from bathroom -> sink to wash hands -> w/c seated outside of room door. Patient then propelled self from room -> ADL apartment for focus on functional ambulation/mobility using rolling walker, sit<>stands, dynamic standing balance/tolerance/endurance, safety with rolling walker, correct body mechanics during sit<>stands, simulated shower stall transfer using shower stall block & shower seat, and overall activity tolerance/endurance. At end of session therapist propelled patient back to room and left supine in bed with call bell & phone within reach.   Precautions:  Precautions Precautions: Fall;Other (comment) Precaution Comments: c- diff contact Restrictions Weight Bearing Restrictions: No  See FIM for current functional status  CLAY,PATRICIA 05/18/2012, 7:43 AM

## 2012-05-18 NOTE — Progress Notes (Signed)
Inpatient Rehabilitation Center Individual Statement of Services  Patient Name:  Randall Odom  Date:  05/18/2012  Welcome to the Inpatient Rehabilitation Center.  Our goal is to provide you with an individualized program based on your diagnosis and situation, designed to meet your specific needs.  With this comprehensive rehabilitation program, you will be expected to participate in at least 3 hours of rehabilitation therapies Monday-Friday, with modified therapy programming on the weekends.  Your rehabilitation program will include the following services:  Physical Therapy (PT), Occupational Therapy (OT), 24 hour per day rehabilitation nursing, Therapeutic Recreaction (TR), Case Management (Social Worker), Rehabilitation Medicine, Nutrition Services and Pharmacy Services  Weekly team conferences will be held on Tuesdays to discuss your progress.  Your  Social Worker will talk with you frequently to get your input and to update you on team discussions.  Team conferences with you and your family in attendance may also be held.  Expected length of stay: 10-14 days  Overall anticipated outcome: modified independent  Depending on your progress and recovery, your program may change.  Your  Social Worker will coordinate services and will keep you informed of any changes.  Your  Social Worker's name and contact numbers are listed  below.  The following services may also be recommended but are not provided by the Inpatient Rehabilitation Center:    Home Health Rehabiltiation Services  Outpatient Rehabilitatation Servives    Arrangements will be made to provide these services after discharge if needed.  Arrangements include referral to agencies that provide these services.  Your insurance has been verified to be:  Medicare and BCBS Your primary doctor is:  Dr. Felipa Eth  Pertinent information will be shared with your doctor and your insurance company.  Social Worker:  [place], [place] [phone] or  ([phone]  Information discussed with and copy given to patient by: Randall Odom, 05/18/2012, 1:36 PM

## 2012-05-18 NOTE — Progress Notes (Signed)
Physical Therapy Session Note  Patient Details  Name: Randall Odom MRN: [id number] Date of Birth: 08-29-1926  Today's Date: 05/18/2012 Time: 1015-1100 Time Calculation (min): 45 min  Skilled Therapeutic Interventions/Progress Updates:    Reports increased edema in LE's; donned compression Ted hose and propped up LE's at end of therapy session. Focused on transfers, gait, and therex during session. Required cues for transfers and safe use of RW; tends not to use it during transfer and pulls up on walker to stand. Gait training in hallway x 120' x 2 with emphasis on posture, cadence, increasing stride length, and widening BOS; pt with festinating shuffled gait pattern and LOB due to narrow BOS. Seated therex including LAQ and marches x 10 reps x 2 sets each bilaterally with 5 second hold while therapist adjusted w/c seating for appropriate posture. Transferred back to bed to rest end of session with min A; cues for safe technique.  Therapy Documentation Precautions:  Precautions Precautions: Fall;Other (comment) Precaution Comments: c- diff contact Restrictions Weight Bearing Restrictions: No   Pain: No complaints.   Locomotion : Ambulation Ambulation/Gait Assistance: 4: Min assist   See FIM for current functional status  Therapy/Group: Individual Therapy  [name] [facility] 05/18/2012, 12:10 PM

## 2012-05-18 NOTE — Progress Notes (Signed)
Social Work  Social Work Assessment and Plan  Patient Details  Name: Randall Odom MRN: [id number] Date of Birth: 11-12-1926  Today's Date: 05/18/2012  Problem List:  Patient Active Problem List  Diagnosis  . Syncope and collapse  . Diabetes mellitus  . Pacemaker  . HTN (hypertension)  . CAD (coronary artery disease)  . Diarrhea  . Acute renal failure  . Hypokalemia  . C. difficile colitis  . Dehydration  . Physical deconditioning   Past Medical History:  Past Medical History  Diagnosis Date  . Hypertension   . Muscle spasm   . Diabetes mellitus   . Enlarged prostate   . CAD (coronary artery disease)   . Pacemaker    Past Surgical History:  Past Surgical History  Procedure Laterality Date  . Pacemaker insertion     Social History:  reports that he has never smoked. He does not have any smokeless tobacco history on file. He reports that he does not drink alcohol or use illicit drugs.  Family / Support Systems Marital Status: Married How Long?: 65 yrs Patient Roles: Spouse;Parent Spouse/Significant Other: wife, Jc Veron @ [phone] Children: son, Marion Rosenberry - local, retired and very supportive per pt Anticipated Caregiver: wife and self Ability/Limitations of Caregiver: Wife is retired and can assist with supervision at home Caregiver Availability: 24/7 Family Dynamics: pt describes wife and son as very supportive, however, does note that wife is 55 y.o.  Social History Preferred language: English Religion: Baptist Cultural Background: NA Education: college degree Read: Yes Write: Yes Employment Status: Retired Date Retired/Disabled/Unemployed: approx 5 years Fish farm manager Issues: None Guardian/Conservator: None   Abuse/Neglect Physical Abuse: Denies Verbal Abuse: Denies Sexual Abuse: Denies Exploitation of patient/patient's resources: Denies Self-Neglect: Denies  Emotional Status Pt's affect, behavior adn adjustment status: Pt  very pleasant, oriented and talkative.  Motivated for therapies and hopeful he can reach an independent level,  "I don't care if it takes 3 or 4 weeks, I want to be independent".    Pt. denies any s/s of significant emotional distress - will monitor throughout stay. Recent Psychosocial Issues: declining physical health over past few years and especially past few weeks Pyschiatric History: None Substance Abuse History: None  Patient / Family Perceptions, Expectations & Goals Pt/Family understanding of illness & functional limitations: Pt able to give basic report/ information about his illness and current deconditioned state/ need for CIR Premorbid pt/family roles/activities: Pt was very weakened PTA, using lift chair and had a former Diplomatic Services operational officer to "run errands for me".   Son did most driving for pt and wife and helped with home management. Anticipated changes in roles/activities/participation: Little change anticipated - likely that son will need to continue to assist Pt/family expectations/goals: "I want to be able to get around on my own."  [employer]: None Premorbid Home Care/DME Agencies: None Transportation available at discharge: yes  Discharge Planning Living Arrangements: Spouse/significant other Support Systems: Children;Parent;Friends/neighbors;Church/faith community Type of Residence: Private residence Community education officer Resources: Administrator (specify) Herbalist) Financial Resources: Restaurant manager, fast food Screen Referred: No Living Expenses: Own Money Management: Patient Do you have any problems obtaining your medications?: No Home Management: son assisted wife and pt with assist Patient/Family Preliminary Plans: Pt plans to return home with wife providing supervision and intermittent assist of son Social Work Anticipated Follow Up Needs: HH/OP Expected length of stay: 7-12 days  Clinical Impression Very pleasant, oriented gentleman here  after lengthy illness and deconditioned.  Good family support.  Motivated for CIR and optimistic.  No s/s of any significant emotional distress.  Will follow for assist with d/c plans.  HOYLE, LUCY 05/18/2012, 1:35 PM

## 2012-05-18 NOTE — Progress Notes (Signed)
Patient ID: Randall Odom, male   DOB: 08-Apr-1926, 79 y.o.   MRN: [id number] Subjective/Complaints: Stools almost formed. Typically has them after meals. Review of Systems  Gastrointestinal: Negative for nausea, vomiting, abdominal pain and constipation.  All other systems reviewed and are negative.   Objective: Vital Signs: Blood pressure 131/74, pulse 64, temperature 97.3 F (36.3 C), temperature source Oral, resp. rate 16, height 5\' 10"  (1.778 m), weight 94.076 kg (207 lb 6.4 oz), SpO2 94.00%. No results found. Results for orders placed during the hospital encounter of 05/15/12 (from the past 72 hour(s))  CBC     Status: Abnormal   Collection Time    05/15/12  3:40 PM      Result Value Range   WBC 9.1  4.0 - 10.5 K/uL   RBC 3.75 (*) 4.22 - 5.81 MIL/uL   Hemoglobin 12.0 (*) 13.0 - 17.0 g/dL   HCT 08.6 (*) 57.8 - 46.9 %   MCV 93.1  78.0 - 100.0 fL   MCH 32.0  26.0 - 34.0 pg   MCHC 34.4  30.0 - 36.0 g/dL   RDW 62.9  52.8 - 41.3 %   Platelets 218  150 - 400 K/uL  CREATININE, SERUM     Status: Abnormal   Collection Time    05/15/12  3:40 PM      Result Value Range   Creatinine, Ser 0.82  0.50 - 1.35 mg/dL   GFR calc non Af Amer 80 (*) >90 mL/min   GFR calc Af Amer >90  >90 mL/min   Comment:            The eGFR has been calculated     using the CKD EPI equation.     This calculation has not been     validated in all clinical     situations.     eGFR's persistently     <90 mL/min signify     possible Chronic Kidney Disease.  GLUCOSE, CAPILLARY     Status: None   Collection Time    05/16/12  5:46 AM      Result Value Range   Glucose-Capillary 94  70 - 99 mg/dL   Comment 1 Notify RN       HEENT: normal Cardio: RRR Resp: CTA B/L GI: BS positive and Distention Extremity:  No Edema Skin:   Intact Neuro: Alert/Oriented, Normal Sensory, Abnormal Motor 4/5 R Bi, tri , grip otherwise 5/5 and Normal FMC Musc/Skel:  Normal Gen NAD   Assessment/Plan: 1. Functional  deficits secondary to deconditioning after severe bout of C Diff which require 3+ hours per day of interdisciplinary therapy in a comprehensive inpatient rehab setting. Physiatrist is providing close team supervision and 24 hour management of active medical problems listed below. Physiatrist and rehab team continue to assess barriers to discharge/monitor patient progress toward functional and medical goals. FIM: FIM - Bathing Bathing Steps Patient Completed: Chest;Right Arm;Left Arm;Abdomen;Right upper leg;Left upper leg Bathing: 3: Mod-Patient completes 5-7 33f 10 parts or 50-74%  FIM - Upper Body Dressing/Undressing Upper body dressing/undressing steps patient completed: Thread/unthread right sleeve of pullover shirt/dresss;Thread/unthread left sleeve of pullover shirt/dress;Put head through opening of pull over shirt/dress;Pull shirt over trunk Upper body dressing/undressing: 5: Set-up assist to: Obtain clothing/put away FIM - Lower Body Dressing/Undressing Lower body dressing/undressing steps patient completed: Thread/unthread right underwear leg;Thread/unthread left underwear leg;Pull underwear up/down;Thread/unthread right pants leg;Thread/unthread left pants leg;Pull pants up/down Lower body dressing/undressing: 5: Set-up assist to: Obtain clothing  FIM -  Toileting Toileting steps completed by patient: Adjust clothing prior to toileting;Performs perineal hygiene;Adjust clothing after toileting Toileting Assistive Devices: Grab bar or rail for support Toileting: 5: Supervision: Safety issues/verbal cues  FIM - Diplomatic Services operational officer Devices: Art gallery manager Transfers: 5-To toilet/BSC: Supervision (verbal cues/safety issues);5-From toilet/BSC: Supervision (verbal cues/safety issues)  FIM - Banker Devices: Therapist, occupational: 5: Supine > Sit: Supervision (verbal cues/safety issues);5: Bed > Chair or W/C: Supervision  (verbal cues/safety issues)  FIM - Locomotion: Wheelchair Locomotion: Wheelchair: 0: Activity did not occur FIM - Locomotion: Ambulation Locomotion: Ambulation Assistive Devices: Designer, industrial/product Ambulation/Gait Assistance: 4: Min assist Locomotion: Ambulation: 1: Travels less than 50 ft with minimal assistance (Pt.>75%)  Comprehension Comprehension Mode: Auditory Comprehension: 7-Follows complex conversation/direction: With no assist  Expression Expression Mode: Verbal Expression: 7-Expresses complex ideas: With no assist  Social Interaction Social Interaction: 6-Interacts appropriately with others with medication or extra time (anti-anxiety, antidepressant).  Problem Solving Problem Solving: 5-Solves complex 90% of the time/cues < 10% of the time  Memory Memory: 6-More than reasonable amt of time  Medical Problem List and Plan:  1. Deconditioning after prolonged Clostridium difficile. Continue by mouth vancomycin as directed/ florastor  2. DVT Prophylaxis/Anticoagulation: Subcutaneous Lovenox. Monitor platelet counts and any signs of bleeding  3. Pain Management: Hydrocodone as needed. Monitor with increased mobility  4. Mood/depression. Lexapro 10 mg daily, Valium 10 mg every 8 hours as needed anxiety and Effexor 75 mg daily. Provide emotional support and positive reinforcement  5. Neuropsych: This patient is capable of making decisions on his/her own behalf.  6. Hypokalemia. Follow up chemistries today 7. BPH. Hytrin. Check PVRs x3  8. Decreased nutritional storage. Followup with dietary service 9. Hyperlipidemia. Zocor 10. Florastor to help with stool bulk. LOS (Days) 3 A FACE TO FACE EVALUATION WAS PERFORMED  SWARTZ,ZACHARY T 05/18/2012, 7:59 AM

## 2012-05-19 ENCOUNTER — Inpatient Hospital Stay (HOSPITAL_COMMUNITY): Payer: Medicare Other

## 2012-05-19 ENCOUNTER — Inpatient Hospital Stay (HOSPITAL_COMMUNITY): Payer: Medicare Other | Admitting: Occupational Therapy

## 2012-05-19 NOTE — Progress Notes (Signed)
Occupational Therapy Session Note  Patient Details  Name: Randall Odom MRN: [id number] Date of Birth: [birth year]  Today's Date: 05/19/2012 Time: 0900-1000 Time Calculation (min): 60 min  Short Term Goals: Week 1:  OT Short Term Goal 1 (Week 1): Pt will complete walk-in shower transfer with supervision OT Short Term Goal 2 (Week 1): Pt will complete toilet transfer with supervision with necessary AD OT Short Term Goal 3 (Week 1): Pt will complete LB dressing with min assist using AE PRN OT Short Term Goal 4 (Week 1): Pt will complete 2 grooming tasks in standing with  supervision.  Skilled Therapeutic Interventions/Progress Updates:    Pt seated in w/c with RN at side.  Pt stated he had just used bathroom.  Pt declined bathing and dressing this morning stating that he took a good shower previous day and just donned clothing.  Pt agreeable to grooming/shaving while standing at sink.  Pt amb with RW into hallway to transition to gym.  Pt engaged in dynamic standing balance tasks and BUE exercises.  Pt required verbal cues for safety awareness with standing and functional amb with RW.  Therapy Documentation Precautions:  Precautions Precautions: Fall;Other (comment) Precaution Comments: c- diff contact Restrictions Weight Bearing Restrictions: No   Pain: Pain Assessment Pain Assessment: No/denies pain  See FIM for current functional status  Therapy/Group: Individual Therapy  Rich Brave 05/19/2012, 10:02 AM

## 2012-05-19 NOTE — Progress Notes (Signed)
Occupational Therapy Session Note  Patient Details  Name: Randall Odom MRN: [id number] Date of Birth: 1927/01/05  Today's Date: 05/19/2012 Time: 6295-2841 Time Calculation (min): 30 min  Short Term Goals: Week 1:  OT Short Term Goal 1 (Week 1): Pt will complete walk-in shower transfer with supervision OT Short Term Goal 2 (Week 1): Pt will complete toilet transfer with supervision with necessary AD OT Short Term Goal 3 (Week 1): Pt will complete LB dressing with min assist using AE PRN OT Short Term Goal 4 (Week 1): Pt will complete 2 grooming tasks in standing with  supervision.  Skilled Therapeutic Interventions/Progress Updates:  Patient found supine in bed. Patient ambulated from edge of bed -> sink for washing of hands in standing. Patient then sat in w/c and therapist propelled patient from room -> ADL apartment. While in ADL apartment focused skilled intervention on w/c pushups (3 sets of 10 reps), functional ambulation/mobility using rolling walker on various walking surfaces and around obstacles, sit<>stands, dynamic standing balance/tolerance/endurance, safety with rolling walker, and overall activity tolerance/endurance. Patient propelled self back to room in w/c and ambulated into bathroom for toilet transfer and toileting. Left patient seated on elevated toilet seat and patient stated he would pull call bell when finished.   Precautions:  Precautions Precautions: Fall;Other (comment) Precaution Comments: c- diff contact Restrictions Weight Bearing Restrictions: No  See FIM for current functional status  Therapy/Group: Individual Therapy  CLAY,PATRICIA 05/19/2012, 3:07 PM

## 2012-05-19 NOTE — Patient Care Conference (Signed)
Inpatient RehabilitationTeam Conference and Plan of Care Update Date: 05/19/2012   Time: 2:10 PM    Patient Name: Randall Odom      Medical Record Number: [id number]  Date of Birth: 1927/02/27 Sex: Male         Room/Bed: 4010/4010-01 Payor Info: Payor: MEDICARE  Plan: MEDICARE PART A AND B  Product Type: *No Product type*     Admitting Diagnosis: DECONDITIONED  Admit Date/Time:  05/15/2012  3:24 PM Admission Comments: No comment available   Primary Diagnosis:  Physical deconditioning Principal Problem: Physical deconditioning  Patient Active Problem List   Diagnosis Date Noted  . Physical deconditioning 05/18/2012  . C. difficile colitis 05/13/2012  . Dehydration 05/13/2012  . Diarrhea 05/12/2012  . Acute renal failure 05/12/2012  . Hypokalemia 05/12/2012  . Syncope and collapse 05/06/2011  . Diabetes mellitus 05/06/2011  . Pacemaker 05/06/2011  . HTN (hypertension) 05/06/2011  . CAD (coronary artery disease) 05/06/2011    Expected Discharge Date: Expected Discharge Date: 05/27/12  Team Members Present: Physician leading conference: Dr. Faith Rogue Social Worker Present: Amada Jupiter, LCSW Nurse Present: Daryll Brod, RN PT Present: [name], PT OT Present: Mackie Pai, OT;Patricia Mat Carne, OT Other (Discipline and Name): Ottie Glazier, RN     Current Status/Progress Goal Weekly Team Focus  Medical   loose stools improving. very deconditioned. lower extremity edema  increase stamina, regulate bowels  normalize intake, bowel function, and electrolytes   Bowel/Bladder   Continent of bowel and bladder. LBM 05/17/12  Pt to remain continent of bowel and bladder  Monitor   Swallow/Nutrition/ Hydration             ADL's   overall minimal assistance  supervision -> mod I, plan to downgrade to overall supervision  ADL retraining, ADL transfers, sit<>stands, dynamic standing, overall safety, overall activity tolerance/endurance   Mobility   min A overall  mod I  overall  endurance/activity tolerance, gait training, balance, functional strengthening   Communication             Safety/Cognition/ Behavioral Observations            Pain   No c/o pain  <3  Offer pain medication    Skin   L knee abrasion, OTA, unremarkable  No additonal skin breakdown  Monitor, Routine turn q 2 hrs    Rehab Goals Patient on target to meet rehab goals: Yes *See Interdisciplinary Assessment and Plan and progress notes for long and short-term goals  Barriers to Discharge: impaired balance and safety awareness    Possible Resolutions to Barriers:  supervision at home. adapative equipment, education    Discharge Planning/Teaching Needs:  home with wife who can only provide supervision      Team Discussion:  Very deconditioned and may need to decrease goals.  Expect could reach supervision level at d/c.    Revisions to Treatment Plan:  Expect to decrease goals to supervision   Continued Need for Acute Rehabilitation Level of Care: The patient requires daily medical management by a physician with specialized training in physical medicine and rehabilitation for the following conditions: Daily direction of a multidisciplinary physical rehabilitation program to ensure safe treatment while eliciting the highest outcome that is of practical value to the patient.: Yes Daily medical management of patient stability for increased activity during participation in an intensive rehabilitation regime.: Yes Daily analysis of laboratory values and/or radiology reports with any subsequent need for medication adjustment of medical intervention for : Neurological problems (GI  complications)  HOYLE, LUCY 05/19/2012, 6:29 PM

## 2012-05-19 NOTE — Progress Notes (Signed)
Patient ID: Randall Odom, male   DOB: 09-04-1926, 79 y.o.   MRN: [id number] Subjective/Complaints: Stools more formed but not normal. Notes some edema in legs Review of Systems  Gastrointestinal: Negative for nausea, vomiting, abdominal pain and constipation.  All other systems reviewed and are negative.   Objective: Vital Signs: Blood pressure 123/53, pulse 67, temperature 98 F (36.7 C), temperature source Oral, resp. rate 20, height 5\' 10"  (1.778 m), weight 94.076 kg (207 lb 6.4 oz), SpO2 97.00%. No results found. Results for orders placed during the hospital encounter of 05/15/12 (from the past 72 hour(s))  HEMOGLOBIN A1C     Status: Abnormal   Collection Time    05/18/12  8:30 AM      Result Value Range   Hemoglobin A1C 6.0 (*) <5.7 %   Comment: (NOTE)                                                                               According to the ADA Clinical Practice Recommendations for 2011, when     HbA1c is used as a screening test:      >=6.5%   Diagnostic of Diabetes Mellitus               (if abnormal result is confirmed)     5.7-6.4%   Increased risk of developing Diabetes Mellitus     References:Diagnosis and Classification of Diabetes Mellitus,Diabetes     Care,2011,34(Suppl 1):S62-S69 and Standards of Medical Care in             Diabetes - 2011,Diabetes Care,2011,34 (Suppl 1):S11-S61.   Mean Plasma Glucose 126 (*) <117 mg/dL  CBC WITH DIFFERENTIAL     Status: Abnormal   Collection Time    05/18/12  8:30 AM      Result Value Range   WBC 9.9  4.0 - 10.5 K/uL   RBC 3.89 (*) 4.22 - 5.81 MIL/uL   Hemoglobin 12.2 (*) 13.0 - 17.0 g/dL   HCT 11.9 (*) 14.7 - 82.9 %   MCV 95.4  78.0 - 100.0 fL   MCH 31.4  26.0 - 34.0 pg   MCHC 32.9  30.0 - 36.0 g/dL   RDW 56.2  13.0 - 86.5 %   Platelets 202  150 - 400 K/uL   Neutrophils Relative 50  43 - 77 %   Neutro Abs 4.9  1.7 - 7.7 K/uL   Lymphocytes Relative 32  12 - 46 %   Lymphs Abs 3.2  0.7 - 4.0 K/uL   Monocytes Relative  15 (*) 3 - 12 %   Monocytes Absolute 1.5 (*) 0.1 - 1.0 K/uL   Eosinophils Relative 3  0 - 5 %   Eosinophils Absolute 0.3  0.0 - 0.7 K/uL   Basophils Relative 0  0 - 1 %   Basophils Absolute 0.0  0.0 - 0.1 K/uL  COMPREHENSIVE METABOLIC PANEL     Status: Abnormal   Collection Time    05/18/12  8:30 AM      Result Value Range   Sodium 142  135 - 145 mEq/L   Potassium 4.1  3.5 - 5.1 mEq/L   Chloride 105  96 - 112  mEq/L   CO2 27  19 - 32 mEq/L   Glucose, Bld 151 (*) 70 - 99 mg/dL   BUN 13  6 - 23 mg/dL   Creatinine, Ser 5.78  0.50 - 1.35 mg/dL   Calcium 8.7  8.4 - 46.9 mg/dL   Total Protein 5.9 (*) 6.0 - 8.3 g/dL   Albumin 2.8 (*) 3.5 - 5.2 g/dL   AST 31  0 - 37 U/L   ALT 35  0 - 53 U/L   Alkaline Phosphatase 85  39 - 117 U/L   Total Bilirubin 0.2 (*) 0.3 - 1.2 mg/dL   GFR calc non Af Amer 83 (*) >90 mL/min   GFR calc Af Amer >90  >90 mL/min   Comment:            The eGFR has been calculated     using the CKD EPI equation.     This calculation has not been     validated in all clinical     situations.     eGFR's persistently     <90 mL/min signify     possible Chronic Kidney Disease.     HEENT: normal Cardio: RRR Resp: CTA B/L GI: BS positive and Distention Extremity:  Trace ankle Edema- wearing TEDs Skin:   Intact Neuro: Alert/Oriented, Normal Sensory, Abnormal Motor 4/5 R Bi, tri , grip otherwise 5/5 and Normal FMC Musc/Skel:  Normal Gen NAD   Assessment/Plan: 1. Functional deficits secondary to deconditioning after severe bout of C Diff which require 3+ hours per day of interdisciplinary therapy in a comprehensive inpatient rehab setting. Physiatrist is providing close team supervision and 24 hour management of active medical problems listed below. Physiatrist and rehab team continue to assess barriers to discharge/monitor patient progress toward functional and medical goals. FIM: FIM - Bathing Bathing Steps Patient Completed: Chest;Right Arm;Left Arm;Abdomen;Left  upper leg;Right upper leg Bathing: 3: Mod-Patient completes 5-7 61f 10 parts or 50-74%  FIM - Upper Body Dressing/Undressing Upper body dressing/undressing steps patient completed: Thread/unthread right sleeve of pullover shirt/dresss;Thread/unthread left sleeve of pullover shirt/dress;Put head through opening of pull over shirt/dress;Pull shirt over trunk Upper body dressing/undressing: 5: Set-up assist to: Obtain clothing/put away FIM - Lower Body Dressing/Undressing Lower body dressing/undressing steps patient completed: Thread/unthread right pants leg;Thread/unthread left pants leg;Pull pants up/down Lower body dressing/undressing: 4: Min-Patient completed 75 plus % of tasks  FIM - Toileting Toileting steps completed by patient: Adjust clothing prior to toileting;Performs perineal hygiene;Adjust clothing after toileting Toileting Assistive Devices: Grab bar or rail for support Toileting: 5: Supervision: Safety issues/verbal cues  FIM - Diplomatic Services operational officer Devices: Elevated toilet seat;Walker;Grab bars Toilet Transfers: 4-To toilet/BSC: Min A (steadying Pt. > 75%);5-From toilet/BSC: Supervision (verbal cues/safety issues)  FIM - Banker Devices: Walker;Arm rests;Bed rails Bed/Chair Transfer: 5: Supine > Sit: Supervision (verbal cues/safety issues);5: Sit > Supine: Supervision (verbal cues/safety issues);4: Bed > Chair or W/C: Min A (steadying Pt. > 75%);4: Chair or W/C > Bed: Min A (steadying Pt. > 75%)  FIM - Locomotion: Wheelchair Locomotion: Wheelchair: 1: Total Assistance/staff pushes wheelchair (Pt<25%) FIM - Locomotion: Ambulation Locomotion: Ambulation Assistive Devices: Designer, industrial/product Ambulation/Gait Assistance: 4: Min assist Locomotion: Ambulation: 2: Travels 50 - 149 ft with minimal assistance (Pt.>75%)  Comprehension Comprehension Mode: Auditory Comprehension: 6-Follows complex conversation/direction: With  extra time/assistive device  Expression Expression Mode: Verbal Expression: 7-Expresses complex ideas: With no assist  Social Interaction Social Interaction: 6-Interacts appropriately with others with medication  or extra time (anti-anxiety, antidepressant).  Problem Solving Problem Solving: 6-Solves complex problems: With extra time  Memory Memory: 5-Recognizes or recalls 90% of the time/requires cueing < 10% of the time  Medical Problem List and Plan:  1. Deconditioning after prolonged Clostridium difficile. Continue by mouth vancomycin as directed   2. DVT Prophylaxis/Anticoagulation: Subcutaneous Lovenox. Monitor platelet counts and any signs of bleeding  3. Pain Management: Hydrocodone as needed. Monitor with increased mobility  4. Mood/depression. Lexapro 10 mg daily, Valium 10 mg every 8 hours as needed anxiety and Effexor 75 mg daily. Provide emotional support and positive reinforcement  5. Neuropsych: This patient is capable of making decisions on his/her own behalf.  6. Hypokalemia. Follow up chemistries ok. Encourage PO 7. BPH. Hytrin.   8. Decreased nutritional storage. Followup with dietary service 9. Hyperlipidemia. Zocor 10. Florastor to help with stool bulk. Still having slightly loose stools but they are trending to wards improvement LOS (Days) 4 A FACE TO FACE EVALUATION WAS PERFORMED  SWARTZ,ZACHARY T 05/19/2012, 8:05 AM

## 2012-05-19 NOTE — Progress Notes (Signed)
Physical Therapy Session Note  Patient Details  Name: Randall Odom MRN: [id number] Date of Birth: September 19, 1926  Today's Date: 05/19/2012 Time: 1015-1057 Time Calculation (min): 42 min  Skilled Therapeutic Interventions/Progress Updates:   Assisted pt with gait with RW to bathroom; S for transfer and for hygiene/clothing management. Treatment focused on gait with RW with emphasis on increasing step length, safety with AD, and widening BOS (shuffled and festinating gait pattern noted with distance ambulation); simulated car transfer with RW with S (Cues for safe hand placement and use of RW) in preparation for d/c; and neuro re-ed for dynamic standing balance activity for alternating toe taps with min A overall x 2 LOB with min to mod A to recover x 10 reps x 2 sets bilaterally. Pt fatigued at end of session and returned to bed with steady A.  Therapy Documentation Precautions:  Precautions Precautions: Fall;Other (comment) Precaution Comments: c- diff contact Restrictions Weight Bearing Restrictions: No   Pain: Pain Assessment Pain Assessment: No/denies pain  See FIM for current functional status  Therapy/Group: Individual Therapy  [name] [facility] 05/19/2012, 12:07 PM

## 2012-05-19 NOTE — Progress Notes (Signed)
Physical Therapy Session Note  Patient Details  Name: Randall Odom MRN: [id number] Date of Birth: 06/25/1926  Today's Date: 05/19/2012 Time: 7829-5621 Time Calculation (min): 57 min  Skilled Therapeutic Interventions/Progress Updates:    Initial part of session focused on gait training outdoors on uneven surfaces in preparation to return to community; overall min A with cues for safety with use of RW. Toileting in ADL apartment upon returning back to unit with overall close S using RW; cues for safety as pt leaving RW behind when going to the sink to wash hands. Nustep for overall endurance and strengthening x 10 min (rest break at 5 min mark) on level 5. Pt fatigued and required frequent rest breaks during session and returned to bed to rest with min A.  Therapy Documentation Precautions:  Precautions Precautions: Fall;Other (comment) Precaution Comments: c- diff contact Restrictions Weight Bearing Restrictions: No   Pain: Pain Assessment Pain Assessment: No/denies pain Mobility:   Locomotion : Ambulation Ambulation/Gait Assistance: 4: Min assist   See FIM for current functional status  Therapy/Group: Individual Therapy  [name] [facility] 05/19/2012, 1:40 PM

## 2012-05-20 ENCOUNTER — Inpatient Hospital Stay (HOSPITAL_COMMUNITY): Payer: Medicare Other

## 2012-05-20 ENCOUNTER — Inpatient Hospital Stay (HOSPITAL_COMMUNITY): Payer: Medicare Other | Admitting: Occupational Therapy

## 2012-05-20 DIAGNOSIS — E876 Hypokalemia: Secondary | ICD-10-CM

## 2012-05-20 DIAGNOSIS — R5381 Other malaise: Secondary | ICD-10-CM

## 2012-05-20 DIAGNOSIS — A0472 Enterocolitis due to Clostridium difficile, not specified as recurrent: Secondary | ICD-10-CM

## 2012-05-20 NOTE — Progress Notes (Signed)
Physical Therapy Session Note  Patient Details  Name: Randall Odom MRN: [id number] Date of Birth: 1926-05-27  Today's Date: 05/20/2012 Time: 1300-1400 Time Calculation (min): 60 min   Skilled Therapeutic Interventions/Progress Updates:    w/c mobility on unit for endurance and strengthening down to therapy gym. Issued handout and went through [place] HEP Level A for balance and strengthening including standing hip abduction, hamstring curls, heel raises, toe raises, and mini squats x 15 reps each. Dynamic gait through obstacle course for home environment simulation including navigating obstacles and stepping over various heights of objects; required min A overall and cues to stay inside RW during turns as well as safety with RW when stepping over items. Gait with RW x 150' with intermittent steady A back to room for endurance and strengthening. Pt needed to have BM and toileted with overall S. Returned to bed end of session to rest.  Therapy Documentation Precautions:  Precautions Precautions: Fall;Other (comment) Precaution Comments: c- diff contact Restrictions Weight Bearing Restrictions: No   Pain:  Denies pain.  See FIM for current functional status  Therapy/Group: Individual Therapy  [name] [facility] 05/20/2012, 2:12 PM

## 2012-05-20 NOTE — Progress Notes (Signed)
Physical Therapy Session Note  Patient Details  Name: HARL WIECHMANN MRN: [id number] Date of Birth: 12/13/1926  Today's Date: 05/20/2012 Time: 4696-2952 Time Calculation (min): 55 min  Skilled Therapeutic Interventions/Progress Updates:   Pt in bathroom upon entering room and assisted out with close S using RW; cues to continue to use RW for balance at sink. Pt reports clothing got soiled last night; assisted with clothing change with sit to stands to put on pants/underwear with S/steady A. Supine therex for LE and core strengthening to aid with mobility and balance with 2# ankle weights including heel slides, hip abduction, bridging, and reverse curls with therapy ball x 10 reps each bilaterally as appropriate. Sit to stands while holding therapy ball x 5 reps x 2 sets for balance and strengthening as well as upright posture once in standing bringing ball overhead. Gait in gym with RW with emphasis on gait pattern; increasing step length and cues for posture. Stair training with rails for community mobility and home entry practice. Rest breaks as needed as pt fatigues easily. Pt up in recliner at end of session to promote OOB tolerance.  Therapy Documentation Precautions:  Precautions Precautions: Fall;Other (comment) Precaution Comments: c- diff contact Restrictions Weight Bearing Restrictions: No  Pain:  Chronic pain in lower back - no intervention needed  See FIM for current functional status  Therapy/Group: Individual Therapy  [name] [facility] 05/20/2012, 9:29 AM

## 2012-05-20 NOTE — Progress Notes (Signed)
Occupational Therapy Session Notes  Patient Details  Name: Randall Odom MRN: [id number] Date of Birth: 08/03/1926  Today's Date: 05/20/2012  Short Term Goals: Week 1:  OT Short Term Goal 1 (Week 1): Pt will complete walk-in shower transfer with supervision OT Short Term Goal 2 (Week 1): Pt will complete toilet transfer with supervision with necessary AD OT Short Term Goal 3 (Week 1): Pt will complete LB dressing with min assist using AE PRN OT Short Term Goal 4 (Week 1): Pt will complete 2 grooming tasks in standing with  supervision.  Skilled Therapeutic Interventions/Progress Updates:   Session #1 1035-1130 - 55 Minutes Individual Therapy No complaints of pain Patient found seated in recliner beside bed. Patient ambulated from recliner -> bathroom for toilet transfer, toileting, then shower stall transfer on/off tub transfer bench. Patient then engaged in UB/LB bathing with supervision. Patient sat on elevated toilet seat to complete UB/LB dressing and then ambulated -> sink for combing of hair in standing. Patient continues to present with poor safety awareness regarding use of rolling walker and overall poor safety awareness with functional mobility.   Session #2 1430-1500 - 30 Minutes Individual Therapy No complaints of pain Patient found supine in bed. Patient engaged in bed mobility and sat edge of bed to donn bilateral shoes. Patient then ambulated -> w/c seated outside of room and therapist propelled patient -> therapy gym for therapeutic exercise using SCIFIT machine in standing position. Focused skilled intervention on sit<>stands, safety with rolling walker, bed mobility, UE strengthening, and overall activity tolerance/endurance. Therapist propelled patient back to room and patient transferred back to bed per his request, call bell & phone left within reach.   Precautions:  Precautions Precautions: Fall;Other (comment) Precaution Comments: c- diff  contact Restrictions Weight Bearing Restrictions: No  See FIM for current functional status  CLAY,PATRICIA 05/20/2012, 8:01 AM

## 2012-05-20 NOTE — Progress Notes (Signed)
Patient ID: Randall Odom, male   DOB: Jul 30, 1926, 79 y.o.   MRN: [id number] Subjective/Complaints: No new complaints. Happy with progress. Likes therapy Review of Systems  Gastrointestinal: Negative for nausea, vomiting, abdominal pain and constipation.  All other systems reviewed and are negative.   Objective: Vital Signs: Blood pressure 146/78, pulse 67, temperature 98.2 F (36.8 C), temperature source Oral, resp. rate 20, height 5\' 10"  (1.778 m), weight 94.076 kg (207 lb 6.4 oz), SpO2 92.00%. No results found. Results for orders placed during the hospital encounter of 05/15/12 (from the past 72 hour(s))  HEMOGLOBIN A1C     Status: Abnormal   Collection Time    05/18/12  8:30 AM      Result Value Range   Hemoglobin A1C 6.0 (*) <5.7 %   Comment: (NOTE)                                                                               According to the ADA Clinical Practice Recommendations for 2011, when     HbA1c is used as a screening test:      >=6.5%   Diagnostic of Diabetes Mellitus               (if abnormal result is confirmed)     5.7-6.4%   Increased risk of developing Diabetes Mellitus     References:Diagnosis and Classification of Diabetes Mellitus,Diabetes     Care,2011,34(Suppl 1):S62-S69 and Standards of Medical Care in             Diabetes - 2011,Diabetes Care,2011,34 (Suppl 1):S11-S61.   Mean Plasma Glucose 126 (*) <117 mg/dL  CBC WITH DIFFERENTIAL     Status: Abnormal   Collection Time    05/18/12  8:30 AM      Result Value Range   WBC 9.9  4.0 - 10.5 K/uL   RBC 3.89 (*) 4.22 - 5.81 MIL/uL   Hemoglobin 12.2 (*) 13.0 - 17.0 g/dL   HCT 78.4 (*) 69.6 - 29.5 %   MCV 95.4  78.0 - 100.0 fL   MCH 31.4  26.0 - 34.0 pg   MCHC 32.9  30.0 - 36.0 g/dL   RDW 28.4  13.2 - 44.0 %   Platelets 202  150 - 400 K/uL   Neutrophils Relative 50  43 - 77 %   Neutro Abs 4.9  1.7 - 7.7 K/uL   Lymphocytes Relative 32  12 - 46 %   Lymphs Abs 3.2  0.7 - 4.0 K/uL   Monocytes Relative 15  (*) 3 - 12 %   Monocytes Absolute 1.5 (*) 0.1 - 1.0 K/uL   Eosinophils Relative 3  0 - 5 %   Eosinophils Absolute 0.3  0.0 - 0.7 K/uL   Basophils Relative 0  0 - 1 %   Basophils Absolute 0.0  0.0 - 0.1 K/uL  COMPREHENSIVE METABOLIC PANEL     Status: Abnormal   Collection Time    05/18/12  8:30 AM      Result Value Range   Sodium 142  135 - 145 mEq/L   Potassium 4.1  3.5 - 5.1 mEq/L   Chloride 105  96 - 112 mEq/L  CO2 27  19 - 32 mEq/L   Glucose, Bld 151 (*) 70 - 99 mg/dL   BUN 13  6 - 23 mg/dL   Creatinine, Ser 1.61  0.50 - 1.35 mg/dL   Calcium 8.7  8.4 - 09.6 mg/dL   Total Protein 5.9 (*) 6.0 - 8.3 g/dL   Albumin 2.8 (*) 3.5 - 5.2 g/dL   AST 31  0 - 37 U/L   ALT 35  0 - 53 U/L   Alkaline Phosphatase 85  39 - 117 U/L   Total Bilirubin 0.2 (*) 0.3 - 1.2 mg/dL   GFR calc non Af Amer 83 (*) >90 mL/min   GFR calc Af Amer >90  >90 mL/min   Comment:            The eGFR has been calculated     using the CKD EPI equation.     This calculation has not been     validated in all clinical     situations.     eGFR's persistently     <90 mL/min signify     possible Chronic Kidney Disease.     HEENT: normal Cardio: RRR Resp: CTA B/L GI: BS positive and Distention Extremity:  Trace ankle Edema- wearing TEDs again this am. Skin:   Intact Neuro: Alert/Oriented, Normal Sensory, Abnormal Motor 4/5 R Bi, tri , grip otherwise 5/5 and Normal FMC. LE 3+ prox to 4+ distally Musc/Skel:  Normal Gen NAD   Assessment/Plan: 1. Functional deficits secondary to deconditioning after severe bout of C Diff which require 3+ hours per day of interdisciplinary therapy in a comprehensive inpatient rehab setting. Physiatrist is providing close team supervision and 24 hour management of active medical problems listed below. Physiatrist and rehab team continue to assess barriers to discharge/monitor patient progress toward functional and medical goals. FIM: FIM - Bathing Bathing Steps Patient Completed:  Chest;Right Arm;Left Arm;Abdomen;Left upper leg;Right upper leg Bathing: 3: Mod-Patient completes 5-7 60f 10 parts or 50-74%  FIM - Upper Body Dressing/Undressing Upper body dressing/undressing steps patient completed: Thread/unthread right sleeve of pullover shirt/dresss;Thread/unthread left sleeve of pullover shirt/dress;Put head through opening of pull over shirt/dress;Pull shirt over trunk Upper body dressing/undressing: 5: Set-up assist to: Obtain clothing/put away FIM - Lower Body Dressing/Undressing Lower body dressing/undressing steps patient completed: Thread/unthread right pants leg;Thread/unthread left pants leg;Pull pants up/down Lower body dressing/undressing: 4: Min-Patient completed 75 plus % of tasks  FIM - Toileting Toileting steps completed by patient: Adjust clothing prior to toileting;Performs perineal hygiene;Adjust clothing after toileting Toileting Assistive Devices: Grab bar or rail for support Toileting: 5: Supervision: Safety issues/verbal cues  FIM - Diplomatic Services operational officer Devices: Elevated toilet seat;Walker;Grab bars Toilet Transfers: 4-To toilet/BSC: Min A (steadying Pt. > 75%);5-From toilet/BSC: Supervision (verbal cues/safety issues)  FIM - Banker Devices: Therapist, occupational: 5: Sit > Supine: Supervision (verbal cues/safety issues);4: Chair or W/C > Bed: Min A (steadying Pt. > 75%)  FIM - Locomotion: Wheelchair Locomotion: Wheelchair: 1: Total Assistance/staff pushes wheelchair (Pt<25%) FIM - Locomotion: Ambulation Locomotion: Ambulation Assistive Devices: Designer, industrial/product Ambulation/Gait Assistance: 4: Min assist Locomotion: Ambulation: 2: Travels 50 - 149 ft with minimal assistance (Pt.>75%)  Comprehension Comprehension Mode: Auditory Comprehension: 6-Follows complex conversation/direction: With extra time/assistive device  Expression Expression Mode: Verbal Expression:  7-Expresses complex ideas: With no assist  Social Interaction Social Interaction: 6-Interacts appropriately with others with medication or extra time (anti-anxiety, antidepressant).  Problem Solving Problem Solving: 6-Solves complex problems: With  extra time  Memory Memory: 5-Recognizes or recalls 90% of the time/requires cueing < 10% of the time  Medical Problem List and Plan:  1. Deconditioning after prolonged Clostridium difficile. Continue by mouth vancomycin as directed   2. DVT Prophylaxis/Anticoagulation: Subcutaneous Lovenox. Monitor platelet counts and any signs of bleeding  3. Pain Management: Hydrocodone as needed. Monitor with increased mobility  4. Mood/depression. Lexapro 10 mg daily, Valium 10 mg every 8 hours as needed anxiety and Effexor 75 mg daily. Provide emotional support and positive reinforcement  5. Neuropsych: This patient is capable of making decisions on his/her own behalf.  6. Hypokalemia. Follow up chemistries ok. Encourage PO 7. BPH. Hytrin.   8. Decreased nutritional storage. Followup with dietary service 9. Hyperlipidemia. Zocor 10. Florastor to help with stool bulk. Stools normalizing LOS (Days) 5 A FACE TO FACE EVALUATION WAS PERFORMED  SWARTZ,ZACHARY T 05/20/2012, 8:14 AM

## 2012-05-21 ENCOUNTER — Inpatient Hospital Stay (HOSPITAL_COMMUNITY): Payer: Medicare Other | Admitting: Occupational Therapy

## 2012-05-21 ENCOUNTER — Inpatient Hospital Stay (HOSPITAL_COMMUNITY): Payer: Medicare Other

## 2012-05-21 NOTE — Progress Notes (Signed)
Physical Therapy Session Note  Patient Details  Name: Randall Odom MRN: [id number] Date of Birth: February 27, 1927  Today's Date: 05/21/2012 Time: 4098-1191 Time Calculation (min): 28 min  Skilled Therapeutic Interventions/Progress Updates:  Nustep for functional strengthening and endurance x 15 min on level 5. S overall with transfers and functional gait in pt room using RW; cues for safe placement needed intermittently. Returned to bed end of session to rest.  Therapy Documentation Precautions:  Precautions Precautions: Fall;Other (comment) Precaution Comments: c- diff contact Restrictions Weight Bearing Restrictions: No  Pain:  No complaints.  See FIM for current functional status  Therapy/Group: Individual Therapy  [name] [facility] 05/21/2012, 3:59 PM

## 2012-05-21 NOTE — Progress Notes (Signed)
Physical Therapy Session Note  Patient Details  Name: Randall Odom MRN: [id number] Date of Birth: 1926-09-14  Today's Date: 05/21/2012 Time: 0830-0927 Time Calculation (min): 57 min  Skilled Therapeutic Interventions/Progress Updates:    Assisted with toileting needs; close S gait with RW in room for functional mobility. Gait down to therapy gym with steady A; especially during turns with focus on posture, step length, and cadence and cues still needed for RW positioning. Neuro re-ed for balance training including standing PNF diagonals for trunk rotation with red medicine ball x 10 reps x 2 sets each direction; alternating toe taps on step with min A to mod A due to LOB x 10 reps bilaterally x 3 sets. Stair training up/down 5 steps with rails for community mobility and up/down curb step with RW for home entry simulation with overall min A. W/c propulsion for endurance and strengthening of UE's.   Therapy Documentation Precautions:  Precautions Precautions: Fall;Other (comment) Precaution Comments: c- diff contact Restrictions Weight Bearing Restrictions: No  Pain: Reports soreness but overall no complaints  See FIM for current functional status  Therapy/Group: Individual Therapy  [name] [facility] 05/21/2012, 11:21 AM

## 2012-05-21 NOTE — Progress Notes (Signed)
Occupational Therapy Session Notes  Patient Details  Name: SHEMAR PLEMMONS MRN: [id number] Date of Birth: 1926-07-20  Today's Date: 05/21/2012  Short Term Goals: Week 1:  OT Short Term Goal 1 (Week 1): Pt will complete walk-in shower transfer with supervision OT Short Term Goal 2 (Week 1): Pt will complete toilet transfer with supervision with necessary AD OT Short Term Goal 3 (Week 1): Pt will complete LB dressing with min assist using AE PRN OT Short Term Goal 4 (Week 1): Pt will complete 2 grooming tasks in standing with  supervision.  Skilled Therapeutic Interventions/Progress Updates:   Session #1 1030-1130 - 60 Minutes Individual Therapy No complaints of pain Patient found seated in recliner upon entering room. Patient engaged in functional mobility using rolling walker and ambulated into bathroom for toilet transfer in order to doff clothing, then transferred onto tub transfer bench for UB/LB bathing. Patient then sat on toilet seat for donning of clothes. Patient ambulated out of bathroom and stood at sink for brushing hair, sat at sink for shaving . Focused skilled intervention on functional mobility using rolling walker, UB/LB bathing & dressing, sit<>stands, safety with rolling walker, dynamic standing balance/tolerance/endurance, grooming tasks at sink, and overall activity tolerance/endurance. At end of session patient transferred back to bed and therapist left patient supine in bed with call bell & phone within reach.   Session #2 6962-9528 - 45 Minutes Individual Therapy No complaints of pain Patient found supine in bed upon entering room with wife present in room. Discussed family education with wife and wife willing to come next Tuesday 0930-1130 for PT/OT education. Patient donned shoes sitting edge of bed, then ambulated with rolling walker -> w/c seated outside of room. Patient then propelled self -> ADL apartment most of the way. Left w/c seated outside of ADL apartment  and patient engaged in functional ambulation/mobility throughout apartment, kitchen chair transfer, shower stall transfer using shower block and BSC, couch/furniture transfer, and overall safety with rolling walker. Patient sat in w/c after and propelled self -> therapy gym for UE exercises using theraband and w/c pushups. Therapist propelled patient back to room and patient transferred -> recliner with call bell & phone within reach.   Precautions:  Precautions Precautions: Fall;Other (comment) Precaution Comments: c- diff contact Restrictions Weight Bearing Restrictions: No  See FIM for current functional status  CLAY,PATRICIA 05/21/2012, 10:50 AM

## 2012-05-21 NOTE — Progress Notes (Signed)
Patient ID: Randall Odom, male   DOB: 02/21/1927, 79 y.o.   MRN: [id number] Subjective/Complaints: A little sore from therapy. Happy with bowel progress. Review of Systems  Gastrointestinal: Negative for nausea, vomiting, abdominal pain and constipation.  All other systems reviewed and are negative.   Objective: Vital Signs: Blood pressure 135/75, pulse 60, temperature 97.6 F (36.4 C), temperature source Oral, resp. rate 18, height 5\' 10"  (1.778 m), weight 93.713 kg (206 lb 9.6 oz), SpO2 92.00%. No results found. Results for orders placed during the hospital encounter of 05/15/12 (from the past 72 hour(s))  HEMOGLOBIN A1C     Status: Abnormal   Collection Time    05/18/12  8:30 AM      Result Value Range   Hemoglobin A1C 6.0 (*) <5.7 %   Comment: (NOTE)                                                                               According to the ADA Clinical Practice Recommendations for 2011, when     HbA1c is used as a screening test:      >=6.5%   Diagnostic of Diabetes Mellitus               (if abnormal result is confirmed)     5.7-6.4%   Increased risk of developing Diabetes Mellitus     References:Diagnosis and Classification of Diabetes Mellitus,Diabetes     Care,2011,34(Suppl 1):S62-S69 and Standards of Medical Care in             Diabetes - 2011,Diabetes Care,2011,34 (Suppl 1):S11-S61.   Mean Plasma Glucose 126 (*) <117 mg/dL  CBC WITH DIFFERENTIAL     Status: Abnormal   Collection Time    05/18/12  8:30 AM      Result Value Range   WBC 9.9  4.0 - 10.5 K/uL   RBC 3.89 (*) 4.22 - 5.81 MIL/uL   Hemoglobin 12.2 (*) 13.0 - 17.0 g/dL   HCT 46.9 (*) 62.9 - 52.8 %   MCV 95.4  78.0 - 100.0 fL   MCH 31.4  26.0 - 34.0 pg   MCHC 32.9  30.0 - 36.0 g/dL   RDW 41.3  24.4 - 01.0 %   Platelets 202  150 - 400 K/uL   Neutrophils Relative 50  43 - 77 %   Neutro Abs 4.9  1.7 - 7.7 K/uL   Lymphocytes Relative 32  12 - 46 %   Lymphs Abs 3.2  0.7 - 4.0 K/uL   Monocytes Relative 15  (*) 3 - 12 %   Monocytes Absolute 1.5 (*) 0.1 - 1.0 K/uL   Eosinophils Relative 3  0 - 5 %   Eosinophils Absolute 0.3  0.0 - 0.7 K/uL   Basophils Relative 0  0 - 1 %   Basophils Absolute 0.0  0.0 - 0.1 K/uL  COMPREHENSIVE METABOLIC PANEL     Status: Abnormal   Collection Time    05/18/12  8:30 AM      Result Value Range   Sodium 142  135 - 145 mEq/L   Potassium 4.1  3.5 - 5.1 mEq/L   Chloride 105  96 - 112 mEq/L  CO2 27  19 - 32 mEq/L   Glucose, Bld 151 (*) 70 - 99 mg/dL   BUN 13  6 - 23 mg/dL   Creatinine, Ser 4.54  0.50 - 1.35 mg/dL   Calcium 8.7  8.4 - 09.8 mg/dL   Total Protein 5.9 (*) 6.0 - 8.3 g/dL   Albumin 2.8 (*) 3.5 - 5.2 g/dL   AST 31  0 - 37 U/L   ALT 35  0 - 53 U/L   Alkaline Phosphatase 85  39 - 117 U/L   Total Bilirubin 0.2 (*) 0.3 - 1.2 mg/dL   GFR calc non Af Amer 83 (*) >90 mL/min   GFR calc Af Amer >90  >90 mL/min   Comment:            The eGFR has been calculated     using the CKD EPI equation.     This calculation has not been     validated in all clinical     situations.     eGFR's persistently     <90 mL/min signify     possible Chronic Kidney Disease.     HEENT: normal Cardio: RRR Resp: CTA B/L GI: BS positive and Distention Extremity:  Trace ankle Edema- wearing TEDs again this am. Skin:   Intact Neuro: Alert/Oriented, decreased distal sensory function to LT in both legs, Abnormal Motor 4/5 R Bi, tri , grip otherwise 5/5 and Normal FMC. LE 3+ prox to 4+ distally Musc/Skel:  Normal Gen NAD   Assessment/Plan: 1. Functional deficits secondary to deconditioning after severe bout of C Diff which require 3+ hours per day of interdisciplinary therapy in a comprehensive inpatient rehab setting. Physiatrist is providing close team supervision and 24 hour management of active medical problems listed below. Physiatrist and rehab team continue to assess barriers to discharge/monitor patient progress toward functional and medical goals. FIM: FIM -  Bathing Bathing Steps Patient Completed: Chest;Right Arm;Left Arm;Abdomen;Front perineal area;Buttocks;Right upper leg;Left upper leg;Right lower leg (including foot);Left lower leg (including foot) Bathing: 5: Supervision: Safety issues/verbal cues  FIM - Upper Body Dressing/Undressing Upper body dressing/undressing steps patient completed: Thread/unthread right sleeve of pullover shirt/dresss;Thread/unthread left sleeve of pullover shirt/dress;Put head through opening of pull over shirt/dress;Pull shirt over trunk Upper body dressing/undressing: 5: Set-up assist to: Obtain clothing/put away FIM - Lower Body Dressing/Undressing Lower body dressing/undressing steps patient completed: Thread/unthread right underwear leg;Thread/unthread left underwear leg;Pull underwear up/down;Thread/unthread right pants leg;Thread/unthread left pants leg;Pull pants up/down;Don/Doff left sock;Don/Doff right shoe;Fasten/unfasten right shoe;Fasten/unfasten left shoe Lower body dressing/undressing: 4: Min-Patient completed 75 plus % of tasks  FIM - Toileting Toileting steps completed by patient: Adjust clothing prior to toileting;Performs perineal hygiene;Adjust clothing after toileting Toileting Assistive Devices: Grab bar or rail for support Toileting: 5: Supervision: Safety issues/verbal cues  FIM - Diplomatic Services operational officer Devices: Elevated toilet seat;Grab bars;Walker Toilet Transfers: 4-To toilet/BSC: Min A (steadying Pt. > 75%);4-From toilet/BSC: Min A (steadying Pt. > 75%)  FIM - Bed/Chair Transfer Bed/Chair Transfer Assistive Devices: Therapist, occupational: 6: Supine > Sit: No assist;6: Sit > Supine: No assist;4: Bed > Chair or W/C: Min A (steadying Pt. > 75%);5: Chair or W/C > Bed: Supervision (verbal cues/safety issues)  FIM - Locomotion: Wheelchair Locomotion: Wheelchair: 1: Total Assistance/staff pushes wheelchair (Pt<25%) FIM - Locomotion: Ambulation Locomotion: Ambulation  Assistive Devices: Designer, industrial/product Ambulation/Gait Assistance: 4: Min assist Locomotion: Ambulation: 2: Travels 50 - 149 ft with minimal assistance (Pt.>75%)  Comprehension Comprehension Mode: Auditory Comprehension: 5-Follows  basic conversation/direction: With extra time/assistive device  Expression Expression Mode: Verbal Expression: 6-Expresses complex ideas: With extra time/assistive device  Social Interaction Social Interaction: 6-Interacts appropriately with others with medication or extra time (anti-anxiety, antidepressant).  Problem Solving Problem Solving: 5-Solves complex 90% of the time/cues < 10% of the time  Memory Memory: 5-Recognizes or recalls 90% of the time/requires cueing < 10% of the time  Medical Problem List and Plan:  1. Deconditioning after prolonged Clostridium difficile. Continue by mouth vancomycin as directed   2. DVT Prophylaxis/Anticoagulation: Subcutaneous Lovenox. Monitor platelet counts and any signs of bleeding  3. Pain Management: Hydrocodone as needed. Monitor with increased mobility  4. Mood/depression. Lexapro 10 mg daily, Valium 10 mg every 8 hours as needed anxiety and Effexor 75 mg daily. Provide emotional support and positive reinforcement  5. Neuropsych: This patient is capable of making decisions on his/her own behalf.  6. Hypokalemia. Serial chemistries. Encourage PO 7. BPH. Hytrin.   8. Decreased nutritional storage. Improving intake 9. Hyperlipidemia. Zocor 10. Florastor to help with stool bulk. Stools pattern better. LOS (Days) 6 A FACE TO FACE EVALUATION WAS PERFORMED  SWARTZ,ZACHARY T 05/21/2012, 7:58 AM

## 2012-05-22 ENCOUNTER — Inpatient Hospital Stay (HOSPITAL_COMMUNITY): Payer: Medicare Other

## 2012-05-22 ENCOUNTER — Inpatient Hospital Stay (HOSPITAL_COMMUNITY): Payer: Medicare Other | Admitting: Occupational Therapy

## 2012-05-22 DIAGNOSIS — E876 Hypokalemia: Secondary | ICD-10-CM

## 2012-05-22 DIAGNOSIS — A0472 Enterocolitis due to Clostridium difficile, not specified as recurrent: Secondary | ICD-10-CM

## 2012-05-22 DIAGNOSIS — R5381 Other malaise: Secondary | ICD-10-CM

## 2012-05-22 LAB — URINALYSIS, ROUTINE W REFLEX MICROSCOPIC
Bilirubin Urine: NEGATIVE
Glucose, UA: NEGATIVE mg/dL
Hgb urine dipstick: NEGATIVE
Ketones, ur: NEGATIVE mg/dL
pH: 7.5 (ref 5.0–8.0)

## 2012-05-22 NOTE — Progress Notes (Signed)
Physical Therapy Weekly Progress Note  Patient Details  Name: Randall Odom MRN: [id number] Date of Birth: February 24, 1927  Today's Date: 05/22/2012  Session #1 Time: 4098-1191 Time Calculation (min): 56 min No complaints of pain. Close S for functional mobility in room for toileting tasks and transfers in preparation to leave room. Gait with RW down to therapy gym with cues for step length, cadence, and safety with AD with close S but 2 LOB during turns requiring min A to recover. Neuro re-ed for balance training on compliant surface (foam) and then repeated activity on balance zone (for anterior/posterior balance reaction training) while reaching for objects across midline and overhead without UE support; when LOB occurred able to correct with using 1 UE support x 2 sets each side for each surface. Dynamic gait through obstacle course with RW with focus on sidestepping through narrow spaces (Cues for foot positioning) and navigating curb step with RW with steady A. Nustep for endurance and strengthening x 10 min with LE's only on level 5.   Session #2 Time:1300-1330 (30 min) No complaints of pain. Gait with RW on unit down to therapy gym; decreased balance and safety noted requiring min A and min verbal cues; (increased shuffled gait pattern and forward lean onto RW, decreased coordination). Focused rest of session on balance training and coordination with weighted medicine ball for PNF diagonals in standing in both directions; 2 episodes of LOB with min A to recover.   Patient's STG = LTG's due to LOS. Pt is making excellent progress while on CIR, functioning at an overall S to min A level using RW. Pt continues to require cues for safe use of AD and min A for higher level balance activities and gait due to decreased balance strategies and awareness. Family education scheduled for next week before d/c planned for Wed 3/19.  Patient continues to demonstrate the following deficits: gait, balance,  strength, endurance, safety awareness, and therefore will continue to benefit from skilled PT intervention to enhance overall performance with activity tolerance, balance, ability to compensate for deficits and coordination.  Patient progressing toward long term goals..  Continue plan of care.   Skilled Therapeutic Interventions/Progress Updates:  Ambulation/gait training;Balance/vestibular training;DME/adaptive equipment instruction;Functional mobility training;Pain management;Patient/family education;Therapeutic Activities;Therapeutic Exercise;UE/LE Strength taining/ROM;Community reintegration;Discharge planning;Skin care/wound management;Neuromuscular re-education;Splinting/orthotics;Stair training;Psychosocial support;Wheelchair propulsion/positioning;UE/LE Coordination activities   Therapy Documentation Precautions:  Precautions Precautions: Fall;Other (comment) Precaution Comments: c- diff contact Restrictions Weight Bearing Restrictions: No Locomotion : Ambulation Ambulation/Gait Assistance: 4: Min guard   See FIM for current functional status  Therapy/Group: Individual Therapy  [name] [facility] 05/22/2012, 9:30 AM

## 2012-05-22 NOTE — Progress Notes (Signed)
Patient ID: Randall Odom, male   DOB: 1926-07-02, 79 y.o.   MRN: [id number] Subjective/Complaints: No new issues. Denies pain. Review of Systems  Gastrointestinal: Negative for nausea, vomiting, abdominal pain and constipation.  All other systems reviewed and are negative.   Objective: Vital Signs: Blood pressure 126/75, pulse 69, temperature 97.8 F (36.6 C), temperature source Oral, resp. rate 18, height 5\' 10"  (1.778 m), weight 93.713 kg (206 lb 9.6 oz), SpO2 96.00%. No results found. Results for orders placed during the hospital encounter of 05/15/12 (from the past 72 hour(s))  CREATININE, SERUM     Status: Abnormal   Collection Time    05/22/12  6:53 AM      Result Value Range   Creatinine, Ser 0.87  0.50 - 1.35 mg/dL   GFR calc non Af Amer 78 (*) >90 mL/min   GFR calc Af Amer 90 (*) >90 mL/min   Comment:            The eGFR has been calculated     using the CKD EPI equation.     This calculation has not been     validated in all clinical     situations.     eGFR's persistently     <90 mL/min signify     possible Chronic Kidney Disease.     HEENT: normal Cardio: RRR Resp: CTA B/L GI: BS positive and Distention Extremity:  Trace ankle Edema only. Legs non tender Skin:   Intact Neuro: Alert/Oriented, decreased distal sensory function to LT in both legs, Abnormal Motor 4/5 R Bi, tri , grip otherwise 5/5 and Normal FMC. LE 3+to 4/5 prox to 4+ distally Musc/Skel:  Normal Gen NAD Psych: appropriate affect   Assessment/Plan: 1. Functional deficits secondary to deconditioning after severe bout of C Diff which require 3+ hours per day of interdisciplinary therapy in a comprehensive inpatient rehab setting. Physiatrist is providing close team supervision and 24 hour management of active medical problems listed below. Physiatrist and rehab team continue to assess barriers to discharge/monitor patient progress toward functional and medical goals. FIM: FIM - Bathing Bathing  Steps Patient Completed: Chest;Right Arm;Left Arm;Abdomen;Front perineal area;Buttocks;Right upper leg;Left upper leg;Right lower leg (including foot);Left lower leg (including foot) Bathing: 5: Supervision: Safety issues/verbal cues  FIM - Upper Body Dressing/Undressing Upper body dressing/undressing steps patient completed: Thread/unthread right sleeve of pullover shirt/dresss;Thread/unthread left sleeve of pullover shirt/dress;Put head through opening of pull over shirt/dress;Pull shirt over trunk Upper body dressing/undressing: 5: Supervision: Safety issues/verbal cues FIM - Lower Body Dressing/Undressing Lower body dressing/undressing steps patient completed: Thread/unthread right underwear leg;Thread/unthread left underwear leg;Pull underwear up/down;Thread/unthread right pants leg;Thread/unthread left pants leg;Pull pants up/down;Don/Doff right shoe;Don/Doff left shoe;Fasten/unfasten right shoe;Fasten/unfasten left shoe Lower body dressing/undressing: 5: Supervision: Safety issues/verbal cues  FIM - Toileting Toileting steps completed by patient: Adjust clothing prior to toileting;Performs perineal hygiene;Adjust clothing after toileting Toileting Assistive Devices: Grab bar or rail for support Toileting: 0: Activity did not occur  FIM - Diplomatic Services operational officer Devices: Elevated toilet seat;Walker;Grab bars Toilet Transfers: 4-To toilet/BSC: Min A (steadying Pt. > 75%);4-From toilet/BSC: Min A (steadying Pt. > 75%)  FIM - Banker Devices: Walker;Arm rests Bed/Chair Transfer: 6: Supine > Sit: No assist;5: Bed > Chair or W/C: Supervision (verbal cues/safety issues)  FIM - Locomotion: Wheelchair Locomotion: Wheelchair: 2: Travels 50 - 149 ft with supervision, cueing or coaxing FIM - Locomotion: Ambulation Locomotion: Ambulation Assistive Devices: Designer, industrial/product Ambulation/Gait Assistance: 4: Min guard  Locomotion:  Ambulation: 4: Travels 150 ft or more with minimal assistance (Pt.>75%)  Comprehension Comprehension Mode: Auditory Comprehension: 6-Follows complex conversation/direction: With extra time/assistive device  Expression Expression Mode: Verbal Expression: 7-Expresses complex ideas: With no assist  Social Interaction Social Interaction: 6-Interacts appropriately with others with medication or extra time (anti-anxiety, antidepressant).  Problem Solving Problem Solving: 6-Solves complex problems: With extra time  Memory Memory: 5-Recognizes or recalls 90% of the time/requires cueing < 10% of the time  Medical Problem List and Plan:  1. Deconditioning after prolonged Clostridium difficile. Continue by mouth vancomycin as directed   2. DVT Prophylaxis/Anticoagulation: Subcutaneous Lovenox. Monitor platelet counts and any signs of bleeding  3. Pain Management: Hydrocodone as needed. Monitor with increased mobility  4. Mood/depression. Lexapro 10 mg daily, Valium 10 mg every 8 hours as needed anxiety and Effexor 75 mg daily. Provide emotional support and positive reinforcement as necessary. 5. Neuropsych: This patient is capable of making decisions on his/her own behalf.  6. Hypokalemia. Improved. Recheck next week 7. BPH. Hytrin.   8. FEN: po intake excellent 9. Hyperlipidemia. Zocor 10. Florastor to help with stool bulk. Stools are formed now LOS (Days) 7 A FACE TO FACE EVALUATION WAS PERFORMED  SWARTZ,ZACHARY T 05/22/2012, 8:05 AM

## 2012-05-22 NOTE — Progress Notes (Signed)
Occupational Therapy Weekly Progress Note & Session Notes  Patient Details  Name: Randall Odom MRN: [id number] Date of Birth: 1926-11-14  Today's Date: 05/22/2012  Patient has met 4 of 4 short term goals. Patient is making steady progress on CIR. Patient is currently supervision -> steady assist for BADLs, transfers, and functional mobility with use of rolling walker. Patient's long term goals were recently downgraded to an overall supervision level secondary to poor overall safety awareness. Patient continues to require mod verbal cues in order to safely ambulate and transfer with rolling walker. Plan is for patient to discharge 3/19 -> home with wife, wife to come in for family education Tuesday from 0930-1030.   Patient continues to demonstrate the following deficits: decreased overall activity tolerance/endurance, decreased independence with sit<>stands, decreased dynamic standing balance/tolerance/endurance, Therefore, patient will continue to benefit from skilled OT intervention to enhance overall performance with BADL, iADL and Reduce care partner burden.  Patient progressing toward long term goals..  Continue plan of care.  OT Short Term Goals Week 1:  OT Short Term Goal 1 (Week 1): Pt will complete walk-in shower transfer with supervision OT Short Term Goal 1 - Progress (Week 1): Met OT Short Term Goal 2 (Week 1): Pt will complete toilet transfer with supervision with necessary AD OT Short Term Goal 2 - Progress (Week 1): Met OT Short Term Goal 3 (Week 1): Pt will complete LB dressing with min assist using AE PRN OT Short Term Goal 3 - Progress (Week 1): Met OT Short Term Goal 4 (Week 1): Pt will complete 2 grooming tasks in standing with  supervision. OT Short Term Goal 4 - Progress (Week 1): Met  Week 2:  OT Short Term Goal 1 (Week 2): Short Term Goals = Long Term Goals  Skilled Therapeutic Interventions/Progress Updates:  Balance/vestibular training;Community  reintegration;Discharge planning;DME/adaptive equipment instruction;Functional mobility training;Pain management;Patient/family education;Psychosocial support;Self Care/advanced ADL retraining;Skin care/wound managment;Therapeutic Activities;Therapeutic Exercise;UE/LE Strength taining/ROM;UE/LE Coordination activities;Wheelchair propulsion/positioning   Precautions:  Precautions Precautions: Fall;Other (comment) Precaution Comments: c- diff contact Restrictions Weight Bearing Restrictions: No  See FIM for current functional status  ---------------------------------------------------------------------------------------------------------------------------------------  SESSION NOTES  Session #1 1000-1055 - 55 Minutes Individual Therapy No complaints of pain Patient found supine in bed. Patient engaged in bed mobility and sat edge of bed then ambulated -> bathroom using rolling walker. Patient sat on elevated toilet seat in order to doff clothing, then transferred onto tub transfer bench in walk-in shower for UB/LB bathing. Patient then sat on elevated toilet seat for UB/LB dressing. Patient then stood at sink for grooming tasks. Patient sat in recliner for therapist to donn bilateral TED hose and patient to donn bilateral shoes. Patient then engaged in UE exercises seated in recliner using theraband. At end of session left patient seated in recliner with call bell & phone within reach.   Session #2 1420-1500 - 40 Minutes Individual Therapy No complaints of pain Patient found seated in recliner. Patient engaged in functional mobility and performed toilet transfer and toileting with close supervision. Patient then ambulated -> w/c seated outside of room. Therapist then propelled patient -> 1st floor of hospital for focus on functional ambulation on various surfaces, sit<>stands, safety with rolling walker, dynamic standing balance/tolerance/endurance, and discussion regarding d/c planning and  DME. Therapist propelled patient back to room and left seated in recliner with call bell & phone within reach.   CLAY,PATRICIA 05/22/2012, 10:18 AM

## 2012-05-23 ENCOUNTER — Inpatient Hospital Stay (HOSPITAL_COMMUNITY): Payer: Medicare Other | Admitting: Physical Therapy

## 2012-05-23 DIAGNOSIS — E876 Hypokalemia: Secondary | ICD-10-CM

## 2012-05-23 NOTE — Progress Notes (Signed)
Physical Therapy Session Note  Patient Details  Name: Randall Odom MRN: [id number] Date of Birth: 09/24/1926  Today's Date: 05/23/2012 Time: 1500-1530 Time Calculation (min): 30 min  Short Term Goals: Week 2  Skilled Therapeutic Interventions/Progress Updates:    Pt ambulated all the way to ortho gym >250' and to therapy car with close supervision/min-guard assist and cues for safe speed, RW proximity, and maintaining balance during turns. Car transfer performed with close supervision, cues for sequencing and safety with RW. Pt able to ambulate all the way back to room >250' with RW, no rest break with close suprvision, cues for focusing on gait as pt becomes distracted with external stimulus such as people walking by and looses balance. Practiced horizontal head turns and vertical head turns with gait with moderate gait changes noted, also practiced "weaving" RW while keeping feet safely inside RW and keeping RW at safe distance.   Therapy Documentation Precautions:  Precautions Precautions: Fall;Other (comment) Precaution Comments: c- diff contact Restrictions Weight Bearing Restrictions: No Pain: Pain Assessment Pain Assessment: No/denies pain  See FIM for current functional status  Therapy/Group: Individual Therapy  Wilhemina Bonito 05/23/2012, 3:38 PM

## 2012-05-23 NOTE — Progress Notes (Signed)
Patient ID: ORONDE HALLENBECK, male   DOB: 10-May-1926, 79 y.o.   MRN: [id number] Patient ID: AYVIN LIPINSKI, male   DOB: 1926-04-22, 79 y.o.   MRN: [id number] Subjective/Complaints: Objective:  57/106.  79 year old patient with severe deconditioning after her episode of severe C. difficile colitis cocci by dehydration. Stable medical problems include diabetes hypertension and coronary artery disease. Hospital course cough treated by acute renal insufficiency and hypokalemia  BP Readings from Last 3 Encounters:  05/23/12 108/67  05/15/12 115/75  05/09/11 120/71    CBG (last 3)  No results found for this basename: GLUCAP,  in the last 72 hours  General-alert and oriented ENT negative chest clear to auscultation   cardiovascular. Regular no tachycardia Abdomen benign no distention Extremities no edema  Impression-physical deconditioning improving Hypertension stable History acute renal failure. Resolved Status post C. difficile colitis    Vital Signs: Blood pressure 108/67, pulse 67, temperature 97.5 F (36.4 C), temperature source Oral, resp. rate 18, height 5\' 10"  (1.778 m), weight 93.713 kg (206 lb 9.6 oz), SpO2 96.00%. No results found. Results for orders placed during the hospital encounter of 05/15/12 (from the past 72 hour(s))  CREATININE, SERUM     Status: Abnormal   Collection Time    05/22/12  6:53 AM      Result Value Range   Creatinine, Ser 0.87  0.50 - 1.35 mg/dL   GFR calc non Af Amer 78 (*) >90 mL/min   GFR calc Af Amer 90 (*) >90 mL/min   Comment:            The eGFR has been calculated     using the CKD EPI equation.     This calculation has not been     validated in all clinical     situations.     eGFR's persistently     <90 mL/min signify     possible Chronic Kidney Disease.  URINALYSIS, ROUTINE W REFLEX MICROSCOPIC     Status: Abnormal   Collection Time    05/22/12  5:07 PM      Result Value Range   Color, Urine YELLOW  YELLOW   APPearance HAZY (*)  CLEAR   Specific Gravity, Urine 1.019  1.005 - 1.030   pH 7.5  5.0 - 8.0   Glucose, UA NEGATIVE  NEGATIVE mg/dL   Hgb urine dipstick NEGATIVE  NEGATIVE   Bilirubin Urine NEGATIVE  NEGATIVE   Ketones, ur NEGATIVE  NEGATIVE mg/dL   Protein, ur NEGATIVE  NEGATIVE mg/dL   Urobilinogen, UA 0.2  0.0 - 1.0 mg/dL   Nitrite NEGATIVE  NEGATIVE   Leukocytes, UA NEGATIVE  NEGATIVE   Comment: MICROSCOPIC NOT DONE ON URINES WITH NEGATIVE PROTEIN, BLOOD, LEUKOCYTES, NITRITE, OR GLUCOSE <1000 mg/dL.     HEENT: normal Cardio: RRR Resp: CTA B/L GI: BS positive and Distention Extremity:  Trace ankle Edema only. Legs non tender Skin:   Intact Neuro: Alert/Oriented, decreased distal sensory function to LT in both legs, Abnormal Motor 4/5 R Bi, tri , grip otherwise 5/5 and Normal FMC. LE 3+to 4/5 prox to 4+ distally Musc/Skel:  Normal Gen NAD Psych: appropriate affect   Assessment/Plan: 1. Functional deficits secondary to deconditioning after severe bout of C Diff which require 3+ hours per day of interdisciplinary therapy in a comprehensive inpatient rehab setting. Physiatrist is providing close team supervision and 24 hour management of active medical problems listed below. Physiatrist and rehab team continue to assess barriers to discharge/monitor patient progress  toward functional and medical goals. FIM: FIM - Bathing Bathing Steps Patient Completed: Chest;Right Arm;Left Arm;Abdomen;Front perineal area;Buttocks;Right upper leg;Left upper leg;Right lower leg (including foot);Left lower leg (including foot) Bathing: 5: Supervision: Safety issues/verbal cues  FIM - Upper Body Dressing/Undressing Upper body dressing/undressing steps patient completed: Thread/unthread right sleeve of pullover shirt/dresss;Thread/unthread left sleeve of pullover shirt/dress;Put head through opening of pull over shirt/dress;Pull shirt over trunk Upper body dressing/undressing: 5: Set-up assist to: Obtain clothing/put  away FIM - Lower Body Dressing/Undressing Lower body dressing/undressing steps patient completed: Thread/unthread right underwear leg;Thread/unthread left underwear leg;Pull underwear up/down;Thread/unthread right pants leg;Pull pants up/down;Thread/unthread left pants leg;Don/Doff left shoe;Fasten/unfasten right shoe;Fasten/unfasten left shoe;Don/Doff right shoe Lower body dressing/undressing: 5: Supervision: Safety issues/verbal cues  FIM - Toileting Toileting steps completed by patient: Adjust clothing prior to toileting;Performs perineal hygiene;Adjust clothing after toileting Toileting Assistive Devices: Grab bar or rail for support Toileting: 5: Supervision: Safety issues/verbal cues  FIM - Diplomatic Services operational officer Devices: Elevated toilet seat;Walker Toilet Transfers: 5-To toilet/BSC: Supervision (verbal cues/safety issues);5-From toilet/BSC: Supervision (verbal cues/safety issues)  FIM - Banker Devices: Walker;Arm rests Bed/Chair Transfer: 6: Supine > Sit: No assist;5: Bed > Chair or W/C: Supervision (verbal cues/safety issues)  FIM - Locomotion: Wheelchair Locomotion: Wheelchair: 1: Total Assistance/staff pushes wheelchair (Pt<25%) FIM - Locomotion: Ambulation Locomotion: Ambulation Assistive Devices: Designer, industrial/product Ambulation/Gait Assistance: 4: Min guard Locomotion: Ambulation: 4: Travels 150 ft or more with minimal assistance (Pt.>75%)  Comprehension Comprehension Mode: Auditory Comprehension: 5-Understands complex 90% of the time/Cues < 10% of the time  Expression Expression Mode: Verbal Expression: 7-Expresses complex ideas: With no assist  Social Interaction Social Interaction: 6-Interacts appropriately with others with medication or extra time (anti-anxiety, antidepressant).  Problem Solving Problem Solving: 5-Solves basic problems: With no assist  Memory Memory: 5-Recognizes or recalls 90% of the  time/requires cueing < 10% of the time  Medical Problem List and Plan:  1. Deconditioning after prolonged Clostridium difficile. Continue by mouth vancomycin as directed   2. DVT Prophylaxis/Anticoagulation: Subcutaneous Lovenox. Monitor platelet counts and any signs of bleeding  3. Pain Management: Hydrocodone as needed. Monitor with increased mobility  4. Mood/depression. Lexapro 10 mg daily, Valium 10 mg every 8 hours as needed anxiety and Effexor 75 mg daily. Provide emotional support and positive reinforcement as necessary. 5. Neuropsych: This patient is capable of making decisions on his/her own behalf.  6. Hypokalemia. Improved. Recheck next week 7. BPH. Hytrin.   8. FEN: po intake excellent 9. Hyperlipidemia. Zocor 10. Florastor to help with stool bulk. Stools are formed now LOS (Days) 8 A FACE TO FACE EVALUATION WAS PERFORMED  Rogelia Boga 05/23/2012, 9:09 AM

## 2012-05-23 NOTE — Progress Notes (Signed)
Physical Therapy Session Note  Patient Details  Name: Randall Odom MRN: [id number] Date of Birth: 03-Aug-1926  Today's Date: 05/23/2012 Time: 4098-1191 Time Calculation (min): 48 min  Short Term Goals: Week 2:  PT Short Term Goal 1 (Week 2): STG's = LTG's  Skilled Therapeutic Interventions/Progress Updates:    This session focused on bed mobility mod I with HOB 20 degrees and railing.  Sit to stand from bed supervision with verbal cues for safe hand placement.  Pt having increased back pain today and reports not feeling as well as yesterday. Back pain 7/10 RN made aware.  Gait to and from gym with RW > 150' each way min guard assist.  Verbal cues to remember his RW safety while turning.   Biodex for balance working on postural control and limits of stability.  Nu Step legs only x 10 mins level 5 working on endurance and leg strength.    Therapy Documentation Precautions:  Precautions Precautions: Fall;Other (comment) Precaution Comments: c- diff contact Restrictions Weight Bearing Restrictions: No General:     Pain: Pain Assessment Pain Assessment: No/denies pain Pain Score:  ("better")  See FIM for current functional status  Therapy/Group: Individual Therapy  Lurena Joiner B. Debralee Braaksma, PT, DPT [phone]   05/23/2012, 4:02 PM

## 2012-05-24 ENCOUNTER — Inpatient Hospital Stay (HOSPITAL_COMMUNITY): Payer: Medicare Other

## 2012-05-24 NOTE — Progress Notes (Signed)
Physical Therapy Session Note  Patient Details  Name: JOUSHUA DUGAR MRN: [id number] Date of Birth: 23-Sep-1926  Today's Date: 05/24/2012 Time: 4098-1191 Time Calculation (min): 60 min  Short Term Goals: Week 2:  PT Short Term Goal 1 (Week 2): STG's = LTG's  Skilled Therapeutic Interventions/Progress Updates:    Functional mobility in room using RW for transfer OOB and on/off toilet with close S for safety in preparation for leaving room for therapy. Gait on unit to/from therapy > 150' with close S/steady A; cues still needed for safety with RW especially during turns (tends to lose balance and get outside of RW). Functional strengthening and neuro re-ed for balance on [place] in standing x 1 min reps with rest breaks between sets x 3; balance activity in standing to maintain plates level while taking 1 arm off and placing horseshoes on basketball hoop using both R and L UEs; without UE support LOB requiring assist from UEs to catch himself. Nustep for endurance and strengthening x 10 min on level 5 LE's only.   Therapy Documentation Precautions:  Precautions Precautions: Fall;Other (comment) Precaution Comments: c- diff contact Restrictions Weight Bearing Restrictions: No  Pain: Denies pain. Reports having to use bathroom more often (urinate) with burning sensation. Notified RN.  See FIM for current functional status  Therapy/Group: Individual Therapy  [name] [facility] 05/24/2012, 8:57 AM

## 2012-05-24 NOTE — Progress Notes (Signed)
Patient ID: Randall Odom, male   DOB: 10/26/1926, 79 y.o.   MRN: [id number] Patient ID: Randall Odom, male   DOB: [birth year], 79 y.o.   MRN: [id number] Patient ID: Randall Odom, male   DOB: June 30, 1926, 79 y.o.   MRN: [id number] Subjective/Complaints: Objective:  47/65.  79 year old patient with severe deconditioning after an  episode of severe C. difficile colitis complicated by dehydration. Stable medical problems include diabetes hypertension and coronary artery disease. Hospital course also complicated by acute renal insufficiency and hypokalemia. Doing well today without concerns or complaints  BP Readings from Last 3 Encounters:  05/24/12 124/71  05/15/12 115/75  05/09/11 120/71    CBG (last 3)  No results found for this basename: GLUCAP,  in the last 72 hours  General-alert and oriented ENT negative chest clear to auscultation   cardiovascular. Regular no tachycardia Abdomen benign no distention Extremities no edema  Impression-physical deconditioning improving; continue Lovenox for DVT prophylaxis Hypertension stable History acute renal failure. Resolved Status post C. difficile colitis    Vital Signs: Blood pressure 124/71, pulse 67, temperature 98.4 F (36.9 C), temperature source Oral, resp. rate 18, height 5\' 10"  (1.778 m), weight 93.713 kg (206 lb 9.6 oz), SpO2 94.00%. No results found. Results for orders placed during the hospital encounter of 05/15/12 (from the past 72 hour(s))  CREATININE, SERUM     Status: Abnormal   Collection Time    05/22/12  6:53 AM      Result Value Range   Creatinine, Ser 0.87  0.50 - 1.35 mg/dL   GFR calc non Af Amer 78 (*) >90 mL/min   GFR calc Af Amer 90 (*) >90 mL/min   Comment:            The eGFR has been calculated     using the CKD EPI equation.     This calculation has not been     validated in all clinical     situations.     eGFR's persistently     <90 mL/min signify     possible Chronic Kidney Disease.  URINALYSIS,  ROUTINE W REFLEX MICROSCOPIC     Status: Abnormal   Collection Time    05/22/12  5:07 PM      Result Value Range   Color, Urine YELLOW  YELLOW   APPearance HAZY (*) CLEAR   Specific Gravity, Urine 1.019  1.005 - 1.030   pH 7.5  5.0 - 8.0   Glucose, UA NEGATIVE  NEGATIVE mg/dL   Hgb urine dipstick NEGATIVE  NEGATIVE   Bilirubin Urine NEGATIVE  NEGATIVE   Ketones, ur NEGATIVE  NEGATIVE mg/dL   Protein, ur NEGATIVE  NEGATIVE mg/dL   Urobilinogen, UA 0.2  0.0 - 1.0 mg/dL   Nitrite NEGATIVE  NEGATIVE   Leukocytes, UA NEGATIVE  NEGATIVE   Comment: MICROSCOPIC NOT DONE ON URINES WITH NEGATIVE PROTEIN, BLOOD, LEUKOCYTES, NITRITE, OR GLUCOSE <1000 mg/dL.  URINE CULTURE     Status: None   Collection Time    05/22/12  5:07 PM      Result Value Range   Specimen Description URINE, CLEAN CATCH     Special Requests NONE     Culture  Setup Time 05/22/2012 17:44     Colony Count PENDING     Culture Culture reincubated for better growth     Report Status PENDING       HEENT: normal Cardio: RRR Resp: CTA B/L GI: BS positive and Distention Extremity:  Trace  ankle Edema only. Legs non tender Skin:   Intact Neuro: Alert/Oriented, decreased distal sensory function to LT in both legs, Abnormal Motor 4/5 R Bi, tri , grip otherwise 5/5 and Normal FMC. LE 3+to 4/5 prox to 4+ distally Musc/Skel:  Normal Gen NAD Psych: appropriate affect   Assessment/Plan: 1. Functional deficits secondary to deconditioning after severe bout of C Diff which require 3+ hours per day of interdisciplinary therapy in a comprehensive inpatient rehab setting. Physiatrist is providing close team supervision and 24 hour management of active medical problems listed below. Physiatrist and rehab team continue to assess barriers to discharge/monitor patient progress toward functional and medical goals. FIM: FIM - Bathing Bathing Steps Patient Completed: Chest;Right Arm;Left Arm;Abdomen;Front perineal area;Buttocks;Right upper  leg;Left upper leg;Right lower leg (including foot);Left lower leg (including foot) Bathing: 5: Supervision: Safety issues/verbal cues  FIM - Upper Body Dressing/Undressing Upper body dressing/undressing steps patient completed: Thread/unthread right sleeve of pullover shirt/dresss;Thread/unthread left sleeve of pullover shirt/dress;Put head through opening of pull over shirt/dress;Pull shirt over trunk Upper body dressing/undressing: 5: Set-up assist to: Obtain clothing/put away FIM - Lower Body Dressing/Undressing Lower body dressing/undressing steps patient completed: Thread/unthread right underwear leg;Thread/unthread left underwear leg;Pull underwear up/down;Thread/unthread right pants leg;Pull pants up/down;Thread/unthread left pants leg;Don/Doff left shoe;Fasten/unfasten right shoe;Fasten/unfasten left shoe;Don/Doff right shoe Lower body dressing/undressing: 5: Supervision: Safety issues/verbal cues  FIM - Toileting Toileting steps completed by patient: Adjust clothing prior to toileting;Performs perineal hygiene;Adjust clothing after toileting Toileting Assistive Devices: Grab bar or rail for support Toileting: 5: Supervision: Safety issues/verbal cues  FIM - Diplomatic Services operational officer Devices: Elevated toilet seat;Walker Toilet Transfers: 4-To toilet/BSC: Min A (steadying Pt. > 75%);4-From toilet/BSC: Min A (steadying Pt. > 75%)  FIM - Banker Devices: Walker;Bed rails;Arm rests Bed/Chair Transfer: 6: Supine > Sit: No assist;5: Bed > Chair or W/C: Supervision (verbal cues/safety issues);5: Chair or W/C > Bed: Supervision (verbal cues/safety issues)  FIM - Locomotion: Wheelchair Locomotion: Wheelchair: 0: Activity did not occur FIM - Locomotion: Ambulation Locomotion: Ambulation Assistive Devices: Designer, industrial/product Ambulation/Gait Assistance: 4: Min guard Locomotion: Ambulation: 4: Travels 150 ft or more with minimal  assistance (Pt.>75%)  Comprehension Comprehension Mode: Auditory Comprehension: 5-Understands complex 90% of the time/Cues < 10% of the time  Expression Expression Mode: Verbal Expression: 7-Expresses complex ideas: With no assist  Social Interaction Social Interaction: 6-Interacts appropriately with others with medication or extra time (anti-anxiety, antidepressant).  Problem Solving Problem Solving: 5-Solves complex 90% of the time/cues < 10% of the time  Memory Memory: 6-More than reasonable amt of time [facility])  Medical Problem List and Plan:  1. Deconditioning after prolonged Clostridium difficile. Continue by mouth vancomycin as directed   2. DVT Prophylaxis/Anticoagulation: Subcutaneous Lovenox. Monitor platelet counts and any signs of bleeding  3. Pain Management: Hydrocodone as needed. Monitor with increased mobility  4. Mood/depression. Lexapro 10 mg daily, Valium 10 mg every 8 hours as needed anxiety and Effexor 75 mg daily. Provide emotional support and positive reinforcement as necessary. 5. Neuropsych: This patient is capable of making decisions on his/her own behalf.  6. Hypokalemia. Improved. Recheck next week 7. BPH. Hytrin.   8. FEN: po intake excellent 9. Hyperlipidemia. Zocor 10. Florastor to help with stool bulk. Stools are formed now LOS (Days) 9 A FACE TO FACE EVALUATION WAS PERFORMED  Rogelia Boga 05/24/2012, 8:43 AM

## 2012-05-25 ENCOUNTER — Inpatient Hospital Stay (HOSPITAL_COMMUNITY): Payer: Medicare Other

## 2012-05-25 ENCOUNTER — Inpatient Hospital Stay (HOSPITAL_COMMUNITY): Payer: Medicare Other | Admitting: Occupational Therapy

## 2012-05-25 LAB — URINE CULTURE: Colony Count: >=100000

## 2012-05-25 NOTE — Progress Notes (Signed)
Physical Therapy Session Note  Patient Details  Name: Randall Odom MRN: [id number] Date of Birth: 1927/01/27  Today's Date: 05/25/2012 Time: 1530-1600 Time Calculation (min): 30 min  Skilled Therapeutic Interventions/Progress Updates:   With initial transfer OOB and gait in room with RW, pt presents with decreased safety awareness and max cues needed to use RW properly. When asked about it, pt attributed it to just waking up from a nap. Steady A gait to/from therapy gym on unit and on carpeted surface for home environment simulation; cues for posture and slow down cadence (increased shuffling and festinating gait noted) for safety. Nustep for endurance and strengthening to aid with overall mobility x 10 min on level 5 with LE's only.  Therapy Documentation Precautions:  Precautions Precautions: Fall;Other (comment) Precaution Comments: c- diff contact Restrictions Weight Bearing Restrictions: No   Pain: No complaints  See FIM for current functional status  Therapy/Group: Individual Therapy  [name] [facility] 05/25/2012, 4:06 PM

## 2012-05-25 NOTE — Progress Notes (Signed)
Occupational Therapy Session Notes  Patient Details  Name: Randall Odom MRN: [id number] Date of Birth: 10-17-1926  Today's Date: 05/25/2012  Short Term Goals: Week 1:  OT Short Term Goal 1 (Week 1): Pt will complete walk-in shower transfer with supervision OT Short Term Goal 1 - Progress (Week 1): Met OT Short Term Goal 2 (Week 1): Pt will complete toilet transfer with supervision with necessary AD OT Short Term Goal 2 - Progress (Week 1): Met OT Short Term Goal 3 (Week 1): Pt will complete LB dressing with min assist using AE PRN OT Short Term Goal 3 - Progress (Week 1): Met OT Short Term Goal 4 (Week 1): Pt will complete 2 grooming tasks in standing with  supervision. OT Short Term Goal 4 - Progress (Week 1): Met  Week 2:  OT Short Term Goal 1 (Week 2): Short Term Goals = Long Term Goals  Skilled Therapeutic Interventions/Progress Updates:   Session #1 [phone] - 70 Minutes Individual Therapy No complaints of pain Patient found seated in recliner upon entering room. Patient engaged in functional ambulation/mobility using rolling walker to gather necessary items for ADL, then patient ambulated into bathroom for toilet transfer in order to doff clothes, then patient performed walk-in shower transfer onto tub transfer bench. Patient performed UB/LB bathing at shower level, then donned clothes in sit<>stand position from elevated toilet seat. Patient continues to demonstrate poor safety awareness with rolling walker during functional tasks, patient requires total verbal cues in order to remember not to carry items in hands while ambulating with rolling walker; patient does have walker bag donned onto walker. After shower, patient sat at sink in w/c for grooming tasks. Therapist donned bilateral TED hose and at end of session left patient seated in recliner with call bell & phone within reach.   Session #2 [id number] - 40 Minutes Individual Therapy No complaints of pain. Patient found  supine in bed. Patient engaged in bed mobility and ambulated in room -> wash hands standing at sink -> w/c set-up outside of room. Patient then propelled self from room -> therapy gym for therapeutic exercise focusing on dynamic standing balance/tolerance/endurance & UE strengthening. Patient then ambulated from therapy gym -> ADL apartment for focus on functional ambulation/mobility using rolling walker, gathering item out of refrigerator, safety with rolling walker, simulated walk-in shower stall transfer, and furniture transfers on/off couch. Patient then ambulated back to gym and performed 3 sets of 10 w/c pushups. Therapist propelled patient back to room and left seated in recliner with call bell & phone within reach.   Precautions:  Precautions Precautions: Fall;Other (comment) Precaution Comments: c- diff contact Restrictions Weight Bearing Restrictions: No  See FIM for current functional status  [name] 05/25/2012, 7:34 AM

## 2012-05-25 NOTE — Progress Notes (Signed)
Physical Therapy Session Note  Patient Details  Name: Randall Odom MRN: [id number] Date of Birth: 1926/09/13  Today's Date: 05/25/2012 Time: 0825-0920 Time Calculation (min): 55 min  Short Term Goals: Week 2:  PT Short Term Goal 1 (Week 2): STG's = LTG's  Skilled Therapeutic Interventions/Progress Updates:    Mod I with bed mobility; S gait into bathroom to complete toileting with RW and hygiene at sink. Gait with RW on unit with close S/steady A needed intermittently for LOB; cues for posture and positioning of RW. Dynamic gait through obstacle course with close S practicing turns and stepping over objects. Neuro re-ed for balance training on compliant surface with focus on reaching outside BOS to place horseshoes overhead on basketball hoop - close S and 1 episode of LOB with max A to recover needed; another LOB when stepping backwards off of compliant surface with uncontrolled descent onto the mat. Practiced floor transfers and reviewed what to do in case of emergency; initially min A with transfer and second time with S; pt able to verbalized what to do in case of emergency. Stair training with rails for community mobility training with close S. Gait back to room with close S; transferred into recliner with LE's elevated.   Therapy Documentation Precautions:  Precautions Precautions: Fall;Other (comment) Precaution Comments: c- diff contact Restrictions Weight Bearing Restrictions: No  Pain:   See FIM for current functional status  Therapy/Group: Individual Therapy  [name] [facility] 05/25/2012, 9:26 AM

## 2012-05-25 NOTE — Progress Notes (Signed)
Patient ID: Randall Odom, male   DOB: 06-13-1926, 79 y.o.   MRN: [id number] Subjective/Complaints: Complains of urinary frequency and dysuria. Stools formed Review of Systems     Objective: Vital Signs: Blood pressure 108/49, pulse 65, temperature 98 F (36.7 C), temperature source Oral, resp. rate 18, height 5\' 10"  (1.778 m), weight 93.713 kg (206 lb 9.6 oz), SpO2 93.00%. No results found. Results for orders placed during the hospital encounter of 05/15/12 (from the past 72 hour(s))  URINALYSIS, ROUTINE W REFLEX MICROSCOPIC     Status: Abnormal   Collection Time    05/22/12  5:07 PM      Result Value Range   Color, Urine YELLOW  YELLOW   APPearance HAZY (*) CLEAR   Specific Gravity, Urine 1.019  1.005 - 1.030   pH 7.5  5.0 - 8.0   Glucose, UA NEGATIVE  NEGATIVE mg/dL   Hgb urine dipstick NEGATIVE  NEGATIVE   Bilirubin Urine NEGATIVE  NEGATIVE   Ketones, ur NEGATIVE  NEGATIVE mg/dL   Protein, ur NEGATIVE  NEGATIVE mg/dL   Urobilinogen, UA 0.2  0.0 - 1.0 mg/dL   Nitrite NEGATIVE  NEGATIVE   Leukocytes, UA NEGATIVE  NEGATIVE   Comment: MICROSCOPIC NOT DONE ON URINES WITH NEGATIVE PROTEIN, BLOOD, LEUKOCYTES, NITRITE, OR GLUCOSE <1000 mg/dL.  URINE CULTURE     Status: None   Collection Time    05/22/12  5:07 PM      Result Value Range   Specimen Description URINE, CLEAN CATCH     Special Requests NONE     Culture  Setup Time 05/22/2012 17:44     Colony Count >=100,000 COLONIES/ML     Culture       Value: Multiple bacterial morphotypes present, none predominant. Suggest appropriate recollection if clinically indicated.   Report Status 05/25/2012 FINAL       HEENT: normal Cardio: RRR Resp: CTA B/L GI: BS positive and Distention Extremity:  Trace ankle Edema only. Legs non tender Skin:   Intact Neuro: Alert/Oriented, decreased distal sensory function to LT in both legs, Abnormal Motor 4/5 R Bi, tri , grip otherwise 5/5 and Normal FMC. LE 3+to 4/5 prox to 4+  distally Musc/Skel:  Normal Gen NAD Psych: appropriate affect   Assessment/Plan: 1. Functional deficits secondary to deconditioning after severe bout of C Diff which require 3+ hours per day of interdisciplinary therapy in a comprehensive inpatient rehab setting. Physiatrist is providing close team supervision and 24 hour management of active medical problems listed below. Physiatrist and rehab team continue to assess barriers to discharge/monitor patient progress toward functional and medical goals. FIM: FIM - Bathing Bathing Steps Patient Completed: Chest;Right Arm;Left Arm;Abdomen;Front perineal area;Buttocks;Right upper leg;Left upper leg;Right lower leg (including foot);Left lower leg (including foot) Bathing: 5: Supervision: Safety issues/verbal cues  FIM - Upper Body Dressing/Undressing Upper body dressing/undressing steps patient completed: Thread/unthread right sleeve of pullover shirt/dresss;Thread/unthread left sleeve of pullover shirt/dress;Put head through opening of pull over shirt/dress;Pull shirt over trunk Upper body dressing/undressing: 5: Set-up assist to: Obtain clothing/put away FIM - Lower Body Dressing/Undressing Lower body dressing/undressing steps patient completed: Thread/unthread right underwear leg;Thread/unthread left underwear leg;Pull underwear up/down;Thread/unthread right pants leg;Pull pants up/down;Thread/unthread left pants leg;Don/Doff left shoe;Fasten/unfasten right shoe;Fasten/unfasten left shoe;Don/Doff right shoe Lower body dressing/undressing: 5: Supervision: Safety issues/verbal cues  FIM - Toileting Toileting steps completed by patient: Adjust clothing prior to toileting;Performs perineal hygiene;Adjust clothing after toileting Toileting Assistive Devices: Grab bar or rail for support Toileting: 5: Supervision: Safety issues/verbal  cues  FIM - Diplomatic Services operational officer Devices: Grab bars;Walker Toilet Transfers: 4-To  toilet/BSC: Min A (steadying Pt. > 75%);4-From toilet/BSC: Min A (steadying Pt. > 75%)  FIM - Banker Devices: Walker;Arm rests Bed/Chair Transfer: 4: Bed > Chair or W/C: Min A (steadying Pt. > 75%);4: Chair or W/C > Bed: Min A (steadying Pt. > 75%)  FIM - Locomotion: Wheelchair Locomotion: Wheelchair: 0: Activity did not occur FIM - Locomotion: Ambulation Locomotion: Ambulation Assistive Devices: Designer, industrial/product Ambulation/Gait Assistance: 4: Min guard Locomotion: Ambulation: 4: Travels 150 ft or more with minimal assistance (Pt.>75%)  Comprehension Comprehension Mode: Auditory Comprehension: 5-Understands complex 90% of the time/Cues < 10% of the time  Expression Expression Mode: Verbal Expression: 7-Expresses complex ideas: With no assist  Social Interaction Social Interaction: 6-Interacts appropriately with others with medication or extra time (anti-anxiety, antidepressant).  Problem Solving Problem Solving: 5-Solves complex 90% of the time/cues < 10% of the time  Memory Memory: 6-More than reasonable amt of time [facility])  Medical Problem List and Plan:  1. Deconditioning after prolonged Clostridium difficile. Continue by mouth vancomycin as directed   2. DVT Prophylaxis/Anticoagulation: Subcutaneous Lovenox. Monitor platelet counts and any signs of bleeding  3. Pain Management: Hydrocodone as needed. Monitor with increased mobility  4. Mood/depression. Lexapro 10 mg daily, Valium 10 mg every 8 hours as needed anxiety and Effexor 75 mg daily. Provide emotional support and positive reinforcement as necessary. 5. Neuropsych: This patient is capable of making decisions on his/her own behalf.  6. Hypokalemia. Improved. Recheck next week 7. BPH. Hytrin.    -recheck urine for culture  -check pvr's 8. FEN: po intake excellent 9. Hyperlipidemia. Zocor 10. Florastor to help with stool bulk. Stools are formed now LOS (Days) 10 A FACE TO FACE  EVALUATION WAS PERFORMED  Yaffa Seckman T 05/25/2012, 8:13 AM

## 2012-05-26 ENCOUNTER — Inpatient Hospital Stay (HOSPITAL_COMMUNITY): Payer: Medicare Other | Admitting: Occupational Therapy

## 2012-05-26 ENCOUNTER — Inpatient Hospital Stay (HOSPITAL_COMMUNITY): Payer: Medicare Other

## 2012-05-26 ENCOUNTER — Inpatient Hospital Stay (HOSPITAL_COMMUNITY): Payer: Medicare Other | Admitting: Physical Therapy

## 2012-05-26 LAB — URINALYSIS, ROUTINE W REFLEX MICROSCOPIC
Hgb urine dipstick: NEGATIVE
Nitrite: NEGATIVE
Protein, ur: NEGATIVE mg/dL
Urobilinogen, UA: 0.2 mg/dL (ref 0.0–1.0)

## 2012-05-26 NOTE — Progress Notes (Signed)
Occupational Therapy Session Notes & Discharge Summary  Patient Details  Name: Randall Odom MRN: [id number] Date of Birth: 08/07/1926  Today's Date: 05/26/2012  SESSION NOTES  Session #1 0930-1030 - 60 Minutes Individual Therapy No complaints of pain Patient found supine in bed with family present in room. Patient engaged in bed mobility for functional ambulation throughout room using rolling walker. Patient then engaged in toilet transfer to doff pants, shower transfer for UB/LB bathing at shower level. Patient's family was present for education and verbalized & demonstrated understanding of patient's needs. Patient able to perform BADL at mod I level secondary to controlled environment that patient has practiced multiple times while on CIR. Recommending 24/7 supervision at discharge, family aware of recommendation. After ADL patient propelled self -> ADL apartment for education -> family regarding walk-in shower stall transfer using simulated blue block and seat. Patient left seated in w/c in therapy gym for next therapy session with PT.   Session #2 7829-5621 - 23 Minutes Individual Therapy No complaints of pain Patient found seated in recliner. Patient requested to stay in room. Engaged in therapeutic exercise focusing on UE exercise using theraband.   ------------------------------------------------------------------------------------------------------------------------  DISCHARGE SUMMARY Patient has met 8 of 8 long term goals due to improved activity tolerance, improved balance, postural control, ability to compensate for deficits, improved attention, improved awareness and improved coordination.  Patient to discharge at overall supervision->mod I level.  Patient's care partner is independent to provide the necessary supervision assistance at discharge.  Patient is able to perform BADLs at a modified level in a structured/controlled environment. Throughout room, during am ADL session,  patient was able to complete basic tasks at mod I level. Recommending patient have 24/7 supervision at home secondary to different environment than what patient has been practicing in.   Reasons goals not met: n/a, all goals met at this time.  Recommendation:  Patient will benefit from ongoing skilled OT services in home health setting to continue to advance functional skills in the area of BADL and iADL.  Equipment: No equipment provided, patient states he has a BSC and shower chair.  Reasons for discharge: treatment goals met and discharge from hospital  Patient/family agrees with progress made and goals achieved: Yes  Precautions/Restrictions  Precautions Precautions: Fall Precaution Comments: patient continues to remain on C-Diff contact precautions Restrictions Weight Bearing Restrictions: No  Vital Signs Therapy Vitals Temp: 98 F (36.7 C) Pulse Rate: 60 Resp: 18 BP: 125/40 mmHg Oxygen Therapy SpO2: 90 %  ADL - See FIM  Vision/Perception  Vision - History Baseline Vision: Wears glasses all the time Patient Visual Report: No change from baseline Vision - Assessment Eye Alignment: Within Functional Limits Ocular Range of Motion: Within Functional Limits Perception Perception: Within Functional Limits Praxis Praxis: Intact   Cognition Overall Cognitive Status: Appears within functional limits for tasks assessed Arousal/Alertness: Awake/alert Orientation Level: Oriented X4 Memory: Impaired Memory Impairment: Decreased short term memory Decreased Short Term Memory: Functional basic;Verbal basic Awareness: Impaired Problem Solving: Appears intact Safety/Judgment: Impaired  Sensation Sensation Additional Comments: bilateral UEs appear intact Coordination Gross Motor Movements are Fluid and Coordinated: Yes Fine Motor Movements are Fluid and Coordinated: Yes  Motor - See Discharge Navigator  Mobility  - See Discharge Navigator  Trunk/Postural  Assessment  - See Discharge Navigator  Balance - See Discharge Navigator  Extremity/Trunk Assessment RUE Assessment RUE Assessment: Within Functional Limits LUE Assessment LUE Assessment: Within Functional Limits  See FIM for current functional status  Chelle Cayton 05/26/2012, 9:55  AM

## 2012-05-26 NOTE — Progress Notes (Signed)
Patient ID: Randall Odom, male   DOB: 01-Jun-1926, 79 y.o.   MRN: [id number] Subjective/Complaints: Had indigestion and an episode of vomiting yesterday. Feels fine this am. Review of Systems     Objective: Vital Signs: Blood pressure 125/40, pulse 60, temperature 98 F (36.7 C), temperature source Oral, resp. rate 18, height 5\' 10"  (1.778 m), weight 93.713 kg (206 lb 9.6 oz), SpO2 90.00%. No results found. Results for orders placed during the hospital encounter of 05/15/12 (from the past 72 hour(s))  URINALYSIS, ROUTINE W REFLEX MICROSCOPIC     Status: None   Collection Time    05/26/12  6:23 AM      Result Value Range   Color, Urine YELLOW  YELLOW   APPearance CLEAR  CLEAR   Specific Gravity, Urine 1.026  1.005 - 1.030   pH 5.0  5.0 - 8.0   Glucose, UA NEGATIVE  NEGATIVE mg/dL   Hgb urine dipstick NEGATIVE  NEGATIVE   Bilirubin Urine NEGATIVE  NEGATIVE   Ketones, ur NEGATIVE  NEGATIVE mg/dL   Protein, ur NEGATIVE  NEGATIVE mg/dL   Urobilinogen, UA 0.2  0.0 - 1.0 mg/dL   Nitrite NEGATIVE  NEGATIVE   Leukocytes, UA NEGATIVE  NEGATIVE   Comment: MICROSCOPIC NOT DONE ON URINES WITH NEGATIVE PROTEIN, BLOOD, LEUKOCYTES, NITRITE, OR GLUCOSE <1000 mg/dL.     HEENT: normal Cardio: RRR Resp: CTA B/L GI: BS positive and Distention Extremity:  Trace ankle Edema only. Legs non tender Skin:   Intact Neuro: Alert/Oriented, decreased distal sensory function to LT in both legs, Abnormal Motor 4/5 R Bi, tri , grip otherwise 5/5 and Normal FMC. LE 3+to 4/5 prox to 4+ distally Musc/Skel:  Normal Gen NAD Psych: appropriate affect   Assessment/Plan: 1. Functional deficits secondary to deconditioning after severe bout of C Diff which require 3+ hours per day of interdisciplinary therapy in a comprehensive inpatient rehab setting. Physiatrist is providing close team supervision and 24 hour management of active medical problems listed below. Physiatrist and rehab team continue to assess  barriers to discharge/monitor patient progress toward functional and medical goals. FIM: FIM - Bathing Bathing Steps Patient Completed: Chest;Right Arm;Left Arm;Abdomen;Front perineal area;Buttocks;Right upper leg;Left upper leg;Right lower leg (including foot);Left lower leg (including foot) Bathing: 5: Supervision: Safety issues/verbal cues  FIM - Upper Body Dressing/Undressing Upper body dressing/undressing steps patient completed: Thread/unthread right sleeve of pullover shirt/dresss;Thread/unthread left sleeve of pullover shirt/dress;Put head through opening of pull over shirt/dress;Pull shirt over trunk Upper body dressing/undressing: 5: Supervision: Safety issues/verbal cues FIM - Lower Body Dressing/Undressing Lower body dressing/undressing steps patient completed: Thread/unthread right underwear leg;Pull underwear up/down;Thread/unthread left underwear leg;Thread/unthread right pants leg;Thread/unthread left pants leg;Pull pants up/down;Don/Doff right shoe;Don/Doff left shoe;Fasten/unfasten right shoe;Fasten/unfasten left shoe Lower body dressing/undressing: 5: Supervision: Safety issues/verbal cues  FIM - Toileting Toileting steps completed by patient: Performs perineal hygiene;Adjust clothing after toileting;Adjust clothing prior to toileting Toileting Assistive Devices: Grab bar or rail for support Toileting: 5: Supervision: Safety issues/verbal cues  FIM - Diplomatic Services operational officer Devices: Elevated toilet seat;Walker;Grab bars Toilet Transfers: 5-To toilet/BSC: Supervision (verbal cues/safety issues);5-From toilet/BSC: Supervision (verbal cues/safety issues)  FIM - Banker Devices: Walker;Arm rests Bed/Chair Transfer: 6: Supine > Sit: No assist;5: Bed > Chair or W/C: Supervision (verbal cues/safety issues)  FIM - Locomotion: Wheelchair Locomotion: Wheelchair: 0: Activity did not occur FIM - Locomotion:  Ambulation Locomotion: Ambulation Assistive Devices: Designer, industrial/product Ambulation/Gait Assistance: 4: Min guard Locomotion: Ambulation: 4: Travels 150 ft  or more with minimal assistance (Pt.>75%)  Comprehension Comprehension Mode: Auditory Comprehension: 5-Follows basic conversation/direction: With no assist  Expression Expression Mode: Verbal Expression: 7-Expresses complex ideas: With no assist  Social Interaction Social Interaction: 7-Interacts appropriately with others - No medications needed.  Problem Solving Problem Solving: 5-Solves complex 90% of the time/cues < 10% of the time  Memory Memory: 5-Recognizes or recalls 90% of the time/requires cueing < 10% of the time  Medical Problem List and Plan:  1. Deconditioning after prolonged Clostridium difficile. Continue by mouth vancomycin as directed   2. DVT Prophylaxis/Anticoagulation: Subcutaneous Lovenox. Monitor platelet counts and any signs of bleeding  3. Pain Management: Hydrocodone as needed. Monitor with increased mobility  4. Mood/depression. Lexapro 10 mg daily, Valium 10 mg every 8 hours as needed anxiety and Effexor 75 mg daily. Provide emotional support and positive reinforcement as necessary. 5. Neuropsych: This patient is capable of making decisions on his/her own behalf.  6. Hypokalemia. Improved. Recheck next week 7. BPH. Hytrin.    -recheck urine for culture, pending  -check pvr's (NOT DONE) 8. FEN: po intake excellent 9. Hyperlipidemia. Zocor 10. Florastor to help with stool bulk. Stools are formed now LOS (Days) 11 A FACE TO FACE EVALUATION WAS PERFORMED  SWARTZ,ZACHARY T 05/26/2012, 8:56 AM

## 2012-05-26 NOTE — Progress Notes (Signed)
Physical Therapy Discharge Summary  Patient Details  Name: Randall Odom MRN: [id number] Date of Birth: 12-Oct-1926  Today's Date: 05/26/2012 Time: 8413-2440 Time Calculation (min): 55 min Individual therapy; Treatment session focused on family education with pt's family (wife and son) in regards to basic transfers, safety with RW during gait, balance and transfers, floor transfers (close S/steady A) and what to do in an emergency, stair negotiation with rails and then with RW, [employer],  and simulated car transfers. Pt's family able to provide appropriate cueing and educated on pt's fluctuating cognitive status. Pt at overall S to mod I level for mobility; recommending S at home for decreased balance and decreased awareness of pt to deficits.    Patient has met 8 of 8 long term goals due to improved activity tolerance, improved balance, increased strength, decreased pain and improved coordination.  Patient to discharge at an ambulatory level Supervision using RW.   Patient's family is independent to provide the necessary cognitive and supervision assistance at discharge and successfully completed family education.  Reasons goals not met: n/a  Recommendation:  Patient will benefit from ongoing skilled PT services in home health setting to continue to advance safe functional mobility, address ongoing impairments in gait, balance, safety awareness, endurance, muscular endurance, and minimize fall risk.  Equipment: No equipment provided - pt already owns RW and w/c.  Reasons for discharge: treatment goals met and discharge from hospital  Patient/family agrees with progress made and goals achieved: Yes  PT Discharge Precautions/Restrictions Precautions Precautions: Fall Precaution Comments: patient continues to remain on C-Diff contact precautions Restrictions Weight Bearing Restrictions: No Pain Pain Assessment Pain Assessment: No/denies pain Pain Score: 0-No pain Faces Pain Scale:  No hurt Vision/Perception  Vision - History Baseline Vision: Wears glasses all the time Patient Visual Report: No change from baseline Vision - Assessment Eye Alignment: Within Functional Limits Ocular Range of Motion: Within Functional Limits Perception Perception: Within Functional Limits Praxis Praxis: Intact  Cognition Overall Cognitive Status: Impaired Arousal/Alertness: Awake/alert Orientation Level: Oriented X4 Memory: Impaired Memory Impairment: Decreased short term memory Decreased Short Term Memory: Functional basic Awareness: Impaired Awareness Impairment: Anticipatory impairment Problem Solving: Appears intact Safety/Judgment: Impaired Comments: pt continues to require cues for safe mobility (reminders to use the RW for gait and balance), and still decresed safety awareness to LOB. Cognition seems to fluctuate with fatigue level as well. Family well educated and demonstrated understanding of ways to cue pt Sensation Sensation Light Touch: Appears Intact Additional Comments: bilateral UEs appear intact Coordination Gross Motor Movements are Fluid and Coordinated: Yes Fine Motor Movements are Fluid and Coordinated: Yes Motor  Motor Motor: Within Functional Limits    Locomotion  Ambulation Ambulation/Gait Assistance: 5: Supervision Gait Gait Pattern: Shuffle;Festinating;Decreased step length - right;Decreased step length - left;Step-through pattern Stairs / Additional Locomotion Curb: 5: Supervision (with RW)  Trunk/Postural Assessment  Cervical Assessment Cervical Assessment: Within Functional Limits Thoracic Assessment Thoracic Assessment: Within Functional Limits (flexed posture) Lumbar Assessment Lumbar Assessment: Within Functional Limits  Balance Static Sitting Balance Static Sitting - Level of Assistance: 6: Modified independent (Device/Increase time) Dynamic Sitting Balance Dynamic Sitting - Level of Assistance: 6: Modified independent  (Device/Increase time) Static Standing Balance Static Standing - Level of Assistance: 6: Modified independent (Device/Increase time) Dynamic Standing Balance Dynamic Standing - Level of Assistance: 5: Stand by assistance Extremity Assessment  RUE Assessment RUE Assessment: Within Functional Limits LUE Assessment LUE Assessment: Within Functional Limits RLE Assessment RLE Assessment: Within Functional Limits (decreased muscular endurance) LLE  Assessment LLE Assessment: Within Functional Limits (decreased muscular endurance)  See FIM for current functional status  [name] [facility] 05/26/2012, 12:03 PM

## 2012-05-26 NOTE — Progress Notes (Signed)
Physical Therapy Session Note  Patient Details  Name: Randall Odom MRN: [id number] Date of Birth: [birth year]  Today's Date: 05/26/2012 Time: 5784-6962 and 9528-4132 Time Calculation (min): 23 min and 33 min  Short Term Goals: Week 2:  PT Short Term Goal 1 (Week 2): STG's = LTG's  Skilled Therapeutic Interventions/Progress Updates:   Patient to D/C home tomorrow.  Patient performed standing dynamic balance and LE strengthening exercises with bilat <> unilateral UE support on sink during 12 reps each: heel raises, heel raises with unilateral UE shoulder flexion, DF toe raises, hip flexion marches, hip flexion marches with contralateral UE shoulder flexion for increased core activation, hip ABD, hip extension, with verbal and visual cues for correct technique and intermittent rest breaks secondary to back fatigue.  Second session with focus on higher level gait/community challenges with RW: Performed x 75' stepping over low obstacle, weaving L and R around cones, stepping up/down curb, over compliant surface and 180 turn around obstacles x 2 reps with min A overall secondary to decreased safety with RW; required frequent verbal, visual cues to maintain safe distance to RW, maintain feet within RW while changing direction and to be closer to RW before picking it up and placing it over obstacle or on curb.  Patient with poor carryover and required verbal cues each time to perform safely.  Also performed higher level gait and balance training with RW during R and L lateral stepping and retro stepping with supervision-min A for balance and verbal cues for safety and sequencing.  Patient very fatigued at end of session and requested to return to recliner in room.    Therapy Documentation Precautions:  Precautions Precautions: Fall Precaution Comments: patient continues to remain on C-Diff contact precautions Restrictions Weight Bearing Restrictions: No Pain:  Patient reporting chronic back pain  6-7/10.  RN notified for pain meds  See FIM for current functional status  Therapy/Group: Individual Therapy  Edman Circle [facility] 05/26/2012, 3:20 PM

## 2012-05-26 NOTE — Discharge Summary (Signed)
  Discharge summary job [id number]

## 2012-05-26 NOTE — Discharge Summary (Signed)
NAME:  Randall Odom, Randall Odom                     ACCOUNT NO.:  MEDICAL RECORD NO.:  [id number]  LOCATION:                                 FACILITY:  PHYSICIAN:  Ranelle Oyster, M.D.DATE OF BIRTH:  1926/04/06  DATE OF ADMISSION:  05/14/2012 DATE OF DISCHARGE:  05/27/2012                              DISCHARGE SUMMARY   DISCHARGE DIAGNOSES: 1. Deconditioning after prolonged clostridium difficile. 2. Subcutaneous Lovenox for deep vein thrombosis prophylaxis. 3. Pain management. 4. Depression. 5. Hypokalemia resolved. 6. Benign prostatic hypertrophy. 7. Hyperlipidemia.  HISTORY OF PRESENT ILLNESS:  This 79 year old right-handed male independent and active up to approximately 3 weeks prior.  Presented May 12, 2012, to the emergency department with progressive nonwatery diarrhea that initially started 5 days prior to admission with associated decreased appetite, generalized weakness with a dull abdominal pain.  The patient reports a 3-week history of decline, seen by Gastroenterology and ordered stool studies as an outpatient, but not sure which specific labs.  Noted creatinine 2.21, potassium 2.4. Clostridium difficile specimen is May 12, 2012, positive placed on Flagyl, changed to vancomycin.  Subcutaneous Lovenox for DVT prophylaxis.  Renal function improved with gentle IV fluids with supplement of potassium.  Physical and occupational therapy ongoing for deconditioning.  The patient was admitted for comprehensive rehab program.  PAST MEDICAL HISTORY:  See discharge diagnoses.  SOCIAL HISTORY:  He lives with spouse.  FUNCTIONAL HISTORY:  Prior to admission was independent.  FUNCTIONAL STATUS:  Upon admission to rehab services was modified independent for rolling in the bed, needing assistance sit to stand.  PHYSICAL EXAMINATION:  VITAL SIGNS:  Blood pressure 99/56, pulse 68, temperature 97, respirations 19. GENERAL:  This was an alert male, oriented x3.  He did appear  fatigued. LUNGS:  Clear to auscultation. CARDIAC:  Rate controlled. ABDOMEN:  Soft, nontender.  Good bowel sounds.  No distention.  REHABILITATION HOSPITAL COURSE:  The patient was admitted to inpatient rehab services with therapies initiated on a 3-hour daily basis consisting of physical therapy, occupational therapy, and rehabilitation nursing.  The following issues were addressed during the patient's rehabilitation stay.  Pertaining to Mr. Manes' deconditioning after prolonged Clostridium difficile, he had completed his course of vancomycin.  Bowel program was regulated.  He had no nausea or vomiting. He continued on Florastor 500 mg b.i.d.  Subcutaneous Lovenox ongoing for DVT prophylaxis.  Pain management with the use of hydrocodone which he was using on very limited basis.  He did have a history of depression, thus he continued on  Lexapro daily and Valium as needed with emotional support provided.  Hypokalemia resolved with supplement.  Latest potassium level 4.1.  He did have a history of benign prostatic hypertrophy.  He remained on Hytrin.  Postvoid residuals were good.  Latest urine culture multi VAC.  The patient asymptomatic.  The patient received weekly collaborative interdisciplinary team conferences to discuss estimated length of stay, family teaching, and any barriers to his discharge.  He was continent of bowel and bladder.  Overall, minimal assist for activities of daily living and functional mobility.  His strength and endurance continued to improve.  The patient was encouraged  with overall progress.  Plan is to be discharged home with wife after family teaching completed.  Ongoing therapies dictated as per rehab services.  DISCHARGE MEDICATIONS: 1. Aspirin 81 mg daily. 2. Valium 10 mg p.o. every 8 hours as needed. 3. Lexapro 10 mg p.o. daily. 4. Flonase 2 sprays daily. 5. Multivitamin daily. 6. Hydrocodone 1-2 tablets every 4 hours as needed for pain,  dispense     of 60 tablets. 7. Florastor 500 mg b.i.d. 8. Zocor 40 mg p.o. daily. 9. Hytrin 5 mg p.o. at bedtime.   DIET:  The patient was on a regular diet.  SPECIAL INSTRUCTIONS:  The patient followed Dr. Felipa Eth Medical Management to call for appointment, Dr. Faith Rogue, the outpatient rehab service office as needed.     Mariam Dollar, P.A.   ______________________________ Ranelle Oyster, M.D.    DA/MEDQ  D:  05/26/2012  T:  05/26/2012  Job:  [id number]  cc:   Ranelle Oyster, M.D. Larina Earthly, M.D.

## 2012-05-27 DIAGNOSIS — A0472 Enterocolitis due to Clostridium difficile, not specified as recurrent: Secondary | ICD-10-CM

## 2012-05-27 DIAGNOSIS — E876 Hypokalemia: Secondary | ICD-10-CM

## 2012-05-27 DIAGNOSIS — R5381 Other malaise: Secondary | ICD-10-CM

## 2012-05-27 LAB — URINE CULTURE
Colony Count: NO GROWTH
Culture: NO GROWTH

## 2012-05-27 MED ORDER — SACCHAROMYCES BOULARDII 250 MG PO CAPS: 250.0000 mg | ORAL_CAPSULE | Freq: Two times a day (BID) | ORAL | Status: DC

## 2012-05-27 MED ORDER — ESCITALOPRAM OXALATE 10 MG PO TABS: 10.0000 mg | ORAL_TABLET | Freq: Every day | ORAL | Status: DC

## 2012-05-27 MED ORDER — VENLAFAXINE HCL ER 75 MG PO CP24: 75.0000 mg | ORAL_CAPSULE | Freq: Every day | ORAL | Status: DC

## 2012-05-27 MED ORDER — FLUTICASONE PROPIONATE 50 MCG/ACT NA SUSP: 2.0000 | Freq: Every day | NASAL | Status: DC | PRN

## 2012-05-27 MED ORDER — ASPIRIN EC 81 MG PO TBEC: 81.0000 mg | DELAYED_RELEASE_TABLET | Freq: Every day | ORAL | Status: DC

## 2012-05-27 MED ORDER — HYDROCODONE-ACETAMINOPHEN 5-325 MG PO TABS: 1.0000 | ORAL_TABLET | ORAL | Status: DC | PRN

## 2012-05-27 MED ORDER — ADULT MULTIVITAMIN W/MINERALS CH: 1.0000 | ORAL_TABLET | Freq: Every day | ORAL | Status: DC

## 2012-05-27 MED ORDER — TERAZOSIN HCL 5 MG PO CAPS: 5.0000 mg | ORAL_CAPSULE | Freq: Every day | ORAL | Status: AC

## 2012-05-27 MED ORDER — DIAZEPAM 10 MG PO TABS: 10.0000 mg | ORAL_TABLET | Freq: Four times a day (QID) | ORAL | Status: DC | PRN

## 2012-05-27 MED ORDER — SIMVASTATIN 40 MG PO TABS: 40.0000 mg | ORAL_TABLET | Freq: Every day | ORAL | Status: DC

## 2012-05-27 NOTE — Progress Notes (Signed)
Social Work  Discharge Note  The overall goal for the admission was met for:   Discharge location: Yes - home with wife  Length of Stay: Yes - 12 days  Discharge activity level: Yes - modified independent  Home/community participation: Yes  Services provided included: MD, RD, PT, OT, RN, TR, Pharmacy and SW  Financial Services: Medicare and Private Insurance: BCBS  Follow-up services arranged: Home Health: PT, OT via [facility] and Patient/Family has no preference for HH/DME agencies  Comments (or additional information):  Patient/Family verbalized understanding of follow-up arrangements: Yes  Individual responsible for coordination of the follow-up plan: patient  Confirmed correct DME delivered:  NA (already had needed DME)  Randall Odom

## 2012-05-27 NOTE — Progress Notes (Signed)
Patient ID: Randall Odom, male   DOB: [birth year], 79 y.o.   MRN: [id number] Subjective/Complaints: Feels that vancomycin makes him sick on stomach Review of Systems     Objective: Vital Signs: Blood pressure 131/57, pulse 69, temperature 98.3 F (36.8 C), temperature source Oral, resp. rate 20, height 5\' 10"  (1.778 m), weight 93.713 kg (206 lb 9.6 oz), SpO2 92.00%. No results found. Results for orders placed during the hospital encounter of 05/15/12 (from the past 72 hour(s))  URINE CULTURE     Status: None   Collection Time    05/26/12  6:23 AM      Result Value Range   Specimen Description URINE, CLEAN CATCH     Special Requests NONE     Culture  Setup Time 05/26/2012 06:39     Colony Count NO GROWTH     Culture NO GROWTH     Report Status 05/27/2012 FINAL    URINALYSIS, ROUTINE W REFLEX MICROSCOPIC     Status: None   Collection Time    05/26/12  6:23 AM      Result Value Range   Color, Urine YELLOW  YELLOW   APPearance CLEAR  CLEAR   Specific Gravity, Urine 1.026  1.005 - 1.030   pH 5.0  5.0 - 8.0   Glucose, UA NEGATIVE  NEGATIVE mg/dL   Hgb urine dipstick NEGATIVE  NEGATIVE   Bilirubin Urine NEGATIVE  NEGATIVE   Ketones, ur NEGATIVE  NEGATIVE mg/dL   Protein, ur NEGATIVE  NEGATIVE mg/dL   Urobilinogen, UA 0.2  0.0 - 1.0 mg/dL   Nitrite NEGATIVE  NEGATIVE   Leukocytes, UA NEGATIVE  NEGATIVE   Comment: MICROSCOPIC NOT DONE ON URINES WITH NEGATIVE PROTEIN, BLOOD, LEUKOCYTES, NITRITE, OR GLUCOSE <1000 mg/dL.     HEENT: normal Cardio: RRR Resp: CTA B/L GI: BS positive and Distention Extremity:  Trace ankle Edema only. Legs non tender Skin:   Intact Neuro: Alert/Oriented, decreased distal sensory function to LT in both legs, Abnormal Motor 4/5 R Bi, tri , grip otherwise 5/5 and Normal FMC. LE 3+to 4/5 prox to 4+ distally Musc/Skel:  Normal Gen NAD Psych: appropriate affect   Assessment/Plan: 1. Functional deficits secondary to deconditioning after severe bout of C  Diff which require 3+ hours per day of interdisciplinary therapy in a comprehensive inpatient rehab setting. Physiatrist is providing close team supervision and 24 hour management of active medical problems listed below. Physiatrist and rehab team continue to assess barriers to discharge/monitor patient progress toward functional and medical goals.  Set for dc today. Goals met.   FIM: FIM - Bathing Bathing Steps Patient Completed: Chest;Right Arm;Left Arm;Abdomen;Front perineal area;Buttocks;Right upper leg;Left upper leg;Right lower leg (including foot);Left lower leg (including foot) Bathing: 6: Assistive device (Comment)  FIM - Upper Body Dressing/Undressing Upper body dressing/undressing steps patient completed: Thread/unthread right sleeve of pullover shirt/dresss;Thread/unthread left sleeve of pullover shirt/dress;Put head through opening of pull over shirt/dress;Pull shirt over trunk Upper body dressing/undressing: 7: Complete Independence: No helper FIM - Lower Body Dressing/Undressing Lower body dressing/undressing steps patient completed: Thread/unthread right underwear leg;Thread/unthread left underwear leg;Pull underwear up/down;Thread/unthread right pants leg;Thread/unthread left pants leg;Pull pants up/down;Don/Doff right shoe;Don/Doff left shoe;Fasten/unfasten right shoe;Fasten/unfasten left shoe Lower body dressing/undressing: 6: Assistive device (Comment)  FIM - Toileting Toileting steps completed by patient: Adjust clothing prior to toileting;Performs perineal hygiene;Adjust clothing after toileting Toileting Assistive Devices: Grab bar or rail for support Toileting: 6: Assistive device: No helper  FIM - Diplomatic Services operational officer Devices:  Elevated toilet seat;Walker;Grab bars Toilet Transfers: 6-Assistive device: No helper  FIM - Banker Devices: Walker;Arm rests Bed/Chair Transfer: 6: Supine > Sit: No assist;6:  Sit > Supine: No assist;5: Bed > Chair or W/C: Supervision (verbal cues/safety issues);5: Chair or W/C > Bed: Supervision (verbal cues/safety issues)  FIM - Locomotion: Wheelchair Locomotion: Wheelchair: 0: Activity did not occur FIM - Locomotion: Ambulation Locomotion: Ambulation Assistive Devices: Designer, industrial/product Ambulation/Gait Assistance: 5: Supervision Locomotion: Ambulation: 5: Travels 150 ft or more with supervision/safety issues  Comprehension Comprehension Mode: Auditory Comprehension: 5-Follows basic conversation/direction: With no assist  Expression Expression Mode: Verbal Expression: 7-Expresses complex ideas: With no assist  Social Interaction Social Interaction: 7-Interacts appropriately with others - No medications needed.  Problem Solving Problem Solving: 5-Solves basic 90% of the time/requires cueing < 10% of the time  Memory Memory: 5-Recognizes or recalls 90% of the time/requires cueing < 10% of the time  Medical Problem List and Plan:  1. Deconditioning after prolonged Clostridium difficile. Has had 2 weeks of vanc alone after 3/5. Probably do not need to continue beyond discharge. 2. DVT Prophylaxis/Anticoagulation: Subcutaneous Lovenox. Monitor platelet counts and any signs of bleeding  3. Pain Management: Hydrocodone as needed. Monitor with increased mobility  4. Mood/depression. Lexapro 10 mg daily, Valium 10 mg every 8 hours as needed anxiety and Effexor 75 mg daily. Provide emotional support and positive reinforcement as necessary. 5. Neuropsych: This patient is capable of making decisions on his/her own behalf.  6. Hypokalemia. Improved. Recheck next week 7. BPH. Hytrin.    -recheck urine culture negative  -check pvr's (NOT DONE) 8. FEN: po intake excellent 9. Hyperlipidemia. Zocor 10. Florastor to help with stool bulk. Stools are formed now LOS (Days) 12 A FACE TO FACE EVALUATION WAS PERFORMED  SWARTZ,ZACHARY T 05/27/2012, 8:59 AM

## 2012-05-27 NOTE — Patient Care Conference (Signed)
Inpatient RehabilitationTeam Conference and Plan of Care Update Date: 05/26/2012   Time: 2:10 PM    Patient Name: Randall Odom      Medical Record Number: [id number]  Date of Birth: 06/26/1926 Sex: Male         Room/Bed: 4010/4010-01 Payor Info: Payor: MEDICARE  Plan: MEDICARE PART A AND B  Product Type: *No Product type*     Admitting Diagnosis: DECONDITIONED  Admit Date/Time:  05/15/2012  3:24 PM Admission Comments: No comment available   Primary Diagnosis:  Physical deconditioning Principal Problem: Physical deconditioning  Patient Active Problem List   Diagnosis Date Noted  . Physical deconditioning 05/18/2012  . C. difficile colitis 05/13/2012  . Dehydration 05/13/2012  . Diarrhea 05/12/2012  . Acute renal failure 05/12/2012  . Hypokalemia 05/12/2012  . Syncope and collapse 05/06/2011  . Diabetes mellitus 05/06/2011  . Pacemaker 05/06/2011  . HTN (hypertension) 05/06/2011  . CAD (coronary artery disease) 05/06/2011    Expected Discharge Date: Expected Discharge Date: 05/27/12  Team Members Present: Physician leading conference: Dr. Faith Rogue Social Worker Present: Amada Jupiter, LCSW Nurse Present: Daryll Brod, RN PT Present: [name], PT OT Present: Mackie Pai, OT;Patricia Mat Carne, OT Other (Discipline and Name): [name], PPS Coordinator     Current Status/Progress Goal Weekly Team Focus  Medical   nausea yesterday. stools at baseline. eating well  see prior  prepare for dc home   Bowel/Bladder   Continent of bowel and bladder. LBM 05/25/12  Pt to remain continent of bowel and bladder  Monitor   Swallow/Nutrition/ Hydration             ADL's   mod I in controlled environment, supervision for discharge  supervision -> mod I  D/C planning, family education   Mobility   S transfers, steady A/S gait with RW  S overall for transfers, gait, and balance; mod I bed mobility and sitting balance  endurance/activity tolerance, gait, dynamic balance,  strengthening   Communication             Safety/Cognition/ Behavioral Observations            Pain   Vicodin x 2tabs q 4hrs prn  <3  Offer pain medication 1 hr prior to initial therapy session   Skin   L knee abrasion, healing  No additional skin breakdown  Routine turn q 2hrs    Rehab Goals Patient on target to meet rehab goals: Yes *See Interdisciplinary Assessment and Plan and progress notes for long and short-term goals  Barriers to Discharge: see prior    Possible Resolutions to Barriers:  see prior    Discharge Planning/Teaching Needs:  Home with wife to provide 24/7 supervision      Team Discussion:  Has met goals and made good gains with overall endurance.  Ready for d/c tomorrow.  Revisions to Treatment Plan:  None   Continued Need for Acute Rehabilitation Level of Care: The patient requires daily medical management by a physician with specialized training in physical medicine and rehabilitation for the following conditions: Daily direction of a multidisciplinary physical rehabilitation program to ensure safe treatment while eliciting the highest outcome that is of practical value to the patient.: Yes Daily medical management of patient stability for increased activity during participation in an intensive rehabilitation regime.: Yes Daily analysis of laboratory values and/or radiology reports with any subsequent need for medication adjustment of medical intervention for : Other (gi)  Randall Odom 05/27/2012, 10:36 AM

## 2012-05-27 NOTE — Progress Notes (Signed)
Pt discharged home with wife, Dan Finland PA provided discharge instructions and prescriptions, pt taken to private vehicle by wheelchair, pt and wife verbalized an understanding and denied any questions or concerns

## 2012-05-29 DIAGNOSIS — E119 Type 2 diabetes mellitus without complications: Secondary | ICD-10-CM | POA: Diagnosis not present

## 2012-05-29 DIAGNOSIS — R262 Difficulty in walking, not elsewhere classified: Secondary | ICD-10-CM | POA: Diagnosis not present

## 2012-05-29 DIAGNOSIS — A0472 Enterocolitis due to Clostridium difficile, not specified as recurrent: Secondary | ICD-10-CM | POA: Diagnosis not present

## 2012-05-29 DIAGNOSIS — I10 Essential (primary) hypertension: Secondary | ICD-10-CM | POA: Diagnosis not present

## 2012-05-29 DIAGNOSIS — Z5189 Encounter for other specified aftercare: Secondary | ICD-10-CM | POA: Diagnosis not present

## 2012-06-01 DIAGNOSIS — A0472 Enterocolitis due to Clostridium difficile, not specified as recurrent: Secondary | ICD-10-CM | POA: Diagnosis not present

## 2012-06-01 DIAGNOSIS — E1139 Type 2 diabetes mellitus with other diabetic ophthalmic complication: Secondary | ICD-10-CM | POA: Diagnosis not present

## 2012-06-01 DIAGNOSIS — R197 Diarrhea, unspecified: Secondary | ICD-10-CM | POA: Diagnosis not present

## 2012-06-02 ENCOUNTER — Ambulatory Visit (INDEPENDENT_AMBULATORY_CARE_PROVIDER_SITE_OTHER): Payer: Medicare Other | Admitting: Vascular Surgery

## 2012-06-02 ENCOUNTER — Encounter: Payer: Self-pay | Admitting: Vascular Surgery

## 2012-06-02 ENCOUNTER — Other Ambulatory Visit (INDEPENDENT_AMBULATORY_CARE_PROVIDER_SITE_OTHER): Payer: Medicare Other | Admitting: *Deleted

## 2012-06-02 ENCOUNTER — Telehealth: Payer: Self-pay

## 2012-06-02 DIAGNOSIS — E119 Type 2 diabetes mellitus without complications: Secondary | ICD-10-CM | POA: Diagnosis not present

## 2012-06-02 DIAGNOSIS — I6529 Occlusion and stenosis of unspecified carotid artery: Secondary | ICD-10-CM

## 2012-06-02 DIAGNOSIS — Z5189 Encounter for other specified aftercare: Secondary | ICD-10-CM | POA: Diagnosis not present

## 2012-06-02 DIAGNOSIS — I10 Essential (primary) hypertension: Secondary | ICD-10-CM | POA: Diagnosis not present

## 2012-06-02 DIAGNOSIS — A0472 Enterocolitis due to Clostridium difficile, not specified as recurrent: Secondary | ICD-10-CM | POA: Diagnosis not present

## 2012-06-02 DIAGNOSIS — R262 Difficulty in walking, not elsewhere classified: Secondary | ICD-10-CM | POA: Diagnosis not present

## 2012-06-02 NOTE — Progress Notes (Signed)
Subjective:     Patient ID: Randall Odom, male   DOB: 07-02-1926, 79 y.o.   MRN: [id number]  HPIthis 79 year old male previously evaluated by me while in the hospital 05/08/2011 for a near syncopal episode and some dizziness and instability. It was felt that this might be due to carotid occlusive disease. Patient also has had a pacemaker for many years. His carotid disease was not severe with only moderate right ICA stenosis and known left ICA flow reduction at that time. Since then he has had no syncopal episodes or severe dizziness. He does have unstable and unsteady gait. He has no history of stroke. Denies amaurosis fugax diplopia or syncope. Has been having some significant dyspnea on exertion recently.  Past Medical History  Diagnosis Date  . Hypertension   . Muscle spasm   . Diabetes mellitus   . Enlarged prostate   . CAD (coronary artery disease)   . Pacemaker     History  Substance Use Topics  . Smoking status: Former Smoker    Quit date: 06/02/1973  . Smokeless tobacco: Never Used  . Alcohol Use: No    Family History  Problem Relation Age of Onset  . Cancer Mother     Not on File  Current outpatient prescriptions:aspirin EC 81 MG tablet, Take 1 tablet (81 mg total) by mouth daily., Disp: , Rfl: ;  Cholecalciferol (VITAMIN D) 2000 UNITS tablet, Take 2,000 Units by mouth daily., Disp: , Rfl: ;  fluticasone (FLONASE) 50 MCG/ACT nasal spray, Place 2 sprays into the nose daily as needed for allergies., Disp: , Rfl: ;  Multiple Vitamin (MULTIVITAMIN WITH MINERALS) TABS, Take 1 tablet by mouth daily., Disp: , Rfl:  saccharomyces boulardii (FLORASTOR) 250 MG capsule, Take 1 capsule (250 mg total) by mouth 2 (two) times daily., Disp: 60 capsule, Rfl: 0;  simvastatin (ZOCOR) 40 MG tablet, Take 1 tablet (40 mg total) by mouth at bedtime., Disp: 30 tablet, Rfl: 1;  terazosin (HYTRIN) 5 MG capsule, Take 1 capsule (5 mg total) by mouth at bedtime., Disp: 30 capsule, Rfl: 1 venlafaxine XR  (EFFEXOR-XR) 75 MG 24 hr capsule, Take 1 capsule (75 mg total) by mouth daily., Disp: 30 capsule, Rfl: 1;  diazepam (VALIUM) 10 MG tablet, Take 1 tablet (10 mg total) by mouth every 6 (six) hours as needed. anxiety, Disp: 30 tablet, Rfl: 0;  escitalopram (LEXAPRO) 10 MG tablet, Take 1 tablet (10 mg total) by mouth daily., Disp: 30 tablet, Rfl: 1 HYDROcodone-acetaminophen (NORCO/VICODIN) 5-325 MG per tablet, Take 1-2 tablets by mouth every 4 (four) hours as needed., Disp: 60 tablet, Rfl: 0  BP 132/72  Pulse 72  Resp 20  Ht 5\' 10"  (1.778 m)  Wt 195 lb (88.451 kg)  BMI 27.98 kg/m2  Body mass index is 27.98 kg/(m^2).           Review of SystemsDenies chest pain but does complain of dyspnea on exertion and instability with ambulation     Objective:   Physical Exam blood pressure 130/72 heart rate 72 respirations 20 Gen.-alert and oriented x3 in no apparent distress HEENT normal for age Lungs no rhonchi or wheezing Cardiovascular regular rhythm no murmurs carotid pulses 3+ palpable no bruits audible Abdomen soft nontender no palpable masses Musculoskeletal free of  major deformities Skin clear -no rashes Neurologic normal Lower extremities 3+ femoral and dorsalis pedis pulses palpable bilaterally with no edema  Today I ordered a carotid duplex exam which I reviewed and interpreted. There is very mild  internal carotid flow reduction bilaterally-less than 40% with antegrade flow in both vertebral arteries      Assessment:     No significant extracranial occlusive disease and carotid or vertebral arteries     Plan:     No further vascular followup indicated at this time- We'll see him when necessary basis

## 2012-06-02 NOTE — Telephone Encounter (Signed)
Junious Dresser with [facility] called to get extended orders for patients occupational therapy.  Verbal given to extend care.

## 2012-06-04 DIAGNOSIS — E119 Type 2 diabetes mellitus without complications: Secondary | ICD-10-CM | POA: Diagnosis not present

## 2012-06-04 DIAGNOSIS — Z5189 Encounter for other specified aftercare: Secondary | ICD-10-CM | POA: Diagnosis not present

## 2012-06-04 DIAGNOSIS — R262 Difficulty in walking, not elsewhere classified: Secondary | ICD-10-CM | POA: Diagnosis not present

## 2012-06-04 DIAGNOSIS — A0472 Enterocolitis due to Clostridium difficile, not specified as recurrent: Secondary | ICD-10-CM | POA: Diagnosis not present

## 2012-06-04 DIAGNOSIS — I10 Essential (primary) hypertension: Secondary | ICD-10-CM | POA: Diagnosis not present

## 2012-06-08 DIAGNOSIS — E119 Type 2 diabetes mellitus without complications: Secondary | ICD-10-CM | POA: Diagnosis not present

## 2012-06-08 DIAGNOSIS — Z5189 Encounter for other specified aftercare: Secondary | ICD-10-CM | POA: Diagnosis not present

## 2012-06-08 DIAGNOSIS — A0472 Enterocolitis due to Clostridium difficile, not specified as recurrent: Secondary | ICD-10-CM | POA: Diagnosis not present

## 2012-06-08 DIAGNOSIS — I10 Essential (primary) hypertension: Secondary | ICD-10-CM | POA: Diagnosis not present

## 2012-06-08 DIAGNOSIS — R262 Difficulty in walking, not elsewhere classified: Secondary | ICD-10-CM | POA: Diagnosis not present

## 2012-06-09 DIAGNOSIS — A0472 Enterocolitis due to Clostridium difficile, not specified as recurrent: Secondary | ICD-10-CM | POA: Diagnosis not present

## 2012-06-09 DIAGNOSIS — E119 Type 2 diabetes mellitus without complications: Secondary | ICD-10-CM | POA: Diagnosis not present

## 2012-06-09 DIAGNOSIS — R262 Difficulty in walking, not elsewhere classified: Secondary | ICD-10-CM | POA: Diagnosis not present

## 2012-06-09 DIAGNOSIS — Z5189 Encounter for other specified aftercare: Secondary | ICD-10-CM | POA: Diagnosis not present

## 2012-06-09 DIAGNOSIS — I10 Essential (primary) hypertension: Secondary | ICD-10-CM | POA: Diagnosis not present

## 2012-06-10 DIAGNOSIS — I10 Essential (primary) hypertension: Secondary | ICD-10-CM | POA: Diagnosis not present

## 2012-06-10 DIAGNOSIS — E119 Type 2 diabetes mellitus without complications: Secondary | ICD-10-CM | POA: Diagnosis not present

## 2012-06-10 DIAGNOSIS — A0472 Enterocolitis due to Clostridium difficile, not specified as recurrent: Secondary | ICD-10-CM | POA: Diagnosis not present

## 2012-06-10 DIAGNOSIS — R262 Difficulty in walking, not elsewhere classified: Secondary | ICD-10-CM | POA: Diagnosis not present

## 2012-06-10 DIAGNOSIS — Z5189 Encounter for other specified aftercare: Secondary | ICD-10-CM | POA: Diagnosis not present

## 2012-06-11 DIAGNOSIS — R262 Difficulty in walking, not elsewhere classified: Secondary | ICD-10-CM | POA: Diagnosis not present

## 2012-06-11 DIAGNOSIS — A0472 Enterocolitis due to Clostridium difficile, not specified as recurrent: Secondary | ICD-10-CM | POA: Diagnosis not present

## 2012-06-11 DIAGNOSIS — Z5189 Encounter for other specified aftercare: Secondary | ICD-10-CM | POA: Diagnosis not present

## 2012-06-11 DIAGNOSIS — E119 Type 2 diabetes mellitus without complications: Secondary | ICD-10-CM | POA: Diagnosis not present

## 2012-06-11 DIAGNOSIS — I10 Essential (primary) hypertension: Secondary | ICD-10-CM | POA: Diagnosis not present

## 2012-06-15 DIAGNOSIS — R5383 Other fatigue: Secondary | ICD-10-CM | POA: Diagnosis not present

## 2012-06-15 DIAGNOSIS — R197 Diarrhea, unspecified: Secondary | ICD-10-CM | POA: Diagnosis not present

## 2012-06-15 DIAGNOSIS — E1139 Type 2 diabetes mellitus with other diabetic ophthalmic complication: Secondary | ICD-10-CM | POA: Diagnosis not present

## 2012-06-15 DIAGNOSIS — R5381 Other malaise: Secondary | ICD-10-CM | POA: Diagnosis not present

## 2012-06-15 DIAGNOSIS — A0472 Enterocolitis due to Clostridium difficile, not specified as recurrent: Secondary | ICD-10-CM | POA: Diagnosis not present

## 2012-06-16 DIAGNOSIS — R262 Difficulty in walking, not elsewhere classified: Secondary | ICD-10-CM | POA: Diagnosis not present

## 2012-06-16 DIAGNOSIS — A0472 Enterocolitis due to Clostridium difficile, not specified as recurrent: Secondary | ICD-10-CM | POA: Diagnosis not present

## 2012-06-16 DIAGNOSIS — I10 Essential (primary) hypertension: Secondary | ICD-10-CM | POA: Diagnosis not present

## 2012-06-16 DIAGNOSIS — E119 Type 2 diabetes mellitus without complications: Secondary | ICD-10-CM | POA: Diagnosis not present

## 2012-06-16 DIAGNOSIS — Z5189 Encounter for other specified aftercare: Secondary | ICD-10-CM | POA: Diagnosis not present

## 2012-06-17 DIAGNOSIS — I10 Essential (primary) hypertension: Secondary | ICD-10-CM | POA: Diagnosis not present

## 2012-06-17 DIAGNOSIS — I4891 Unspecified atrial fibrillation: Secondary | ICD-10-CM | POA: Diagnosis not present

## 2012-06-17 DIAGNOSIS — R197 Diarrhea, unspecified: Secondary | ICD-10-CM | POA: Diagnosis not present

## 2012-06-17 DIAGNOSIS — Z950 Presence of cardiac pacemaker: Secondary | ICD-10-CM | POA: Diagnosis not present

## 2012-06-17 DIAGNOSIS — I5042 Chronic combined systolic (congestive) and diastolic (congestive) heart failure: Secondary | ICD-10-CM | POA: Diagnosis not present

## 2012-06-17 DIAGNOSIS — E782 Mixed hyperlipidemia: Secondary | ICD-10-CM | POA: Diagnosis not present

## 2012-06-18 DIAGNOSIS — E119 Type 2 diabetes mellitus without complications: Secondary | ICD-10-CM | POA: Diagnosis not present

## 2012-06-18 DIAGNOSIS — A0472 Enterocolitis due to Clostridium difficile, not specified as recurrent: Secondary | ICD-10-CM | POA: Diagnosis not present

## 2012-06-18 DIAGNOSIS — R197 Diarrhea, unspecified: Secondary | ICD-10-CM | POA: Diagnosis not present

## 2012-06-18 DIAGNOSIS — R262 Difficulty in walking, not elsewhere classified: Secondary | ICD-10-CM | POA: Diagnosis not present

## 2012-06-18 DIAGNOSIS — I10 Essential (primary) hypertension: Secondary | ICD-10-CM | POA: Diagnosis not present

## 2012-06-18 DIAGNOSIS — Z5189 Encounter for other specified aftercare: Secondary | ICD-10-CM | POA: Diagnosis not present

## 2012-06-23 DIAGNOSIS — I10 Essential (primary) hypertension: Secondary | ICD-10-CM | POA: Diagnosis not present

## 2012-06-23 DIAGNOSIS — A0472 Enterocolitis due to Clostridium difficile, not specified as recurrent: Secondary | ICD-10-CM | POA: Diagnosis not present

## 2012-06-23 DIAGNOSIS — E119 Type 2 diabetes mellitus without complications: Secondary | ICD-10-CM | POA: Diagnosis not present

## 2012-06-23 DIAGNOSIS — R262 Difficulty in walking, not elsewhere classified: Secondary | ICD-10-CM | POA: Diagnosis not present

## 2012-06-23 DIAGNOSIS — Z5189 Encounter for other specified aftercare: Secondary | ICD-10-CM | POA: Diagnosis not present

## 2012-06-25 DIAGNOSIS — I10 Essential (primary) hypertension: Secondary | ICD-10-CM | POA: Diagnosis not present

## 2012-06-25 DIAGNOSIS — E119 Type 2 diabetes mellitus without complications: Secondary | ICD-10-CM | POA: Diagnosis not present

## 2012-06-25 DIAGNOSIS — Z5189 Encounter for other specified aftercare: Secondary | ICD-10-CM | POA: Diagnosis not present

## 2012-06-25 DIAGNOSIS — F411 Generalized anxiety disorder: Secondary | ICD-10-CM | POA: Diagnosis not present

## 2012-06-25 DIAGNOSIS — R262 Difficulty in walking, not elsewhere classified: Secondary | ICD-10-CM | POA: Diagnosis not present

## 2012-06-25 DIAGNOSIS — R197 Diarrhea, unspecified: Secondary | ICD-10-CM | POA: Diagnosis not present

## 2012-06-25 DIAGNOSIS — E1139 Type 2 diabetes mellitus with other diabetic ophthalmic complication: Secondary | ICD-10-CM | POA: Diagnosis not present

## 2012-06-25 DIAGNOSIS — A0472 Enterocolitis due to Clostridium difficile, not specified as recurrent: Secondary | ICD-10-CM | POA: Diagnosis not present

## 2012-06-30 DIAGNOSIS — Z5189 Encounter for other specified aftercare: Secondary | ICD-10-CM | POA: Diagnosis not present

## 2012-06-30 DIAGNOSIS — E119 Type 2 diabetes mellitus without complications: Secondary | ICD-10-CM | POA: Diagnosis not present

## 2012-06-30 DIAGNOSIS — R262 Difficulty in walking, not elsewhere classified: Secondary | ICD-10-CM | POA: Diagnosis not present

## 2012-06-30 DIAGNOSIS — A0472 Enterocolitis due to Clostridium difficile, not specified as recurrent: Secondary | ICD-10-CM | POA: Diagnosis not present

## 2012-06-30 DIAGNOSIS — I10 Essential (primary) hypertension: Secondary | ICD-10-CM | POA: Diagnosis not present

## 2012-07-01 DIAGNOSIS — A0472 Enterocolitis due to Clostridium difficile, not specified as recurrent: Secondary | ICD-10-CM | POA: Diagnosis not present

## 2012-07-02 DIAGNOSIS — A0472 Enterocolitis due to Clostridium difficile, not specified as recurrent: Secondary | ICD-10-CM | POA: Diagnosis not present

## 2012-07-02 DIAGNOSIS — I10 Essential (primary) hypertension: Secondary | ICD-10-CM | POA: Diagnosis not present

## 2012-07-02 DIAGNOSIS — E119 Type 2 diabetes mellitus without complications: Secondary | ICD-10-CM | POA: Diagnosis not present

## 2012-07-02 DIAGNOSIS — Z5189 Encounter for other specified aftercare: Secondary | ICD-10-CM | POA: Diagnosis not present

## 2012-07-02 DIAGNOSIS — R262 Difficulty in walking, not elsewhere classified: Secondary | ICD-10-CM | POA: Diagnosis not present

## 2012-07-29 DIAGNOSIS — R197 Diarrhea, unspecified: Secondary | ICD-10-CM | POA: Diagnosis not present

## 2012-08-25 DIAGNOSIS — R197 Diarrhea, unspecified: Secondary | ICD-10-CM | POA: Diagnosis not present

## 2012-09-14 DIAGNOSIS — R197 Diarrhea, unspecified: Secondary | ICD-10-CM | POA: Diagnosis not present

## 2012-09-15 DIAGNOSIS — R197 Diarrhea, unspecified: Secondary | ICD-10-CM | POA: Diagnosis not present

## 2012-10-12 DIAGNOSIS — Z950 Presence of cardiac pacemaker: Secondary | ICD-10-CM | POA: Diagnosis not present

## 2012-10-12 DIAGNOSIS — I442 Atrioventricular block, complete: Secondary | ICD-10-CM | POA: Diagnosis not present

## 2012-10-14 DIAGNOSIS — H409 Unspecified glaucoma: Secondary | ICD-10-CM | POA: Diagnosis not present

## 2012-10-14 DIAGNOSIS — Z961 Presence of intraocular lens: Secondary | ICD-10-CM | POA: Diagnosis not present

## 2012-10-14 DIAGNOSIS — H35379 Puckering of macula, unspecified eye: Secondary | ICD-10-CM | POA: Diagnosis not present

## 2012-10-14 DIAGNOSIS — H4011X0 Primary open-angle glaucoma, stage unspecified: Secondary | ICD-10-CM | POA: Diagnosis not present

## 2012-10-14 DIAGNOSIS — H35039 Hypertensive retinopathy, unspecified eye: Secondary | ICD-10-CM | POA: Diagnosis not present

## 2012-10-29 DIAGNOSIS — E1139 Type 2 diabetes mellitus with other diabetic ophthalmic complication: Secondary | ICD-10-CM | POA: Diagnosis not present

## 2012-10-29 DIAGNOSIS — Z6829 Body mass index (BMI) 29.0-29.9, adult: Secondary | ICD-10-CM | POA: Diagnosis not present

## 2012-10-29 DIAGNOSIS — K589 Irritable bowel syndrome without diarrhea: Secondary | ICD-10-CM | POA: Diagnosis not present

## 2012-10-29 DIAGNOSIS — R197 Diarrhea, unspecified: Secondary | ICD-10-CM | POA: Diagnosis not present

## 2012-10-29 DIAGNOSIS — I10 Essential (primary) hypertension: Secondary | ICD-10-CM | POA: Diagnosis not present

## 2012-10-29 DIAGNOSIS — F411 Generalized anxiety disorder: Secondary | ICD-10-CM | POA: Diagnosis not present

## 2012-11-27 DIAGNOSIS — Z23 Encounter for immunization: Secondary | ICD-10-CM | POA: Diagnosis not present

## 2012-11-27 DIAGNOSIS — R42 Dizziness and giddiness: Secondary | ICD-10-CM | POA: Diagnosis not present

## 2012-11-27 DIAGNOSIS — H903 Sensorineural hearing loss, bilateral: Secondary | ICD-10-CM | POA: Diagnosis not present

## 2012-12-10 DIAGNOSIS — H01009 Unspecified blepharitis unspecified eye, unspecified eyelid: Secondary | ICD-10-CM | POA: Diagnosis not present

## 2012-12-10 DIAGNOSIS — H04129 Dry eye syndrome of unspecified lacrimal gland: Secondary | ICD-10-CM | POA: Diagnosis not present

## 2012-12-10 DIAGNOSIS — H409 Unspecified glaucoma: Secondary | ICD-10-CM | POA: Diagnosis not present

## 2012-12-10 DIAGNOSIS — H4011X0 Primary open-angle glaucoma, stage unspecified: Secondary | ICD-10-CM | POA: Diagnosis not present

## 2013-02-15 DIAGNOSIS — R197 Diarrhea, unspecified: Secondary | ICD-10-CM | POA: Diagnosis not present

## 2013-02-15 DIAGNOSIS — K589 Irritable bowel syndrome without diarrhea: Secondary | ICD-10-CM | POA: Diagnosis not present

## 2013-02-15 DIAGNOSIS — E1139 Type 2 diabetes mellitus with other diabetic ophthalmic complication: Secondary | ICD-10-CM | POA: Diagnosis not present

## 2013-02-15 DIAGNOSIS — Z6829 Body mass index (BMI) 29.0-29.9, adult: Secondary | ICD-10-CM | POA: Diagnosis not present

## 2013-02-15 DIAGNOSIS — I4891 Unspecified atrial fibrillation: Secondary | ICD-10-CM | POA: Diagnosis not present

## 2013-02-15 DIAGNOSIS — I10 Essential (primary) hypertension: Secondary | ICD-10-CM | POA: Diagnosis not present

## 2013-02-15 DIAGNOSIS — J309 Allergic rhinitis, unspecified: Secondary | ICD-10-CM | POA: Diagnosis not present

## 2013-02-15 DIAGNOSIS — Z23 Encounter for immunization: Secondary | ICD-10-CM | POA: Diagnosis not present

## 2013-02-22 ENCOUNTER — Encounter: Payer: Self-pay | Admitting: Internal Medicine

## 2013-02-22 ENCOUNTER — Ambulatory Visit (INDEPENDENT_AMBULATORY_CARE_PROVIDER_SITE_OTHER): Payer: Medicare Other | Admitting: Internal Medicine

## 2013-02-22 VITALS — BP 112/60 | HR 61 | Ht 71.0 in | Wt 199.2 lb

## 2013-02-22 DIAGNOSIS — I4430 Unspecified atrioventricular block: Secondary | ICD-10-CM

## 2013-02-22 DIAGNOSIS — I4891 Unspecified atrial fibrillation: Secondary | ICD-10-CM

## 2013-02-22 DIAGNOSIS — I442 Atrioventricular block, complete: Secondary | ICD-10-CM | POA: Diagnosis not present

## 2013-02-22 DIAGNOSIS — I4821 Permanent atrial fibrillation: Secondary | ICD-10-CM | POA: Insufficient documentation

## 2013-02-22 DIAGNOSIS — Z950 Presence of cardiac pacemaker: Secondary | ICD-10-CM

## 2013-02-22 LAB — MDC_IDC_ENUM_SESS_TYPE_INCLINIC
Brady Statistic RA Percent Paced: 46 %
Brady Statistic RV Percent Paced: 100 %
Implantable Pulse Generator Serial Number: 1110590
Lead Channel Impedance Value: 457 Ohm
Lead Channel Pacing Threshold Amplitude: 1.125 V
Lead Channel Sensing Intrinsic Amplitude: 9.5 mV
Lead Channel Setting Sensing Sensitivity: 2 mV

## 2013-02-22 NOTE — Progress Notes (Signed)
Randall Noe, MD is primary Cardiologist:  Randall Odom is a 79 y.o. male with a h/o complete heart block sp PPM (SJM) by Dr Randall Odom who presents today to establish care in the Electrophysiology device clinic.  He initially underwent PPM in 1989.  He underwent upgrade to dual chamber device 09/28/1997.  His most recent generator replacement was 12/06/2004.  He has atrial fibrillation but has been felt to be a poor candidate for anticoagulation due to frequent falls.   Today, he  denies symptoms of palpitations, chest pain, shortness of breath, orthopnea, PND, lower extremity edema, dizziness, presyncope, syncope, or neurologic sequela.  The patientis tolerating medications without difficulties and is otherwise without complaint today.   Past Medical History  Diagnosis Date  . Hypertension   . Muscle spasm   . Diabetes mellitus   . Enlarged prostate   . CAD (coronary artery disease)   . Complete heart block   . Diarrhea     From C. Diff infection   . Erectile dysfunction   . Low back pain   . Thoracic or lumbosacral neuritis or radiculitis, unspecified   . Depressive disorder, not elsewhere classified   . Carpal tunnel syndrome   . Allergic rhinitis   . Diverticulosis of colon (without mention of hemorrhage)   . Sacroiliitis, not elsewhere classified   . Abdominal pain, epigastric   . Atrial fibrillation with controlled ventricular response   . Recurrent falls   . Gait abnormality   . Dyspnea     ECHO 04/20/12 estimated EF = 40-45%.    . Chronic combined systolic and diastolic CHF (congestive heart failure)   . Intestinal infection due to Clostridium difficile   . Arthritis   . Glaucoma     History of glaucoma  . History of BPH   . DJD (degenerative joint disease)   . Anxiety   . Pure hypercholesterolemia   . Mixed hyperlipidemia     Followed by Dr. Felipa Odom  . Left atrial enlargement     Moderate by ECHO 04/20/12  . Atrial enlargement, right     Moderate by ECHO 04/20/12   . Bronchitis    Past Surgical History  Procedure Laterality Date  . Pacemaker insertion  09/28/1997    DDD for high-grade AV Block. Had an upgrade to a dual chamber device on 09/09/97. Replaced dual chamber battery on 12/06/04. New device is a [employer] ge[employer] Identity ADX XL DR, model [id number], seriel [id number].  Marland Kitchen Appendectomy  1966  . Tonsillectomy  1953    History   Social History  . Marital Status: Married    Spouse Name: N/A    Number of Children: N/A  . Years of Education: N/A   Occupational History  . Not on file.   Social History Main Topics  . Smoking status: Former Smoker    Quit date: 06/02/1973  . Smokeless tobacco: Never Used  . Alcohol Use: No  . Drug Use: No  . Sexual Activity: Not on file   Other Topics Concern  . Not on file   Social History Narrative  . No narrative on file    Family History  Problem Relation Age of Onset  . Cancer Mother     breast  . CVA Father   . Anemia Brother   . Heart disease Brother     Allergies  Allergen Reactions  . Spironolactone Diarrhea and Nausea And Vomiting    Stopped after 2 doses due to vomiting and  diarrhea    Current Outpatient Prescriptions  Medication Sig Dispense Refill  . aspirin EC 81 MG tablet Take 1 tablet (81 mg total) by mouth daily.      . Cholecalciferol (VITAMIN D) 2000 UNITS tablet Take 2,000 Units by mouth daily.      . diazepam (VALIUM) 10 MG tablet Take 1 tablet (10 mg total) by mouth every 6 (six) hours as needed. anxiety  30 tablet  0  . escitalopram (LEXAPRO) 10 MG tablet Take 1 tablet (10 mg total) by mouth daily.  30 tablet  1  . fluticasone (FLONASE) 50 MCG/ACT nasal spray Place 2 sprays into the nose daily as needed for allergies.      Marland Kitchen HYDROcodone-acetaminophen (NORCO/VICODIN) 5-325 MG per tablet Take 1-2 tablets by mouth every 4 (four) hours as needed.  60 tablet  0  . Multiple Vitamin (MULTIVITAMIN WITH MINERALS) TABS Take 1 tablet by mouth daily.      Marland Kitchen saccharomyces boulardii  (FLORASTOR) 250 MG capsule Take 1 capsule (250 mg total) by mouth 2 (two) times daily.  60 capsule  0  . simvastatin (ZOCOR) 40 MG tablet Take 1 tablet (40 mg total) by mouth at bedtime.  30 tablet  1  . terazosin (HYTRIN) 5 MG capsule Take 1 capsule (5 mg total) by mouth at bedtime.  30 capsule  1  . venlafaxine XR (EFFEXOR-XR) 75 MG 24 hr capsule Take 1 capsule (75 mg total) by mouth daily.  30 capsule  1   No current facility-administered medications for this visit.    ROS- all systems are reviewed and negative except as per HPI  Physical Exam: Filed Vitals:   02/22/13 1518  BP: 112/60  Pulse: 61  Height: 5\' 11"  (1.803 m)  Weight: 199 lb 3.2 oz (90.357 kg)    GEN- The patient is elderly appearing, alert and oriented x 3 today.   Head- normocephalic, atraumatic Eyes-  Sclera clear, conjunctiva pink Ears- hearing intact Oropharynx- clear Neck- supple  Lungs- Clear to ausculation bilaterally, normal work of breathing Chest- pacemaker pocket is well healed Heart- Regular rate and rhythm (paced) GI- soft, NT, ND, + BS Extremities- no clubbing, cyanosis, + edema MS- no significant deformity or atrophy Skin- no rash or lesion Psych- euthymic mood, full affect Neuro- strength and sensation are intact  Pacemaker interrogation- reviewed in detail today,  See PACEART report  Assessment and Plan:  1. Complete heart block Normal pacemaker function See Pace Art report  2. Permanent afib Reprogrammed VVIR today Not previously felt to be a candidate for anticoagulation due to frequent falls.  3. Chronic systolic dysfunction EF 40% Compensated presently No changes today  Return to the device clinic in 6 months I will see again in 1 year

## 2013-02-22 NOTE — Patient Instructions (Addendum)
Follow up with the device clinic on 08-25-2013 @ 3:30 pm.  Your physician wants you to follow-up in: December 2015 with Dr. Johney Frame. You will receive a reminder letter in the mail two months in advance. If you don't receive a letter, please call our office to schedule the follow-up appointment.  Follow up with Dr.Smith as scheduled.

## 2013-02-26 ENCOUNTER — Telehealth: Payer: Self-pay | Admitting: Interventional Cardiology

## 2013-02-26 NOTE — Telephone Encounter (Signed)
New  Message ° °Pt returned call  °

## 2013-02-26 NOTE — Telephone Encounter (Signed)
Returning call.  States she received a recorded message from [facility].  Message wasn't clear.  Checked with device clinic and they didn't call.  Advised her that not sure who had called.  States he can't hear well so she usually answers messages.

## 2013-05-20 ENCOUNTER — Encounter: Payer: Self-pay | Admitting: Interventional Cardiology

## 2013-06-23 ENCOUNTER — Ambulatory Visit: Payer: Medicare Other | Admitting: Interventional Cardiology

## 2013-06-25 ENCOUNTER — Ambulatory Visit: Payer: Medicare Other | Admitting: Interventional Cardiology

## 2013-06-28 DIAGNOSIS — E1149 Type 2 diabetes mellitus with other diabetic neurological complication: Secondary | ICD-10-CM | POA: Diagnosis not present

## 2013-06-28 DIAGNOSIS — Z1331 Encounter for screening for depression: Secondary | ICD-10-CM | POA: Diagnosis not present

## 2013-06-28 DIAGNOSIS — F411 Generalized anxiety disorder: Secondary | ICD-10-CM | POA: Diagnosis not present

## 2013-06-28 DIAGNOSIS — I10 Essential (primary) hypertension: Secondary | ICD-10-CM | POA: Diagnosis not present

## 2013-06-28 DIAGNOSIS — J309 Allergic rhinitis, unspecified: Secondary | ICD-10-CM | POA: Diagnosis not present

## 2013-06-28 DIAGNOSIS — E1139 Type 2 diabetes mellitus with other diabetic ophthalmic complication: Secondary | ICD-10-CM | POA: Diagnosis not present

## 2013-06-28 DIAGNOSIS — I4891 Unspecified atrial fibrillation: Secondary | ICD-10-CM | POA: Diagnosis not present

## 2013-06-28 DIAGNOSIS — Z6830 Body mass index (BMI) 30.0-30.9, adult: Secondary | ICD-10-CM | POA: Diagnosis not present

## 2013-08-09 ENCOUNTER — Ambulatory Visit (INDEPENDENT_AMBULATORY_CARE_PROVIDER_SITE_OTHER): Payer: Medicare Other | Admitting: Interventional Cardiology

## 2013-08-09 ENCOUNTER — Encounter: Payer: Self-pay | Admitting: Interventional Cardiology

## 2013-08-09 VITALS — BP 110/62 | HR 59 | Ht 70.0 in | Wt 206.0 lb

## 2013-08-09 DIAGNOSIS — R0989 Other specified symptoms and signs involving the circulatory and respiratory systems: Secondary | ICD-10-CM | POA: Diagnosis not present

## 2013-08-09 DIAGNOSIS — I4891 Unspecified atrial fibrillation: Secondary | ICD-10-CM

## 2013-08-09 DIAGNOSIS — R0609 Other forms of dyspnea: Secondary | ICD-10-CM | POA: Insufficient documentation

## 2013-08-09 DIAGNOSIS — Z950 Presence of cardiac pacemaker: Secondary | ICD-10-CM

## 2013-08-09 DIAGNOSIS — I5042 Chronic combined systolic (congestive) and diastolic (congestive) heart failure: Secondary | ICD-10-CM | POA: Diagnosis not present

## 2013-08-09 DIAGNOSIS — E785 Hyperlipidemia, unspecified: Secondary | ICD-10-CM

## 2013-08-09 DIAGNOSIS — R0600 Dyspnea, unspecified: Secondary | ICD-10-CM

## 2013-08-09 DIAGNOSIS — I10 Essential (primary) hypertension: Secondary | ICD-10-CM | POA: Diagnosis not present

## 2013-08-09 LAB — BASIC METABOLIC PANEL
BUN: 17 mg/dL (ref 6–23)
CALCIUM: 9.7 mg/dL (ref 8.4–10.5)
CHLORIDE: 103 meq/L (ref 96–112)
CO2: 30 meq/L (ref 19–32)
CREATININE: 1 mg/dL (ref 0.4–1.5)
GFR: 72.94 mL/min (ref >60.00)
GLUCOSE: 119 mg/dL — AB (ref 70–99)
Potassium: 4.5 mEq/L (ref 3.5–5.1)
Sodium: 141 mEq/L (ref 135–145)

## 2013-08-09 LAB — CBC WITH DIFFERENTIAL/PLATELET
BASOS ABS: 0 10*3/uL (ref 0.0–0.1)
Basophils Relative: 0.4 % (ref 0.0–3.0)
EOS ABS: 0.2 10*3/uL (ref 0.0–0.7)
Eosinophils Relative: 1.3 % (ref 0.0–5.0)
HEMATOCRIT: 44.7 % (ref 39.0–52.0)
Hemoglobin: 14.8 g/dL (ref 13.0–17.0)
LYMPHS ABS: 3.7 10*3/uL (ref 0.7–4.0)
Lymphocytes Relative: 31.3 % (ref 12.0–46.0)
MCHC: 33.2 g/dL (ref 30.0–36.0)
MCV: 95.4 fl (ref 78.0–100.0)
MONO ABS: 1.5 10*3/uL — AB (ref 0.1–1.0)
Monocytes Relative: 13 % — ABNORMAL HIGH (ref 3.0–12.0)
NEUTROS PCT: 54 % (ref 43.0–77.0)
Neutro Abs: 6.4 10*3/uL (ref 1.4–7.7)
PLATELETS: 154 10*3/uL (ref 150.0–400.0)
RBC: 4.68 Mil/uL (ref 4.22–5.81)
RDW: 13.6 % (ref 11.5–15.5)
WBC: 11.8 10*3/uL — AB (ref 4.0–10.5)

## 2013-08-09 LAB — HEMOGLOBIN: HEMOGLOBIN: 14.8 g/dL (ref 13.0–17.0)

## 2013-08-09 NOTE — Patient Instructions (Signed)
Your physician recommends that you continue on your current medications as directed. Please refer to the Current Medication list given to you today.  Lab Today: Bmet, Bnp, Cbc, Hemoglobin  Your physician has requested that you have an echocardiogram. Echocardiography is a painless test that uses sound waves to create images of your heart. It provides your doctor with information about the size and shape of your heart and how well your heart's chambers and valves are working. This procedure takes approximately one hour. There are no restrictions for this procedure.   Non-Cardiac CT Angiography (CTA), is a special type of CT scan that uses a computer to produce multi-dimensional views of major blood vessels throughout the body. In CT angiography, a contrast material is injected through an IV to help visualize the blood vessels  Follow up pending results

## 2013-08-09 NOTE — Progress Notes (Signed)
Patient ID: Randall Odom, male   DOB: 1926-12-17, 80 y.o.   MRN: [id number]    [address], [address], [place]  [address] Phone: [phone] Fax:  [phone]  Date:  08/09/2013   ID:  Randall Odom, DOB 10/19/1926, MRN [id number]  PCP:  Sinclair Grooms, MD   ASSESSMENT:  1. Progressive dyspnea over the past 3-4 weeks. There is a component of orthopnea 2. Chronic atrial fibrillation, since 2013. He is not on chronic anticoagulation therapy  3. Hypertension 4. Chronic systolic heart failure, last documented EF 40%, February 2014 5. Coronary artery disease  PLAN:  1. BNP, hemoglobin, and basic metabolic panel 2. 2-D Doppler echocardiogram to followup on no chronic systolic dysfunction 3. CT angiogram of the lungs to rule out pulmonary emboli 4. Start anticoagulation therapy 5. Further therapy depending upon the workup as outlined   SUBJECTIVE: Randall Odom is a 80 y.o. male who is accompanied by his son and wife. He has progressive dyspnea on exertion over the past 4-6 weeks. He denies chest discomfort. There is a component of orthopnea. There are marked limitations with trying to exert himself. O2 saturation today at rest is greater than 96%. He denies lower extremity edema. He has not had syncope. In reviewing the [facility] cardiology records, it appears that the patient has been lost to followup because shortly after we began management, he developed C. difficile and came under gastroenterology care and most followup visits have been fair. It seems as though shortness of breath if he was having in the winter and spring of 2014 seemed to resolve after he was hospitalized and treated for C. difficile. The dyspnea has not recurred.   Wt Readings from Last 3 Encounters:  08/09/13 206 lb (93.441 kg)  02/22/13 199 lb 3.2 oz (90.357 kg)  06/02/12 195 lb (88.451 kg)     Past Medical History  Diagnosis Date  . Hypertension   . Muscle spasm   . Diabetes mellitus   .  Enlarged prostate   . CAD (coronary artery disease)   . Complete heart block   . Diarrhea     From C. Diff infection   . Erectile dysfunction   . Low back pain   . Thoracic or lumbosacral neuritis or radiculitis, unspecified   . Depressive disorder, not elsewhere classified   . Carpal tunnel syndrome   . Allergic rhinitis   . Diverticulosis of colon (without mention of hemorrhage)   . Sacroiliitis, not elsewhere classified   . Abdominal pain, epigastric   . Atrial fibrillation with controlled ventricular response   . Recurrent falls   . Gait abnormality   . Dyspnea     ECHO 04/20/12 estimated EF = 40-45%.    . Chronic combined systolic and diastolic CHF (congestive heart failure)   . Intestinal infection due to Clostridium difficile   . Arthritis   . Glaucoma     History of glaucoma  . History of BPH   . DJD (degenerative joint disease)   . Anxiety   . Pure hypercholesterolemia   . Mixed hyperlipidemia     Followed by Dr. Dagmar Hait  . Left atrial enlargement     Moderate by ECHO 04/20/12  . Atrial enlargement, right     Moderate by ECHO 04/20/12  . Bronchitis     Current Outpatient Prescriptions  Medication Sig Dispense Refill  . aspirin EC 81 MG tablet Take 1 tablet (81 mg total) by mouth daily.      Marland Kitchen  Cholecalciferol (VITAMIN D) 2000 UNITS tablet Take 2,000 Units by mouth daily.      . fluticasone (FLONASE) 50 MCG/ACT nasal spray Place 2 sprays into the nose daily as needed for allergies.      Marland Kitchen HYDROcodone-acetaminophen (NORCO/VICODIN) 5-325 MG per tablet Take 1-2 tablets by mouth every 4 (four) hours as needed.  60 tablet  0  . Multiple Vitamin (MULTIVITAMIN WITH MINERALS) TABS Take 1 tablet by mouth daily.      . simvastatin (ZOCOR) 40 MG tablet Take 1 tablet (40 mg total) by mouth at bedtime.  30 tablet  1  . terazosin (HYTRIN) 5 MG capsule Take 1 capsule (5 mg total) by mouth at bedtime.  30 capsule  1  . venlafaxine XR (EFFEXOR-XR) 75 MG 24 hr capsule Take 1 capsule (75  mg total) by mouth daily.  30 capsule  1   No current facility-administered medications for this visit.    Allergies:    Allergies  Allergen Reactions  . Spironolactone Diarrhea and Nausea And Vomiting    Stopped after 2 doses due to vomiting and diarrhea    Social History:  The patient  reports that he quit smoking about 40 years ago. He has never used smokeless tobacco. He reports that he does not drink alcohol or use illicit drugs.   ROS:  Please see the history of present illness.   Appetite is stable. Sleep pattern is been stable. He has not had syncope or worsening swelling.   All other systems reviewed and negative.   OBJECTIVE: VS:  BP 110/62  Pulse 59  Ht 5\' 10"  (1.778 m)  Wt 206 lb (93.441 kg)  BMI 29.56 kg/m2 Well nourished, well developed, in no acute distress, obese, appears sad. HEENT: normal Neck: JVD moderate elevation. Carotid bruit absent  Cardiac:  normal S1, S2; RRR; no murmur. Heart sounds are somewhat distant. No obvious gallop. Lungs:  clear to auscultation bilaterally, no wheezing, rhonchi or rales Abd: soft, nontender, no hepatomegaly Ext: Edema no edema. Pulses 2+ bilateral in the lower extremities at all stations Skin: warm and dry Neuro:  CNs 2-12 intact, no focal abnormalities noted  EKG:  Ventricular pacing, no atrial activity compatible with atrial fibrillation       Signed, Illene Labrador III, MD 08/09/2013 3:20 PM

## 2013-08-10 ENCOUNTER — Telehealth: Payer: Self-pay

## 2013-08-10 DIAGNOSIS — I5042 Chronic combined systolic (congestive) and diastolic (congestive) heart failure: Secondary | ICD-10-CM

## 2013-08-10 DIAGNOSIS — I4891 Unspecified atrial fibrillation: Secondary | ICD-10-CM

## 2013-08-10 LAB — BRAIN NATRIURETIC PEPTIDE: Pro B Natriuretic peptide (BNP): 71 pg/mL (ref 0.0–100.0)

## 2013-08-10 MED ORDER — FUROSEMIDE 40 MG PO TABS: 40.0000 mg | ORAL_TABLET | Freq: Every day | ORAL | Status: DC

## 2013-08-10 MED ORDER — APIXABAN 5 MG PO TABS: 5.0000 mg | ORAL_TABLET | Freq: Two times a day (BID) | ORAL | Status: DC

## 2013-08-10 NOTE — Telephone Encounter (Signed)
pt given Dr.Smith recommendations.verified Eliquis dosage with Wilson Singer.Pharm-D pt to start Elquis 5mg  bid and lasix 40mg  daily.appt made for 6/4 @8 :30.pt wife aware of instructions and appt date and time.pt wife sts that she will pick up meds this afternoon pt will take first dose of Eliquis and lasix today.pt agreeable with plan and verbalized understanding

## 2013-08-11 ENCOUNTER — Ambulatory Visit (INDEPENDENT_AMBULATORY_CARE_PROVIDER_SITE_OTHER)
Admission: RE | Admit: 2013-08-11 | Discharge: 2013-08-11 | Disposition: A | Discharge status: Home / Self Care | Payer: Medicare Other | Source: Ambulatory Visit | Attending: Interventional Cardiology | Admitting: Interventional Cardiology

## 2013-08-11 DIAGNOSIS — R0989 Other specified symptoms and signs involving the circulatory and respiratory systems: Secondary | ICD-10-CM

## 2013-08-11 DIAGNOSIS — J438 Other emphysema: Secondary | ICD-10-CM | POA: Diagnosis not present

## 2013-08-11 DIAGNOSIS — R0609 Other forms of dyspnea: Secondary | ICD-10-CM | POA: Diagnosis not present

## 2013-08-11 DIAGNOSIS — R0600 Dyspnea, unspecified: Secondary | ICD-10-CM

## 2013-08-11 MED ORDER — IOHEXOL 350 MG/ML SOLN
80.0000 mL | Freq: Once | INTRAVENOUS | Status: AC | PRN
  Administered 2013-08-11: 80 mL via INTRAVENOUS

## 2013-08-12 ENCOUNTER — Institutional Professional Consult (permissible substitution): Payer: Medicare Other | Admitting: Internal Medicine

## 2013-08-12 ENCOUNTER — Encounter: Payer: Self-pay | Admitting: Interventional Cardiology

## 2013-08-12 ENCOUNTER — Ambulatory Visit (INDEPENDENT_AMBULATORY_CARE_PROVIDER_SITE_OTHER): Payer: Medicare Other | Admitting: Interventional Cardiology

## 2013-08-12 VITALS — BP 125/70 | HR 62 | Ht 70.0 in | Wt 204.0 lb

## 2013-08-12 DIAGNOSIS — I10 Essential (primary) hypertension: Secondary | ICD-10-CM

## 2013-08-12 DIAGNOSIS — I5042 Chronic combined systolic (congestive) and diastolic (congestive) heart failure: Secondary | ICD-10-CM | POA: Diagnosis not present

## 2013-08-12 DIAGNOSIS — Z7901 Long term (current) use of anticoagulants: Secondary | ICD-10-CM

## 2013-08-12 DIAGNOSIS — I4891 Unspecified atrial fibrillation: Secondary | ICD-10-CM

## 2013-08-12 DIAGNOSIS — J984 Other disorders of lung: Secondary | ICD-10-CM | POA: Diagnosis not present

## 2013-08-12 MED ORDER — FUROSEMIDE 20 MG PO TABS: 20.0000 mg | ORAL_TABLET | Freq: Every day | ORAL | Status: DC

## 2013-08-12 NOTE — Progress Notes (Signed)
Patient ID: Randall Odom, male   DOB: 04/17/1926, 80 y.o.   MRN: [id number]    [address], [address], [place]  [address] Phone: [phone] Fax:  [phone]  Date:  08/12/2013   ID:  Randall Odom, DOB [birth year], MRN [id number]  PCP:  Sinclair Grooms, MD   ASSESSMENT:  1. Right upper lobe cavity on CT angiogram. No evidence of pulmonary emboli. 2. Dyspnea, with chronic systolic dysfunction based upon an echo one year ago, not improved with diuretic therapy 3. Atrial fibrillation, continuous for at least one year 4. Chronic anticoagulation, Eliquis  PLAN:  1. Urgent pulmonary consultation is required 2. I will do 24 hour Holter monitor to rule out the possibility of a controlled ventricular response with physical activity 3. Awaiting 2-D echo results 4. Decrease furosemide to 20 mg daily 5. Clinic followup in 6 weeks  SUBJECTIVE: Randall Odom is a 80 y.o. male who returns in followup of exertional dyspnea. I evaluation to this point with a CT angiogram to rule out pulmonary emboli was negative. The CT did demonstrate a right upper lobe cavitating infiltrate approximately 3 cm in diameter. He denies hemoptysis. Review of his records revealed atrial fibrillation and been continuous now for at least a year. Last LVEF showed a value of around 45% greater than a year ago. It is still possible the LV function has deteriorated and could be responsible for some of the dyspnea. We have also not exclude the possibility of uncontrolled ventricular response with physical activity. I had discussion with the family and patient concerning the implications of the lung lesion.   Wt Readings from Last 3 Encounters:  08/12/13 204 lb (92.534 kg)  08/09/13 206 lb (93.441 kg)  02/22/13 199 lb 3.2 oz (90.357 kg)     Past Medical History  Diagnosis Date  . Hypertension   . Muscle spasm   . Diabetes mellitus   . Enlarged prostate   . CAD (coronary artery disease)   . Complete  heart block   . Diarrhea     From C. Diff infection   . Erectile dysfunction   . Low back pain   . Thoracic or lumbosacral neuritis or radiculitis, unspecified   . Depressive disorder, not elsewhere classified   . Carpal tunnel syndrome   . Allergic rhinitis   . Diverticulosis of colon (without mention of hemorrhage)   . Sacroiliitis, not elsewhere classified   . Abdominal pain, epigastric   . Atrial fibrillation with controlled ventricular response   . Recurrent falls   . Gait abnormality   . Dyspnea     ECHO 04/20/12 estimated EF = 40-45%.    . Chronic combined systolic and diastolic CHF (congestive heart failure)   . Intestinal infection due to Clostridium difficile   . Arthritis   . Glaucoma     History of glaucoma  . History of BPH   . DJD (degenerative joint disease)   . Anxiety   . Pure hypercholesterolemia   . Mixed hyperlipidemia     Followed by Dr. Dagmar Hait  . Left atrial enlargement     Moderate by ECHO 04/20/12  . Atrial enlargement, right     Moderate by ECHO 04/20/12  . Bronchitis     Current Outpatient Prescriptions  Medication Sig Dispense Refill  . apixaban (ELIQUIS) 5 MG TABS tablet Take 1 tablet (5 mg total) by mouth 2 (two) times daily.  60 tablet  11  . Cholecalciferol (VITAMIN  D) 2000 UNITS tablet Take 2,000 Units by mouth daily.      . fluticasone (FLONASE) 50 MCG/ACT nasal spray Place 2 sprays into the nose daily as needed for allergies.      . furosemide (LASIX) 40 MG tablet Take 1 tablet (40 mg total) by mouth daily.  30 tablet  11  . Multiple Vitamin (MULTIVITAMIN WITH MINERALS) TABS Take 1 tablet by mouth daily.      . simvastatin (ZOCOR) 40 MG tablet Take 1 tablet (40 mg total) by mouth at bedtime.  30 tablet  1  . terazosin (HYTRIN) 5 MG capsule Take 1 capsule (5 mg total) by mouth at bedtime.  30 capsule  1  . venlafaxine XR (EFFEXOR-XR) 75 MG 24 hr capsule Take 1 capsule (75 mg total) by mouth daily.  30 capsule  1   No current  facility-administered medications for this visit.    Allergies:    Allergies  Allergen Reactions  . Spironolactone Diarrhea and Nausea And Vomiting    Stopped after 2 doses due to vomiting and diarrhea    Social History:  The patient  reports that he quit smoking about 40 years ago. He has never used smokeless tobacco. He reports that he does not drink alcohol or use illicit drugs.   ROS:  Please see the history of present illness.   Appetite is been stable. No chills or fever.   Anticoagulation started without any bleeding complications. All other systems reviewed and negative.   OBJECTIVE: VS:  BP 125/70  Pulse 62  Ht 5\' 10"  (1.778 m)  Wt 204 lb (92.534 kg)  BMI 29.27 kg/m2  SpO2 98% Well nourished, well developed, in no acute distress, elderly HEENT: normal Neck: JVD flat. Carotid bruit absent  Cardiac:  normal S1, S2; RRR; no murmur Lungs:  clear to auscultation bilaterally, no wheezing, rhonchi or rales Abd: soft, nontender, no hepatomegaly Ext: Edema absent. Pulses 2+ Skin: warm and dry Neuro:  CNs 2-12 intact, no focal abnormalities noted  EKG:  Not repeated       Signed, Illene Labrador III, MD 08/12/2013 8:55 AM

## 2013-08-12 NOTE — Patient Instructions (Signed)
Your physician has recommended you make the following change in your medication:  1) DECREASE Lasix to 20mg  daily  You have been referred to Pulmonology (To be scheduled Asap)  Your physician has recommended that you wear a holter monitor. Holter monitors are medical devices that record the heart's electrical activity. Doctors most often use these monitors to diagnose arrhythmias. Arrhythmias are problems with the speed or rhythm of the heartbeat. The monitor is a small, portable device. You can wear one while you do your normal daily activities. This is usually used to diagnose what is causing palpitations/syncope (passing out).  Your physician recommends that you schedule a follow-up appointment in: 6 weeks with Dr.Smith

## 2013-08-13 ENCOUNTER — Encounter: Payer: Self-pay | Admitting: Internal Medicine

## 2013-08-13 ENCOUNTER — Ambulatory Visit (INDEPENDENT_AMBULATORY_CARE_PROVIDER_SITE_OTHER): Payer: Medicare Other | Admitting: Internal Medicine

## 2013-08-13 VITALS — BP 110/74 | HR 60 | Temp 98.0°F | Ht 68.0 in | Wt 205.0 lb

## 2013-08-13 DIAGNOSIS — R9389 Abnormal findings on diagnostic imaging of other specified body structures: Secondary | ICD-10-CM | POA: Diagnosis not present

## 2013-08-13 MED ORDER — AMOXICILLIN-POT CLAVULANATE 875-125 MG PO TABS: 1.0000 | ORAL_TABLET | Freq: Two times a day (BID) | ORAL | Status: DC

## 2013-08-13 NOTE — Progress Notes (Signed)
   Subjective:    Patient ID: Randall Odom, male    DOB: 1926-12-01    MRN: [id number]  HPI  61 yowm quit in 1970 at baseline able to walk a mile s stopping x nl pace s stopping then much starting March 2015 noted worse sob indolent to point where sob x across the house and barely makes it back up from [place] suggesting superior segment mass referred to pulmonary clinic 08/13/2013 by Dr Tamala Julian   08/13/2013 1st [facility] office visit/ Randall Odom  Chief Complaint  Patient presents with  . Pulmonary Consult    Referred per Dr Daneen Schick for eval of abnormal ct chest. Pt c/o SOB and cough for the past several months. Cough is prod with large amounts of white sputum.  He states he gets SOB just walking from room to room.   started having cough after eating and when get up in am, some yellow  Can't sleep on back x years, ok on side  Review of Systems  Constitutional: Negative for fever, chills, activity change, appetite change and unexpected weight change.  HENT: Negative for congestion, dental problem, postnasal drip, rhinorrhea, sneezing, sore throat, trouble swallowing and voice change.   Eyes: Negative for visual disturbance.  Respiratory: Positive for cough and shortness of breath. Negative for choking.   Cardiovascular: Negative for chest pain and leg swelling.  Gastrointestinal: Negative for nausea, vomiting and abdominal pain.  Genitourinary: Negative for difficulty urinating.  Musculoskeletal: Negative for arthralgias.  Skin: Negative for rash.  Psychiatric/Behavioral: Negative for behavioral problems and confusion.       Objective:   Physical Exam  amb wm nad  Wt Readings from Last 3 Encounters:  08/13/13 205 lb (92.987 kg)  08/12/13 204 lb (92.534 kg)  08/09/13 206 lb (93.441 kg)      Edentulous mod obese wm nad  HEENT: nl dentition, turbinates, and orophanx. Nl external ear canals without cough reflex   NECK :  without JVD/Nodes/TM/ nl carotid upstrokes  bilaterally   LUNGS: no acc muscle use, clear to A and P bilaterally without cough on insp or exp maneuvers   CV:  RRR  no s3 or murmur or increase in P2, no edema   ABD:  soft and nontender with nl excursion in the supine position. No bruits or organomegaly, bowel sounds nl  MS:  warm without deformities, calf tenderness, cyanosis or clubbing  SKIN: warm and dry without lesions    NEURO:  alert, approp, no deficits    CTa chest 08/11/13 Area that appears somewhat infiltrative in right lower lobe superior  segment with questionable central cavitation. Question cavitary  pneumonia or possibly cavitary lung mass lesion with surrounding  pneumonitis. My review:  Very very unusual densities in R lung ? Artifact of some kind?     Recent Labs Lab 08/09/13 1552  NA 141  K 4.5  CL 103  CO2 30  BUN 17  CREATININE 1.0  GLUCOSE 119*    Recent Labs Lab 08/09/13 1552  HGB 14.8  14.8  HCT 44.7  WBC 11.8*  PLT 154.0      Lab Results  Component Value Date   PROBNP 71.0 08/09/2013       Assessment & Plan:

## 2013-08-13 NOTE — Assessment & Plan Note (Signed)
Not clear at all what this abnormality is but we can say for sure 1) does not explain his progessive doe, nor his lung dz s/p remote smoking 2) location favors asp, esp with yellow sputum > rec augmentin bid x 14 days   Will f/u in 2 weeks  And regroup then re sob w/u

## 2013-08-13 NOTE — Patient Instructions (Signed)
Augmentin 875 mg take one pill twice daily  X 14 days - take at breakfast and supper with large glass of water.  It would help reduce the usual side effects (diarrhea and yeast infections) if you ate cultured yogurt at lunch.   Please schedule a follow up office visit in 2 weeks, sooner if needed with cxr

## 2013-08-18 ENCOUNTER — Encounter: Payer: Self-pay | Admitting: Interventional Cardiology

## 2013-08-25 ENCOUNTER — Ambulatory Visit (HOSPITAL_COMMUNITY): Payer: Medicare Other | Attending: Interventional Cardiology | Admitting: Radiology

## 2013-08-25 ENCOUNTER — Encounter (INDEPENDENT_AMBULATORY_CARE_PROVIDER_SITE_OTHER): Payer: Medicare Other

## 2013-08-25 ENCOUNTER — Ambulatory Visit (INDEPENDENT_AMBULATORY_CARE_PROVIDER_SITE_OTHER): Payer: Medicare Other | Admitting: *Deleted

## 2013-08-25 ENCOUNTER — Encounter: Payer: Self-pay | Admitting: Radiology

## 2013-08-25 DIAGNOSIS — I2510 Atherosclerotic heart disease of native coronary artery without angina pectoris: Secondary | ICD-10-CM | POA: Insufficient documentation

## 2013-08-25 DIAGNOSIS — Z950 Presence of cardiac pacemaker: Secondary | ICD-10-CM | POA: Diagnosis not present

## 2013-08-25 DIAGNOSIS — E785 Hyperlipidemia, unspecified: Secondary | ICD-10-CM | POA: Insufficient documentation

## 2013-08-25 DIAGNOSIS — I059 Rheumatic mitral valve disease, unspecified: Secondary | ICD-10-CM | POA: Diagnosis not present

## 2013-08-25 DIAGNOSIS — I4891 Unspecified atrial fibrillation: Secondary | ICD-10-CM

## 2013-08-25 DIAGNOSIS — I5042 Chronic combined systolic (congestive) and diastolic (congestive) heart failure: Secondary | ICD-10-CM

## 2013-08-25 DIAGNOSIS — I509 Heart failure, unspecified: Secondary | ICD-10-CM | POA: Insufficient documentation

## 2013-08-25 DIAGNOSIS — I079 Rheumatic tricuspid valve disease, unspecified: Secondary | ICD-10-CM | POA: Insufficient documentation

## 2013-08-25 DIAGNOSIS — E119 Type 2 diabetes mellitus without complications: Secondary | ICD-10-CM | POA: Diagnosis not present

## 2013-08-25 DIAGNOSIS — I10 Essential (primary) hypertension: Secondary | ICD-10-CM | POA: Diagnosis not present

## 2013-08-25 LAB — MDC_IDC_ENUM_SESS_TYPE_INCLINIC
Battery Voltage: 2.72 V
Lead Channel Impedance Value: 443 Ohm
Lead Channel Pacing Threshold Amplitude: 0.75 V
Lead Channel Pacing Threshold Pulse Width: 0.5 ms
Lead Channel Sensing Intrinsic Amplitude: 6.7 mV
Lead Channel Setting Pacing Pulse Width: 0.5 ms
Lead Channel Setting Sensing Sensitivity: 2 mV
MDC IDC MSMT BATTERY IMPEDANCE: 5400 Ohm
MDC IDC PG SERIAL: 1110590
MDC IDC SESS DTM: 20150617174753
MDC IDC STAT BRADY RV PERCENT PACED: 92 %

## 2013-08-25 NOTE — Progress Notes (Signed)
Patient ID: Randall Odom, male   DOB: [birth year], 80 y.o.   MRN: [id number] E cardio 24hr holter applied

## 2013-08-25 NOTE — Progress Notes (Signed)
Echocardiogram performed.  

## 2013-08-25 NOTE — Progress Notes (Signed)
Pacemaker check in clinic. Normal device function. Thresholds, sensing, impedances consistent with previous measurements. Device programmed to maximize longevity. No high ventricular rates noted. Device programmed at appropriate safety margins. Histogram distribution appropriate for patient activity level. Device programmed to optimize intrinsic conduction. Estimated longevity 2.0-2.98yrs. ROV w/ JA in 42mo.

## 2013-08-26 DIAGNOSIS — D485 Neoplasm of uncertain behavior of skin: Secondary | ICD-10-CM | POA: Diagnosis not present

## 2013-08-26 DIAGNOSIS — L820 Inflamed seborrheic keratosis: Secondary | ICD-10-CM | POA: Diagnosis not present

## 2013-08-26 DIAGNOSIS — D234 Other benign neoplasm of skin of scalp and neck: Secondary | ICD-10-CM | POA: Diagnosis not present

## 2013-08-27 ENCOUNTER — Ambulatory Visit (INDEPENDENT_AMBULATORY_CARE_PROVIDER_SITE_OTHER)
Admission: RE | Admit: 2013-08-27 | Discharge: 2013-08-27 | Disposition: A | Discharge status: Home / Self Care | Payer: Medicare Other | Source: Ambulatory Visit | Attending: Internal Medicine | Admitting: Internal Medicine

## 2013-08-27 ENCOUNTER — Encounter: Payer: Self-pay | Admitting: Internal Medicine

## 2013-08-27 ENCOUNTER — Ambulatory Visit (INDEPENDENT_AMBULATORY_CARE_PROVIDER_SITE_OTHER): Payer: Medicare Other | Admitting: Internal Medicine

## 2013-08-27 VITALS — BP 112/78 | HR 60 | Temp 97.6°F | Ht 68.0 in | Wt 210.0 lb

## 2013-08-27 DIAGNOSIS — R0609 Other forms of dyspnea: Secondary | ICD-10-CM | POA: Diagnosis not present

## 2013-08-27 DIAGNOSIS — R0989 Other specified symptoms and signs involving the circulatory and respiratory systems: Secondary | ICD-10-CM

## 2013-08-27 DIAGNOSIS — I10 Essential (primary) hypertension: Secondary | ICD-10-CM

## 2013-08-27 DIAGNOSIS — R9389 Abnormal findings on diagnostic imaging of other specified body structures: Secondary | ICD-10-CM | POA: Diagnosis not present

## 2013-08-27 DIAGNOSIS — R0600 Dyspnea, unspecified: Secondary | ICD-10-CM

## 2013-08-27 DIAGNOSIS — J438 Other emphysema: Secondary | ICD-10-CM | POA: Diagnosis not present

## 2013-08-27 NOTE — Patient Instructions (Addendum)
If the cough starts to bother you,  We may need you to try a different blood pressure pill   We do need to set you up for a CT scan no contrast in 6 weeks (tickle file done)  No pulmonary office visit needed - we will call you with results and decide what else if anything is needed

## 2013-08-27 NOTE — Progress Notes (Signed)
Subjective:    Patient ID: Randall Odom, male    DOB: Jun 15, 1926    MRN: [id number]    Brief patient profile:  87 yowm quit in 1970 at baseline able to walk a mile s stopping x nl pace s stopping then indolent onset starting March 2015 noted worse sob  to point where sob x across the house and barely makes it back up from [place] suggesting superior segment mass referred to pulmonary clinic 08/13/2013 by Dr Tamala Julian   History of Present Illness  08/13/2013 1st [facility] office visit/ Randall Odom  Chief Complaint  Patient presents with  . Pulmonary Consult    Referred per Dr Daneen Schick for eval of abnormal ct chest. Pt c/o SOB and cough for the past several months. Cough is prod with large amounts of white sputum.  He states he gets SOB just walking from room to room.   started having cough after eating and when get up in am, some yellow  Can't sleep on back x years, ok on side rec Augmentin 875 mg take one pill twice daily  X 14 days - take at breakfast and supper with large glass of water.  It would help reduce the usual side effects (diarrhea and yeast infections) if you ate cultured yogurt at lunch.    08/27/2013 f/u ov/Randall Odom re:  Chief Complaint  Patient presents with  . Follow-up    Pt states that breahting has been doing well since last OV. CXR today.      Not limited by breathing from desired activities  Including now walking back from mailbox ok   No obvious day to day or daytime variabilty or assoc chronic cough or cp or chest tightness, subjective wheeze overt sinus or hb symptoms. No unusual exp hx or h/o childhood pna/ asthma or knowledge of premature birth.  Sleeping ok without nocturnal  or early am exacerbation  of respiratory  c/o's or need for noct saba. Also denies any obvious fluctuation of symptoms with weather or environmental changes or other aggravating or alleviating factors except as outlined above   Current Medications, Allergies, Complete Past Medical  History, Past Surgical History, Family History, and Social History were reviewed in [employer] record.  ROS  The following are not active complaints unless bolded sore throat, dysphagia, dental problems, itching, sneezing,  nasal congestion or excess/ purulent secretions, ear ache,   fever, chills, sweats, unintended wt loss, pleuritic or exertional cp, hemoptysis,  orthopnea pnd or leg swelling, presyncope, palpitations, heartburn, abdominal pain, anorexia, nausea, vomiting, diarrhea  or change in bowel or urinary habits, change in stools or urine, dysuria,hematuria,  rash, arthralgias, visual complaints, headache, numbness weakness or ataxia or problems with walking or coordination,  change in mood/affect or memory.             Objective:   Physical Exam  amb wm nad  08/27/13            210  Wt Readings from Last 3 Encounters:  08/13/13 205 lb (92.987 kg)  08/12/13 204 lb (92.534 kg)  08/09/13 206 lb (93.441 kg)      Edentulous mod obese wm nad  HEENT: nl dentition, turbinates, and orophanx. Nl external ear canals without cough reflex   NECK :  without JVD/Nodes/TM/ nl carotid upstrokes bilaterally   LUNGS: no acc muscle use, clear to A and P bilaterally without cough on insp or exp maneuvers   CV:  RRR  no  s3 or murmur or increase in P2, no edema   ABD:  soft and nontender with nl excursion in the supine position. No bruits or organomegaly, bowel sounds nl  MS:  warm without deformities, calf tenderness, cyanosis or clubbing  SKIN: warm and dry without lesions    NEURO:  alert, approp, no deficits    CTa chest 08/11/13 Area that appears somewhat infiltrative in right lower lobe superior  segment with questionable central cavitation. Question cavitary  pneumonia or possibly cavitary lung mass lesion with surrounding  pneumonitis. My review:  Very very unusual densities in R lung ? Artifact of some kind?   CXR  08/27/2013 :  The previously  noted cavitary lesion in the right lower lobe is felt to be present but significantly less well seen on this study compared to CT.      Recent Labs Lab 08/09/13 1552  NA 141  K 4.5  CL 103  CO2 30  BUN 17  CREATININE 1.0  GLUCOSE 119*    Recent Labs Lab 08/09/13 1552  HGB 14.8  14.8  HCT 44.7  WBC 11.8*  PLT 154.0      Lab Results  Component Value Date   PROBNP 71.0 08/09/2013       Assessment & Plan:

## 2013-08-30 ENCOUNTER — Telehealth: Payer: Self-pay

## 2013-08-30 NOTE — Telephone Encounter (Signed)
pt wife aware or echo results.Heart size and function are normal.pt wife verbalized understanding.

## 2013-08-30 NOTE — Assessment & Plan Note (Signed)
May need to consider trial off acei if atypica sob symptoms recur but for now ok to continue  See instructions for specific recommendations which were reviewed directly with the patient who was given a copy with highlighter outlining the key components.

## 2013-08-30 NOTE — Assessment & Plan Note (Signed)
See CT chest 08/11/13 very unusual densities ? Artifact - assoc with yellow mucus prod 08/13/2013 > rx x 14 d augmentin> resolved mucus > CT Chest for late July 2015  in tickle file  His cxr is normal now but it would be easy to miss a small cavity RLL medically by pa and lateral so best to get apples to apples comparison CT - if this was lung abscess we would not expect to see complete healing now and maybe not in 6 more weeks but should def see regression by then or needs T surgery eval.  Discussed in detail all the  indications, usual  risks and alternatives  relative to the benefits with patient who agrees to proceed with conservative f/u as outlined.

## 2013-08-30 NOTE — Assessment & Plan Note (Addendum)
Resolved to his satisfaction though very unlikley related to his pulmonary status or ? Of lung abscess

## 2013-08-30 NOTE — Telephone Encounter (Signed)
Message copied by Lamar Laundry on Mon Aug 30, 2013  9:15 AM ------      Message from: Daneen Schick      Created: Fri Aug 27, 2013  4:58 PM       Heart size and function are normal. ------

## 2013-08-31 ENCOUNTER — Telehealth: Payer: Self-pay

## 2013-08-31 NOTE — Telephone Encounter (Signed)
pt and pt wife aware of holter monitor results. afib 100% no tachy.pt and pt wife verbalized understanding.

## 2013-09-15 ENCOUNTER — Telehealth: Payer: Self-pay | Admitting: *Deleted

## 2013-09-15 ENCOUNTER — Encounter: Payer: Self-pay | Admitting: Internal Medicine

## 2013-09-15 DIAGNOSIS — R9389 Abnormal findings on diagnostic imaging of other specified body structures: Secondary | ICD-10-CM

## 2013-09-15 NOTE — Telephone Encounter (Signed)
Message copied by Rosana Berger on Wed Sep 15, 2013  3:39 PM ------      Message from: Christinia Gully B      Created: Fri Aug 27, 2013  9:59 AM       Ct no contrast due f/u cavity in RLL  ------

## 2013-09-21 ENCOUNTER — Encounter: Payer: Self-pay | Admitting: Internal Medicine

## 2013-09-21 ENCOUNTER — Ambulatory Visit (INDEPENDENT_AMBULATORY_CARE_PROVIDER_SITE_OTHER)
Admission: RE | Admit: 2013-09-21 | Discharge: 2013-09-21 | Disposition: A | Discharge status: Home / Self Care | Payer: Medicare Other | Source: Ambulatory Visit | Attending: Internal Medicine | Admitting: Internal Medicine

## 2013-09-21 DIAGNOSIS — R222 Localized swelling, mass and lump, trunk: Secondary | ICD-10-CM | POA: Diagnosis not present

## 2013-09-21 DIAGNOSIS — R9389 Abnormal findings on diagnostic imaging of other specified body structures: Secondary | ICD-10-CM | POA: Diagnosis not present

## 2013-09-22 ENCOUNTER — Encounter: Payer: Self-pay | Admitting: *Deleted

## 2013-09-22 ENCOUNTER — Other Ambulatory Visit: Payer: Self-pay | Admitting: Internal Medicine

## 2013-09-22 ENCOUNTER — Ambulatory Visit: Payer: Medicare Other | Admitting: Interventional Cardiology

## 2013-09-22 DIAGNOSIS — R9389 Abnormal findings on diagnostic imaging of other specified body structures: Secondary | ICD-10-CM

## 2013-09-22 NOTE — Progress Notes (Signed)
Quick Note:  Spoke with pt and notified of results per Dr. Wert. Pt verbalized understanding and denied any questions.  ______ 

## 2013-09-30 ENCOUNTER — Encounter (HOSPITAL_COMMUNITY): Payer: Self-pay

## 2013-09-30 ENCOUNTER — Encounter: Payer: Self-pay | Admitting: Internal Medicine

## 2013-09-30 ENCOUNTER — Ambulatory Visit (HOSPITAL_COMMUNITY)
Admission: RE | Admit: 2013-09-30 | Discharge: 2013-09-30 | Disposition: A | Discharge status: Home / Self Care | Payer: Medicare Other | Source: Ambulatory Visit | Attending: Internal Medicine | Admitting: Internal Medicine

## 2013-09-30 DIAGNOSIS — J438 Other emphysema: Secondary | ICD-10-CM | POA: Diagnosis not present

## 2013-09-30 DIAGNOSIS — J984 Other disorders of lung: Secondary | ICD-10-CM | POA: Insufficient documentation

## 2013-09-30 DIAGNOSIS — R9389 Abnormal findings on diagnostic imaging of other specified body structures: Secondary | ICD-10-CM

## 2013-09-30 LAB — GLUCOSE, CAPILLARY: Glucose-Capillary: 153 mg/dL — ABNORMAL HIGH (ref 70–99)

## 2013-09-30 MED ORDER — FLUDEOXYGLUCOSE F - 18 (FDG) INJECTION: 11.0000 | Freq: Once | INTRAVENOUS | Status: AC | PRN

## 2013-09-30 NOTE — Progress Notes (Signed)
Quick Note:  Spoke with pt and notified of results per Dr. Wert. Pt verbalized understanding and denied any questions.  ______ 

## 2013-10-11 ENCOUNTER — Encounter: Payer: Self-pay | Admitting: Interventional Cardiology

## 2013-10-11 ENCOUNTER — Ambulatory Visit (INDEPENDENT_AMBULATORY_CARE_PROVIDER_SITE_OTHER): Payer: Medicare Other | Admitting: Interventional Cardiology

## 2013-10-11 VITALS — BP 117/67 | HR 77 | Ht 68.0 in | Wt 209.4 lb

## 2013-10-11 DIAGNOSIS — I10 Essential (primary) hypertension: Secondary | ICD-10-CM | POA: Diagnosis not present

## 2013-10-11 DIAGNOSIS — I2510 Atherosclerotic heart disease of native coronary artery without angina pectoris: Secondary | ICD-10-CM

## 2013-10-11 DIAGNOSIS — R0989 Other specified symptoms and signs involving the circulatory and respiratory systems: Secondary | ICD-10-CM | POA: Diagnosis not present

## 2013-10-11 DIAGNOSIS — R0609 Other forms of dyspnea: Secondary | ICD-10-CM | POA: Diagnosis not present

## 2013-10-11 DIAGNOSIS — Z950 Presence of cardiac pacemaker: Secondary | ICD-10-CM | POA: Diagnosis not present

## 2013-10-11 DIAGNOSIS — I5042 Chronic combined systolic (congestive) and diastolic (congestive) heart failure: Secondary | ICD-10-CM

## 2013-10-11 DIAGNOSIS — R0600 Dyspnea, unspecified: Secondary | ICD-10-CM

## 2013-10-11 DIAGNOSIS — I4891 Unspecified atrial fibrillation: Secondary | ICD-10-CM

## 2013-10-11 DIAGNOSIS — I482 Chronic atrial fibrillation, unspecified: Secondary | ICD-10-CM

## 2013-10-11 NOTE — Patient Instructions (Signed)
Your physician recommends that you continue on your current medications as directed. Please refer to the Current Medication list given to you today.  Your physician has requested that you have a lexiscan myoview. For further information please visit [email or web address]. Please follow instruction sheet, as given.  Your physician wants you to follow-up in: 6 months with Dr Gaspar Bidding will receive a reminder letter in the mail two months in advance. If you don't receive a letter, please call our office to schedule the follow-up appointment.

## 2013-10-11 NOTE — Progress Notes (Signed)
Patient ID: [name], male   DOB: 1927/02/09, 80 y.o.   MRN: [id number]    [address] [place], [place]  [address] Phone: [phone] Fax:  [phone]  Date:  10/11/2013   ID:  Randall Odom, DOB 07-31-1926, MRN [id number]  PCP:  Sinclair Grooms, MD   ASSESSMENT:  1. Coronary artery disease, been noted by three-vessel calcification on CT scan. No overt angina but dyspnea may be an anginal equivalent 2. Exertional dyspnea 3. Chronic combined systolic and diastolic heart failure 4. Chronic atrial fibrillation 5. Chronic 6. 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 Since no obvious pulmonary cause for dyspnea was found, we now need to exclude the possibility that exertional dyspnea as an anginal equivalent. He will therefore have a pharmacologic stress myocardial perfusion study to rule out significant obstructive CAD 2. A detailed conversation about aggressiveness of therapy at his age. He would not refuse even surgical therapy of fall for. 3. No change in medical regimen 4. Clinical followup in 6 months. Earlier if concern is raised by the myocardial perfusion study   SUBJECTIVE: Randall Odom is a 80 y.o. male who is still troubled by exertional dyspnea. He denies orthopnea, PND, and dyspnea at rest. He has not had chest pain compatible with angina. The dyspnea has been progressive over the past several months. LV function is slightly diminished at 40% EF. Pulmonary evaluation did not identify significant abnormalities other than a cavitary right lower lobe lesion   Wt Readings from Last 3 Encounters:  10/11/13 209 lb 6.4 oz (94.983 kg)  08/27/13 210 lb (95.255 kg)  08/13/13 205 lb (92.987 kg)     Past Medical History  Diagnosis Date  . Hypertension   . Muscle spasm   . Diabetes mellitus   . Enlarged prostate   . CAD (coronary artery disease)   . Complete heart block   . Diarrhea     From C. Diff infection   .  Erectile dysfunction   . Low back pain   . Thoracic or lumbosacral neuritis or radiculitis, unspecified   . Depressive disorder, not elsewhere classified   . Carpal tunnel syndrome   . Allergic rhinitis   . Diverticulosis of colon (without mention of hemorrhage)   . Sacroiliitis, not elsewhere classified   . Abdominal pain, epigastric   . Atrial fibrillation with controlled ventricular response   . Recurrent falls   . Gait abnormality   . Dyspnea     ECHO 04/20/12 estimated EF = 40-45%.    . Chronic combined systolic and diastolic CHF (congestive heart failure)   . Intestinal infection due to Clostridium difficile   . Arthritis   . Glaucoma     History of glaucoma  . History of BPH   . DJD (degenerative joint disease)   . Anxiety   . Pure hypercholesterolemia   . Mixed hyperlipidemia     Followed by Dr. Dagmar Hait  . Left atrial enlargement     Moderate by ECHO 04/20/12  . Atrial enlargement, right     Moderate by ECHO 04/20/12  . Bronchitis     Current Outpatient Prescriptions  Medication Sig Dispense Refill  . apixaban (ELIQUIS) 5 MG TABS tablet Take 1 tablet (5 mg total) by mouth 2 (two) times daily.  60 tablet  11  . Cholecalciferol (VITAMIN D) 2000 UNITS tablet Take 2,000 Units by mouth daily.      Marland Kitchen  esomeprazole (NEXIUM) 40 MG capsule Take 40 mg by mouth daily at 12 noon.      . fluticasone (FLONASE) 50 MCG/ACT nasal spray Place 2 sprays into the nose daily as needed for allergies.      . furosemide (LASIX) 20 MG tablet Take 1 tablet (20 mg total) by mouth daily.      Marland Kitchen glipiZIDE (GLUCOTROL) 5 MG tablet Take 5 mg by mouth daily before breakfast.      . lisinopril (PRINIVIL,ZESTRIL) 5 MG tablet Take 5 mg by mouth daily.      . Multiple Vitamin (MULTIVITAMIN WITH MINERALS) TABS Take 1 tablet by mouth daily.      . simvastatin (ZOCOR) 40 MG tablet Take 1 tablet (40 mg total) by mouth at bedtime.  30 tablet  1  . terazosin (HYTRIN) 5 MG capsule Take 1 capsule (5 mg total) by mouth  at bedtime.  30 capsule  1   No current facility-administered medications for this visit.    Allergies:    Allergies  Allergen Reactions  . Spironolactone Diarrhea and Nausea And Vomiting    Stopped after 2 doses due to vomiting and diarrhea    Social History:  The patient  reports that he quit smoking about 45 years ago. His smoking use included Cigarettes. He has a 37.5 pack-year smoking history. He has never used smokeless tobacco. He reports that he does not drink alcohol or use illicit drugs.   ROS:  Please see the history of present illness.   Appetite and weight are stable   All other systems reviewed and negative.   OBJECTIVE: VS:  BP 117/67  Pulse 77  Ht 5\' 8"  (1.727 m)  Wt 209 lb 6.4 oz (94.983 kg)  BMI 31.85 kg/m2 Well nourished, well developed, in no acute distress, elderly HEENT: normal Neck: JVD flat. Carotid bruit absent  Cardiac:  normal S1, S2; RRR; no murmur Lungs:  clear to auscultation bilaterally, no wheezing, rhonchi or rales Abd: soft, nontender, no hepatomegaly Ext: Edema absent. Pulses 2+ and symmetric Skin: warm and dry Neuro:  CNs 2-12 intact, no focal abnormalities noted  EKG:  Not repeated       Signed, Illene Labrador III, MD 10/11/2013 11:34 AM

## 2013-10-13 ENCOUNTER — Ambulatory Visit (HOSPITAL_COMMUNITY): Payer: Medicare Other | Attending: Cardiovascular Disease | Admitting: Radiology

## 2013-10-13 VITALS — BP 120/61 | HR 60 | Ht 68.0 in | Wt 207.0 lb

## 2013-10-13 DIAGNOSIS — R0600 Dyspnea, unspecified: Secondary | ICD-10-CM

## 2013-10-13 DIAGNOSIS — R0609 Other forms of dyspnea: Secondary | ICD-10-CM | POA: Diagnosis not present

## 2013-10-13 DIAGNOSIS — I2510 Atherosclerotic heart disease of native coronary artery without angina pectoris: Secondary | ICD-10-CM | POA: Diagnosis not present

## 2013-10-13 DIAGNOSIS — I252 Old myocardial infarction: Secondary | ICD-10-CM | POA: Insufficient documentation

## 2013-10-13 DIAGNOSIS — R0989 Other specified symptoms and signs involving the circulatory and respiratory systems: Secondary | ICD-10-CM | POA: Insufficient documentation

## 2013-10-13 DIAGNOSIS — R0602 Shortness of breath: Secondary | ICD-10-CM | POA: Diagnosis not present

## 2013-10-13 MED ORDER — TECHNETIUM TC 99M SESTAMIBI GENERIC - CARDIOLITE
30.0000 | Freq: Once | INTRAVENOUS | Status: AC | PRN
  Administered 2013-10-13: 30 via INTRAVENOUS

## 2013-10-13 MED ORDER — TECHNETIUM TC 99M SESTAMIBI GENERIC - CARDIOLITE
10.0000 | Freq: Once | INTRAVENOUS | Status: AC | PRN
  Administered 2013-10-13: 10 via INTRAVENOUS

## 2013-10-13 MED ORDER — ADENOSINE (DIAGNOSTIC) 3 MG/ML IV SOLN
0.5600 mg/kg | Freq: Once | INTRAVENOUS | Status: AC
  Administered 2013-10-13: 52.5 mg via INTRAVENOUS

## 2013-10-13 NOTE — Progress Notes (Signed)
[facility] SITE [address] NUCLEAR [facility] [address] [place], [place] [address] [phone]    Cardiology Nuclear Med Study  SAW MENDENHALL is a 80 y.o. male     MRN : [id number]     DOB: 11-Apr-1926  Procedure Date: 10/13/2013  Nuclear Med Background Indication for Stress Test:  Evaluation for Ischemia History:  CAD, PTVP, Afib, Echo 2015 EF 50-55%, Cardiac CT (3V calcification) Cardiac Risk Factors: Lipids and NIDDM  Symptoms:  DOE   Nuclear Pre-Procedure Caffeine/Decaff Intake:  None NPO After: 6 pm   Lungs:  clear O2 Sat: 97% on room air. IV 0.9% NS with Angio Cath:  22g  IV Site: R Hand  IV Started by:  Crissie Figures, RN  Chest Size (in):  48 Cup Size: n/a  Height: 5\' 8"  (1.727 m)  Weight:  207 lb (93.895 kg)  BMI:  Body mass index is 31.48 kg/(m^2). Tech Comments:  N/A    Nuclear Med Study 1 or 2 day study: 1 day  Stress Test Type:  Lexiscan  Reading MD: N/A  Order Authorizing Provider:  Daneen Schick, MD  Resting Radionuclide: Technetium 76m Sestamibi  Resting Radionuclide Dose: 11.0 mCi   Stress Radionuclide:  Technetium 88m Sestamibi  Stress Radionuclide Dose: 33.0 mCi           Stress Protocol Rest HR: 60 Stress HR: 67  Rest BP: 120/61 Stress BP: 109/56  Exercise Time (min): n/a METS: n/a           Dose of Adenosine (mg):  52.7 mg Dose of Lexiscan: n/a mg  Dose of Atropine (mg): n/a Dose of Dobutamine: n/a mcg/kg/min (at max HR)  Stress Test Technologist: Glade Lloyd, BS-ES  Nuclear Technologist:  Vedia Pereyra, CNMT     Rest Procedure:  Myocardial perfusion imaging was performed at rest 45 minutes following the intravenous administration of Technetium 69m Sestamibi. Rest ECG: v-paced  Stress Procedure:  The patient received IV adenosine at 140 mcg/kg/min for 4 minutes.  Technetium 1m Sestamibi was injected at the 2 minute mark and quantitative spect images were obtained after a 45 minute delay. Stress ECG: No significant change from baseline  ECG  QPS Raw Data Images:  Normal; no motion artifact; normal heart/lung ratio. Stress Images:  Medium-sized, moderate inferior and inferoseptal perfusion defect. Rest Images:  Medium-sized, moderate inferior and inferoseptal perfusion defect Subtraction (SDS):  Fixed, medium-sized, moderate inferior and inferoseptal perfusion defect Transient Ischemic Dilatation (Normal <1.22):  1.12 Lung/Heart Ratio (Normal <0.45):  0.28  Quantitative Gated Spect Images QGS EDV:  106 ml QGS ESV:  55 ml  Impression Exercise Capacity:  Adenosine study with no exercise. BP Response:  Normal blood pressure response. Clinical Symptoms:  No symptoms. ECG Impression:  v-paced Comparison with Prior Nuclear Study: No images to compare  Overall Impression:  Low risk stress nuclear study with a medium-sized, moderate inferior and inferoseptal perfusion defect.  EF is 48% but hypokinesis appears global without regionality.  Possible prior inferior/inferoseptal infarction, no evidence for ischemia. .  LV Ejection Fraction: 48%.             LV Wall Motion:  Mild global hypokinesis.   Randall Odom 10/13/2013   .

## 2013-10-15 ENCOUNTER — Telehealth: Payer: Self-pay

## 2013-10-15 NOTE — Telephone Encounter (Signed)
Message copied by Lamar Laundry on Fri Oct 15, 2013  2:25 PM ------      Message from: Daneen Schick      Created: Thu Oct 14, 2013  1:32 PM       There is evidence of an old heart attack but no active ischemia. Study is low to moderate risk.at 83 I would recommend conservative medical management ------

## 2013-10-15 NOTE — Telephone Encounter (Signed)
called to give pt myoview results.unable to lmom pt phone rings out.

## 2013-10-19 DIAGNOSIS — J309 Allergic rhinitis, unspecified: Secondary | ICD-10-CM | POA: Diagnosis not present

## 2013-10-19 DIAGNOSIS — Z6830 Body mass index (BMI) 30.0-30.9, adult: Secondary | ICD-10-CM | POA: Diagnosis not present

## 2013-10-19 DIAGNOSIS — I4891 Unspecified atrial fibrillation: Secondary | ICD-10-CM | POA: Diagnosis not present

## 2013-10-19 DIAGNOSIS — E1139 Type 2 diabetes mellitus with other diabetic ophthalmic complication: Secondary | ICD-10-CM | POA: Diagnosis not present

## 2013-10-19 DIAGNOSIS — E785 Hyperlipidemia, unspecified: Secondary | ICD-10-CM | POA: Diagnosis not present

## 2013-10-19 DIAGNOSIS — I2510 Atherosclerotic heart disease of native coronary artery without angina pectoris: Secondary | ICD-10-CM | POA: Diagnosis not present

## 2013-10-19 DIAGNOSIS — I10 Essential (primary) hypertension: Secondary | ICD-10-CM | POA: Diagnosis not present

## 2013-12-24 DIAGNOSIS — Z23 Encounter for immunization: Secondary | ICD-10-CM | POA: Diagnosis not present

## 2014-02-18 DIAGNOSIS — E119 Type 2 diabetes mellitus without complications: Secondary | ICD-10-CM | POA: Diagnosis not present

## 2014-02-18 DIAGNOSIS — F419 Anxiety disorder, unspecified: Secondary | ICD-10-CM | POA: Diagnosis not present

## 2014-02-18 DIAGNOSIS — Z23 Encounter for immunization: Secondary | ICD-10-CM | POA: Diagnosis not present

## 2014-02-18 DIAGNOSIS — R918 Other nonspecific abnormal finding of lung field: Secondary | ICD-10-CM | POA: Diagnosis not present

## 2014-02-18 DIAGNOSIS — I10 Essential (primary) hypertension: Secondary | ICD-10-CM | POA: Diagnosis not present

## 2014-02-18 DIAGNOSIS — I2510 Atherosclerotic heart disease of native coronary artery without angina pectoris: Secondary | ICD-10-CM | POA: Diagnosis not present

## 2014-02-18 DIAGNOSIS — Z0000 Encounter for general adult medical examination without abnormal findings: Secondary | ICD-10-CM | POA: Diagnosis not present

## 2014-02-18 DIAGNOSIS — I480 Paroxysmal atrial fibrillation: Secondary | ICD-10-CM | POA: Diagnosis not present

## 2014-02-18 DIAGNOSIS — J309 Allergic rhinitis, unspecified: Secondary | ICD-10-CM | POA: Diagnosis not present

## 2014-03-02 ENCOUNTER — Encounter: Payer: Self-pay | Admitting: Internal Medicine

## 2014-03-02 ENCOUNTER — Ambulatory Visit (INDEPENDENT_AMBULATORY_CARE_PROVIDER_SITE_OTHER): Payer: Medicare Other | Admitting: Internal Medicine

## 2014-03-02 VITALS — BP 118/68 | HR 62 | Ht 70.0 in | Wt 209.0 lb

## 2014-03-02 DIAGNOSIS — I482 Chronic atrial fibrillation, unspecified: Secondary | ICD-10-CM

## 2014-03-02 DIAGNOSIS — Z950 Presence of cardiac pacemaker: Secondary | ICD-10-CM

## 2014-03-02 DIAGNOSIS — I5042 Chronic combined systolic (congestive) and diastolic (congestive) heart failure: Secondary | ICD-10-CM

## 2014-03-02 DIAGNOSIS — I2510 Atherosclerotic heart disease of native coronary artery without angina pectoris: Secondary | ICD-10-CM

## 2014-03-02 DIAGNOSIS — R55 Syncope and collapse: Secondary | ICD-10-CM

## 2014-03-02 DIAGNOSIS — I442 Atrioventricular block, complete: Secondary | ICD-10-CM | POA: Diagnosis not present

## 2014-03-02 DIAGNOSIS — R0600 Dyspnea, unspecified: Secondary | ICD-10-CM

## 2014-03-02 LAB — MDC_IDC_ENUM_SESS_TYPE_INCLINIC
Battery Impedance: 7000 Ohm
Battery Voltage: 2.72 V
Implantable Pulse Generator Model: 5386
Lead Channel Pacing Threshold Amplitude: 1 V
Lead Channel Pacing Threshold Pulse Width: 0.5 ms
Lead Channel Sensing Intrinsic Amplitude: 9.3 mV
Lead Channel Setting Pacing Pulse Width: 0.5 ms
MDC IDC MSMT LEADCHNL RV IMPEDANCE VALUE: 485 Ohm
MDC IDC PG SERIAL: 1110590
MDC IDC SESS DTM: 20151223105317
MDC IDC SET LEADCHNL RV SENSING SENSITIVITY: 2 mV

## 2014-03-02 NOTE — Progress Notes (Signed)
Tivis Ringer, MD is primary Cardiologist:  Randall Odom is a 80 y.o. male with a h/o complete heart block sp PPM (SJM) by Dr Leonia Reeves who presents today for follow-up in the Electrophysiology device clinic.  He initially underwent PPM in 1989.  He underwent upgrade to dual chamber device 09/28/1997.  His most recent generator replacement was 12/06/2004.  He has atrial fibrillation and is presently anticoagulated with eliquis.  He has SOB with moderate activity which is stable.  He was evaluated by Dr Tamala Julian and had a myoview earlier this year which revealed primarily infarct rather than ischemia.  Medical therapy was advised.   Today, he  denies symptoms of palpitations, chest pain,  orthopnea, PND, lower extremity edema, dizziness, presyncope, syncope, or neurologic sequela.  The patientis tolerating medications without difficulties and is otherwise without complaint today.   Past Medical History  Diagnosis Date  . Hypertension   . Muscle spasm   . Diabetes mellitus   . Enlarged prostate   . CAD (coronary artery disease)   . Complete heart block   . Diarrhea     From C. Diff infection   . Erectile dysfunction   . Low back pain   . Thoracic or lumbosacral neuritis or radiculitis, unspecified   . Depressive disorder, not elsewhere classified   . Carpal tunnel syndrome   . Allergic rhinitis   . Diverticulosis of colon (without mention of hemorrhage)   . Sacroiliitis, not elsewhere classified   . Abdominal pain, epigastric   . Atrial fibrillation with controlled ventricular response   . Recurrent falls   . Gait abnormality   . Dyspnea     ECHO 04/20/12 estimated EF = 40-45%.    . Chronic combined systolic and diastolic CHF (congestive heart failure)   . Intestinal infection due to Clostridium difficile   . Arthritis   . Glaucoma     History of glaucoma  . History of BPH   . DJD (degenerative joint disease)   . Anxiety   . Pure hypercholesterolemia   . Mixed hyperlipidemia       Followed by Dr. Dagmar Hait  . Left atrial enlargement     Moderate by ECHO 04/20/12  . Atrial enlargement, right     Moderate by ECHO 04/20/12  . Bronchitis    Past Surgical History  Procedure Laterality Date  . Pacemaker insertion  09/28/1997    DDD for high-grade AV Block. Had an upgrade to a dual chamber device on 09/09/97. Replaced dual chamber battery on 12/06/04. New device is a Financial risk analyst Identity ADX XL DR, model [id number], seriel [id number].  Marland Kitchen Appendectomy  1966  . Tonsillectomy  1953    History   Social History  . Marital Status: Married    Spouse Name: N/A    Number of Children: N/A  . Years of Education: N/A   Occupational History  . Retired    Social History Main Topics  . Smoking status: Former Smoker -- 1.50 packs/day for 25 years    Types: Cigarettes    Quit date: 03/11/1968  . Smokeless tobacco: Never Used  . Alcohol Use: No  . Drug Use: No  . Sexual Activity: Not on file   Other Topics Concern  . Not on file   Social History Narrative    Family History  Problem Relation Age of Onset  . Cancer Mother     breast  . CVA Father   . Anemia Brother   . Heart disease  Brother     Allergies  Allergen Reactions  . Spironolactone Diarrhea and Nausea And Vomiting    Stopped after 2 doses due to vomiting and diarrhea    Current Outpatient Prescriptions  Medication Sig Dispense Refill  . apixaban (ELIQUIS) 5 MG TABS tablet Take 1 tablet (5 mg total) by mouth 2 (two) times daily. 60 tablet 11  . Cholecalciferol (VITAMIN D) 2000 UNITS tablet Take 2,000 Units by mouth daily.    Marland Kitchen esomeprazole (NEXIUM) 40 MG capsule Take 40 mg by mouth daily at 12 noon.    . fluticasone (FLONASE) 50 MCG/ACT nasal spray Place 2 sprays into the nose daily as needed for allergies.    . furosemide (LASIX) 40 MG tablet Take 20 mg by mouth daily.  11  . glipiZIDE (GLUCOTROL) 5 MG tablet Take 5 mg by mouth daily before breakfast.    . lisinopril (PRINIVIL,ZESTRIL) 5 MG tablet Take 5 mg by  mouth daily.    . Multiple Vitamin (MULTIVITAMIN WITH MINERALS) TABS Take 1 tablet by mouth daily.    . simvastatin (ZOCOR) 40 MG tablet Take 1 tablet (40 mg total) by mouth at bedtime. 30 tablet 1  . terazosin (HYTRIN) 5 MG capsule Take 1 capsule (5 mg total) by mouth at bedtime. 30 capsule 1   No current facility-administered medications for this visit.    ROS- all systems are reviewed and negative except as per HPI  Physical Exam: Filed Vitals:   03/02/14 0946  BP: 118/68  Pulse: 62  Height: 5\' 10"  (1.778 m)  Weight: 209 lb (94.802 kg)    GEN- The patient is elderly appearing, alert and oriented x 3 today.   Head- normocephalic, atraumatic Eyes-  Sclera clear, conjunctiva pink Ears- hearing intact Oropharynx- clear Neck- supple  Lungs- Clear to ausculation bilaterally, normal work of breathing Chest- pacemaker pocket is well healed Heart- Regular rate and rhythm (paced) GI- soft, NT, ND, + BS Extremities- no clubbing, cyanosis, + edema MS- no significant deformity or atrophy Skin- no rash or lesion Psych- euthymic mood, full affect Neuro- strength and sensation are intact  Pacemaker interrogation- reviewed in detail today,  See PACEART report  Assessment and Plan:  1. Complete heart block Normal pacemaker function See Pace Art report  2. Permanent afib Doing well with eliquis Rate controlled  3. Chronic systolic dysfunction EF 05% Compensated presently No changes today  4. SOB Likely mulifactoral He is not very active I have increased his pacemaker sensor from a slope of 12 to 13 and have made sensory response very fast with fast recovery. We ambulated him in the hall and he feels "better"   I will see again in 1 year  Thompson Grayer MD 03/02/2014 11:03 AM

## 2014-03-02 NOTE — Patient Instructions (Addendum)
Your physician wants you to follow-up in: 12 months with Dr Allred You will receive a reminder letter in the mail two months in advance. If you don't receive a letter, please call our office to schedule the follow-up appointment.  

## 2014-03-08 ENCOUNTER — Encounter: Payer: Self-pay | Admitting: Internal Medicine

## 2014-03-14 ENCOUNTER — Other Ambulatory Visit: Payer: Self-pay | Admitting: Internal Medicine

## 2014-03-14 DIAGNOSIS — R9389 Abnormal findings on diagnostic imaging of other specified body structures: Secondary | ICD-10-CM

## 2014-03-25 ENCOUNTER — Ambulatory Visit (INDEPENDENT_AMBULATORY_CARE_PROVIDER_SITE_OTHER)
Admission: RE | Admit: 2014-03-25 | Discharge: 2014-03-25 | Disposition: A | Discharge status: Home / Self Care | Payer: Medicare Other | Source: Ambulatory Visit | Attending: Internal Medicine | Admitting: Internal Medicine

## 2014-03-25 DIAGNOSIS — I700 Atherosclerosis of aorta: Secondary | ICD-10-CM | POA: Diagnosis not present

## 2014-03-25 DIAGNOSIS — R938 Abnormal findings on diagnostic imaging of other specified body structures: Secondary | ICD-10-CM | POA: Diagnosis not present

## 2014-03-25 DIAGNOSIS — R911 Solitary pulmonary nodule: Secondary | ICD-10-CM | POA: Diagnosis not present

## 2014-03-25 DIAGNOSIS — R9389 Abnormal findings on diagnostic imaging of other specified body structures: Secondary | ICD-10-CM

## 2014-03-25 DIAGNOSIS — M47814 Spondylosis without myelopathy or radiculopathy, thoracic region: Secondary | ICD-10-CM | POA: Diagnosis not present

## 2014-03-25 DIAGNOSIS — J432 Centrilobular emphysema: Secondary | ICD-10-CM | POA: Diagnosis not present

## 2014-03-25 NOTE — Progress Notes (Signed)
Quick Note:  Spoke with pt and notified of results per Dr. Wert. Pt verbalized understanding and denied any questions.  ______ 

## 2014-04-12 ENCOUNTER — Ambulatory Visit (INDEPENDENT_AMBULATORY_CARE_PROVIDER_SITE_OTHER): Payer: Medicare Other | Admitting: Interventional Cardiology

## 2014-04-12 ENCOUNTER — Encounter: Payer: Self-pay | Admitting: Interventional Cardiology

## 2014-04-12 VITALS — BP 98/54 | HR 60 | Ht 70.0 in | Wt 206.0 lb

## 2014-04-12 DIAGNOSIS — I482 Chronic atrial fibrillation, unspecified: Secondary | ICD-10-CM

## 2014-04-12 DIAGNOSIS — I5042 Chronic combined systolic (congestive) and diastolic (congestive) heart failure: Secondary | ICD-10-CM

## 2014-04-12 DIAGNOSIS — I2510 Atherosclerotic heart disease of native coronary artery without angina pectoris: Secondary | ICD-10-CM

## 2014-04-12 DIAGNOSIS — I10 Essential (primary) hypertension: Secondary | ICD-10-CM

## 2014-04-12 LAB — BASIC METABOLIC PANEL
BUN: 14 mg/dL (ref 6–23)
CALCIUM: 9.7 mg/dL (ref 8.4–10.5)
CO2: 29 mEq/L (ref 19–32)
CREATININE: 1.14 mg/dL (ref 0.40–1.50)
Chloride: 105 mEq/L (ref 96–112)
GFR: 64.78 mL/min (ref >60.00)
Glucose, Bld: 92 mg/dL (ref 70–99)
POTASSIUM: 3.8 meq/L (ref 3.5–5.1)
Sodium: 141 mEq/L (ref 135–145)

## 2014-04-12 NOTE — Patient Instructions (Signed)
Your physician recommends that you have  lab work today--BMET.  Your physician wants you to follow-up in: 1 year with Dr Tamala Julian. (February 2017). You will receive a reminder letter in the mail two months in advance. If you don't receive a letter, please call our office to schedule the follow-up appointment.

## 2014-04-12 NOTE — Progress Notes (Signed)
Patient ID: Randall Odom, male   DOB: [birth year], 81 y.o.   MRN: [id number]    Cardiology Office Note   Date:  04/12/2014   ID:  Randall Odom, DOB 1926/08/08, MRN [id number]  PCP:  Tivis Ringer, MD  Cardiologist:   Sinclair Grooms, MD   No chief complaint on file.     History of Present Illness: Randall Odom is a 81 y.o. male who presents for follow-up of Chronic combined systolic and diastolic heart failure, chronic atrial fibrillation, and known coronary calcification. He is on chronic anticoagulation without complications. Dyspnea that he was having 6 months ago has improved. He denies cough. Weight is been stable. He has history of permanent pacemaker with high-grade AV block multiple years ago. A myocardial perfusion study performed in August 2015 demonstrated an inferior wall fixed defect and EF of 48%.   Past Medical History  Diagnosis Date  . Hypertension   . Muscle spasm   . Diabetes mellitus   . Enlarged prostate   . CAD (coronary artery disease)   . Complete heart block   . Diarrhea     From C. Diff infection   . Erectile dysfunction   . Low back pain   . Thoracic or lumbosacral neuritis or radiculitis, unspecified   . Depressive disorder, not elsewhere classified   . Carpal tunnel syndrome   . Allergic rhinitis   . Diverticulosis of colon (without mention of hemorrhage)   . Sacroiliitis, not elsewhere classified   . Abdominal pain, epigastric   . Atrial fibrillation with controlled ventricular response   . Recurrent falls   . Gait abnormality   . Dyspnea     ECHO 04/20/12 estimated EF = 40-45%.    . Chronic combined systolic and diastolic CHF (congestive heart failure)   . Intestinal infection due to Clostridium difficile   . Arthritis   . Glaucoma     History of glaucoma  . History of BPH   . DJD (degenerative joint disease)   . Anxiety   . Pure hypercholesterolemia   . Mixed hyperlipidemia     Followed by Dr. Dagmar Hait  . Left atrial enlargement      Moderate by ECHO 04/20/12  . Atrial enlargement, right     Moderate by ECHO 04/20/12  . Bronchitis     Past Surgical History  Procedure Laterality Date  . Pacemaker insertion  09/28/1997    DDD for high-grade AV Block. Had an upgrade to a dual chamber device on 09/09/97. Replaced dual chamber battery on 12/06/04. New device is a Financial risk analyst Identity ADX XL DR, model [id number], seriel [id number].  Marland Kitchen Appendectomy  1966  . Tonsillectomy  1953     Current Outpatient Prescriptions  Medication Sig Dispense Refill  . apixaban (ELIQUIS) 5 MG TABS tablet Take 1 tablet (5 mg total) by mouth 2 (two) times daily. 60 tablet 11  . Cholecalciferol (VITAMIN D) 2000 UNITS tablet Take 2,000 Units by mouth daily.    Marland Kitchen esomeprazole (NEXIUM) 40 MG capsule Take 40 mg by mouth daily at 12 noon.    . fluticasone (FLONASE) 50 MCG/ACT nasal spray Place 2 sprays into the nose daily as needed for allergies.    . furosemide (LASIX) 40 MG tablet Take 20 mg by mouth daily.  11  . glipiZIDE (GLUCOTROL) 5 MG tablet Take 5 mg by mouth daily before breakfast.    . lisinopril (PRINIVIL,ZESTRIL) 5 MG tablet Take 5 mg by mouth daily.    Marland Kitchen  Multiple Vitamin (MULTIVITAMIN WITH MINERALS) TABS Take 1 tablet by mouth daily.    . simvastatin (ZOCOR) 40 MG tablet Take 1 tablet (40 mg total) by mouth at bedtime. 30 tablet 1  . terazosin (HYTRIN) 5 MG capsule Take 1 capsule (5 mg total) by mouth at bedtime. 30 capsule 1   No current facility-administered medications for this visit.    Allergies:   Spironolactone    Social History:  The patient  reports that he quit smoking about 46 years ago. His smoking use included Cigarettes. He has a 37.5 pack-year smoking history. He has never used smokeless tobacco. He reports that he does not drink alcohol or use illicit drugs.   Family History:  The patient's family history includes Anemia in his brother; CVA in his father; Cancer in his mother; Heart disease in his brother.    ROS:  Please  see the history of present illness.   Otherwise, review of systems are positive for decreased memory..   All other systems are reviewed and negative.    PHYSICAL EXAM: VS:  BP 98/54 mmHg  Pulse 60  Ht 5\' 10"  (1.778 m)  Wt 206 lb (93.441 kg)  BMI 29.56 kg/m2  SpO2 98% , BMI Body mass index is 29.56 kg/(m^2). GEN: Well nourished, well developed, in no acute distress HEENT: normal Neck: no JVD, carotid bruits, or masses Cardiac: RRR; no murmurs, rubs, or gallops,no edema  Respiratory:  clear to auscultation bilaterally, normal work of breathing GI: soft, nontender, nondistended, + BS MS: no deformity or atrophy Skin: warm and dry, no rash Neuro:  Strength and sensation are intact Psych: euthymic mood, full affect   EKG:  EKG is not ordered today. The ekg ordered today demonstrates    Recent Labs: 08/09/2013: BUN 17; Creatinine 1.0; Hemoglobin 14.8; Hemoglobin 14.8; Platelets 154.0; Potassium 4.5; Pro B Natriuretic peptide (BNP) 71.0; Sodium 141    Lipid Panel No results found for: CHOL, TRIG, HDL, CHOLHDL, VLDL, LDLCALC, LDLDIRECT    Wt Readings from Last 3 Encounters:  04/12/14 206 lb (93.441 kg)  03/02/14 209 lb (94.802 kg)  10/13/13 207 lb (93.895 kg)      Other studies Reviewed: Additional studies/ records that were reviewed today include: Previous cardiac evaluation including last some as echo and myocardial perfusion study.. Review of the above records demonstrates: EF by nuclear wall motion study was 48%. Echo demonstrated EF of 50-55%.   ASSESSMENT AND PLAN:  1.  Chronic combined systolic and diastolic heart failure, asymptomatic without evidence of volume overload. 2. Chronic atrial fibrillation, with controlled rate 3. History of high-grade AV block with pacemaker functioning normally 4. Probable coronary artery disease with known three-vessel coronary calcification by CT and myocardial perfusion study demonstrating inferior wall infarct and EF of 48%, August  2015. Patient was asymptomatic and conservative medical management was recommended by me. 5. Chronic anticoagulation therapy without complications   Current medicines are reviewed at length with the patient today.  The patient does not have concerns regarding medicines.  The following changes have been made:  no change  Labs/ tests ordered today include:   Orders Placed This Encounter  Procedures  . Basic metabolic panel     Disposition:   FU with H. Smith1 in year    Signed, Sinclair Grooms, MD  04/12/2014 3:12 PM    [facility] [place], [place], [place]  [address] Phone: [phone]; Fax: [phone]

## 2014-04-13 ENCOUNTER — Ambulatory Visit: Payer: Medicare Other | Admitting: Interventional Cardiology

## 2014-04-13 ENCOUNTER — Telehealth: Payer: Self-pay

## 2014-04-13 NOTE — Telephone Encounter (Signed)
-----   Message from Sinclair Grooms, MD sent at 04/13/2014  7:54 AM EST ----- Blood work is stable.

## 2014-04-13 NOTE — Telephone Encounter (Signed)
pt aware of lab results.Blood work is stable.pt verbalized understanding.

## 2014-05-19 DIAGNOSIS — H35033 Hypertensive retinopathy, bilateral: Secondary | ICD-10-CM | POA: Diagnosis not present

## 2014-05-19 DIAGNOSIS — H4011X1 Primary open-angle glaucoma, mild stage: Secondary | ICD-10-CM | POA: Diagnosis not present

## 2014-05-19 DIAGNOSIS — E119 Type 2 diabetes mellitus without complications: Secondary | ICD-10-CM | POA: Diagnosis not present

## 2014-05-19 DIAGNOSIS — Z961 Presence of intraocular lens: Secondary | ICD-10-CM | POA: Diagnosis not present

## 2014-06-20 DIAGNOSIS — I10 Essential (primary) hypertension: Secondary | ICD-10-CM | POA: Diagnosis not present

## 2014-06-20 DIAGNOSIS — Z1389 Encounter for screening for other disorder: Secondary | ICD-10-CM | POA: Diagnosis not present

## 2014-06-20 DIAGNOSIS — E875 Hyperkalemia: Secondary | ICD-10-CM | POA: Diagnosis not present

## 2014-06-20 DIAGNOSIS — Z6832 Body mass index (BMI) 32.0-32.9, adult: Secondary | ICD-10-CM | POA: Diagnosis not present

## 2014-06-20 DIAGNOSIS — I480 Paroxysmal atrial fibrillation: Secondary | ICD-10-CM | POA: Diagnosis not present

## 2014-06-20 DIAGNOSIS — E119 Type 2 diabetes mellitus without complications: Secondary | ICD-10-CM | POA: Diagnosis not present

## 2014-07-11 ENCOUNTER — Telehealth: Payer: Self-pay | Admitting: *Deleted

## 2014-07-11 ENCOUNTER — Ambulatory Visit
Admission: RE | Admit: 2014-07-11 | Discharge: 2014-07-11 | Disposition: A | Discharge status: Home / Self Care | Payer: Medicare Other | Source: Ambulatory Visit | Attending: Nurse Practitioner | Admitting: Nurse Practitioner

## 2014-07-11 ENCOUNTER — Ambulatory Visit (INDEPENDENT_AMBULATORY_CARE_PROVIDER_SITE_OTHER): Payer: Medicare Other | Admitting: *Deleted

## 2014-07-11 ENCOUNTER — Telehealth: Payer: Self-pay | Admitting: Interventional Cardiology

## 2014-07-11 ENCOUNTER — Encounter: Payer: Self-pay | Admitting: Nurse Practitioner

## 2014-07-11 ENCOUNTER — Ambulatory Visit (INDEPENDENT_AMBULATORY_CARE_PROVIDER_SITE_OTHER): Payer: Medicare Other | Admitting: Nurse Practitioner

## 2014-07-11 VITALS — BP 110/50 | HR 76 | Ht 70.0 in | Wt 212.2 lb

## 2014-07-11 DIAGNOSIS — R0602 Shortness of breath: Secondary | ICD-10-CM | POA: Diagnosis not present

## 2014-07-11 DIAGNOSIS — I4821 Permanent atrial fibrillation: Secondary | ICD-10-CM

## 2014-07-11 DIAGNOSIS — Z950 Presence of cardiac pacemaker: Secondary | ICD-10-CM

## 2014-07-11 DIAGNOSIS — I482 Chronic atrial fibrillation: Secondary | ICD-10-CM

## 2014-07-11 DIAGNOSIS — R0600 Dyspnea, unspecified: Secondary | ICD-10-CM

## 2014-07-11 DIAGNOSIS — I2510 Atherosclerotic heart disease of native coronary artery without angina pectoris: Secondary | ICD-10-CM | POA: Diagnosis not present

## 2014-07-11 LAB — BRAIN NATRIURETIC PEPTIDE: Pro B Natriuretic peptide (BNP): 125 pg/mL — ABNORMAL HIGH (ref 0.0–100.0)

## 2014-07-11 LAB — CBC
HCT: 41.8 % (ref 39.0–52.0)
Hemoglobin: 13.8 g/dL (ref 13.0–17.0)
MCHC: 32.9 g/dL (ref 30.0–36.0)
MCV: 95.9 fl (ref 78.0–100.0)
Platelets: 303 10*3/uL (ref 150.0–400.0)
RBC: 4.36 Mil/uL (ref 4.22–5.81)
RDW: 12.9 % (ref 11.5–15.5)
WBC: 20.1 10*3/uL (ref 4.0–10.5)

## 2014-07-11 LAB — BASIC METABOLIC PANEL
BUN: 26 mg/dL — ABNORMAL HIGH (ref 6–23)
CO2: 27 mEq/L (ref 19–32)
Calcium: 9.5 mg/dL (ref 8.4–10.5)
Chloride: 102 mEq/L (ref 96–112)
Creatinine, Ser: 1.22 mg/dL (ref 0.40–1.50)
GFR: 59.87 mL/min — ABNORMAL LOW (ref >60.00)
Glucose, Bld: 124 mg/dL — ABNORMAL HIGH (ref 70–99)
Potassium: 4.3 mEq/L (ref 3.5–5.1)
Sodium: 139 mEq/L (ref 135–145)

## 2014-07-11 MED ORDER — FUROSEMIDE 40 MG PO TABS: 40.0000 mg | ORAL_TABLET | Freq: Every day | ORAL | Status: DC

## 2014-07-11 NOTE — Telephone Encounter (Signed)
Pt c/o Shortness Of Breath: STAT if SOB developed within the last 24 hours or pt is noticeably SOB on the phone  1. Are you currently SOB (can you hear that pt is SOB on the phone)? Yes but only when walking   2. How long have you been experiencing SOB? 1-2 weeks  3. Are you SOB when sitting or when up moving around? Moving around  4. Are you currently experiencing any other symptoms? No

## 2014-07-11 NOTE — Patient Instructions (Signed)
We will be checking the following labs today - BMET, BNP and CBC   Medication Instructions:    Continue with your current medicines.   I am increasing the Lasix to 40 mg a day for one week - then back to 20 mg     Testing/Procedures To Be Arranged:  Echocardiogram to be arranged Please go to [facility] to [address] on the first floor for a chest Xray - you may walk in. We will get your device checked today    Follow-Up:   Will be based on your test results    Other Special Instructions:   N/A  Call the [place] office at [phone] if you have any questions, problems or concerns.

## 2014-07-11 NOTE — Progress Notes (Signed)
Add-on seeing Truitt Merle due to DOE. Normal device function. Threshold, sensing, impedances consistent with previous measurements. Device programmed to maximize longevity. Device programmed at appropriate safety margins. Histogram distribution appropriate for patient activity level. Device programmed to optimize intrinsic conduction---turned off sensor. Estimated longevity 1.19yrs. ROV w/ AS 12/16.

## 2014-07-11 NOTE — Telephone Encounter (Signed)
I received critical WBC result of 20.1 from [place] at [facility] [phone]. I understood from [place]  that once I took the report and it was accepted it would be reported in Epic.  I reported critical WBC of 20.1 to [place], she was unable to locate the result in Epic.  Truitt Merle, NP stated she would look for the report in Epic tonight and contact the patient.  I spoke with Artis Delay in [facility] and told him I could not find the CBC report in Epic. Artis Delay stated that the CBC will not be reported in Epic until the BMET/BNP have been resulted which should be soon.   I will forward to Truitt Merle, NP for review.

## 2014-07-11 NOTE — Telephone Encounter (Signed)
I have been able to call these results - see my addended note.

## 2014-07-11 NOTE — Telephone Encounter (Signed)
Returned pt wife call. She reports that pt has been having increased SOB and fatigue Pt denies chest pain,swelling,fever, chills, nausea. Pt does not weigh daily. He has been trying to lose weight and has been dieting. Pt is unable to walt to the restroom without stopping to catch his breath. Pt wife rqst an earlier appt. Adv her I will ck the flex schedule and call back.  Pt wife aware pt can come to the office today @2 :30pm to see Tera Helper, NP. Pt wife appreciative and agreeable.

## 2014-07-11 NOTE — Progress Notes (Addendum)
CARDIOLOGY OFFICE NOTE  Date:  07/11/2014    Randall Odom Date of Birth: 1926/12/18 Medical Record #[id number]  PCP:  Tivis Ringer, MD  Cardiologist:  Tamala Julian    Chief Complaint  Patient presents with  . Congestive Heart Failure    Work in visit - seen for Dr. Tamala Julian     History of Present Illness: Randall Odom is a 81 y.o. male who presents today for a work in visit. Seen for Dr. Faythe Casa. He has a history of chronic combined systolic and diastolic heart failure, chronic atrial fibrillation, and known coronary calcification. He is on chronic anticoagulation without complications. He has history of permanent pacemaker with high-grade AV block multiple years ago. A myocardial perfusion study performed in August 2015 demonstrated an inferior wall fixed defect and EF of 48%.  He was last seen here in February - noted that his dyspnea seemed to be improved.  Phone call today - Returned pt wife call. She reports that pt has been having increased SOB and fatigue. Pt denies chest pain,swelling,fever, chills, nausea. Pt does not weigh daily. He has been trying to lose weight and has been dieting. Pt is unable to walt to the restroom without stopping to catch his breath. Pt wife rqst an earlier appt. Adv her I will ck the flex schedule and call back.  Pt wife aware pt can come to the office today @2 :30pm to see Tera Helper, NP. Pt wife appreciative and agreeable.   Comes back today. Here with his wife. Notes that he "just can't breath". More sedentary. Out of breath with very little activity. He admits that his memory is "not great". Looks to his wife for answers. Weight is up some. No real swelling. No real cough. Very sedentary.  Past Medical History  Diagnosis Date  . Hypertension   . Muscle spasm   . Diabetes mellitus   . Enlarged prostate   . CAD (coronary artery disease)   . Complete heart block   . Diarrhea     From C. Diff infection   . Erectile dysfunction   . Low  back pain   . Thoracic or lumbosacral neuritis or radiculitis, unspecified   . Depressive disorder, not elsewhere classified   . Carpal tunnel syndrome   . Allergic rhinitis   . Diverticulosis of colon (without mention of hemorrhage)   . Sacroiliitis, not elsewhere classified   . Abdominal pain, epigastric   . Atrial fibrillation with controlled ventricular response   . Recurrent falls   . Gait abnormality   . Dyspnea     ECHO 04/20/12 estimated EF = 40-45%.    . Chronic combined systolic and diastolic CHF (congestive heart failure)   . Intestinal infection due to Clostridium difficile   . Arthritis   . Glaucoma     History of glaucoma  . History of BPH   . DJD (degenerative joint disease)   . Anxiety   . Pure hypercholesterolemia   . Mixed hyperlipidemia     Followed by Dr. Dagmar Hait  . Left atrial enlargement     Moderate by ECHO 04/20/12  . Atrial enlargement, right     Moderate by ECHO 04/20/12  . Bronchitis     Past Surgical History  Procedure Laterality Date  . Pacemaker insertion  09/28/1997    DDD for high-grade AV Block. Had an upgrade to a dual chamber device on 09/09/97. Replaced dual chamber battery on 12/06/04. New device is a Psychologist, sport and exercise  ADX XL DR, model [id number], seriel [id number].  Marland Kitchen Appendectomy  1966  . Tonsillectomy  1953     Medications: Current Outpatient Prescriptions  Medication Sig Dispense Refill  . apixaban (ELIQUIS) 5 MG TABS tablet Take 1 tablet (5 mg total) by mouth 2 (two) times daily. 60 tablet 11  . Cholecalciferol (VITAMIN D) 2000 UNITS tablet Take 2,000 Units by mouth daily.    Marland Kitchen esomeprazole (NEXIUM) 40 MG capsule Take 40 mg by mouth daily at 12 noon.    . fluticasone (FLONASE) 50 MCG/ACT nasal spray Place 2 sprays into the nose daily as needed for allergies.    . furosemide (LASIX) 40 MG tablet Take 1 tablet (40 mg total) by mouth daily. 40 mg daily for one week - then back to 20 mg a day 30 tablet 11  . glipiZIDE (GLUCOTROL) 5 MG tablet Take 5  mg by mouth daily before breakfast.    . lisinopril (PRINIVIL,ZESTRIL) 5 MG tablet Take 5 mg by mouth daily.    . Multiple Vitamin (MULTIVITAMIN WITH MINERALS) TABS Take 1 tablet by mouth daily.    . simvastatin (ZOCOR) 40 MG tablet Take 1 tablet (40 mg total) by mouth at bedtime. 30 tablet 1  . terazosin (HYTRIN) 5 MG capsule Take 1 capsule (5 mg total) by mouth at bedtime. 30 capsule 1   No current facility-administered medications for this visit.    Allergies: Allergies  Allergen Reactions  . Spironolactone Diarrhea and Nausea And Vomiting    Stopped after 2 doses due to vomiting and diarrhea    Social History: The patient  reports that he quit smoking about 46 years ago. His smoking use included Cigarettes. He has a 37.5 pack-year smoking history. He has never used smokeless tobacco. He reports that he does not drink alcohol or use illicit drugs.   Family History: The patient's family history includes Anemia in his brother; CVA in his father; Cancer in his mother; Heart disease in his brother.   Review of Systems: Please see the history of present illness.   Otherwise, the review of systems is positive for .   All other systems are reviewed and negative.   Physical Exam: VS:  BP 110/50 mmHg  Pulse 76  Ht 5\' 10"  (1.778 m)  Wt 212 lb 3.2 oz (96.253 kg)  BMI 30.45 kg/m2  SpO2 98% .  BMI Body mass index is 30.45 kg/(m^2).  Wt Readings from Last 3 Encounters:  07/11/14 212 lb 3.2 oz (96.253 kg)  04/12/14 206 lb (93.441 kg)  03/02/14 209 lb (94.802 kg)    General: Pleasant. Well developed, well nourished and in no acute distress.  HEENT: Normal. Neck: Supple, no JVD, carotid bruits, or masses noted.  Cardiac: Regular rate and rhythm - presumed paced.  Trace edema.  Respiratory:  Lungs are clear to auscultation bilaterally with normal work of breathing.  GI: Soft and nontender.  MS: No deformity or atrophy. Gait and ROM intact. Skin: Warm and dry. Color is normal.  Neuro:   Strength and sensation are intact and no gross focal deficits noted.  Psych: Alert, appropriate and with normal affect.   LABORATORY DATA:  EKG:  EKG is not ordered today. This demonstrates .  Lab Results  Component Value Date   WBC 11.8* 08/09/2013   HGB 14.8 08/09/2013   HGB 14.8 08/09/2013   HCT 44.7 08/09/2013   PLT 154.0 08/09/2013   GLUCOSE 92 04/12/2014   ALT 35 05/18/2012   AST 31 05/18/2012  NA 141 04/12/2014   K 3.8 04/12/2014   CL 105 04/12/2014   CREATININE 1.14 04/12/2014   BUN 14 04/12/2014   CO2 29 04/12/2014   HGBA1C 6.0* 05/18/2012    BNP (last 3 results) No results for input(s): BNP in the last 8760 hours.  ProBNP (last 3 results)  Recent Labs  08/09/13 1552  PROBNP 71.0     Other Studies Reviewed Today:  Myoview Impression 10/2013 Exercise Capacity: Adenosine study with no exercise. BP Response: Normal blood pressure response. Clinical Symptoms: No symptoms. ECG Impression: v-paced Comparison with Prior Nuclear Study: No images to compare  Overall Impression: Low risk stress nuclear study with a medium-sized, moderate inferior and inferoseptal perfusion defect. EF is 48% but hypokinesis appears global without regionality. Possible prior inferior/inferoseptal infarction, no evidence for ischemia. .  LV Ejection Fraction: 48%. LV Wall Motion: Mild global hypokinesis.   Loralie Champagne 10/13/2013   CT CHEST WITHOUT CONTRAST  TECHNIQUE: Multidetector CT imaging of the chest was performed following the standard protocol without IV contrast.  COMPARISON: Multiple exams, including 09/30/2013 and 09/11/2013  FINDINGS: Mediastinum/Nodes: Aortic, branch vessel, and coronary atherosclerosis. Right-sided pacer noted. No pathologic thoracic adenopathy observed.  Lungs/Pleura: Centrilobular emphysema. The band of volume loss surrounding several adjacent bulla in the superior segment right lower lobe, no change from  09/21/2013 but mildly thicker than on 02/18/2010. Possible associated mild bronchiectasis. The degree of volume loss is likewise unchanged.  There is some mucus plugging in the distal trachea extending into the right mainstem bronchus in a linear fashion.  Upper abdomen: Unremarkable  Musculoskeletal: Thoracic spondylosis. Partially bifid left tenth rib.  IMPRESSION: 1. Stable region of volume loss surrounding several bulla in the superior segment right upper lobe. Given the low metabolic activity on the prior PET-CT, this is not considered specific for malignancy in could simply represent scarring and chronic atelectasis in the setting of emphysema. Although it is stable over the last 6 months it has thickened somewhat over the last 4 years. Options include over section or continued monitoring. I would suggest a 1 year follow up. If continued monitoring is elected.   Electronically Signed  By: Sherryl Barters M.D.  On: 03/25/2014 13:00  Assessment/Plan: 1. Dyspnea - most likely multifactorial - has emphysema noted on prior CT chest/prior abnormal CT and CXR. Check BNP and lab today. Increase Lasix for one week. May need to get back to pulmonary.   2. Chronic systolic HF - not able to retrieve prior echo due to [place] live - will get echo updated. I am increasing his lasix for one week.   3. Underlying PPM - will check today. Looks to be V pacing. Hard to say what his underlying rhythm is - looks to be AF. His rate response is set to "very fast" - meaning very aggressive - will try to turn down and recheck here in the office today. With this change - he looked to have more RVR - discussed with Dr. Caryl Comes - will turn off the rate response - and rewalk him - programmed VVI rate of 60.   4. Memory issues  5. Prior abnormal CT chest/CXR - may need to get back to pulmonary.   6. PAF - on Eliquis  Current medicines are reviewed with the patient today.  The patient does  not have concerns regarding medicines other than what has been noted above.  The following changes have been made:  See above.  Labs/ tests ordered today include:  Orders Placed This Encounter  Procedures  . DG Chest 2 View  . Basic metabolic panel  . Brain natriuretic peptide  . CBC  . EKG 12-Lead  . Echocardiogram     Disposition:   Further disposition to follow.   Patient is agreeable to this plan and will call if any problems develop in the interim.   Signed: Burtis Junes, RN, ANP-C 07/11/2014 2:57 PM  [facility] [address] [place], [place]  [address] Phone: [phone] Fax: [phone]        Addendum Labs have been noted - basically ok except for WBC of 20K. I do not have a good explanation for this. CXR was ok. He is afebrile. I have been able to speak to his wife. She will try to see PCP on Tuesday for further evaluation.   She does note that he already seems a little better in regards to his breathing from the pacemaker change (programmed VVI).  Burtis Junes, RN, [facility] [address] [place], [place]  [address] [phone]

## 2014-07-12 ENCOUNTER — Telehealth: Payer: Self-pay | Admitting: *Deleted

## 2014-07-12 DIAGNOSIS — I480 Paroxysmal atrial fibrillation: Secondary | ICD-10-CM | POA: Diagnosis not present

## 2014-07-12 DIAGNOSIS — E119 Type 2 diabetes mellitus without complications: Secondary | ICD-10-CM | POA: Diagnosis not present

## 2014-07-12 DIAGNOSIS — R8299 Other abnormal findings in urine: Secondary | ICD-10-CM | POA: Diagnosis not present

## 2014-07-12 DIAGNOSIS — R531 Weakness: Secondary | ICD-10-CM | POA: Diagnosis not present

## 2014-07-12 DIAGNOSIS — R0600 Dyspnea, unspecified: Secondary | ICD-10-CM | POA: Diagnosis not present

## 2014-07-12 DIAGNOSIS — D72829 Elevated white blood cell count, unspecified: Secondary | ICD-10-CM | POA: Diagnosis not present

## 2014-07-12 DIAGNOSIS — Z6831 Body mass index (BMI) 31.0-31.9, adult: Secondary | ICD-10-CM | POA: Diagnosis not present

## 2014-07-12 DIAGNOSIS — N390 Urinary tract infection, site not specified: Secondary | ICD-10-CM | POA: Diagnosis not present

## 2014-07-12 LAB — CUP PACEART INCLINIC DEVICE CHECK
Battery Voltage: 2.71 V
Date Time Interrogation Session: 20160502190305
Lead Channel Impedance Value: 457 Ohm
Lead Channel Pacing Threshold Amplitude: 1.38 V
Lead Channel Pacing Threshold Pulse Width: 0.5 ms
Lead Channel Sensing Intrinsic Amplitude: 7 mV
Lead Channel Setting Pacing Pulse Width: 0.5 ms
Lead Channel Setting Sensing Sensitivity: 2 mV
MDC IDC MSMT BATTERY IMPEDANCE: 8600 Ohm
MDC IDC STAT BRADY RV PERCENT PACED: 94 %
Pulse Gen Model: 5386
Pulse Gen Serial Number: 1110590

## 2014-07-12 NOTE — Telephone Encounter (Signed)
Lm to see if pt got in to see Dr. Dagmar Hait today.

## 2014-07-13 DIAGNOSIS — E875 Hyperkalemia: Secondary | ICD-10-CM | POA: Diagnosis not present

## 2014-07-14 ENCOUNTER — Encounter: Payer: Self-pay | Admitting: Interventional Cardiology

## 2014-07-15 ENCOUNTER — Ambulatory Visit (HOSPITAL_COMMUNITY): Payer: Medicare Other | Attending: Cardiology

## 2014-07-15 ENCOUNTER — Other Ambulatory Visit: Payer: Self-pay

## 2014-07-15 DIAGNOSIS — I10 Essential (primary) hypertension: Secondary | ICD-10-CM | POA: Insufficient documentation

## 2014-07-15 DIAGNOSIS — R0600 Dyspnea, unspecified: Secondary | ICD-10-CM | POA: Diagnosis not present

## 2014-07-15 DIAGNOSIS — E119 Type 2 diabetes mellitus without complications: Secondary | ICD-10-CM | POA: Insufficient documentation

## 2014-07-15 DIAGNOSIS — E785 Hyperlipidemia, unspecified: Secondary | ICD-10-CM | POA: Insufficient documentation

## 2014-07-22 ENCOUNTER — Encounter: Payer: Self-pay | Admitting: Internal Medicine

## 2014-08-09 ENCOUNTER — Ambulatory Visit: Payer: Medicare Other | Admitting: Physician Assistant

## 2014-08-15 ENCOUNTER — Other Ambulatory Visit: Payer: Self-pay | Admitting: Interventional Cardiology

## 2014-08-15 ENCOUNTER — Other Ambulatory Visit: Payer: Self-pay

## 2014-08-15 MED ORDER — FUROSEMIDE 40 MG PO TABS: 40.0000 mg | ORAL_TABLET | Freq: Every day | ORAL | Status: DC

## 2014-08-16 NOTE — Telephone Encounter (Signed)
Per note 5.2.16

## 2014-10-17 DIAGNOSIS — L570 Actinic keratosis: Secondary | ICD-10-CM | POA: Diagnosis not present

## 2014-10-17 DIAGNOSIS — D485 Neoplasm of uncertain behavior of skin: Secondary | ICD-10-CM | POA: Diagnosis not present

## 2014-10-21 DIAGNOSIS — F419 Anxiety disorder, unspecified: Secondary | ICD-10-CM | POA: Diagnosis not present

## 2014-10-21 DIAGNOSIS — I2510 Atherosclerotic heart disease of native coronary artery without angina pectoris: Secondary | ICD-10-CM | POA: Diagnosis not present

## 2014-10-21 DIAGNOSIS — E1140 Type 2 diabetes mellitus with diabetic neuropathy, unspecified: Secondary | ICD-10-CM | POA: Diagnosis not present

## 2014-10-21 DIAGNOSIS — Z6831 Body mass index (BMI) 31.0-31.9, adult: Secondary | ICD-10-CM | POA: Diagnosis not present

## 2014-10-21 DIAGNOSIS — M1990 Unspecified osteoarthritis, unspecified site: Secondary | ICD-10-CM | POA: Diagnosis not present

## 2014-10-21 DIAGNOSIS — I480 Paroxysmal atrial fibrillation: Secondary | ICD-10-CM | POA: Diagnosis not present

## 2015-02-07 ENCOUNTER — Other Ambulatory Visit: Payer: Self-pay | Admitting: Internal Medicine

## 2015-02-07 DIAGNOSIS — R9389 Abnormal findings on diagnostic imaging of other specified body structures: Secondary | ICD-10-CM

## 2015-02-24 DIAGNOSIS — N390 Urinary tract infection, site not specified: Secondary | ICD-10-CM | POA: Diagnosis not present

## 2015-02-24 DIAGNOSIS — K219 Gastro-esophageal reflux disease without esophagitis: Secondary | ICD-10-CM | POA: Diagnosis not present

## 2015-02-24 DIAGNOSIS — R0600 Dyspnea, unspecified: Secondary | ICD-10-CM | POA: Diagnosis not present

## 2015-02-24 DIAGNOSIS — R8299 Other abnormal findings in urine: Secondary | ICD-10-CM | POA: Diagnosis not present

## 2015-02-24 DIAGNOSIS — E1140 Type 2 diabetes mellitus with diabetic neuropathy, unspecified: Secondary | ICD-10-CM | POA: Diagnosis not present

## 2015-02-24 DIAGNOSIS — D72829 Elevated white blood cell count, unspecified: Secondary | ICD-10-CM | POA: Diagnosis not present

## 2015-02-24 DIAGNOSIS — M1990 Unspecified osteoarthritis, unspecified site: Secondary | ICD-10-CM | POA: Diagnosis not present

## 2015-02-24 DIAGNOSIS — I2510 Atherosclerotic heart disease of native coronary artery without angina pectoris: Secondary | ICD-10-CM | POA: Diagnosis not present

## 2015-02-24 DIAGNOSIS — I480 Paroxysmal atrial fibrillation: Secondary | ICD-10-CM | POA: Diagnosis not present

## 2015-02-24 DIAGNOSIS — E784 Other hyperlipidemia: Secondary | ICD-10-CM | POA: Diagnosis not present

## 2015-02-24 DIAGNOSIS — Z6831 Body mass index (BMI) 31.0-31.9, adult: Secondary | ICD-10-CM | POA: Diagnosis not present

## 2015-02-24 DIAGNOSIS — I10 Essential (primary) hypertension: Secondary | ICD-10-CM | POA: Diagnosis not present

## 2015-02-28 ENCOUNTER — Encounter: Payer: Self-pay | Admitting: Nurse Practitioner

## 2015-02-28 NOTE — Progress Notes (Signed)
Electrophysiology Office Note Date: 03/01/2015  ID:  LIO GUESS, DOB 18-Jun-1926, MRN [id number]  PCP: Tivis Ringer, MD Primary Cardiologist: Tamala Julian Electrophysiologist: [place]  CC: Pacemaker follow-up  Randall Odom is a 81 y.o. male seen today for Dr Rayann Heman.  He presents today for routine electrophysiology followup.  Since last being seen in our clinic, the patient reports doing relatively well.  He remains sedentary but walks around the house and up the driveway with stable shortness of breath. He denies chest pain, palpitations, PND, orthopnea, nausea, vomiting, dizziness, syncope, edema, weight gain, or early satiety.  Device History: STJ single chamber PPM implanted 1989 for CHB; upgrade to STJ dual chamber PPM 1999; gen change 2006   Past Medical History  Diagnosis Date  . Hypertension   . Diabetes mellitus   . Enlarged prostate   . CAD (coronary artery disease)   . Complete heart block ([place])   . Erectile dysfunction   . Thoracic or lumbosacral neuritis or radiculitis, unspecified   . Depressive disorder, not elsewhere classified   . Carpal tunnel syndrome   . Allergic rhinitis   . Diverticulosis of colon (without mention of hemorrhage)   . Sacroiliitis, not elsewhere classified ([place])   . Atrial fibrillation with controlled ventricular response ([place])   . Recurrent falls   . Chronic combined systolic and diastolic CHF (congestive heart failure) ([place])   . Glaucoma     History of glaucoma  . History of BPH   . DJD (degenerative joint disease)   . Anxiety   . Pure hypercholesterolemia    Past Surgical History  Procedure Laterality Date  . Pacemaker insertion  09/28/1997    DDD for high-grade AV Block. Had an upgrade to a dual chamber device on 09/09/97. Replaced dual chamber battery on 12/06/04. New device is a Financial risk analyst Identity ADX XL DR, model [id number], seriel [id number].  Marland Kitchen Appendectomy  1966  . Tonsillectomy  1953    Current Outpatient Prescriptions    Medication Sig Dispense Refill  . Cholecalciferol (VITAMIN D) 2000 UNITS tablet Take 2,000 Units by mouth daily.    Marland Kitchen ELIQUIS 5 MG TABS tablet TAKE 1 TABLET (5 MG TOTAL) BY MOUTH 2 (TWO) TIMES DAILY. 60 tablet 6  . esomeprazole (NEXIUM) 40 MG capsule Take 40 mg by mouth daily at 12 noon.    . fluticasone (FLONASE) 50 MCG/ACT nasal spray Place 2 sprays into the nose daily as needed for allergies.    . furosemide (LASIX) 40 MG tablet Take 0.5 tablets (20 mg total) by mouth daily. 30 tablet 6  . glipiZIDE (GLUCOTROL) 5 MG tablet Take 5 mg by mouth daily before breakfast.    . lisinopril (PRINIVIL,ZESTRIL) 5 MG tablet Take 5 mg by mouth daily.    . Multiple Vitamin (MULTIVITAMIN WITH MINERALS) TABS Take 1 tablet by mouth daily.    . simvastatin (ZOCOR) 40 MG tablet Take 1 tablet (40 mg total) by mouth at bedtime. 30 tablet 1  . terazosin (HYTRIN) 5 MG capsule Take 1 capsule (5 mg total) by mouth at bedtime. 30 capsule 1   No current facility-administered medications for this visit.    Allergies:   Spironolactone   Social History: Social History   Social History  . Marital Status: Married    Spouse Name: N/A  . Number of Children: N/A  . Years of Education: N/A   Occupational History  . Retired    Social History Main Topics  . Smoking status: Former Smoker --  1.50 packs/day for 25 years    Types: Cigarettes    Quit date: 03/11/1968  . Smokeless tobacco: Never Used  . Alcohol Use: No  . Drug Use: No  . Sexual Activity: Not on file   Other Topics Concern  . Not on file   Social History Narrative    Family History: Family History  Problem Relation Age of Onset  . Cancer Mother     breast  . CVA Father   . Anemia Brother   . Heart disease Brother      Review of Systems: All other systems reviewed and are otherwise negative except as noted above.   Physical Exam: VS:  BP 128/78 mmHg  Pulse 72  Ht 5\' 10"  (1.778 m)  Wt 212 lb 9.6 oz (96.435 kg)  BMI 30.51 kg/m2 ,  BMI Body mass index is 30.51 kg/(m^2).  GEN- The patient is elderly and obese appearing, alert and oriented x 3 today.   HEENT: normocephalic, atraumatic; sclera clear, conjunctiva pink; hearing intact; oropharynx clear; neck supple Lungs- Clear to ausculation bilaterally, normal work of breathing.  No wheezes, rales, rhonchi Heart- Irregular rate and rhythm GI- soft, non-tender, non-distended, bowel sounds present Extremities- no clubbing, cyanosis, or edema; DP/PT/radial pulses 1+ bilaterally MS- no significant deformity or atrophy Skin- warm and dry, no rash or lesion; PPM pocket well healed Psych- euthymic mood, full affect Neuro- strength and sensation are intact  PPM Interrogation- reviewed in detail today,  See PACEART report  EKG:  EKG is ordered today. EKG demonstrated atrial fibrillation with intermittent ventricular pacing  Recent Labs: 07/11/2014: BUN 26*; Creatinine, Ser 1.22; Hemoglobin 13.8; Platelets 303.0; Potassium 4.3; Pro B Natriuretic peptide (BNP) 125.0*; Sodium 139   Wt Readings from Last 3 Encounters:  03/01/15 212 lb 9.6 oz (96.435 kg)  07/11/14 212 lb 3.2 oz (96.253 kg)  04/12/14 206 lb (93.441 kg)     Other studies Reviewed: Additional studies/ records that were reviewed today include: Dr Rayann Heman and Dr Thompson Caul office notes  Assessment and Plan:  1.  Complete heart block Normal PPM function Rate response turned off at last visit with Truitt Merle. Pt paces 81% of the time but is very sedentary. There was concern about rate response being too sensitive causing inappropriately high V rates. The patient has noticed no difference in functional capacity (V rates with rate response on were flat).  See Pace Art report No changes today Estimated longevity 9 months.  The patient does not do TTM's, will see again in 6 months and then likely monthly checks  2.  Permanent atrial fibrillation Continue Eliquis for CHADS2VASC of at least 6 V rates controlled  3.   Chronic systolic dysfunction Euvolemic on exam Continue current therapy   Current medicines are reviewed at length with the patient today.   The patient does not have concerns regarding his medicines.  The following changes were made today:  none  Labs/ tests ordered today include: none  Disposition:   Follow up with Dr Tamala Julian as scheduled, me in 6 months   Signed, Chanetta Marshall, NP 03/01/2015 9:40 AM  [facility] [address] [place] [address] [phone] (office) [phone] (fax)

## 2015-03-01 ENCOUNTER — Encounter: Payer: Self-pay | Admitting: Nurse Practitioner

## 2015-03-01 ENCOUNTER — Encounter: Payer: Self-pay | Admitting: Internal Medicine

## 2015-03-01 ENCOUNTER — Ambulatory Visit (INDEPENDENT_AMBULATORY_CARE_PROVIDER_SITE_OTHER): Payer: Medicare Other | Admitting: Nurse Practitioner

## 2015-03-01 VITALS — BP 128/78 | HR 72 | Ht 70.0 in | Wt 212.6 lb

## 2015-03-01 DIAGNOSIS — I4821 Permanent atrial fibrillation: Secondary | ICD-10-CM

## 2015-03-01 DIAGNOSIS — I482 Chronic atrial fibrillation: Secondary | ICD-10-CM

## 2015-03-01 DIAGNOSIS — I2510 Atherosclerotic heart disease of native coronary artery without angina pectoris: Secondary | ICD-10-CM | POA: Diagnosis not present

## 2015-03-01 DIAGNOSIS — I442 Atrioventricular block, complete: Secondary | ICD-10-CM | POA: Diagnosis not present

## 2015-03-01 LAB — CUP PACEART INCLINIC DEVICE CHECK
Implantable Lead Location: 753859
Lead Channel Setting Pacing Pulse Width: 0.5 ms
Lead Channel Setting Sensing Sensitivity: 2 mV
MDC IDC LEAD IMPLANT DT: 19990721
MDC IDC LEAD IMPLANT DT: 19990721
MDC IDC LEAD LOCATION: 753860
MDC IDC SESS DTM: 20161221094549
Pulse Gen Model: 5386
Pulse Gen Serial Number: 1110590

## 2015-03-01 NOTE — Patient Instructions (Signed)
Medication Instructions:   Your physician recommends that you continue on your current medications as directed. Please refer to the Current Medication list given to you today.   If you need a refill on your cardiac medications before your next appointment, please call your pharmacy.  Labwork: NONE ORDER TODAY   Testing/Procedures: NONE ORDER TODAY   Follow-Up:  Your physician wants you to follow-up in:  [place] will receive a reminder letter in the mail two months in advance. If you don't receive a letter, please call our office to schedule the follow-up appointment.      Any Other Special Instructions Will Be Listed Below (If Applicable).

## 2015-03-08 ENCOUNTER — Encounter: Payer: Medicare Other | Admitting: Nurse Practitioner

## 2015-03-11 ENCOUNTER — Other Ambulatory Visit: Payer: Self-pay | Admitting: Interventional Cardiology

## 2015-03-27 ENCOUNTER — Ambulatory Visit (INDEPENDENT_AMBULATORY_CARE_PROVIDER_SITE_OTHER)
Admission: RE | Admit: 2015-03-27 | Discharge: 2015-03-27 | Disposition: A | Discharge status: Home / Self Care | Payer: Medicare Other | Source: Ambulatory Visit | Attending: Internal Medicine | Admitting: Internal Medicine

## 2015-03-27 DIAGNOSIS — R938 Abnormal findings on diagnostic imaging of other specified body structures: Secondary | ICD-10-CM

## 2015-03-27 DIAGNOSIS — R9389 Abnormal findings on diagnostic imaging of other specified body structures: Secondary | ICD-10-CM

## 2015-03-27 NOTE — Progress Notes (Signed)
Quick Note:  Spoke with pt and notified of results per Dr. Wert. Pt verbalized understanding and denied any questions.  ______ 

## 2015-05-23 ENCOUNTER — Encounter (HOSPITAL_COMMUNITY): Payer: Self-pay

## 2015-05-23 ENCOUNTER — Emergency Department (HOSPITAL_COMMUNITY)
Admission: EM | Admit: 2015-05-23 | Discharge: 2015-05-23 | Disposition: A | Discharge status: Home / Self Care | Payer: Medicare Other | Attending: Emergency Medicine | Admitting: Emergency Medicine

## 2015-05-23 DIAGNOSIS — E119 Type 2 diabetes mellitus without complications: Secondary | ICD-10-CM | POA: Insufficient documentation

## 2015-05-23 DIAGNOSIS — K5900 Constipation, unspecified: Secondary | ICD-10-CM | POA: Diagnosis not present

## 2015-05-23 DIAGNOSIS — Z8659 Personal history of other mental and behavioral disorders: Secondary | ICD-10-CM | POA: Insufficient documentation

## 2015-05-23 DIAGNOSIS — Z87891 Personal history of nicotine dependence: Secondary | ICD-10-CM | POA: Insufficient documentation

## 2015-05-23 DIAGNOSIS — Z950 Presence of cardiac pacemaker: Secondary | ICD-10-CM | POA: Diagnosis not present

## 2015-05-23 DIAGNOSIS — Z8739 Personal history of other diseases of the musculoskeletal system and connective tissue: Secondary | ICD-10-CM | POA: Insufficient documentation

## 2015-05-23 DIAGNOSIS — R10819 Abdominal tenderness, unspecified site: Secondary | ICD-10-CM | POA: Diagnosis not present

## 2015-05-23 DIAGNOSIS — Z7984 Long term (current) use of oral hypoglycemic drugs: Secondary | ICD-10-CM | POA: Diagnosis not present

## 2015-05-23 DIAGNOSIS — I5042 Chronic combined systolic (congestive) and diastolic (congestive) heart failure: Secondary | ICD-10-CM | POA: Diagnosis not present

## 2015-05-23 DIAGNOSIS — I10 Essential (primary) hypertension: Secondary | ICD-10-CM | POA: Insufficient documentation

## 2015-05-23 DIAGNOSIS — I2510 Atherosclerotic heart disease of native coronary artery without angina pectoris: Secondary | ICD-10-CM | POA: Insufficient documentation

## 2015-05-23 DIAGNOSIS — N400 Enlarged prostate without lower urinary tract symptoms: Secondary | ICD-10-CM | POA: Diagnosis not present

## 2015-05-23 DIAGNOSIS — Z8669 Personal history of other diseases of the nervous system and sense organs: Secondary | ICD-10-CM | POA: Insufficient documentation

## 2015-05-23 DIAGNOSIS — E7800 Pure hypercholesterolemia, unspecified: Secondary | ICD-10-CM | POA: Diagnosis not present

## 2015-05-23 DIAGNOSIS — I4891 Unspecified atrial fibrillation: Secondary | ICD-10-CM | POA: Diagnosis not present

## 2015-05-23 DIAGNOSIS — Z79899 Other long term (current) drug therapy: Secondary | ICD-10-CM | POA: Insufficient documentation

## 2015-05-23 DIAGNOSIS — R52 Pain, unspecified: Secondary | ICD-10-CM | POA: Diagnosis not present

## 2015-05-23 LAB — COMPREHENSIVE METABOLIC PANEL
ALK PHOS: 71 U/L (ref 38–126)
ALT: 28 U/L (ref 17–63)
ANION GAP: 10 (ref 5–15)
AST: 26 U/L (ref 15–41)
Albumin: 3.6 g/dL (ref 3.5–5.0)
BUN: 21 mg/dL — ABNORMAL HIGH (ref 6–20)
CO2: 25 mmol/L (ref 22–32)
Calcium: 9.3 mg/dL (ref 8.9–10.3)
Chloride: 106 mmol/L (ref 101–111)
Creatinine, Ser: 1.16 mg/dL (ref 0.61–1.24)
GFR calc non Af Amer: 55 mL/min — ABNORMAL LOW (ref >60)
Glucose, Bld: 160 mg/dL — ABNORMAL HIGH (ref 65–99)
Potassium: 3.8 mmol/L (ref 3.5–5.1)
SODIUM: 141 mmol/L (ref 135–145)
TOTAL PROTEIN: 6.6 g/dL (ref 6.5–8.1)
Total Bilirubin: 1 mg/dL (ref 0.3–1.2)

## 2015-05-23 LAB — CBC
HCT: 40.3 % (ref 39.0–52.0)
HEMOGLOBIN: 13.8 g/dL (ref 13.0–17.0)
MCH: 31.7 pg (ref 26.0–34.0)
MCHC: 34.2 g/dL (ref 30.0–36.0)
MCV: 92.4 fL (ref 78.0–100.0)
PLATELETS: 139 10*3/uL — AB (ref 150–400)
RBC: 4.36 MIL/uL (ref 4.22–5.81)
RDW: 13.7 % (ref 11.5–15.5)
WBC: 20.1 10*3/uL — ABNORMAL HIGH (ref 4.0–10.5)

## 2015-05-23 LAB — LIPASE, BLOOD: LIPASE: 29 U/L (ref 11–51)

## 2015-05-23 MED ORDER — POLYETHYLENE GLYCOL 3350 17 G PO PACK: 17.0000 g | PACK | Freq: Every day | ORAL | Status: DC | PRN

## 2015-05-23 MED ORDER — DOCUSATE SODIUM 100 MG PO CAPS: 100.0000 mg | ORAL_CAPSULE | Freq: Two times a day (BID) | ORAL | Status: DC

## 2015-05-23 NOTE — ED Notes (Signed)
Patient and family have been notified about needed a urine sample. Urinal at the bedside.

## 2015-05-23 NOTE — ED Notes (Signed)
Pt was able to have a small BM earlier today after the sodium nitrate his wife had given him. Before then, hasn't had a BM in two days. Abdomin soft, non-tender, appears to be slightly distended, pt states since the BM this morning he's starting to feel better.

## 2015-05-23 NOTE — ED Notes (Signed)
Per EMS- Patient's wife gave the patient sodium Nitrate for constipation with norelief. Patient c/o abdominal cramping since taking the med.

## 2015-05-23 NOTE — ED Provider Notes (Signed)
CSN: [id number]     Arrival date & time 05/23/15  1107 History   First MD Initiated Contact with Patient 05/23/15 1246     Chief Complaint  Patient presents with  . Constipation     (Consider location/radiation/quality/duration/timing/severity/associated sxs/prior Treatment) Patient is a 82 y.o. male presenting with constipation.  Constipation Severity:  Moderate Timing:  Constant Progression:  Resolved Chronicity:  New Context: not dehydration and not narcotics   Stool description:  None produced Relieved by:  None tried Worsened by:  Nothing tried Ineffective treatments:  None tried Associated symptoms: no abdominal pain, no fever and no flatus     Past Medical History  Diagnosis Date  . Hypertension   . Diabetes mellitus   . Enlarged prostate   . CAD (coronary artery disease)   . Complete heart block ([place])   . Erectile dysfunction   . Thoracic or lumbosacral neuritis or radiculitis, unspecified   . Depressive disorder, not elsewhere classified   . Carpal tunnel syndrome   . Allergic rhinitis   . Diverticulosis of colon (without mention of hemorrhage)   . Sacroiliitis, not elsewhere classified ([place])   . Atrial fibrillation with controlled ventricular response ([place])   . Recurrent falls   . Chronic combined systolic and diastolic CHF (congestive heart failure) ([place])   . Glaucoma     History of glaucoma  . History of BPH   . DJD (degenerative joint disease)   . Anxiety   . Pure hypercholesterolemia    Past Surgical History  Procedure Laterality Date  . Pacemaker insertion  09/28/1997    DDD for high-grade AV Block. Had an upgrade to a dual chamber device on 09/09/97. Replaced dual chamber battery on 12/06/04. New device is a Financial risk analyst Identity ADX XL DR, model [id number], seriel [id number].  Marland Kitchen Appendectomy  1966  . Tonsillectomy  1953   Family History  Problem Relation Age of Onset  . Cancer Mother     breast  . CVA Father   . Anemia Brother   . Heart disease Brother     Social History  Substance Use Topics  . Smoking status: Former Smoker -- 1.50 packs/day for 25 years    Types: Cigarettes    Quit date: 03/11/1968  . Smokeless tobacco: Never Used  . Alcohol Use: No    Review of Systems  Constitutional: Negative for fever.  Gastrointestinal: Positive for constipation. Negative for abdominal pain and flatus.      Allergies  Spironolactone  Home Medications   Prior to Admission medications   Medication Sig Start Date End Date Taking? Authorizing Provider  Cholecalciferol (VITAMIN D) 2000 UNITS tablet Take 2,000 Units by mouth daily.   Yes Historical Provider, MD  ELIQUIS 5 MG TABS tablet TAKE 1 TABLET (5 MG TOTAL) BY MOUTH 2 (TWO) TIMES DAILY. 03/14/15  Yes Belva Crome, MD  esomeprazole (NEXIUM) 40 MG capsule Take 40 mg by mouth daily at 12 noon.   Yes Historical Provider, MD  fluticasone (FLONASE) 50 MCG/ACT nasal spray Place 2 sprays into the nose daily as needed for allergies. 05/27/12  Yes Daniel J Angiulli, PA-C  furosemide (LASIX) 40 MG tablet Take 0.5 tablets (20 mg total) by mouth daily. 08/16/14  Yes Belva Crome, MD  glipiZIDE (GLUCOTROL) 5 MG tablet Take 5 mg by mouth daily before breakfast.   Yes Historical Provider, MD  lisinopril (PRINIVIL,ZESTRIL) 5 MG tablet Take 5 mg by mouth daily.   Yes Historical Provider, MD  Multiple Vitamin (  MULTIVITAMIN WITH MINERALS) TABS Take 1 tablet by mouth daily. 05/27/12  Yes Daniel J Angiulli, PA-C  simvastatin (ZOCOR) 40 MG tablet Take 1 tablet (40 mg total) by mouth at bedtime. 05/27/12  Yes Daniel J Angiulli, PA-C  terazosin (HYTRIN) 5 MG capsule Take 1 capsule (5 mg total) by mouth at bedtime. 05/27/12  Yes Daniel J Angiulli, PA-C  docusate sodium (COLACE) 100 MG capsule Take 1 capsule (100 mg total) by mouth every 12 (twelve) hours. 05/23/15   Merrily Pew, MD  polyethylene glycol (MIRALAX / GLYCOLAX) packet Take 17 g by mouth daily as needed for moderate constipation. 05/23/15   Merrily Pew, MD    BP 123/74 mmHg  Pulse 55  Temp(Src) 97.6 F (36.4 C) (Oral)  Resp 17  SpO2 98% Physical Exam  ED Course  Procedures (including critical care time) Labs Review Labs Reviewed  CBC - Abnormal; Notable for the following:    WBC 20.1 (*)    Platelets 139 (*)    All other components within normal limits  COMPREHENSIVE METABOLIC PANEL - Abnormal; Notable for the following:    Glucose, Bld 160 (*)    BUN 21 (*)    GFR calc non Af Amer 55 (*)    All other components within normal limits  LIPASE, BLOOD    Imaging Review No results found. I have personally reviewed and evaluated these images and lab results as part of my medical decision-making.   EKG Interpretation None      MDM   Final diagnoses:  Constipation, unspecified constipation type    Patient with history of constipation, doesn't take anything regularly to help it. Had BM prior to my evaluation and is symptom free. Hb and K normal. Has a leukocytosis of unknown cause, has h/o same. No infectious or B symptoms to require workup in ED. Will follow up with PCP for further evaluation of his leukocytosis and start some motility agents.     Merrily Pew, MD 05/25/15 [phone]

## 2015-08-28 ENCOUNTER — Ambulatory Visit (INDEPENDENT_AMBULATORY_CARE_PROVIDER_SITE_OTHER): Payer: Medicare Other | Admitting: Physician Assistant

## 2015-08-28 ENCOUNTER — Encounter: Payer: Self-pay | Admitting: Physician Assistant

## 2015-08-28 VITALS — BP 116/68 | HR 62 | Ht 70.0 in | Wt 213.0 lb

## 2015-08-28 DIAGNOSIS — I10 Essential (primary) hypertension: Secondary | ICD-10-CM

## 2015-08-28 DIAGNOSIS — I4819 Other persistent atrial fibrillation: Secondary | ICD-10-CM

## 2015-08-28 DIAGNOSIS — I429 Cardiomyopathy, unspecified: Secondary | ICD-10-CM

## 2015-08-28 DIAGNOSIS — I495 Sick sinus syndrome: Secondary | ICD-10-CM

## 2015-08-28 DIAGNOSIS — I481 Persistent atrial fibrillation: Secondary | ICD-10-CM | POA: Diagnosis not present

## 2015-08-28 DIAGNOSIS — I442 Atrioventricular block, complete: Secondary | ICD-10-CM

## 2015-08-28 DIAGNOSIS — I428 Other cardiomyopathies: Secondary | ICD-10-CM

## 2015-08-28 NOTE — Patient Instructions (Addendum)
Medication Instructions:   Your physician recommends that you continue on your current medications as directed. Please refer to the Current Medication list given to you today.    If you need a refill on your cardiac medications before your next appointment, please call your pharmacy.  Labwork:  NONE ORDER TODAY    Testing/Procedures:  NONE ORDER TODAY    Follow-Up: in 3 MONTHS WITH [facility]RED   IN 4 WEEKS WITH DEVICE CLINIC FOR NEAR ERI     Any Other Special Instructions Will Be Listed Below (If Applicable).

## 2015-08-28 NOTE — Progress Notes (Signed)
Patient ID: Randall Odom, male   DOB: Nov 08, 1926, 82 y.o.   MRN: [id number]    Cardiology Office Note Date:  08/28/2015  Patient ID:  Randall Odom, DOB [birth year], MRN [id number] PCP:  Tivis Ringer, MD  Cardiologist:  Dr. Tamala Julian Electrophysiologist: Dr. Rayann Heman   Chief Complaint: routine EP/pacer visit  History of Present Illness: Randall Odom is a 82 y.o. male with history of permanent AFib, CHB with PPM, HTN, DM, chronic CHF (mixed), HLD, emphysema, CAD/coronary calcifications mentioned in previous notes, comes in today being seen for Dr. Rayann Heman.  He was last seen by EP service, Chanetta Marshall, NP in December 2016 and doing well at that time.  He is feeling well, denies any kind of CP, palpitations or SOB, no dizziness, near syncope or syncope. No symptoms of PND or orthopnea.  He is very sedentary, he and his wife state just because he doesn't feel like doing much, not because of any kind of symptomatology.  He denies any bleeding or signs of bleeding, no excessive bruising.  His only c/o is that his family no longer lets him drive.   Past Medical History  Diagnosis Date  . Hypertension   . Diabetes mellitus   . Enlarged prostate   . CAD (coronary artery disease)   . Complete heart block ([place])   . Erectile dysfunction   . Thoracic or lumbosacral neuritis or radiculitis, unspecified   . Depressive disorder, not elsewhere classified   . Carpal tunnel syndrome   . Allergic rhinitis   . Diverticulosis of colon (without mention of hemorrhage)   . Sacroiliitis, not elsewhere classified ([place])   . Atrial fibrillation with controlled ventricular response ([place])   . Recurrent falls   . Chronic combined systolic and diastolic CHF (congestive heart failure) ([place])   . Glaucoma     History of glaucoma  . History of BPH   . DJD (degenerative joint disease)   . Anxiety   . Pure hypercholesterolemia     Past Surgical History  Procedure Laterality Date  . Pacemaker insertion  09/28/1997     DDD for high-grade AV Block. Had an upgrade to a dual chamber device on 09/09/97. Replaced dual chamber battery on 12/06/04. New device is a Financial risk analyst Identity ADX XL DR, model [id number], seriel [id number].  Marland Kitchen Appendectomy  1966  . Tonsillectomy  1953    Current Outpatient Prescriptions  Medication Sig Dispense Refill  . Cholecalciferol (VITAMIN D) 2000 UNITS tablet Take 2,000 Units by mouth daily.    Marland Kitchen ELIQUIS 5 MG TABS tablet TAKE 1 TABLET (5 MG TOTAL) BY MOUTH 2 (TWO) TIMES DAILY. 60 tablet 6  . esomeprazole (NEXIUM) 40 MG capsule Take 40 mg by mouth daily at 12 noon.    . fluticasone (FLONASE) 50 MCG/ACT nasal spray Place 2 sprays into the nose daily as needed for allergies.    . furosemide (LASIX) 40 MG tablet Take 0.5 tablets (20 mg total) by mouth daily. 30 tablet 6  . glipiZIDE (GLUCOTROL) 5 MG tablet Take 5 mg by mouth daily before breakfast.    . lisinopril (PRINIVIL,ZESTRIL) 5 MG tablet Take 5 mg by mouth daily.    . Multiple Vitamin (MULTIVITAMIN WITH MINERALS) TABS Take 1 tablet by mouth daily.    . polyethylene glycol (MIRALAX / GLYCOLAX) packet Take 17 g by mouth daily as needed for moderate constipation. 14 each 0  . simvastatin (ZOCOR) 40 MG tablet Take 1 tablet (40 mg total) by mouth at bedtime.  30 tablet 1  . terazosin (HYTRIN) 5 MG capsule Take 1 capsule (5 mg total) by mouth at bedtime. 30 capsule 1   No current facility-administered medications for this visit.    Allergies:   Spironolactone   Social History:  The patient  reports that he quit smoking about 47 years ago. His smoking use included Cigarettes. He has a 37.5 pack-year smoking history. He has never used smokeless tobacco. He reports that he does not drink alcohol or use illicit drugs.   Family History:  The patient's family history includes Anemia in his brother; CVA in his father; Cancer in his mother; Heart disease in his brother.  ROS:  Please see the history of present illness.   All other systems are  reviewed and otherwise negative.   PHYSICAL EXAM:  VS:  BP 116/68 mmHg  Pulse 62  Ht 5\' 10"  (1.778 m)  Wt 213 lb (96.616 kg)  BMI 30.56 kg/m2 BMI: Body mass index is 30.56 kg/(m^2). Well nourished, well developed, obese, in no acute distress HEENT: normocephalic, atraumatic Neck: no JVD, carotid bruits or masses Cardiac:  normal S1, S2; RRR (paced); no significant murmurs, no rubs, or gallops Lungs:  clear to auscultation bilaterally, no wheezing, rhonchi or rales Abd: soft, nontender MS: no deformity, age appropriate atrophy Ext: no edema Skin: warm and dry, no rash Neuro:  No gross deficits appreciated Psych: euthymic mood, full affect  PPM site is stable, no tethering or discomfort (R sided)   EKG:  Done todau is  AFib, V paced PPM check today: intact function, programmed VVI, not pacer dependednt, battery is nearing ERI  07/15/14: Echocardiogram Study Conclusions - Left ventricle: The cavity size was normal. There was moderate  concentric hypertrophy. Systolic function was mildly reduced. The  estimated ejection fraction was in the range of 45% to 50%. There  is akinesis of the apicalanteroseptal and apical myocardium. - Aortic valve: Cusp separation was reduced. There was mild  regurgitation. - Mitral valve: Calcified annulus. - Left atrium: The atrium was mildly dilated. Impressions: - Compared to the prior study, there has been no significant  interval change.  Myoview Impression 10/2013 Exercise Capacity: Adenosine study with no exercise. BP Response: Normal blood pressure response. Clinical Symptoms: No symptoms. ECG Impression: v-paced Comparison with Prior Nuclear Study: No images to compare Overall Impression: Low risk stress nuclear study with a medium-sized, moderate inferior and inferoseptal perfusion defect. EF is 48% but hypokinesis appears global without regionality. Possible prior inferior/inferoseptal infarction, no evidence for ischemia.  . LV Ejection Fraction: 48%. LV Wall Motion: Mild global hypokinesis. Loralie Champagne 10/13/2013   Recent Labs: 05/23/2015: ALT 28; BUN 21*; Creatinine, Ser 1.16; Hemoglobin 13.8; Platelets 139*; Potassium 3.8; Sodium 141  No results found for requested labs within last 365 days.   CrCl cannot be calculated (Patient has no serum creatinine result on file.).   Wt Readings from Last 3 Encounters:  08/28/15 213 lb (96.616 kg)  03/01/15 212 lb 9.6 oz (96.435 kg)  07/11/14 212 lb 3.2 oz (96.253 kg)     Other studies reviewed: Additional studies/records reviewed today include: summarized above  Device History: STJ single chamber PPM implanted 1989 for CHB; upgrade to STJ dual chamber PPM 1999; gen change 2006, currently programmed VVI  ASSESSMENT AND PLAN:   1. Permanent AFib/CHB with PPM     CHA2DS2Vasc is at least 5 on Eliquis     no bleeding or signs of bleeding reported     05/23/15 Creat 1.16,  H/H stable     intact pacer function, battery is nearing ERI     He is not pacer dependent  2. HTN     Appears well controlled  3. NICM, chronic combined CHF     exam is euvolemic     Very sedentary life denies exertional intolerances     On   ACE, diuretic tx,     Will defer to his primary cardiologist any BB therapy/add on  4. HLD     Per the patient's wife is monitored and managed by his PMD     LFTs March OK   Disposition:Will schedule a pacer check in 4 weeks with the device clinic, and tenatively Dr. Rayann Heman in 90mo pending his battery status, sooner if needed.  Current medicines are reviewed at length with the patient today.  The patient did not have any concerns regarding medicines.  Haywood Lasso, PA-C 08/28/2015 2:52 PM     [place] [employer] [place] [address] [phone] (office)  [phone] (fax)

## 2015-09-27 ENCOUNTER — Ambulatory Visit (INDEPENDENT_AMBULATORY_CARE_PROVIDER_SITE_OTHER): Payer: Medicare Other | Admitting: *Deleted

## 2015-09-27 DIAGNOSIS — I495 Sick sinus syndrome: Secondary | ICD-10-CM

## 2015-09-27 LAB — CUP PACEART INCLINIC DEVICE CHECK
Battery Impedance: 30000 Ohm
Battery Remaining Longevity: 0 mo
Battery Voltage: 2.54 V
Brady Statistic RV Percent Paced: 77 %
Implantable Lead Implant Date: 19990721
Implantable Lead Location: 753859
Lead Channel Pacing Threshold Amplitude: 1.75 V
Lead Channel Setting Sensing Sensitivity: 2 mV
MDC IDC LEAD IMPLANT DT: 19990721
MDC IDC LEAD LOCATION: 753860
MDC IDC MSMT LEADCHNL RV IMPEDANCE VALUE: 442 Ohm
MDC IDC MSMT LEADCHNL RV PACING THRESHOLD PULSEWIDTH: 0.5 ms
MDC IDC PG SERIAL: 1110590
MDC IDC SESS DTM: 20170719122738
MDC IDC SET LEADCHNL RV PACING PULSEWIDTH: 0.5 ms

## 2015-09-27 NOTE — Progress Notes (Signed)
Pacemaker check in clinic. Normal device function. Threshold sensing, impedance consistent with previous measurements. Device programmed to maximize longevity No high ventricular rates noted. Device programmed at appropriate safety margins. Histogram distribution appropriate for patient activity level. Device programmed to optimize intrinsic conduction. Estimated longevity <35months. Battery 2.54V (ERI 2.5V).  ROV with device for battery check 10/30/2015. Patient education completed.

## 2015-10-06 ENCOUNTER — Encounter: Payer: Self-pay | Admitting: Internal Medicine

## 2015-10-06 ENCOUNTER — Other Ambulatory Visit: Payer: Self-pay | Admitting: Interventional Cardiology

## 2015-10-19 ENCOUNTER — Other Ambulatory Visit: Payer: Self-pay | Admitting: Interventional Cardiology

## 2015-10-19 ENCOUNTER — Other Ambulatory Visit: Payer: Self-pay

## 2015-10-19 MED ORDER — APIXABAN 5 MG PO TABS: ORAL_TABLET | ORAL | 11 refills | Status: DC

## 2015-10-30 ENCOUNTER — Encounter: Payer: Self-pay | Admitting: Internal Medicine

## 2015-10-30 ENCOUNTER — Ambulatory Visit (INDEPENDENT_AMBULATORY_CARE_PROVIDER_SITE_OTHER): Payer: Medicare Other | Admitting: *Deleted

## 2015-10-30 DIAGNOSIS — Z950 Presence of cardiac pacemaker: Secondary | ICD-10-CM

## 2015-10-30 LAB — CUP PACEART INCLINIC DEVICE CHECK
Battery Remaining Longevity: 0 mo
Implantable Lead Implant Date: 19990721
Implantable Lead Location: 753859
Implantable Lead Location: 753860
Lead Channel Pacing Threshold Amplitude: 2 V
Lead Channel Pacing Threshold Pulse Width: 0.5 ms
Lead Channel Sensing Intrinsic Amplitude: 5.2 mV
MDC IDC LEAD IMPLANT DT: 19990721
MDC IDC MSMT BATTERY IMPEDANCE: 31600 Ohm
MDC IDC MSMT BATTERY VOLTAGE: 2.39 V
MDC IDC MSMT LEADCHNL RV IMPEDANCE VALUE: 427 Ohm
MDC IDC PG SERIAL: 1110590
MDC IDC SESS DTM: 20170821151942
MDC IDC SET LEADCHNL RV PACING AMPLITUDE: 4 V
MDC IDC SET LEADCHNL RV PACING PULSEWIDTH: 0.5 ms
MDC IDC SET LEADCHNL RV SENSING SENSITIVITY: 2 mV
MDC IDC STAT BRADY RV PERCENT PACED: 73 %

## 2015-10-30 NOTE — Progress Notes (Signed)
Pacemaker check in clinic. Normal device function, ERI reached 09/28/15. Thresholds, sensing, impedances consistent with previous measurements. Device programmed to maximize longevity. No episode triggers enabled. Device programmed at appropriate safety margins; RV output reduced to 4.0V @ 0.67ms (previously on auto capture). Histogram distribution appropriate for patient activity level. Device programmed to optimize intrinsic conduction. Patient education completed. ROV with JA on 11/29/15 to discuss generator change.

## 2015-11-10 ENCOUNTER — Telehealth: Payer: Self-pay | Admitting: Nurse Practitioner

## 2015-11-10 NOTE — Telephone Encounter (Signed)
Pacemaker at [facility].  Per Dr Rayann Heman, ok to schedule gen change without office visit.  Spoke with wife.  Gen change scheduled for Thursday, 9/7.  Pt to arrive at [facility] after midnight. Hold Eliquis night before and morning of procedure. Wife aware and agrees with plan. Appt for 9/6 cancelled.  Chanetta Marshall, NP 11/10/2015 9:45 AM

## 2015-11-15 ENCOUNTER — Encounter: Payer: Medicare Other | Admitting: Internal Medicine

## 2015-11-16 ENCOUNTER — Ambulatory Visit (HOSPITAL_COMMUNITY)
Admission: RE | Disposition: A | Discharge status: Home / Self Care | Payer: Self-pay | Source: Ambulatory Visit | Attending: Internal Medicine

## 2015-11-16 ENCOUNTER — Encounter (HOSPITAL_COMMUNITY): Payer: Self-pay | Admitting: Internal Medicine

## 2015-11-16 ENCOUNTER — Ambulatory Visit (HOSPITAL_COMMUNITY)
Admission: RE | Admit: 2015-11-16 | Discharge: 2015-11-16 | Disposition: A | Discharge status: Home / Self Care | Payer: Medicare Other | Source: Ambulatory Visit | Attending: Internal Medicine | Admitting: Internal Medicine

## 2015-11-16 DIAGNOSIS — N400 Enlarged prostate without lower urinary tract symptoms: Secondary | ICD-10-CM | POA: Insufficient documentation

## 2015-11-16 DIAGNOSIS — E7800 Pure hypercholesterolemia, unspecified: Secondary | ICD-10-CM | POA: Diagnosis not present

## 2015-11-16 DIAGNOSIS — Z45010 Encounter for checking and testing of cardiac pacemaker pulse generator [battery]: Secondary | ICD-10-CM | POA: Diagnosis not present

## 2015-11-16 DIAGNOSIS — Z823 Family history of stroke: Secondary | ICD-10-CM | POA: Insufficient documentation

## 2015-11-16 DIAGNOSIS — I4891 Unspecified atrial fibrillation: Secondary | ICD-10-CM | POA: Diagnosis present

## 2015-11-16 DIAGNOSIS — I110 Hypertensive heart disease with heart failure: Secondary | ICD-10-CM | POA: Insufficient documentation

## 2015-11-16 DIAGNOSIS — F419 Anxiety disorder, unspecified: Secondary | ICD-10-CM | POA: Insufficient documentation

## 2015-11-16 DIAGNOSIS — J309 Allergic rhinitis, unspecified: Secondary | ICD-10-CM | POA: Insufficient documentation

## 2015-11-16 DIAGNOSIS — Z7951 Long term (current) use of inhaled steroids: Secondary | ICD-10-CM | POA: Insufficient documentation

## 2015-11-16 DIAGNOSIS — I2510 Atherosclerotic heart disease of native coronary artery without angina pectoris: Secondary | ICD-10-CM | POA: Diagnosis not present

## 2015-11-16 DIAGNOSIS — E119 Type 2 diabetes mellitus without complications: Secondary | ICD-10-CM | POA: Insufficient documentation

## 2015-11-16 DIAGNOSIS — I442 Atrioventricular block, complete: Secondary | ICD-10-CM | POA: Diagnosis not present

## 2015-11-16 DIAGNOSIS — E785 Hyperlipidemia, unspecified: Secondary | ICD-10-CM | POA: Insufficient documentation

## 2015-11-16 DIAGNOSIS — I428 Other cardiomyopathies: Secondary | ICD-10-CM | POA: Diagnosis not present

## 2015-11-16 DIAGNOSIS — Z8249 Family history of ischemic heart disease and other diseases of the circulatory system: Secondary | ICD-10-CM | POA: Insufficient documentation

## 2015-11-16 DIAGNOSIS — Z7901 Long term (current) use of anticoagulants: Secondary | ICD-10-CM | POA: Insufficient documentation

## 2015-11-16 DIAGNOSIS — I482 Chronic atrial fibrillation: Secondary | ICD-10-CM | POA: Diagnosis not present

## 2015-11-16 DIAGNOSIS — F329 Major depressive disorder, single episode, unspecified: Secondary | ICD-10-CM | POA: Insufficient documentation

## 2015-11-16 DIAGNOSIS — Z87891 Personal history of nicotine dependence: Secondary | ICD-10-CM | POA: Diagnosis not present

## 2015-11-16 DIAGNOSIS — M1990 Unspecified osteoarthritis, unspecified site: Secondary | ICD-10-CM | POA: Diagnosis not present

## 2015-11-16 DIAGNOSIS — I5042 Chronic combined systolic (congestive) and diastolic (congestive) heart failure: Secondary | ICD-10-CM | POA: Diagnosis not present

## 2015-11-16 HISTORY — PX: EP IMPLANTABLE DEVICE: SHX172B

## 2015-11-16 LAB — BASIC METABOLIC PANEL
ANION GAP: 10 (ref 5–15)
BUN: 11 mg/dL (ref 6–20)
CALCIUM: 9.4 mg/dL (ref 8.9–10.3)
CO2: 24 mmol/L (ref 22–32)
Chloride: 108 mmol/L (ref 101–111)
Creatinine, Ser: 1.11 mg/dL (ref 0.61–1.24)
GFR, EST NON AFRICAN AMERICAN: 58 mL/min — AB (ref >60)
Glucose, Bld: 192 mg/dL — ABNORMAL HIGH (ref 65–99)
POTASSIUM: 3.7 mmol/L (ref 3.5–5.1)
Sodium: 142 mmol/L (ref 135–145)

## 2015-11-16 LAB — CBC
HEMATOCRIT: 44.1 % (ref 39.0–52.0)
Hemoglobin: 14.5 g/dL (ref 13.0–17.0)
MCH: 31.7 pg (ref 26.0–34.0)
MCHC: 32.9 g/dL (ref 30.0–36.0)
MCV: 96.5 fL (ref 78.0–100.0)
PLATELETS: 162 10*3/uL (ref 150–400)
RBC: 4.57 MIL/uL (ref 4.22–5.81)
RDW: 13.2 % (ref 11.5–15.5)
WBC: 11.6 10*3/uL — AB (ref 4.0–10.5)

## 2015-11-16 LAB — SURGICAL PCR SCREEN
MRSA, PCR: NEGATIVE
Staphylococcus aureus: NEGATIVE

## 2015-11-16 SURGERY — PPM/BIV PPM GENERATOR CHANGEOUT
Anesthesia: LOCAL

## 2015-11-16 MED ORDER — SODIUM CHLORIDE 0.9% FLUSH: 3.0000 mL | Freq: Two times a day (BID) | INTRAVENOUS | Status: DC

## 2015-11-16 MED ORDER — SODIUM CHLORIDE 0.9 % IV SOLN
INTRAVENOUS | Status: DC
  Administered 2015-11-16: 09:00:00 via INTRAVENOUS

## 2015-11-16 MED ORDER — SODIUM CHLORIDE 0.9% FLUSH: 3.0000 mL | INTRAVENOUS | Status: DC | PRN

## 2015-11-16 MED ORDER — CHLORHEXIDINE GLUCONATE 4 % EX LIQD: 60.0000 mL | Freq: Once | CUTANEOUS | Status: DC

## 2015-11-16 MED ORDER — ACETAMINOPHEN 325 MG PO TABS
325.0000 mg | ORAL_TABLET | ORAL | Status: DC | PRN
Start: 2015-11-16 — End: 2015-11-16
  Filled 2015-11-16: qty 2

## 2015-11-16 MED ORDER — HEPARIN (PORCINE) IN NACL 2-0.9 UNIT/ML-% IJ SOLN
INTRAMUSCULAR | Status: DC | PRN
  Administered 2015-11-16: 500 mL via INTRA_ARTERIAL

## 2015-11-16 MED ORDER — LIDOCAINE HCL (PF) 1 % IJ SOLN
INTRAMUSCULAR | Status: DC | PRN
  Administered 2015-11-16: 31 mL via INTRADERMAL

## 2015-11-16 MED ORDER — MUPIROCIN 2 % EX OINT
TOPICAL_OINTMENT | CUTANEOUS | Status: AC
  Administered 2015-11-16: 1 via TOPICAL
  Filled 2015-11-16: qty 22

## 2015-11-16 MED ORDER — CEFAZOLIN SODIUM-DEXTROSE 2-4 GM/100ML-% IV SOLN
INTRAVENOUS | Status: AC
  Filled 2015-11-16: qty 100

## 2015-11-16 MED ORDER — MIDAZOLAM HCL 5 MG/5ML IJ SOLN
INTRAMUSCULAR | Status: DC | PRN
  Administered 2015-11-16: 1 mg via INTRAVENOUS

## 2015-11-16 MED ORDER — SODIUM CHLORIDE 0.9 % IR SOLN
Status: AC
  Filled 2015-11-16: qty 2

## 2015-11-16 MED ORDER — CEFAZOLIN SODIUM-DEXTROSE 2-4 GM/100ML-% IV SOLN
2.0000 g | INTRAVENOUS | Status: AC
  Administered 2015-11-16: 2 g via INTRAVENOUS

## 2015-11-16 MED ORDER — SODIUM CHLORIDE 0.9 % IR SOLN
80.0000 mg | Status: AC
  Administered 2015-11-16: 80 mg

## 2015-11-16 MED ORDER — SODIUM CHLORIDE 0.9 % IV SOLN: 250.0000 mL | INTRAVENOUS | Status: DC | PRN

## 2015-11-16 MED ORDER — MUPIROCIN 2 % EX OINT
1.0000 "application " | TOPICAL_OINTMENT | Freq: Once | CUTANEOUS | Status: AC
  Administered 2015-11-16: 1 via TOPICAL

## 2015-11-16 MED ORDER — FENTANYL CITRATE (PF) 100 MCG/2ML IJ SOLN
INTRAMUSCULAR | Status: AC
  Filled 2015-11-16: qty 2

## 2015-11-16 MED ORDER — MIDAZOLAM HCL 5 MG/5ML IJ SOLN
INTRAMUSCULAR | Status: AC
  Filled 2015-11-16: qty 5

## 2015-11-16 MED ORDER — LIDOCAINE HCL (PF) 1 % IJ SOLN
INTRAMUSCULAR | Status: AC
  Filled 2015-11-16: qty 60

## 2015-11-16 MED ORDER — ONDANSETRON HCL 4 MG/2ML IJ SOLN: 4.0000 mg | Freq: Four times a day (QID) | INTRAMUSCULAR | Status: DC | PRN

## 2015-11-16 MED ORDER — APIXABAN 5 MG PO TABS: ORAL_TABLET | ORAL | 11 refills | Status: DC

## 2015-11-16 SURGICAL SUPPLY — 4 items
CABLE SURGICAL S-101-97-12 (CABLE) ×2 IMPLANT
PACEMAKER ASSURITY SR-SF (Pacemaker) ×2 IMPLANT
PAD DEFIB LIFELINK (PAD) ×2 IMPLANT
TRAY PACEMAKER INSERTION (PACKS) ×2 IMPLANT

## 2015-11-16 NOTE — Discharge Instructions (Signed)
Pacemaker Battery Change, Care After Refer to this sheet in the next few weeks. These instructions provide you with information on caring for yourself after your procedure. Your health care provider may also give you more specific instructions. Your treatment has been planned according to current medical practices, but problems sometimes occur. Call your health care provider if you have any problems or questions after your procedure. WHAT TO EXPECT AFTER THE PROCEDURE After your procedure, it is typical to have the following sensations:  Soreness at the pacemaker site. HOME CARE INSTRUCTIONS   Keep the incision clean and dry.  Unless advised otherwise, keep dry till after your 10 day follow up at the cardiologists office.  For the first week after the replacement, avoid stretching motions that pull at the incision site, and avoid heavy exercise with the arm that is on the same side as the incision.  Take medicines only as directed by your health care provider.  Keep all follow-up visits as directed by your health care provider. SEEK MEDICAL CARE IF:   You have pain at the incision site that is not relieved by over-the-counter or prescription medicine.  There is drainage or pus from the incision site.  There is swelling larger than a lime at the incision site.  You develop red streaking that extends above or below the incision site.  You feel brief, intermittent palpitations, light-headedness, or any symptoms that you feel might be related to your heart. SEEK IMMEDIATE MEDICAL CARE IF:   You experience chest pain that is different than the pain at the pacemaker site.  You experience shortness of breath.  You have palpitations or irregular heartbeat.  You have light-headedness that does not go away quickly.  You faint.  You have pain that gets worse and is not relieved by medicine.   This information is not intended to replace advice given to you by your health care provider.  Make sure you discuss any questions you have with your health care provider.   Document Released: 12/16/2012 Document Revised: 03/18/2014 Document Reviewed: 12/16/2012 Elsevier Interactive Patient Education [employer].

## 2015-11-16 NOTE — H&P (Signed)
History of Present Illness: Randall Odom is a 82 y.o. male with history of permanent AFib, CHB with PPM, HTN, DM, chronic CHF (mixed), HLD, emphysema, who presents today for pacemaker generator change.  He has reached ERI.  He is s/p prior gen change by Dr Leonia Reeves 2006.  That was his 3rd procedure.  He is s/p prior upgrade to a dual chamber device in 1999. Currently he CP, palpitations or SOB (above baseline), no dizziness, near syncope or syncope.  He is not very active.  He denies any bleeding or signs of bleeding, no excessive bruising.  His only c/o is that his family no longer lets him drive.        Past Medical History  Diagnosis Date  . Hypertension   . Diabetes mellitus   . Enlarged prostate   . CAD (coronary artery disease)   . Complete heart block ([place])   . Erectile dysfunction   . Thoracic or lumbosacral neuritis or radiculitis, unspecified   . Depressive disorder, not elsewhere classified   . Carpal tunnel syndrome   . Allergic rhinitis   . Diverticulosis of colon (without mention of hemorrhage)   . Sacroiliitis, not elsewhere classified ([place])   . Atrial fibrillation with controlled ventricular response ([place])   . Recurrent falls   . Chronic combined systolic and diastolic CHF (congestive heart failure) ([place])   . Glaucoma     History of glaucoma  . History of BPH   . DJD (degenerative joint disease)   . Anxiety   . Pure hypercholesterolemia           Past Surgical History  Procedure Laterality Date  . Pacemaker insertion  09/28/1997    DDD for high-grade AV Block. Had an upgrade to a dual chamber device on 09/09/97. Replaced dual chamber battery on 12/06/04. New device is a Financial risk analyst Identity ADX XL DR, model [id number], seriel [id number].  Marland Kitchen Appendectomy  1966  . Tonsillectomy  1953          Current Outpatient Prescriptions  Medication Sig Dispense Refill  . Cholecalciferol (VITAMIN D) 2000 UNITS tablet Take 2,000 Units by mouth daily.     Marland Kitchen ELIQUIS 5 MG TABS tablet TAKE 1 TABLET (5 MG TOTAL) BY MOUTH 2 (TWO) TIMES DAILY. 60 tablet 6  . esomeprazole (NEXIUM) 40 MG capsule Take 40 mg by mouth daily at 12 noon.    . fluticasone (FLONASE) 50 MCG/ACT nasal spray Place 2 sprays into the nose daily as needed for allergies.    . furosemide (LASIX) 40 MG tablet Take 0.5 tablets (20 mg total) by mouth daily. 30 tablet 6  . glipiZIDE (GLUCOTROL) 5 MG tablet Take 5 mg by mouth daily before breakfast.    . lisinopril (PRINIVIL,ZESTRIL) 5 MG tablet Take 5 mg by mouth daily.    . Multiple Vitamin (MULTIVITAMIN WITH MINERALS) TABS Take 1 tablet by mouth daily.    . polyethylene glycol (MIRALAX / GLYCOLAX) packet Take 17 g by mouth daily as needed for moderate constipation. 14 each 0  . simvastatin (ZOCOR) 40 MG tablet Take 1 tablet (40 mg total) by mouth at bedtime. 30 tablet 1  . terazosin (HYTRIN) 5 MG capsule Take 1 capsule (5 mg total) by mouth at bedtime. 30 capsule 1   No current facility-administered medications for this visit.    Allergies:   Spironolactone   Social History:  The patient  reports that he quit smoking about 47 years ago. His smoking use included Cigarettes. He  has a 37.5 pack-year smoking history. He has never used smokeless tobacco. He reports that he does not drink alcohol or use illicit drugs.   Family History:  The patient's family history includes Anemia in his brother; CVA in his father; Cancer in his mother; Heart disease in his brother.  ROS:  Please see the history of present illness.   All other systems are reviewed and otherwise negative.   PHYSICAL EXAM:  Vitals:   11/16/15 0827  BP: 117/68  Pulse: 66  Resp: 16  Temp: 97.7 F (36.5 C)   Well nourished, well developed, obese, in no acute distress  HEENT: normocephalic, atraumatic  Neck: no JVD, carotid bruits or masses Cardiac:  normal S1, S2; RRR (paced); no significant murmurs, no rubs, or gallops Lungs:  clear to  auscultation bilaterally, no wheezing, rhonchi or rales  Abd: soft, nontender MS: no deformity, age appropriate atrophy Ext: no edema  Skin: warm and dry, no rash Neuro:  No gross deficits appreciated Psych: euthymic mood, full affect  PPM site is stable, no tethering or discomfort (R sided)  CXR is reviewed Prior device interrogation is reviewed  07/15/14: Echocardiogram Study Conclusions - Left ventricle: The cavity size was normal. There was moderate  concentric hypertrophy. Systolic function was mildly reduced. The  estimated ejection fraction was in the range of 45% to 50%. There  is akinesis of the apicalanteroseptal and apical myocardium. - Aortic valve: Cusp separation was reduced. There was mild  regurgitation. - Mitral valve: Calcified annulus. - Left atrium: The atrium was mildly dilated. Impressions: - Compared to the prior study, there has been no significant  interval change.  Myoview Impression 10/2013 Exercise Capacity: Adenosine study with no exercise. BP Response: Normal blood pressure response. Clinical Symptoms: No symptoms. ECG Impression: v-paced Comparison with Prior Nuclear Study: No images to compare Overall Impression: Low risk stress nuclear study with a medium-sized, moderate inferior and inferoseptal perfusion defect. EF is 48% but hypokinesis appears global without regionality. Possible prior inferior/inferoseptal infarction, no evidence for ischemia. . LV Ejection Fraction: 48%. LV Wall Motion: Mild global hypokinesis. Loralie Champagne 10/13/2013  Lab s pending     Wt Readings from Last 3 Encounters:  08/28/15 213 lb (96.616 kg)  03/01/15 212 lb 9.6 oz (96.435 kg)  07/11/14 212 lb 3.2 oz (96.253 kg)     Device History: STJ single chamber PPM implanted 1989 for CHB; upgrade to STJ dual chamber PPM 1999; gen change 2006, currently programmed VVI  ASSESSMENT AND PLAN:   1. Permanent AFib/CHB with PPM      CHA2DS2Vasc is at least 5 on Eliquis     no bleeding or signs of bleeding reported     He has reached ERI battery status.  Risks, benefits, and alternatives to pacemaker pulse generator replacement were discussed in detail today.  The patient understands that risks include but are not limited to bleeding, infection, pneumothorax, perforation, tamponade, vascular damage, renal failure, MI, stroke, death, damage to his existing leads, and lead dislodgement and wishes to proceed.   He would like to avoid lead replacement if at all possible.  2. HTN     Appears well controlled  3. NICM, chronic combined CHF     exam is euvolemic     Very sedentary life denies exertional intolerances     On   ACE, diuretic tx,  Thompson Grayer MD, [facility] 11/16/2015 8:52 AM

## 2015-11-17 MED FILL — Fentanyl Citrate Preservative Free (PF) Inj 100 MCG/2ML: INTRAMUSCULAR | Qty: 2 | Status: AC

## 2015-11-29 ENCOUNTER — Encounter: Payer: Self-pay | Admitting: Internal Medicine

## 2015-11-29 ENCOUNTER — Ambulatory Visit (INDEPENDENT_AMBULATORY_CARE_PROVIDER_SITE_OTHER): Payer: Medicare Other | Admitting: *Deleted

## 2015-11-29 ENCOUNTER — Encounter: Payer: Medicare Other | Admitting: Internal Medicine

## 2015-11-29 DIAGNOSIS — Z950 Presence of cardiac pacemaker: Secondary | ICD-10-CM

## 2015-11-29 NOTE — Progress Notes (Signed)
Wound check appointment. Steri-strips removed. Wound without redness or edema. Incision edges approximated, wound well healed. Normal device function. Device checked by industry base rate changed from 60bpm to 70 bpm per JA .Device programmed with autocapture on. Histogram distribution appropriate for patient and level of activity. No ventricular arrhythmias noted. Patient educated about wound care, arm mobility. ROV with JA 02/19/2016.

## 2016-02-19 ENCOUNTER — Encounter: Payer: Self-pay | Admitting: Internal Medicine

## 2016-02-19 ENCOUNTER — Ambulatory Visit (INDEPENDENT_AMBULATORY_CARE_PROVIDER_SITE_OTHER): Payer: Medicare Other | Admitting: Internal Medicine

## 2016-02-19 ENCOUNTER — Encounter (INDEPENDENT_AMBULATORY_CARE_PROVIDER_SITE_OTHER): Payer: Self-pay

## 2016-02-19 VITALS — BP 116/50 | HR 64 | Ht 67.0 in | Wt 214.8 lb

## 2016-02-19 DIAGNOSIS — Z950 Presence of cardiac pacemaker: Secondary | ICD-10-CM | POA: Diagnosis not present

## 2016-02-19 DIAGNOSIS — I5042 Chronic combined systolic (congestive) and diastolic (congestive) heart failure: Secondary | ICD-10-CM | POA: Diagnosis not present

## 2016-02-19 DIAGNOSIS — I442 Atrioventricular block, complete: Secondary | ICD-10-CM | POA: Diagnosis not present

## 2016-02-19 DIAGNOSIS — I481 Persistent atrial fibrillation: Secondary | ICD-10-CM

## 2016-02-19 DIAGNOSIS — I4819 Other persistent atrial fibrillation: Secondary | ICD-10-CM

## 2016-02-19 LAB — CUP PACEART INCLINIC DEVICE CHECK
Battery Voltage: 3.02 V
Date Time Interrogation Session: 20171211163757
Implantable Pulse Generator Implant Date: 20170907
Lead Channel Impedance Value: 687.5 Ohm
Lead Channel Pacing Threshold Amplitude: 1.75 V
Lead Channel Pacing Threshold Pulse Width: 0.5 ms
Lead Channel Setting Pacing Amplitude: 2 V
MDC IDC LEAD IMPLANT DT: 19990721
MDC IDC LEAD LOCATION: 753860
MDC IDC MSMT LEADCHNL RV SENSING INTR AMPL: 10.8 mV
MDC IDC SET LEADCHNL RV PACING PULSEWIDTH: 0.5 ms
MDC IDC SET LEADCHNL RV SENSING SENSITIVITY: 2 mV
MDC IDC STAT BRADY RV PERCENT PACED: 82 %
Pulse Gen Serial Number: 7880882

## 2016-02-19 NOTE — Patient Instructions (Addendum)
Medication Instructions:  Your physician recommends that you continue on your current medications as directed. Please refer to the Current Medication list given to you today.   Labwork: None Ordered   Testing/Procedures: None Ordered   Follow-Up: Your physician wants you to follow-up with: Dr. Tamala Julian in February 2018 and 12 months with Chanetta Marshall, NP. You will receive a reminder letter in the mail two months in advance. If you don't receive a letter, please call our office to schedule the follow-up appointment.  Remote monitoring is used to monitor your Pacemaker from home. This monitoring reduces the number of office visits required to check your device to one time per year. It allows [place] to keep an eye on the functioning of your device to ensure it is working properly. You are scheduled for a device check from home on 05/20/16. You may send your transmission at any time that day. If you have a wireless device, the transmission will be sent automatically. After your physician reviews your transmission, you will receive a postcard with your next transmission date.    Any Other Special Instructions Will Be Listed Below (If Applicable).     If you need a refill on your cardiac medications before your next appointment, please call your pharmacy.

## 2016-02-19 NOTE — Progress Notes (Signed)
PCP: Tivis Ringer, MD Primary Cardiologist:  Dr Tamala Julian Primary EP:  Dr Bosie Clos is a 82 y.o. male with a h/o complete heart block sp PPM (SJM) by Dr Leonia Reeves who presents today for follow-up in the Electrophysiology device clinic.  He initially underwent PPM in 1989.  He underwent upgrade to dual chamber device 09/28/1997.  His most recent generator replacement was 12/06/2004. His most recent PPM generator change was by me 11/2015.   He has permanent atrial fibrillation and is presently anticoagulated with eliquis.  He has SOB with moderate activity which is stable.  He is doing well for his age. Today, he  denies symptoms of palpitations, chest pain,  orthopnea, PND, lower extremity edema, dizziness, presyncope, syncope, or neurologic sequela.  The patientis tolerating medications without difficulties and is otherwise without complaint today.   Past Medical History:  Diagnosis Date  . Allergic rhinitis   . Anxiety   . Atrial fibrillation with controlled ventricular response ([place])   . CAD (coronary artery disease)   . Carpal tunnel syndrome   . Chronic combined systolic and diastolic CHF (congestive heart failure) ([place])   . Complete heart block ([place])   . Depressive disorder, not elsewhere classified   . Diabetes mellitus   . Diverticulosis of colon (without mention of hemorrhage)   . DJD (degenerative joint disease)   . Enlarged prostate   . Erectile dysfunction   . Glaucoma    History of glaucoma  . History of BPH   . Hypertension   . Pure hypercholesterolemia   . Recurrent falls   . Sacroiliitis, not elsewhere classified ([place])   . Thoracic or lumbosacral neuritis or radiculitis, unspecified    Past Surgical History:  Procedure Laterality Date  . APPENDECTOMY  1966  . EP IMPLANTABLE DEVICE N/A 11/16/2015   PPM generator change by Dr Rayann Heman,  SJM Assurity SR PPM for complete heart block  . PACEMAKER INSERTION  09/28/1997   DDD for high-grade AV Block. Had an  upgrade to a dual chamber device on 09/09/97. Replaced dual chamber battery on 12/06/04. New device is a Financial risk analyst Identity ADX XL DR, model [id number], seriel [id number].  . TONSILLECTOMY  1953    Social History   Social History  . Marital status: Married    Spouse name: N/A  . Number of children: N/A  . Years of education: N/A   Occupational History  . Retired    Social History Main Topics  . Smoking status: Former Smoker    Packs/day: 1.50    Years: 25.00    Types: Cigarettes    Quit date: 03/11/1968  . Smokeless tobacco: Never Used  . Alcohol use No  . Drug use: No  . Sexual activity: Not on file   Other Topics Concern  . Not on file   Social History Narrative  . No narrative on file    Family History  Problem Relation Age of Onset  . Cancer Mother     breast  . CVA Father   . Anemia Brother   . Heart disease Brother     Allergies  Allergen Reactions  . Spironolactone Diarrhea, Nausea And Vomiting and Other (See Comments)    Stopped after 2 doses due to vomiting and diarrhea    Current Outpatient Prescriptions  Medication Sig Dispense Refill  . apixaban (ELIQUIS) 5 MG TABS tablet TAKE 1 TABLET (5 MG TOTAL) BY MOUTH 2 (TWO) TIMES DAILY. 60 tablet 11  . Cholecalciferol (  VITAMIN D) 2000 UNITS tablet Take 2,000 Units by mouth daily.    Marland Kitchen esomeprazole (NEXIUM) 40 MG capsule Take 40 mg by mouth daily at 12 noon.    . fluticasone (FLONASE) 50 MCG/ACT nasal spray Place 2 sprays into the nose daily as needed for allergies.    . furosemide (LASIX) 40 MG tablet TAKE 0.5 TABLETS (20 MG TOTAL) BY MOUTH DAILY. 45 tablet 2  . glipiZIDE (GLUCOTROL) 5 MG tablet Take 5 mg by mouth daily before breakfast.    . lisinopril (PRINIVIL,ZESTRIL) 5 MG tablet Take 5 mg by mouth daily.    . Multiple Vitamin (MULTIVITAMIN WITH MINERALS) TABS Take 1 tablet by mouth daily.    . polyethylene glycol (MIRALAX / GLYCOLAX) packet Take 17 g by mouth daily as needed for moderate constipation. 14 each 0    . simvastatin (ZOCOR) 40 MG tablet Take 1 tablet (40 mg total) by mouth at bedtime. 30 tablet 1  . terazosin (HYTRIN) 5 MG capsule Take 1 capsule (5 mg total) by mouth at bedtime. 30 capsule 1   No current facility-administered medications for this visit.     ROS- all systems are reviewed and negative except as per HPI  Physical Exam: Vitals:   02/19/16 1340  BP: (!) 116/50  Pulse: 64  SpO2: 90%  Weight: 214 lb 12.8 oz (97.4 kg)  Height: 5\' 7"  (1.702 m)    GEN- The patient is elderly appearing, alert and oriented x 3 today.   Head- normocephalic, atraumatic Eyes-  Sclera clear, conjunctiva pink Ears- hearing intact Oropharynx- clear Neck- supple  Lungs- Clear to ausculation bilaterally, normal work of breathing Chest- pacemaker pocket is well healed Heart- Regular rate and rhythm (paced) GI- soft, NT, ND, + BS Extremities- no clubbing, cyanosis, + edema MS- age appropriate muscle atrophy Skin- no rash or lesion Psych- euthymic mood, full affect Neuro- strength and sensation are intact  Pacemaker interrogation- reviewed in detail today,  See PACEART report  Assessment and Plan:  1. Complete heart block Normal pacemaker function See Pace Art report  2. Permanent afib Doing well with eliquis Rate controlled  3. Chronic systolic dysfunction Compensated presently No changes today Followed by Dr Tamala Julian  Follow-up with Dr Tamala Julian as scheduled [place] Return to see EP NP in 1 year  Thompson Grayer MD 02/19/2016 2:24 PM

## 2016-02-26 ENCOUNTER — Other Ambulatory Visit: Payer: Self-pay | Admitting: Internal Medicine

## 2016-02-26 DIAGNOSIS — R9389 Abnormal findings on diagnostic imaging of other specified body structures: Secondary | ICD-10-CM

## 2016-03-27 ENCOUNTER — Inpatient Hospital Stay: Admission: RE | Admit: 2016-03-27 | Payer: Medicare Other | Source: Ambulatory Visit

## 2016-04-01 ENCOUNTER — Inpatient Hospital Stay: Admission: RE | Admit: 2016-04-01 | Payer: Medicare Other | Source: Ambulatory Visit

## 2016-05-20 ENCOUNTER — Ambulatory Visit: Payer: Medicare Other | Admitting: Interventional Cardiology

## 2016-05-20 ENCOUNTER — Ambulatory Visit (INDEPENDENT_AMBULATORY_CARE_PROVIDER_SITE_OTHER): Payer: Medicare Other | Admitting: *Deleted

## 2016-05-20 DIAGNOSIS — I442 Atrioventricular block, complete: Secondary | ICD-10-CM

## 2016-05-21 NOTE — Progress Notes (Signed)
Remote pacemaker transmission.   

## 2016-05-22 ENCOUNTER — Encounter: Payer: Self-pay | Admitting: Cardiology

## 2016-05-22 LAB — CUP PACEART REMOTE DEVICE CHECK
Battery Remaining Longevity: 115 mo
Battery Voltage: 3.01 V
Brady Statistic RV Percent Paced: 86 %
Date Time Interrogation Session: 20180314143837
Implantable Lead Implant Date: 19990721
Implantable Lead Location: 753860
Lead Channel Impedance Value: 580 Ohm
Lead Channel Pacing Threshold Pulse Width: 0.5 ms
Lead Channel Sensing Intrinsic Amplitude: 12 mV
MDC IDC MSMT BATTERY REMAINING PERCENTAGE: 95 % — AB
MDC IDC MSMT LEADCHNL RV PACING THRESHOLD AMPLITUDE: 1.375 V
MDC IDC PG IMPLANT DT: 20170907
MDC IDC PG SERIAL: 7880882
MDC IDC SET LEADCHNL RV PACING AMPLITUDE: 1.625
MDC IDC SET LEADCHNL RV PACING PULSEWIDTH: 0.5 ms
MDC IDC SET LEADCHNL RV SENSING SENSITIVITY: 2 mV

## 2016-06-07 IMAGING — CT CT CHEST W/O CM
2 of 3 series · 15 of 36 positions shown, 18 images · non-contrast
Comparison: CT chest dated 03/25/2014.

CLINICAL DATA: Productive cough with exertion, shortness of breath,
follow-up abnormal CT chest

EXAM:
CT CHEST WITHOUT CONTRAST
TECHNIQUE: Multidetector CT imaging of the chest was performed following the
standard protocol without IV contrast.

[Series 2: thorax · axial · 0.82mm/px · z∈[-328,-58]mm · 12 of 64 slices shown, 15 images]
[im 5/64  mediastinal]
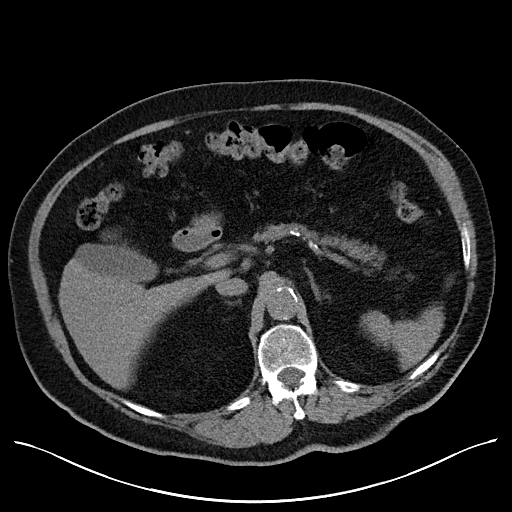
[im 5/64  lung]
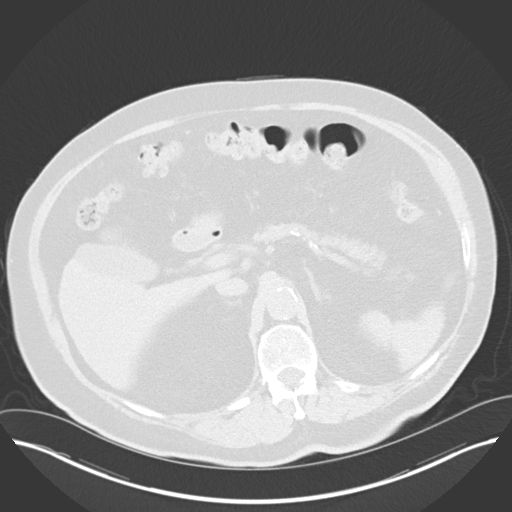
[im 10/64  lung]
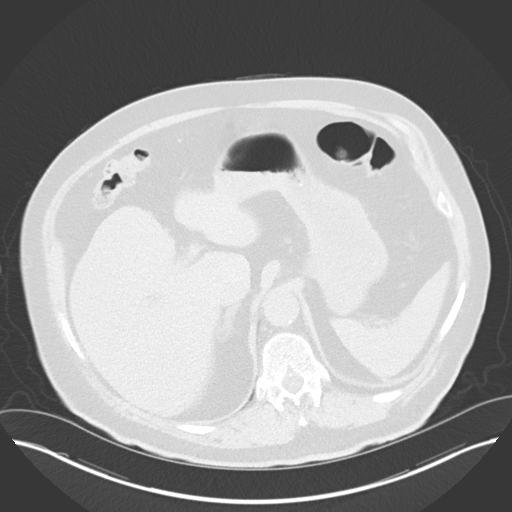
[im 15/64  lung]
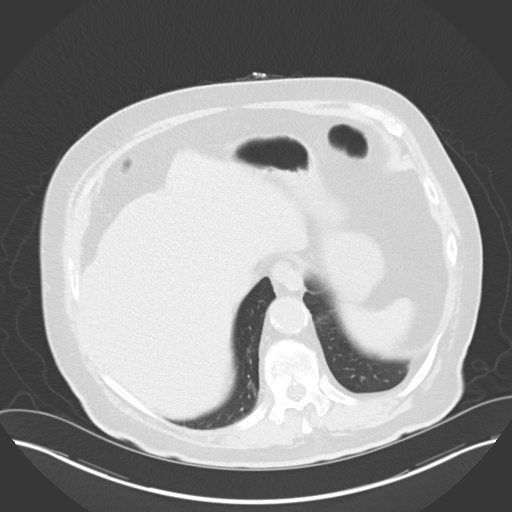
[im 19/64  lung]
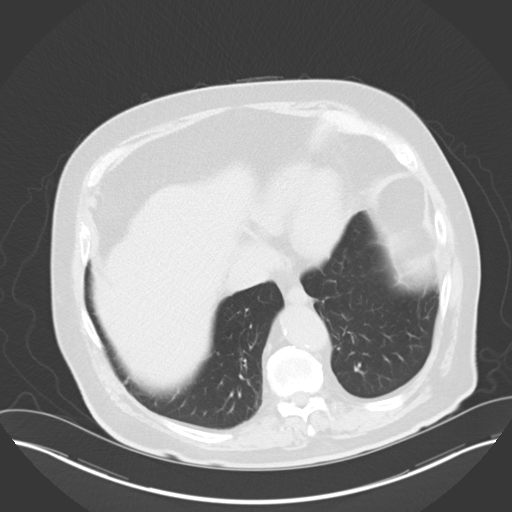
[im 24/64  mediastinal]
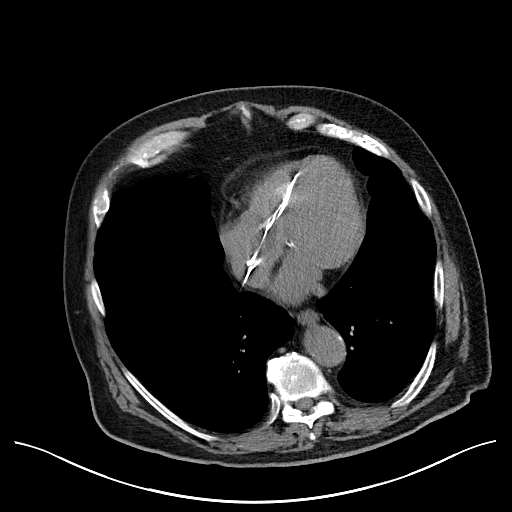
[im 24/64  lung]
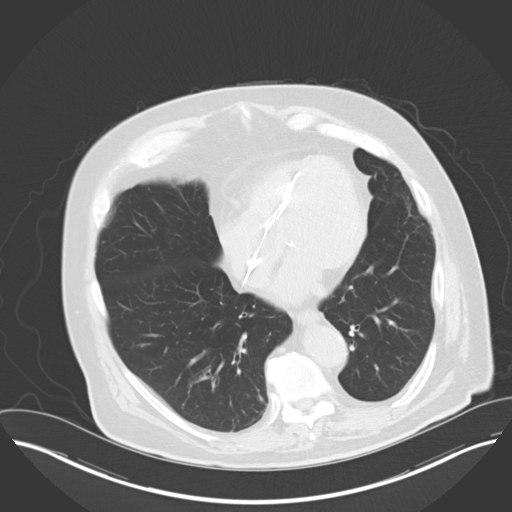
[im 29/64  lung]
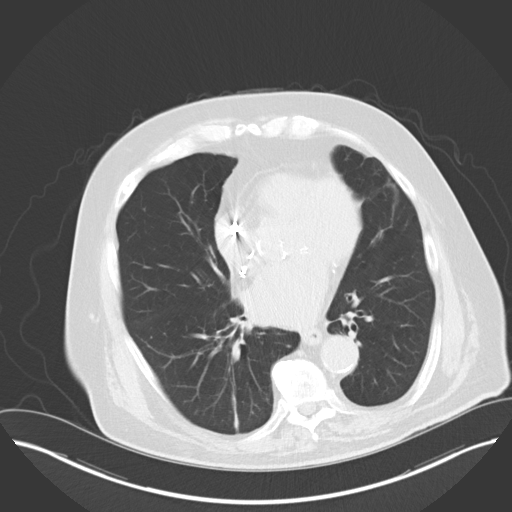
[im 36/64  lung]
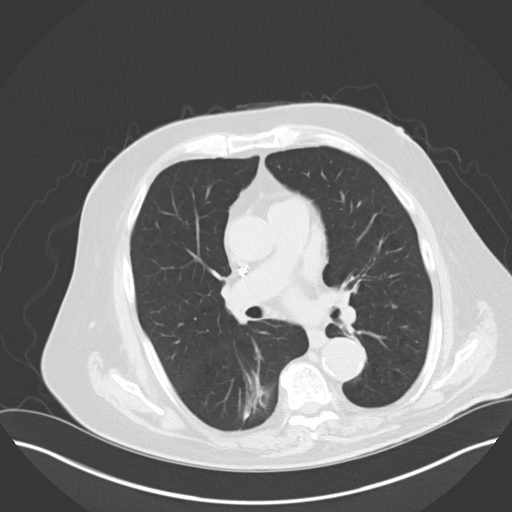
[im 40/64  lung]
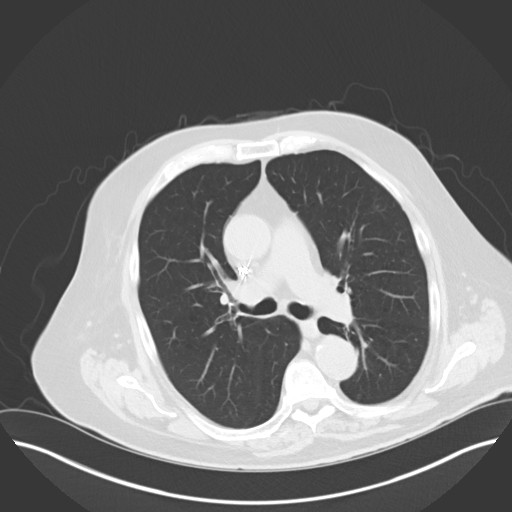
[im 45/64  mediastinal]
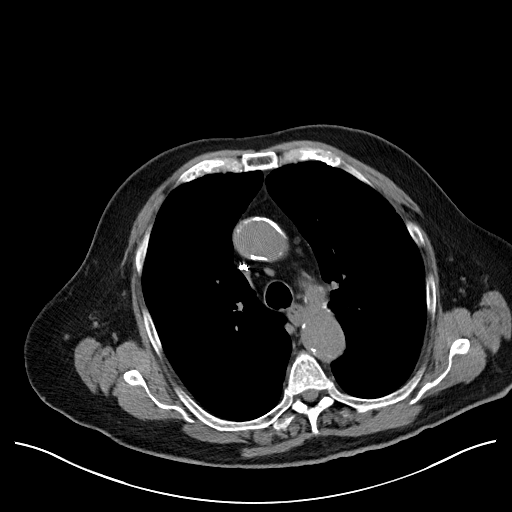
[im 45/64  lung]
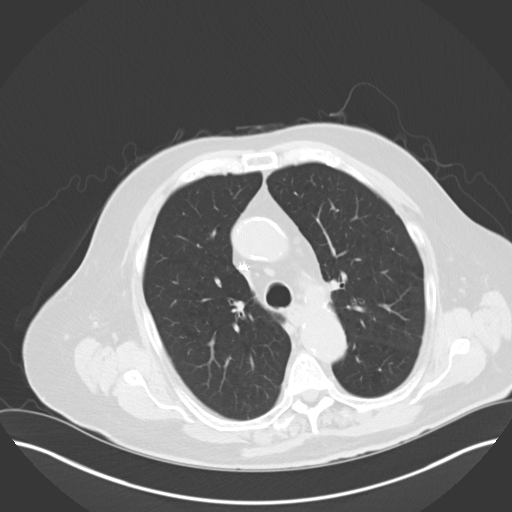
[im 50/64  lung]
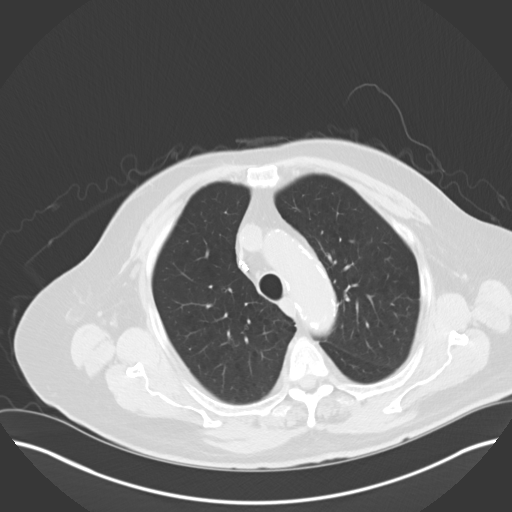
[im 54/64  lung]
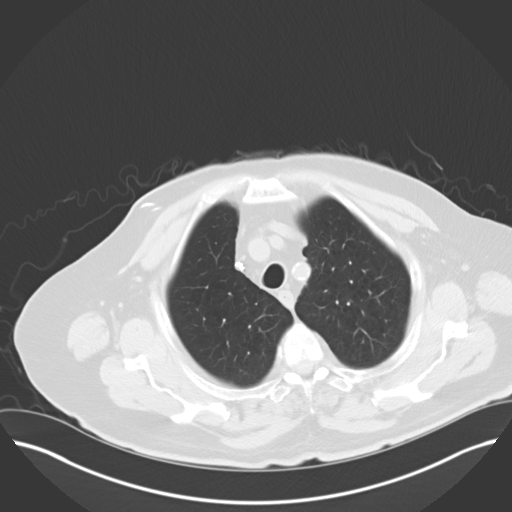
[im 59/64  lung]
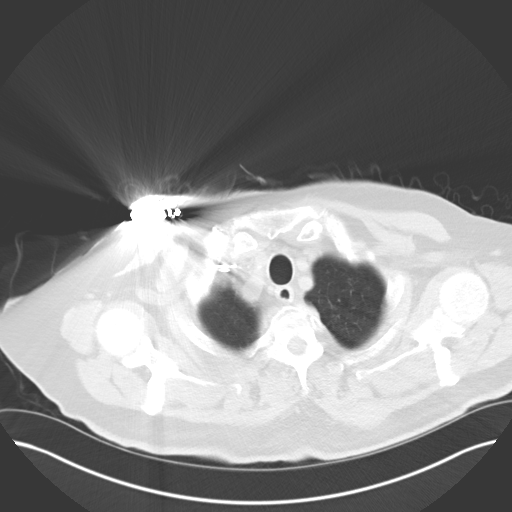

[Series 5: coronal · coronal · 0.62mm/px · 3 of 148 slices shown]
[im 30/148  lung]
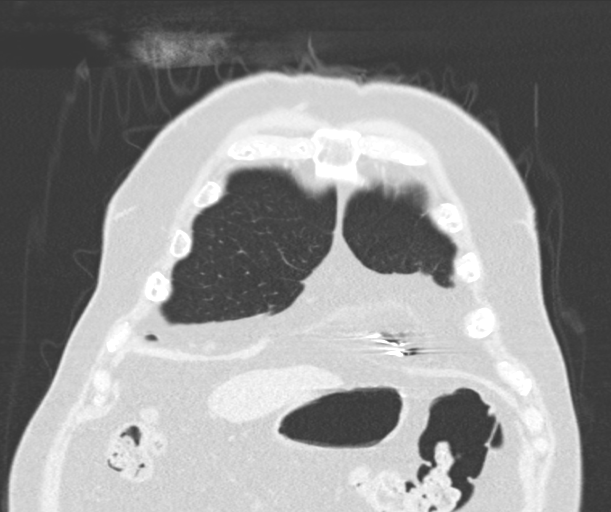
[im 59/148  lung]
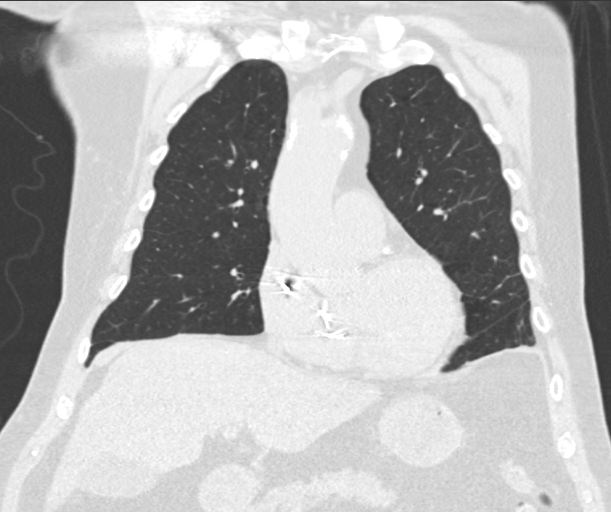
[im 89/148  lung]
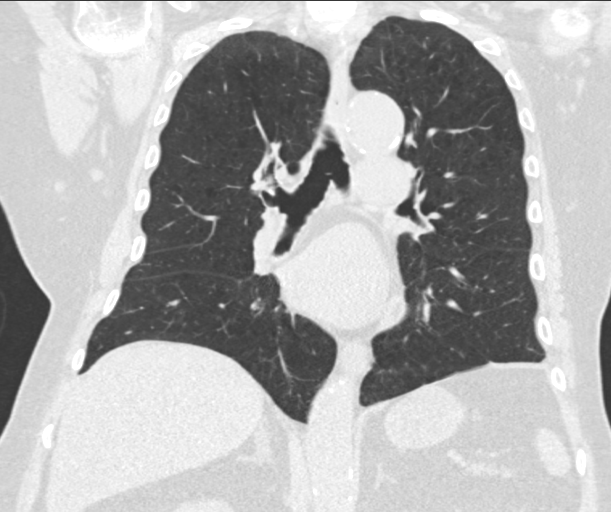

[15 of 36 positions shown; findings below may reference images not displayed]

PET-CT dated 09/30/2013. CT
chest dated 09/21/2013. Partial comparison to CT abdomen pelvis
dated 02/18/2010.
FINDINGS: Mediastinum/Nodes: The heart is normal in size. No pericardial
effusion.

Coronary atherosclerosis.

Atherosclerotic calcifications of the aortic arch. Right subclavian
pacemaker.

Small mediastinal lymph nodes measuring up to 5 mm short axis,
within normal limits.

Visualized thyroid is unremarkable.

Lungs/Pleura: 2.0 x 2.6 cm thick-walled cavitary lesion in the
posterior aspect of the superior segment right lower lobe (series 3/
image 32), previously 2.1 x 2.4 cm in 8732, grossly unchanged.
Associated bronchiectasis/bronchial wall thickening leading to the
lesion. Only mild hypermetabolism was present on prior PET-CT in
9255. A similar cavitary lesion was present on prior 9866 CT abdomen
pelvis, measuring 1.7 x 2.0 cm, noting mildly decreased peripheral
nodularity at that time.

Overall, although the current CT appearance remains mildly
suspicious, the relative indolent growth continues to favor a benign
etiology.

Underlying mild centrilobular and paraseptal emphysematous changes.

No new/ suspicious pulmonary nodules.

No focal consolidation.

No pleural effusion or pneumothorax.

Upper abdomen: Visualized upper abdomen is notable for mild hepatic
steatosis and vascular calcifications.

Musculoskeletal: Degenerative changes of the visualized
thoracolumbar spine.
IMPRESSION: 2.0 x 2.6 cm thick-walled cavitary lesion in the right lower lobe,
with relative indolent growth since 9866, favoring a benign
etiology.

If continued imaging surveillance is desired, consider follow-up CT
chest without contrast in 12 months.

## 2016-07-09 NOTE — Progress Notes (Signed)
Cardiology Office Note    Date:  07/10/2016   ID:  Randall Odom, DOB 07-08-1926, MRN [id number]  PCP:  Tivis Ringer, MD  Cardiologist: Sinclair Grooms, MD   No chief complaint on file.   History of Present Illness:  Randall Odom is a 83 y.o. male who presents for follow-up of Chronic combined systolic and diastolic heart failure, chronic atrial fibrillation, and known coronary calcification. He is on chronic anticoagulation without complications. He has chronic dyspnea.  He has no complaints. Son and wife or accompanying him today. He denies dyspnea, orthopnea, PND, edema, and chest pain. No palpitations. He denies syncope.   Past Medical History:  Diagnosis Date  . Allergic rhinitis   . Anxiety   . Atrial fibrillation with controlled ventricular response ([place])   . CAD (coronary artery disease)   . Carpal tunnel syndrome   . Chronic combined systolic and diastolic CHF (congestive heart failure) ([place])   . Complete heart block ([place])   . Depressive disorder, not elsewhere classified   . Diabetes mellitus   . Diverticulosis of colon (without mention of hemorrhage)   . DJD (degenerative joint disease)   . Enlarged prostate   . Erectile dysfunction   . Glaucoma    History of glaucoma  . History of BPH   . Hypertension   . Pure hypercholesterolemia   . Recurrent falls   . Sacroiliitis, not elsewhere classified ([place])   . Thoracic or lumbosacral neuritis or radiculitis, unspecified     Past Surgical History:  Procedure Laterality Date  . APPENDECTOMY  1966  . EP IMPLANTABLE DEVICE N/A 11/16/2015   PPM generator change by Dr Rayann Heman,  SJM Assurity SR PPM for complete heart block  . PACEMAKER INSERTION  09/28/1997   DDD for high-grade AV Block. Had an upgrade to a dual chamber device on 09/09/97. Replaced dual chamber battery on 12/06/04. New device is a Financial risk analyst Identity ADX XL DR, model [id number], seriel [id number].  . TONSILLECTOMY  1953    Current Medications: Outpatient  Medications Prior to Visit  Medication Sig Dispense Refill  . apixaban (ELIQUIS) 5 MG TABS tablet TAKE 1 TABLET (5 MG TOTAL) BY MOUTH 2 (TWO) TIMES DAILY. 60 tablet 11  . Cholecalciferol (VITAMIN D) 2000 UNITS tablet Take 2,000 Units by mouth daily.    Marland Kitchen esomeprazole (NEXIUM) 40 MG capsule Take 40 mg by mouth daily at 12 noon.    . fluticasone (FLONASE) 50 MCG/ACT nasal spray Place 2 sprays into the nose daily as needed for allergies.    . furosemide (LASIX) 40 MG tablet TAKE 0.5 TABLETS (20 MG TOTAL) BY MOUTH DAILY. 45 tablet 2  . glipiZIDE (GLUCOTROL) 5 MG tablet Take 5 mg by mouth daily before breakfast.    . lisinopril (PRINIVIL,ZESTRIL) 5 MG tablet Take 5 mg by mouth daily.    . Multiple Vitamin (MULTIVITAMIN WITH MINERALS) TABS Take 1 tablet by mouth daily.    . polyethylene glycol (MIRALAX / GLYCOLAX) packet Take 17 g by mouth daily as needed for moderate constipation. 14 each 0  . simvastatin (ZOCOR) 40 MG tablet Take 1 tablet (40 mg total) by mouth at bedtime. 30 tablet 1  . terazosin (HYTRIN) 5 MG capsule Take 1 capsule (5 mg total) by mouth at bedtime. 30 capsule 1   No facility-administered medications prior to visit.      Allergies:   Spironolactone   Social History   Social History  . Marital status: Married  Spouse name: N/A  . Number of children: N/A  . Years of education: N/A   Occupational History  . Retired    Social History Main Topics  . Smoking status: Former Smoker    Packs/day: 1.50    Years: 25.00    Types: Cigarettes    Quit date: 03/11/1968  . Smokeless tobacco: Never Used  . Alcohol use No  . Drug use: No  . Sexual activity: Not Asked   Other Topics Concern  . None   Social History Narrative  . None     Family History:  The patient's family history includes Anemia in his brother; CVA in his father; Cancer in his mother; Heart disease in his brother.   ROS:   Please see the history of present illness.    Constipation, back discomfort, and  difficulty with his memory.  All other systems reviewed and are negative.   PHYSICAL EXAM:   VS:  BP (!) 108/56 (BP Location: Left Arm)   Pulse 73   Ht 5\' 7"  (1.702 m)   Wt 218 lb 3.2 oz (99 kg)   BMI 34.17 kg/m    GEN: Well nourished, well developed, in no acute distress  HEENT: normal  Neck: no JVD, carotid bruits, or masses Cardiac: IIRR; no murmurs, rubs, or gallops,no edema  Respiratory:  clear to auscultation bilaterally, normal work of breathing GI: soft, nontender, nondistended, + BS MS: no deformity or atrophy  Skin: warm and dry, no rash Neuro:  Alert and Oriented x 3, Strength and sensation are intact Psych: euthymic mood, full affect  Wt Readings from Last 3 Encounters:  07/10/16 218 lb 3.2 oz (99 kg)  02/19/16 214 lb 12.8 oz (97.4 kg)  11/16/15 215 lb (97.5 kg)      Studies/Labs Reviewed:   EKG:  EKG  Not performed. Tracing performed in December at the time of EP clinic visit refilled ventricular pacing with PVCs and baseline atrial fibrillation.  Recent Labs: 11/16/2015: BUN 11; Creatinine, Ser 1.11; Hemoglobin 14.5; Platelets 162; Potassium 3.7; Sodium 142   Lipid Panel No results found for: CHOL, TRIG, HDL, CHOLHDL, VLDL, LDLCALC, LDLDIRECT  Additional studies/ records that were reviewed today include:  No new data    ASSESSMENT:    1. Coronary artery disease involving native coronary artery of native heart without angina pectoris   2. Chronic combined systolic and diastolic heart failure ([place])   3. Essential hypertension   4. Permanent atrial fibrillation ([place])   5. Chronic anticoagulation      PLAN:  In order of problems listed above:  1. No symptoms of angina. Diagnosis is based upon presence of calcification on CT. 2. No evidence of volume overload on exam. 3. Low normal blood pressure, asymptomatic. 4. Excellent rate control. 5. Continue Eliquis for stroke prevention. CHA2DS2Vasc is at least 5   One-year follow-up  Medication  Adjustments/Labs and Tests Ordered: Current medicines are reviewed at length with the patient today.  Concerns regarding medicines are outlined above.  Medication changes, Labs and Tests ordered today are listed in the Patient Instructions below. Patient Instructions  Medication Instructions:  None  Labwork: None  Testing/Procedures: None  Follow-Up: Your physician wants you to follow-up in: 1 year with Dr. Tamala Julian.  You will receive a reminder letter in the mail two months in advance. If you don't receive a letter, please call our office to schedule the follow-up appointment.   Any Other Special Instructions Will Be Listed Below (If Applicable).  If you need a refill on your cardiac medications before your next appointment, please call your pharmacy.      Signed, Sinclair Grooms, MD  07/10/2016 1:42 PM    [facility] [address], [place], [place]  [address] Phone: [phone]; Fax: [phone]

## 2016-07-10 ENCOUNTER — Encounter (INDEPENDENT_AMBULATORY_CARE_PROVIDER_SITE_OTHER): Payer: Self-pay

## 2016-07-10 ENCOUNTER — Ambulatory Visit (INDEPENDENT_AMBULATORY_CARE_PROVIDER_SITE_OTHER): Payer: Medicare Other | Admitting: Interventional Cardiology

## 2016-07-10 ENCOUNTER — Encounter: Payer: Self-pay | Admitting: Interventional Cardiology

## 2016-07-10 VITALS — BP 108/56 | HR 73 | Ht 67.0 in | Wt 218.2 lb

## 2016-07-10 DIAGNOSIS — I10 Essential (primary) hypertension: Secondary | ICD-10-CM

## 2016-07-10 DIAGNOSIS — I482 Chronic atrial fibrillation: Secondary | ICD-10-CM

## 2016-07-10 DIAGNOSIS — Z7901 Long term (current) use of anticoagulants: Secondary | ICD-10-CM | POA: Diagnosis not present

## 2016-07-10 DIAGNOSIS — I2510 Atherosclerotic heart disease of native coronary artery without angina pectoris: Secondary | ICD-10-CM | POA: Diagnosis not present

## 2016-07-10 DIAGNOSIS — I5042 Chronic combined systolic (congestive) and diastolic (congestive) heart failure: Secondary | ICD-10-CM

## 2016-07-10 DIAGNOSIS — I4821 Permanent atrial fibrillation: Secondary | ICD-10-CM

## 2016-07-10 NOTE — Patient Instructions (Signed)

## 2016-08-19 ENCOUNTER — Ambulatory Visit (INDEPENDENT_AMBULATORY_CARE_PROVIDER_SITE_OTHER): Payer: Medicare Other | Admitting: *Deleted

## 2016-08-19 DIAGNOSIS — I442 Atrioventricular block, complete: Secondary | ICD-10-CM

## 2016-08-19 NOTE — Progress Notes (Signed)
Remote pacemaker transmission.   

## 2016-08-21 ENCOUNTER — Encounter: Payer: Self-pay | Admitting: Cardiology

## 2016-08-21 LAB — CUP PACEART REMOTE DEVICE CHECK
Battery Remaining Longevity: 118 mo
Battery Remaining Percentage: 95.5 %
Implantable Lead Implant Date: 19990721
Implantable Lead Location: 753860
Lead Channel Pacing Threshold Amplitude: 1.5 V
Lead Channel Pacing Threshold Pulse Width: 0.5 ms
Lead Channel Sensing Intrinsic Amplitude: 6.5 mV
Lead Channel Setting Pacing Pulse Width: 0.5 ms
Lead Channel Setting Sensing Sensitivity: 2 mV
MDC IDC MSMT BATTERY VOLTAGE: 3.01 V
MDC IDC MSMT LEADCHNL RV IMPEDANCE VALUE: 560 Ohm
MDC IDC PG IMPLANT DT: 20170907
MDC IDC PG SERIAL: 7880882
MDC IDC SESS DTM: 20180611081910
MDC IDC SET LEADCHNL RV PACING AMPLITUDE: 1.75 V
MDC IDC STAT BRADY RV PERCENT PACED: 86 %

## 2016-08-29 ENCOUNTER — Inpatient Hospital Stay (HOSPITAL_COMMUNITY)
Admission: EM | Admit: 2016-08-29 | Discharge: 2016-09-02 | DRG: 193 | Disposition: A | Discharge status: Skilled Nursing Facility | Payer: Medicare Other | Attending: Internal Medicine | Admitting: Internal Medicine

## 2016-08-29 ENCOUNTER — Encounter: Payer: Self-pay | Admitting: Internal Medicine

## 2016-08-29 ENCOUNTER — Ambulatory Visit (INDEPENDENT_AMBULATORY_CARE_PROVIDER_SITE_OTHER): Payer: Medicare Other | Admitting: Internal Medicine

## 2016-08-29 ENCOUNTER — Encounter (HOSPITAL_COMMUNITY): Payer: Self-pay | Admitting: Emergency Medicine

## 2016-08-29 ENCOUNTER — Emergency Department (HOSPITAL_COMMUNITY): Payer: Medicare Other

## 2016-08-29 VITALS — BP 110/60 | HR 75 | Temp 97.8°F | Ht 67.0 in | Wt 206.0 lb

## 2016-08-29 DIAGNOSIS — Z7984 Long term (current) use of oral hypoglycemic drugs: Secondary | ICD-10-CM

## 2016-08-29 DIAGNOSIS — R918 Other nonspecific abnormal finding of lung field: Secondary | ICD-10-CM | POA: Diagnosis not present

## 2016-08-29 DIAGNOSIS — Z79899 Other long term (current) drug therapy: Secondary | ICD-10-CM

## 2016-08-29 DIAGNOSIS — I482 Chronic atrial fibrillation: Secondary | ICD-10-CM | POA: Diagnosis present

## 2016-08-29 DIAGNOSIS — J9621 Acute and chronic respiratory failure with hypoxia: Secondary | ICD-10-CM | POA: Diagnosis present

## 2016-08-29 DIAGNOSIS — D869 Sarcoidosis, unspecified: Secondary | ICD-10-CM | POA: Diagnosis present

## 2016-08-29 DIAGNOSIS — I10 Essential (primary) hypertension: Secondary | ICD-10-CM | POA: Diagnosis present

## 2016-08-29 DIAGNOSIS — E0810 Diabetes mellitus due to underlying condition with ketoacidosis without coma: Secondary | ICD-10-CM

## 2016-08-29 DIAGNOSIS — R938 Abnormal findings on diagnostic imaging of other specified body structures: Secondary | ICD-10-CM | POA: Diagnosis not present

## 2016-08-29 DIAGNOSIS — J850 Gangrene and necrosis of lung: Secondary | ICD-10-CM | POA: Diagnosis present

## 2016-08-29 DIAGNOSIS — E119 Type 2 diabetes mellitus without complications: Secondary | ICD-10-CM | POA: Diagnosis present

## 2016-08-29 DIAGNOSIS — J9601 Acute respiratory failure with hypoxia: Secondary | ICD-10-CM

## 2016-08-29 DIAGNOSIS — E785 Hyperlipidemia, unspecified: Secondary | ICD-10-CM | POA: Diagnosis present

## 2016-08-29 DIAGNOSIS — Z888 Allergy status to other drugs, medicaments and biological substances status: Secondary | ICD-10-CM | POA: Diagnosis not present

## 2016-08-29 DIAGNOSIS — I2510 Atherosclerotic heart disease of native coronary artery without angina pectoris: Secondary | ICD-10-CM | POA: Diagnosis present

## 2016-08-29 DIAGNOSIS — Z950 Presence of cardiac pacemaker: Secondary | ICD-10-CM

## 2016-08-29 DIAGNOSIS — I110 Hypertensive heart disease with heart failure: Secondary | ICD-10-CM | POA: Diagnosis present

## 2016-08-29 DIAGNOSIS — I442 Atrioventricular block, complete: Secondary | ICD-10-CM | POA: Diagnosis present

## 2016-08-29 DIAGNOSIS — I480 Paroxysmal atrial fibrillation: Secondary | ICD-10-CM | POA: Diagnosis present

## 2016-08-29 DIAGNOSIS — I4821 Permanent atrial fibrillation: Secondary | ICD-10-CM | POA: Diagnosis present

## 2016-08-29 DIAGNOSIS — Z87891 Personal history of nicotine dependence: Secondary | ICD-10-CM | POA: Diagnosis not present

## 2016-08-29 DIAGNOSIS — N400 Enlarged prostate without lower urinary tract symptoms: Secondary | ICD-10-CM | POA: Diagnosis present

## 2016-08-29 DIAGNOSIS — H9190 Unspecified hearing loss, unspecified ear: Secondary | ICD-10-CM | POA: Diagnosis present

## 2016-08-29 DIAGNOSIS — I5043 Acute on chronic combined systolic (congestive) and diastolic (congestive) heart failure: Secondary | ICD-10-CM | POA: Diagnosis present

## 2016-08-29 DIAGNOSIS — J189 Pneumonia, unspecified organism: Secondary | ICD-10-CM

## 2016-08-29 DIAGNOSIS — R5381 Other malaise: Secondary | ICD-10-CM | POA: Diagnosis not present

## 2016-08-29 DIAGNOSIS — Z7901 Long term (current) use of anticoagulants: Secondary | ICD-10-CM

## 2016-08-29 DIAGNOSIS — R9389 Abnormal findings on diagnostic imaging of other specified body structures: Secondary | ICD-10-CM | POA: Diagnosis present

## 2016-08-29 DIAGNOSIS — R0609 Other forms of dyspnea: Secondary | ICD-10-CM

## 2016-08-29 DIAGNOSIS — R0602 Shortness of breath: Secondary | ICD-10-CM | POA: Diagnosis present

## 2016-08-29 DIAGNOSIS — I351 Nonrheumatic aortic (valve) insufficiency: Secondary | ICD-10-CM | POA: Diagnosis not present

## 2016-08-29 LAB — BRAIN NATRIURETIC PEPTIDE: B Natriuretic Peptide: 93 pg/mL (ref 0.0–100.0)

## 2016-08-29 LAB — COMPREHENSIVE METABOLIC PANEL
ALBUMIN: 2.9 g/dL — AB (ref 3.5–5.0)
ALT: 26 U/L (ref 17–63)
AST: 27 U/L (ref 15–41)
Alkaline Phosphatase: 83 U/L (ref 38–126)
Anion gap: 11 (ref 5–15)
BUN: 14 mg/dL (ref 6–20)
CHLORIDE: 105 mmol/L (ref 101–111)
CO2: 27 mmol/L (ref 22–32)
Calcium: 9.2 mg/dL (ref 8.9–10.3)
Creatinine, Ser: 1.11 mg/dL (ref 0.61–1.24)
GFR calc Af Amer: >60 mL/min (ref >60)
GFR calc non Af Amer: 57 mL/min — ABNORMAL LOW (ref >60)
GLUCOSE: 153 mg/dL — AB (ref 65–99)
POTASSIUM: 3.9 mmol/L (ref 3.5–5.1)
Sodium: 143 mmol/L (ref 135–145)
Total Bilirubin: 0.6 mg/dL (ref 0.3–1.2)
Total Protein: 6.9 g/dL (ref 6.5–8.1)

## 2016-08-29 LAB — CBC WITH DIFFERENTIAL/PLATELET
BASOS ABS: 0.1 10*3/uL (ref 0.0–0.1)
BASOS PCT: 0 %
Eosinophils Absolute: 0.6 10*3/uL (ref 0.0–0.7)
Eosinophils Relative: 3 %
HCT: 42.3 % (ref 39.0–52.0)
Hemoglobin: 14 g/dL (ref 13.0–17.0)
Lymphocytes Relative: 16 %
Lymphs Abs: 2.9 10*3/uL (ref 0.7–4.0)
MCH: 31.3 pg (ref 26.0–34.0)
MCHC: 33.1 g/dL (ref 30.0–36.0)
MCV: 94.6 fL (ref 78.0–100.0)
MONO ABS: 2.9 10*3/uL — AB (ref 0.1–1.0)
Monocytes Relative: 16 %
NEUTROS ABS: 11.8 10*3/uL — AB (ref 1.7–7.7)
Neutrophils Relative %: 65 %
PLATELETS: 338 10*3/uL (ref 150–400)
RBC: 4.47 MIL/uL (ref 4.22–5.81)
RDW: 13 % (ref 11.5–15.5)
WBC: 18.2 10*3/uL — AB (ref 4.0–10.5)

## 2016-08-29 LAB — I-STAT TROPONIN, ED: Troponin i, poc: 0.01 ng/mL (ref 0.00–0.08)

## 2016-08-29 LAB — GLUCOSE, CAPILLARY: GLUCOSE-CAPILLARY: 188 mg/dL — AB (ref 65–99)

## 2016-08-29 LAB — I-STAT CG4 LACTIC ACID, ED: LACTIC ACID, VENOUS: 1.62 mmol/L (ref 0.5–1.9)

## 2016-08-29 MED ORDER — SODIUM CHLORIDE 0.9% FLUSH: 3.0000 mL | INTRAVENOUS | Status: DC | PRN

## 2016-08-29 MED ORDER — APIXABAN 5 MG PO TABS
5.0000 mg | ORAL_TABLET | Freq: Two times a day (BID) | ORAL | Status: DC
  Administered 2016-08-29 – 2016-09-02 (×8): 5 mg via ORAL
  Filled 2016-08-29 (×8): qty 1

## 2016-08-29 MED ORDER — POLYETHYLENE GLYCOL 3350 17 G PO PACK: 17.0000 g | PACK | Freq: Every day | ORAL | Status: DC | PRN

## 2016-08-29 MED ORDER — ACETAMINOPHEN 650 MG RE SUPP: 650.0000 mg | Freq: Four times a day (QID) | RECTAL | Status: DC | PRN

## 2016-08-29 MED ORDER — FLUTTER DEVI: 0 refills | Status: DC

## 2016-08-29 MED ORDER — DEXTROSE 5 % IV SOLN
500.0000 mg | Freq: Once | INTRAVENOUS | Status: AC
  Administered 2016-08-29: 500 mg via INTRAVENOUS
  Filled 2016-08-29: qty 500

## 2016-08-29 MED ORDER — GLIPIZIDE 5 MG PO TABS
5.0000 mg | ORAL_TABLET | Freq: Every day | ORAL | Status: DC
  Administered 2016-08-30 – 2016-09-02 (×4): 5 mg via ORAL
  Filled 2016-08-29 (×4): qty 1

## 2016-08-29 MED ORDER — ONDANSETRON HCL 4 MG/2ML IJ SOLN: 4.0000 mg | Freq: Four times a day (QID) | INTRAMUSCULAR | Status: DC | PRN

## 2016-08-29 MED ORDER — LATANOPROST 0.005 % OP SOLN
1.0000 [drp] | Freq: Every day | OPHTHALMIC | Status: DC
  Administered 2016-08-29 – 2016-09-01 (×4): 1 [drp] via OPHTHALMIC
  Filled 2016-08-29: qty 2.5

## 2016-08-29 MED ORDER — FUROSEMIDE 20 MG PO TABS
20.0000 mg | ORAL_TABLET | Freq: Every day | ORAL | Status: DC
  Administered 2016-08-30: 20 mg via ORAL
  Filled 2016-08-29: qty 1

## 2016-08-29 MED ORDER — LISINOPRIL 5 MG PO TABS
5.0000 mg | ORAL_TABLET | Freq: Every day | ORAL | Status: DC
  Administered 2016-08-30 – 2016-09-02 (×4): 5 mg via ORAL
  Filled 2016-08-29 (×4): qty 1

## 2016-08-29 MED ORDER — ACETAMINOPHEN 325 MG PO TABS: 650.0000 mg | ORAL_TABLET | Freq: Four times a day (QID) | ORAL | Status: DC | PRN

## 2016-08-29 MED ORDER — ONDANSETRON HCL 4 MG PO TABS: 4.0000 mg | ORAL_TABLET | Freq: Four times a day (QID) | ORAL | Status: DC | PRN

## 2016-08-29 MED ORDER — SODIUM CHLORIDE 0.9% FLUSH
3.0000 mL | Freq: Two times a day (BID) | INTRAVENOUS | Status: DC
Start: 2016-08-29 — End: 2016-08-31
  Administered 2016-08-30 (×3): 3 mL via INTRAVENOUS

## 2016-08-29 MED ORDER — DEXTROSE 5 % IV SOLN
1.0000 g | Freq: Once | INTRAVENOUS | Status: AC
  Administered 2016-08-29: 1 g via INTRAVENOUS
  Filled 2016-08-29: qty 10

## 2016-08-29 MED ORDER — TERAZOSIN HCL 5 MG PO CAPS
5.0000 mg | ORAL_CAPSULE | Freq: Every day | ORAL | Status: DC
  Administered 2016-08-29 – 2016-09-01 (×4): 5 mg via ORAL
  Filled 2016-08-29 (×5): qty 1

## 2016-08-29 MED ORDER — SODIUM CHLORIDE 0.9% FLUSH
3.0000 mL | Freq: Two times a day (BID) | INTRAVENOUS | Status: DC
  Administered 2016-08-30 – 2016-09-01 (×5): 3 mL via INTRAVENOUS

## 2016-08-29 MED ORDER — SIMVASTATIN 40 MG PO TABS
40.0000 mg | ORAL_TABLET | Freq: Every day | ORAL | Status: DC
  Administered 2016-08-29 – 2016-09-01 (×4): 40 mg via ORAL
  Filled 2016-08-29 (×4): qty 1

## 2016-08-29 MED ORDER — SODIUM CHLORIDE 0.9 % IV SOLN
250.0000 mL | INTRAVENOUS | Status: DC | PRN
Start: 2016-08-29 — End: 2016-08-31

## 2016-08-29 MED ORDER — VITAMIN D 1000 UNITS PO TABS
2000.0000 [IU] | ORAL_TABLET | Freq: Every day | ORAL | Status: DC
  Administered 2016-08-30 – 2016-09-02 (×4): 2000 [IU] via ORAL
  Filled 2016-08-29 (×4): qty 2

## 2016-08-29 MED ORDER — FLUTICASONE PROPIONATE 50 MCG/ACT NA SUSP
2.0000 | Freq: Every day | NASAL | Status: DC | PRN
  Filled 2016-08-29: qty 16

## 2016-08-29 NOTE — ED Notes (Signed)
Bed: IY64 Expected date:  Expected time:  Means of arrival:  Comments: Low O2 from [place]

## 2016-08-29 NOTE — Patient Instructions (Signed)
Go to [facility] ER and have your son return the 02 tank to [place]

## 2016-08-29 NOTE — ED Triage Notes (Signed)
Patient here with complaints of low oxygen level while at pulmonologist visit for follow up. Increased SOB x3 weeks. Denies chest pain.

## 2016-08-29 NOTE — ED Notes (Signed)
Pt is alert and oriented  4 and is verbally responsive. Pt spouse and son are present. Pt denies pain at this time. Pt O2 saturation is 89-91% on  Lpm of oxygen via  pt was increased to 4 LPM , oxygen level increased up yo 94%.

## 2016-08-29 NOTE — Progress Notes (Signed)
Subjective:    Patient ID: Randall Odom, male    DOB: 08-04-1926    MRN: [id number]    Brief patient profile:  89 yowm quit in 1970 at baseline able to walk a mile s stopping x nl pace s stopping then indolent onset starting March 2015 noted worse sob  to point where sob x across the house and barely makes it back up from [place] suggesting R superior segment mass referred to pulmonary clinic 08/13/2013 by Dr Tamala Julian   History of Present Illness  08/13/2013 1st [facility] office visit/ Randall Odom  Chief Complaint  Patient presents with  . Pulmonary Consult    Referred per Dr Daneen Schick for eval of abnormal ct chest. Pt c/o SOB and cough for the past several months. Cough is prod with large amounts of white sputum.  He states he gets SOB just walking from room to room.   started having cough after eating and when get up in am, some yellow  Can't sleep on back x years, ok on side rec Augmentin 875 mg take one pill twice daily  X 14 days - take at breakfast and supper with large glass of water.  It would help reduce the usual side effects (diarrhea and yeast infections) if you ate cultured yogurt at lunch.    08/27/2013 f/u ov/Randall Odom re:  Chief Complaint  Patient presents with  . Follow-up    Pt states that breahting has been doing well since last OV. CXR today.    Not limited by breathing from desired activities-  Including now walking back from mailbox ok  rec If the cough starts to bother you,  We may need you to try a different blood pressure pill  - CT chest 09/21/2013  Persistent irregular cavitary lesion in the right lower lobe. This is better evaluated on the current examination due to the lack of artifact in this area, and its appearance is concerning for cavitary neoplasm. > rec PET 09/30/13 low uptake c/w scar which fits clinically  - Repeat CT chest 03/25/2014 > no change> 03/27/2015 no change > recheck in one year placed in tickle file for 03/26/16 > did not show for f/u CT        08/29/2016  f/u ov/Randall Odom re:  New sob  Chief Complaint  Patient presents with  . Pulmonary Consult    Pt last seen in 2015.  He is here to f/u on lung nodule. He reports increased DOE "for quite a while" can no longer walk to his mailbox. He also c/o cough- esp worse when he lies down- prod with clear sputum.    baseline = very sedentary / goes out for brfast 4 x weekly, gets off at curb due to breathing but still able to do  mb and back until about a month prior to [place] with increased cough rx mucinex clear mucus but no  Fever or purulent sputum - sleeps now at > 30 degrees where was able to lie almost flat  No obvious day to day or daytime variability or assoc  mucus plugs or hemoptysis or cp or chest tightness, subjective wheeze or overt sinus or hb symptoms. No unusual exp hx or h/o childhood pna/ asthma or knowledge of premature birth.    Also denies any obvious fluctuation of symptoms with weather or environmental changes or other aggravating or alleviating factors except as outlined above   Current Medications, Allergies, Complete Past Medical History, Past Surgical History,  Family History, and Social History were reviewed in [employer] record.  ROS  The following are not active complaints unless bolded sore throat, dysphagia, dental problems, itching, sneezing,  nasal congestion or excess/ purulent secretions, ear ache,   fever, chills, sweats, unintended wt loss, classically pleuritic or exertional cp,  orthopnea pnd or leg swelling, presyncope, palpitations, abdominal pain, anorexia, nausea, vomiting, diarrhea  or change in bowel or bladder habits, change in stools or urine, dysuria,hematuria,  rash, arthralgias, visual complaints, headache, numbness, weakness or ataxia or problems with walking or coordination,  change in mood/affect or memory.                        Objective:   Physical Exam  amb obese chronically ill wm with sats low 80's  RA   08/29/2016        206  08/27/13            210  Wt Readings from Last 3 Encounters:  08/13/13 205 lb (92.987 kg)  08/12/13 204 lb (92.534 kg)  08/09/13 206 lb (93.441 kg)         HEENT: nl  turbinates, and oropharynx. Nl external ear canals without cough reflex - edentulous    NECK :  without JVD/Nodes/TM/ nl carotid upstrokes bilaterally   LUNGS: no acc muscle use, very coarse insp and exp rhonchi bilaterally with a few insp pops /crackles as well R >L    CV:  RRR  no s3 or murmur or increase in P2, no edema   ABD:  soft and nontender with nl excursion in the supine position. No bruits or organomegaly, bowel sounds nl  MS:  warm without deformities, calf tenderness, cyanosis or clubbing  SKIN: warm and dry without lesions    NEURO:  alert, approp, no deficits      CXR PA and Lateral:   08/29/2016 :    I personally reviewed images and agree with radiology impression as follows:    Interval development of bilateral airspace filling opacities, likely infectious in origin. Asymmetric pulmonary edema could have a similar appearance.   Labs ordered/ reviewed:      Chemistry      Component Value Date/Time   NA 143 08/29/2016 1737   K 3.9 08/29/2016 1737   CL 105 08/29/2016 1737   CO2 27 08/29/2016 1737   BUN 14 08/29/2016 1737   CREATININE 1.11 08/29/2016 1737      Component Value Date/Time   CALCIUM 9.2 08/29/2016 1737   ALKPHOS 83 08/29/2016 1737   AST 27 08/29/2016 1737   ALT 26 08/29/2016 1737   BILITOT 0.6 08/29/2016 1737        Lab Results  Component Value Date   WBC 18.2 (H) 08/29/2016   HGB 14.0 08/29/2016   HCT 42.3 08/29/2016   MCV 94.6 08/29/2016   PLT 338 08/29/2016       Eos                        0.6                                                                    08/29/2016  BNP                            93                                                                     08/29/2016          Assessment & Plan:

## 2016-08-29 NOTE — H&P (Signed)
[facility] History and Physical  Randall Odom NFA:[id number] DOB: 05-31-1926 DOA: 08/29/2016  Referring physician: Dr Billy Fischer, [facility] PCP: Prince Solian, MD   Chief Complaint: Low O2 levels at pulmonogist's office today, ^SOB x 3 wks  HPI: Randall Odom is a 83 y.o. male w/ history of DM2, PPM, HTN, BPH, glaucoma, DJD, depression, CHB sp PPM, chronic combined CHF, CAD and atrial fibrillation.  He is HOH and his wife provides all the history.  Here for progressive DOE x > weeks.  Has cough w/ slight whitish sputum, no CP, no hemoptysis, no fevers or chills.  No wt loss but poor appetite.  No purulent sputum.  No hx PNA. Does have hist of DOE seen in 2015 in pulm clinic,  CT showed question cavitary lesion RLL, got course of po abx and was supposed to have f/u but he missed the appt according to the wife because of a fall.    Also has hx of DM2 on po agents, HTN, afib on Eliquis, CHB sp PPM f/b cardiology.  Had Cdif colitis in 2014.    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  Retired and lives with his wife.  No tob/ etoh history.      Chart Feb '13 - fall, gen'd weakness, syncope and collapse. DM, PPM, HTN, CAD.  Suspected orthostat hypotension due to DM and autonomic dysfunction.  hytrin and lisiniprol stopped and bp's improved. Add midodrine in future if recurs.  Carotid art stenosis , on asa dn statin, f/b VVS.  DM on po agents. PPM.  HTN. CAD stable.  Mar '14 - diarrhea x 5 days , dull abd pain, gen weak > +Cdif colitis rx with IV flagyl then po vanc, improved. Avoid unnecessary abx and PPI's.  PPI was stopped. DM2. Went to [employer] [facility].    ROS  denies CP  no joint pain   no HA  no blurry vision  no rash  no diarrhea  no nausea/ vomiting  no dysuria  no difficulty voiding  no change in urine color    Past Medical History  Past Medical History:  Diagnosis Date  . Allergic rhinitis   . Anxiety   . Atrial fibrillation with controlled  ventricular response ([place])   . CAD (coronary artery disease)   . Carpal tunnel syndrome   . Chronic combined systolic and diastolic CHF (congestive heart failure) ([place])   . Complete heart block ([place])   . Depressive disorder, not elsewhere classified   . Diabetes mellitus   . Diverticulosis of colon (without mention of hemorrhage)   . DJD (degenerative joint disease)   . Enlarged prostate   . Erectile dysfunction   . Glaucoma    History of glaucoma  . History of BPH   . Hypertension   . Pure hypercholesterolemia   . Recurrent falls   . Sacroiliitis, not elsewhere classified ([place])   . Thoracic or lumbosacral neuritis or radiculitis, unspecified    Past Surgical History  Past Surgical History:  Procedure Laterality Date  . APPENDECTOMY  1966  . EP IMPLANTABLE DEVICE N/A 11/16/2015   PPM generator change by Dr Rayann Heman,  SJM Assurity SR PPM for complete heart block  . PACEMAKER INSERTION  09/28/1997   DDD for high-grade AV Block. Had an upgrade to a dual chamber device on 09/09/97. Replaced dual chamber battery on 12/06/04. New device is a Financial risk analyst Identity ADX XL DR, model [id number], seriel [id number].  Marland Kitchen  TONSILLECTOMY  1953   Family History  Family History  Problem Relation Age of Onset  . Cancer Mother        breast  . CVA Father   . Anemia Brother   . Heart disease Brother    Social History  reports that he quit smoking about 48 years ago. His smoking use included Cigarettes. He has a 37.50 pack-year smoking history. He has never used smokeless tobacco. He reports that he does not drink alcohol or use drugs. Allergies  Allergies  Allergen Reactions  . Spironolactone Diarrhea and Nausea And Vomiting   Home medications Prior to Admission medications   Medication Sig Start Date End Date Taking? Authorizing Provider  apixaban (ELIQUIS) 5 MG TABS tablet TAKE 1 TABLET (5 MG TOTAL) BY MOUTH 2 (TWO) TIMES DAILY. Patient taking differently: Take 5 mg by mouth 2 (two) times daily.  11/18/15   Yes Allred, Jeneen Rinks, MD  Cholecalciferol (VITAMIN D) 2000 UNITS tablet Take 2,000 Units by mouth daily.   Yes [provider]  fluticasone (FLONASE) 50 MCG/ACT nasal spray Place 2 sprays into the nose daily as needed for allergies. Patient taking differently: Place 2 sprays into both nostrils daily as needed for allergies.  05/27/12  Yes Angiulli, Lavon Paganini, PA-C  furosemide (LASIX) 40 MG tablet TAKE 0.5 TABLETS (20 MG TOTAL) BY MOUTH DAILY. 10/06/15  Yes Belva Crome, MD  glipiZIDE (GLUCOTROL) 5 MG tablet Take 5 mg by mouth daily before breakfast.   Yes [provider]  lisinopril (PRINIVIL,ZESTRIL) 5 MG tablet Take 5 mg by mouth daily.   Yes [provider]  Multiple Vitamin (MULTIVITAMIN WITH MINERALS) TABS Take 1 tablet by mouth daily. 05/27/12  Yes Angiulli, Lavon Paganini, PA-C  simvastatin (ZOCOR) 40 MG tablet Take 1 tablet (40 mg total) by mouth at bedtime. 05/27/12  Yes Angiulli, Lavon Paganini, PA-C  terazosin (HYTRIN) 5 MG capsule Take 1 capsule (5 mg total) by mouth at bedtime. 05/27/12  Yes Angiulli, Lavon Paganini, PA-C  TRAVATAN Z 0.004 % SOLN ophthalmic solution Place 1 drop into both eyes at bedtime.   Yes [provider]   Liver Function Tests  Recent Labs Lab 08/29/16 1737  AST 27  ALT 26  ALKPHOS 83  BILITOT 0.6  PROT 6.9  ALBUMIN 2.9*   No results for input(s): LIPASE, AMYLASE in the last 168 hours. CBC  Recent Labs Lab 08/29/16 1737  WBC 18.2*  NEUTROABS 11.8*  HGB 14.0  HCT 42.3  MCV 94.6  PLT 235   Basic Metabolic Panel  Recent Labs Lab 08/29/16 1737  NA 143  K 3.9  CL 105  CO2 27  GLUCOSE 153*  BUN 14  CREATININE 1.11  CALCIUM 9.2     Vitals:   08/29/16 1850 08/29/16 1900 08/29/16 1919 08/29/16 1921  BP: (!) 148/68 128/65    Pulse: 74   74  Resp: 18 (!) 30    Temp:    97.6 F (36.4 C)  TempSrc:    Oral  SpO2: 95%  93%    Exam: Gen alert, no distres, elderly WM No rash, cyanosis or gangrene Sclera anicteric, throat  clear  No jvd or bruits Chest velcro rales 1/2 up on R, and also a L base, occ exp pops and short wheezing No ^WOB RRR no MRG Abd soft ntnd no mass or ascites +bs obese, no hsm GU normal male MS no joint effusions or deformity Ext normal w/o LE or UE edema / no  wounds or ulcers Neuro is alert, Ox 3 , nf   Na 143  K 3.9  BUN 14  Cr 1.11  Glu 153  Alb 2.9  LFT's ok  Ca 9.2 WBC 18k   Hb 14  plt 338   Glu 153  EKG (independ reviewed) > Accelerated junctional rhythm. Nonspecific IVCD with LAD. Anterolateral infarct, age indeterminate CXR (independ reviewed) > "Interval development of bilateral airspace filling opacities, likely infectious in origin. Asymmetric pulmonary edema could have a similar appearance." CT chest - significant fibrosing type infiltrates through much of R lung and also milder at L lung base, this is reading of undersigned , official reading is pending   Assess: 1  Dyspnea w/ hypoxemia and acute vs chron resp failure - has diffuse R infiltrates which look fibrosing.  Not toxic or febrile, has [facility] but will hold off on abx and ask pulm to see in am.  May have pulm fibrosis or some type of inflammatory lung disease. Had eval in 2015 by pulm but missed f/u.  Nasal O2, tele admission.  2  DM2 cont po agents 3  HTN cont acei/ hytrin 4  CHB sp PPM  5  AFib cont Eliquis 6  Hx CM last EF 45-50% in 2016, on lasix , well compensated  Plan - as above     [place] D [facility] Pager [phone]   If 7PM-7AM, please contact night-coverage [email or web address] Password [facility] 08/29/2016, 8:27 PM

## 2016-08-29 NOTE — ED Provider Notes (Signed)
[facility] DEPT Provider Note   CSN: [id number] Arrival date & time: 08/29/16  1710     History   Chief Complaint Chief Complaint  Patient presents with  . Shortness of Breath    HPI Randall Odom is a 83 y.o. male.  HPI   Presents with concern for hypoxia from pulmonology office. Patient reports that he's had cough and shortness of breath for the last month. Reports that shortness of breath has been worsening over the last 3 weeks. Reports he's had associated cough that has also been worsening. Reports it's productive of clear sputum. Reports he's had some nasal congestion as well. Reports he's noticed wheezing with cough at times. Denies fever, chest pain, leg swelling. No recent medication changes.  Past Medical History:  Diagnosis Date  . Allergic rhinitis   . Anxiety   . Atrial fibrillation with controlled ventricular response ([place])   . CAD (coronary artery disease)   . Carpal tunnel syndrome   . Chronic combined systolic and diastolic CHF (congestive heart failure) ([place])   . Complete heart block ([place])   . Depressive disorder, not elsewhere classified   . Diabetes mellitus   . Diverticulosis of colon (without mention of hemorrhage)   . DJD (degenerative joint disease)   . Enlarged prostate   . Erectile dysfunction   . Glaucoma    History of glaucoma  . History of BPH   . Hypertension   . Pure hypercholesterolemia   . Recurrent falls   . Sacroiliitis, not elsewhere classified ([place])   . Thoracic or lumbosacral neuritis or radiculitis, unspecified     Patient Active Problem List   Diagnosis Date Noted  . Acute respiratory failure with hypoxemia ([place]) 08/29/2016  . Abnormal CT of the chest 08/29/2016  . Acute and chronic respiratory failure with hypoxia ([place]) 08/29/2016  . Chronic anticoagulation 07/10/2016  . Abnormal CT scan, chest 08/13/2013  . Hyperlipidemia 08/09/2013  . Essential hypertension 08/09/2013  . Dyspnea 08/09/2013  . Chronic combined  systolic and diastolic heart failure ([place]) 08/09/2013  . Permanent atrial fibrillation ([place]) 02/22/2013  . Complete heart block ([place]) 02/22/2013  . Occlusion and stenosis of carotid artery without mention of cerebral infarction 06/02/2012  . Physical deconditioning 05/18/2012  . C. difficile colitis 05/13/2012  . Dehydration 05/13/2012  . Diarrhea 05/12/2012  . Acute renal failure ([place]) 05/12/2012  . Hypokalemia 05/12/2012  . Syncope and collapse 05/06/2011  . Diabetes mellitus ([place]) 05/06/2011  . Pacemaker 05/06/2011  . CAD (coronary artery disease) 05/06/2011    Past Surgical History:  Procedure Laterality Date  . APPENDECTOMY  1966  . EP IMPLANTABLE DEVICE N/A 11/16/2015   PPM generator change by Dr Rayann Heman,  SJM Assurity SR PPM for complete heart block  . PACEMAKER INSERTION  09/28/1997   DDD for high-grade AV Block. Had an upgrade to a dual chamber device on 09/09/97. Replaced dual chamber battery on 12/06/04. New device is a Financial risk analyst Identity ADX XL DR, model [id number], seriel [id number].  . TONSILLECTOMY  1953       Home Medications    Prior to Admission medications   Medication Sig Start Date End Date Taking? Authorizing Provider  apixaban (ELIQUIS) 5 MG TABS tablet TAKE 1 TABLET (5 MG TOTAL) BY MOUTH 2 (TWO) TIMES DAILY. Patient taking differently: Take 5 mg by mouth 2 (two) times daily.  11/18/15  Yes Allred, Jeneen Rinks, MD  Cholecalciferol (VITAMIN D) 2000 UNITS tablet Take 2,000 Units by mouth daily.   Yes [provider]  fluticasone (FLONASE) 50 MCG/ACT nasal spray Place 2 sprays into the nose daily as needed for allergies. Patient taking differently: Place 2 sprays into both nostrils daily as needed for allergies.  05/27/12  Yes Angiulli, Lavon Paganini, PA-C  furosemide (LASIX) 40 MG tablet TAKE 0.5 TABLETS (20 MG TOTAL) BY MOUTH DAILY. 10/06/15  Yes Belva Crome, MD  glipiZIDE (GLUCOTROL) 5 MG tablet Take 5 mg by mouth daily before breakfast.   Yes [provider]    lisinopril (PRINIVIL,ZESTRIL) 5 MG tablet Take 5 mg by mouth daily.   Yes [provider]  Multiple Vitamin (MULTIVITAMIN WITH MINERALS) TABS Take 1 tablet by mouth daily. 05/27/12  Yes Angiulli, Lavon Paganini, PA-C  simvastatin (ZOCOR) 40 MG tablet Take 1 tablet (40 mg total) by mouth at bedtime. 05/27/12  Yes Angiulli, Lavon Paganini, PA-C  terazosin (HYTRIN) 5 MG capsule Take 1 capsule (5 mg total) by mouth at bedtime. 05/27/12  Yes Angiulli, Lavon Paganini, PA-C  TRAVATAN Z 0.004 % SOLN ophthalmic solution Place 1 drop into both eyes at bedtime.   Yes [provider]    Family History Family History  Problem Relation Age of Onset  . Cancer Mother        breast  . CVA Father   . Anemia Brother   . Heart disease Brother     Social History Social History  Substance Use Topics  . Smoking status: Former Smoker    Packs/day: 1.50    Years: 25.00    Types: Cigarettes    Quit date: 03/11/1968  . Smokeless tobacco: Never Used  . Alcohol use No     Allergies   Spironolactone   Review of Systems Review of Systems  Constitutional: Negative for fever.  HENT: Positive for rhinorrhea. Negative for sore throat.   Eyes: Negative for visual disturbance.  Respiratory: Positive for cough, shortness of breath and wheezing.   Cardiovascular: Negative for chest pain and leg swelling.  Gastrointestinal: Negative for abdominal pain, diarrhea, nausea and vomiting.  Genitourinary: Negative for difficulty urinating.  Musculoskeletal: Negative for back pain and neck stiffness.  Skin: Negative for rash.  Neurological: Negative for syncope and headaches.     Physical Exam Updated Vital Signs BP (!) 121/54 (BP Location: Left Arm)   Pulse 74   Temp 97.8 F (36.6 C) (Oral)   Resp 20   SpO2 93%   Physical Exam  Constitutional: He is oriented to person, place, and time. He appears well-developed and well-nourished. No distress.  HENT:  Head: Normocephalic and atraumatic.  Eyes:  Conjunctivae and EOM are normal.  Neck: Normal range of motion.  Cardiovascular: Normal rate, regular rhythm, normal heart sounds and intact distal pulses.  Exam reveals no gallop and no friction rub.   No murmur heard. Pulmonary/Chest: Effort normal. No respiratory distress. He has wheezes (with cough). He has rales (RLL).  Abdominal: Soft. He exhibits no distension. There is no tenderness. There is no guarding.  Musculoskeletal: He exhibits no edema.  Neurological: He is alert and oriented to person, place, and time.  Skin: Skin is warm and dry. He is not diaphoretic.  Nursing note and vitals reviewed.    ED Treatments / Results  Labs (all labs ordered are listed, but only abnormal results are displayed) Labs Reviewed  CBC WITH DIFFERENTIAL/PLATELET - Abnormal; Notable for the following:       Result Value   WBC 18.2 (*)    Neutro Abs 11.8 (*)  Monocytes Absolute 2.9 (*)    All other components within normal limits  COMPREHENSIVE METABOLIC PANEL - Abnormal; Notable for the following:    Glucose, Bld 153 (*)    Albumin 2.9 (*)    GFR calc non Af Amer 57 (*)    All other components within normal limits  GLUCOSE, CAPILLARY - Abnormal; Notable for the following:    Glucose-Capillary 188 (*)    All other components within normal limits  CULTURE, BLOOD (ROUTINE X 2)  CULTURE, BLOOD (ROUTINE X 2)  BRAIN NATRIURETIC PEPTIDE  URINALYSIS, ROUTINE W REFLEX MICROSCOPIC  BASIC METABOLIC PANEL  CBC  I-STAT TROPOININ, ED  I-STAT CG4 LACTIC ACID, ED    EKG  EKG Interpretation None       Radiology Dg Chest 2 View  Result Date: 08/29/2016 CLINICAL DATA:  Patient here with complaints of low oxygen level while at pulmonologist visit for follow up. Increased SOB x3 weeks. Denies chest pain. Hx chf, a-fib, htn, ex-smoker. EXAM: CHEST  2 VIEW COMPARISON:  07/11/2014 FINDINGS: Patient has right-sided transvenous pacemaker with leads to the right atrium and right ventricle. Shallow lung  inflation. Heart is mildly enlarged. There are patchy infiltrates involving the right lung and the left lower lobe, favoring infectious process. IMPRESSION: Interval development of bilateral airspace filling opacities, likely infectious in origin. Asymmetric pulmonary edema could have a similar appearance. Electronically Signed   By: Nolon Nations M.D.   On: 08/29/2016 18:20   Ct Chest Wo Contrast  Result Date: 08/29/2016 CLINICAL DATA:  Cough and dyspnea EXAM: CT CHEST WITHOUT CONTRAST TECHNIQUE: Multidetector CT imaging of the chest was performed following the standard protocol without IV contrast. COMPARISON:  Chest CT 03/27/2015 FINDINGS: Cardiovascular: The heart is enlarged. There pacemaker leads in the right atrium, right ventricle and coronary sinus. No pericardial effusion. There are coronary artery atherosclerotic calcifications. There is extensive atherosclerotic calcification of the aorta. Main pulmonary artery is mildly enlarged. Mediastinum/Nodes: 12 mm level 4R lymph node. Other mediastinal nodes measure up to 13 mm. No axillary adenopathy. Normal thyroid. Lungs/Pleura: There is extensive consolidation throughout the right lung. There is a medium-sized right pleural effusion. There are locules of air within the effusion. There are additional scattered areas of consolidation in the left lung. Upper Abdomen: There is a 5 mm calculus at the right renal hilum. The partially visualized left renal pelvis is dilated. Musculoskeletal: No chest wall mass or suspicious bone lesions identified. IMPRESSION: 1. Multiple large areas of consolidation within both lungs, worse on the right. Differential diagnosis is broad, including multifocal pneumonia (favored), granulomatous disease such as sarcoidosis or inhalation/hypersensitivity disease such as pneumoconiosis. Correlation with laboratory values and bronchoscopy/bronchoalveolar lavage may be helpful. 2.  Aortic Atherosclerosis (ICD10-I70.0).  Electronically Signed   By: Ulyses Jarred M.D.   On: 08/29/2016 20:56    Procedures Procedures (including critical care time)  Medications Ordered in ED Medications  latanoprost (XALATAN) 0.005 % ophthalmic solution 1 drop (1 drop Both Eyes Given 08/29/16 2355)  apixaban (ELIQUIS) tablet 5 mg (5 mg Oral Given 08/29/16 2355)  furosemide (LASIX) tablet 20 mg (not administered)  glipiZIDE (GLUCOTROL) tablet 5 mg (not administered)  lisinopril (PRINIVIL,ZESTRIL) tablet 5 mg (not administered)  fluticasone (FLONASE) 50 MCG/ACT nasal spray 2 spray (not administered)  simvastatin (ZOCOR) tablet 40 mg (40 mg Oral Given 08/29/16 2355)  terazosin (HYTRIN) capsule 5 mg (5 mg Oral Given 08/29/16 2355)  cholecalciferol (VITAMIN D) tablet 2,000 Units (not administered)  sodium chloride flush (NS) 0.9 %  injection 3 mL (3 mLs Intravenous Given 08/30/16 0130)  sodium chloride flush (NS) 0.9 % injection 3 mL (3 mLs Intravenous Given 08/30/16 0130)  sodium chloride flush (NS) 0.9 % injection 3 mL (not administered)  0.9 %  sodium chloride infusion (not administered)  acetaminophen (TYLENOL) tablet 650 mg (not administered)    Or  acetaminophen (TYLENOL) suppository 650 mg (not administered)  ondansetron (ZOFRAN) tablet 4 mg (not administered)    Or  ondansetron (ZOFRAN) injection 4 mg (not administered)  polyethylene glycol (MIRALAX / GLYCOLAX) packet 17 g (not administered)  cefTRIAXone (ROCEPHIN) 1 g in dextrose 5 % 50 mL IVPB (0 g Intravenous Stopped 08/29/16 1942)  azithromycin (ZITHROMAX) 500 mg in dextrose 5 % 250 mL IVPB (0 mg Intravenous Stopped 08/29/16 2156)     Initial Impression / Assessment and Plan / ED Course  I have reviewed the triage vital signs and the nursing notes.  Pertinent labs & imaging results that were available during my care of the patient were reviewed by me and considered in my medical decision making (see chart for details).    83 year old male with a history of coronary  artery disease, chronic systolic and diastolic congestive heart failure, pacemaker due to complete heart block, atrial fibrillation on eliquis, DM, htn, hlpd, cavitary lesion in the right lower lobe favoring benign etiology on CT 03/2015, who presents from pulmonary clinic with concern for dyspnea for 3 weeks and hypoxia.  XR concerning for pneumonia versus asymmetric edema. Leukocytosis present. BNP normal. Feel pneumonia more likely.  Ordered blood cx, lactic acid, Rocephin and azithromycin for community acquired pneumonia. Will admit for further care. CT chest ordered for further evaluation.   Final Clinical Impressions(s) / ED Diagnoses   Final diagnoses:  Community acquired pneumonia, unspecified laterality  Acute respiratory failure with hypoxia [facility])    New Prescriptions Current Discharge Medication List       Gareth Morgan, MD 08/30/16 [phone]

## 2016-08-30 ENCOUNTER — Inpatient Hospital Stay (HOSPITAL_COMMUNITY): Payer: Medicare Other

## 2016-08-30 DIAGNOSIS — J9601 Acute respiratory failure with hypoxia: Secondary | ICD-10-CM

## 2016-08-30 DIAGNOSIS — I351 Nonrheumatic aortic (valve) insufficiency: Secondary | ICD-10-CM

## 2016-08-30 DIAGNOSIS — R918 Other nonspecific abnormal finding of lung field: Secondary | ICD-10-CM

## 2016-08-30 LAB — ECHOCARDIOGRAM COMPLETE
Height: 69 in
Weight: 3283.97 oz

## 2016-08-30 LAB — BASIC METABOLIC PANEL
Anion gap: 9 (ref 5–15)
BUN: 18 mg/dL (ref 6–20)
CO2: 26 mmol/L (ref 22–32)
CREATININE: 1.07 mg/dL (ref 0.61–1.24)
Calcium: 8.9 mg/dL (ref 8.9–10.3)
Chloride: 107 mmol/L (ref 101–111)
GFR calc Af Amer: >60 mL/min (ref >60)
GFR, EST NON AFRICAN AMERICAN: 60 mL/min — AB (ref >60)
GLUCOSE: 160 mg/dL — AB (ref 65–99)
Potassium: 3.5 mmol/L (ref 3.5–5.1)
SODIUM: 142 mmol/L (ref 135–145)

## 2016-08-30 LAB — URINALYSIS, ROUTINE W REFLEX MICROSCOPIC
BILIRUBIN URINE: NEGATIVE
GLUCOSE, UA: NEGATIVE mg/dL
Ketones, ur: NEGATIVE mg/dL
Nitrite: NEGATIVE
PROTEIN: NEGATIVE mg/dL
Specific Gravity, Urine: 1.015 (ref 1.005–1.030)
pH: 5 (ref 5.0–8.0)

## 2016-08-30 LAB — CBC
HCT: 38.9 % — ABNORMAL LOW (ref 39.0–52.0)
Hemoglobin: 12.6 g/dL — ABNORMAL LOW (ref 13.0–17.0)
MCH: 30.7 pg (ref 26.0–34.0)
MCHC: 32.4 g/dL (ref 30.0–36.0)
MCV: 94.9 fL (ref 78.0–100.0)
Platelets: 304 10*3/uL (ref 150–400)
RBC: 4.1 MIL/uL — ABNORMAL LOW (ref 4.22–5.81)
RDW: 13.1 % (ref 11.5–15.5)
WBC: 15.1 10*3/uL — AB (ref 4.0–10.5)

## 2016-08-30 LAB — GLUCOSE, CAPILLARY
GLUCOSE-CAPILLARY: 186 mg/dL — AB (ref 65–99)
Glucose-Capillary: 178 mg/dL — ABNORMAL HIGH (ref 65–99)
Glucose-Capillary: 166 mg/dL — ABNORMAL HIGH (ref 65–99)
Glucose-Capillary: 287 mg/dL — ABNORMAL HIGH (ref 65–99)

## 2016-08-30 LAB — C-REACTIVE PROTEIN: CRP: 5.8 mg/dL — ABNORMAL HIGH (ref <1.0)

## 2016-08-30 LAB — PROCALCITONIN: Procalcitonin: 0.17 ng/mL

## 2016-08-30 LAB — SEDIMENTATION RATE: Sed Rate: 46 mm/hr — ABNORMAL HIGH (ref 0–16)

## 2016-08-30 MED ORDER — INSULIN ASPART 100 UNIT/ML ~~LOC~~ SOLN
0.0000 [IU] | Freq: Three times a day (TID) | SUBCUTANEOUS | Status: DC
  Administered 2016-08-30 (×2): 2 [IU] via SUBCUTANEOUS
  Administered 2016-08-31: 5 [IU] via SUBCUTANEOUS
  Administered 2016-08-31: 7 [IU] via SUBCUTANEOUS
  Administered 2016-08-31: 5 [IU] via SUBCUTANEOUS
  Administered 2016-09-01 (×2): 3 [IU] via SUBCUTANEOUS
  Administered 2016-09-01: 5 [IU] via SUBCUTANEOUS
  Administered 2016-09-02: 2 [IU] via SUBCUTANEOUS
  Administered 2016-09-02: 1 [IU] via SUBCUTANEOUS

## 2016-08-30 MED ORDER — DEXTROSE 5 % IV SOLN
1.0000 g | INTRAVENOUS | Status: DC
  Administered 2016-08-30 – 2016-08-31 (×2): 1 g via INTRAVENOUS
  Filled 2016-08-30 (×2): qty 10

## 2016-08-30 MED ORDER — METHYLPREDNISOLONE SODIUM SUCC 40 MG IJ SOLR
40.0000 mg | Freq: Four times a day (QID) | INTRAMUSCULAR | Status: DC
  Administered 2016-08-30 – 2016-08-31 (×3): 40 mg via INTRAVENOUS
  Filled 2016-08-30 (×4): qty 1

## 2016-08-30 MED ORDER — INSULIN ASPART 100 UNIT/ML ~~LOC~~ SOLN
0.0000 [IU] | Freq: Every day | SUBCUTANEOUS | Status: DC
  Administered 2016-08-30: 3 [IU] via SUBCUTANEOUS
  Administered 2016-09-01: 2 [IU] via SUBCUTANEOUS

## 2016-08-30 MED ORDER — DEXTROSE 5 % IV SOLN
500.0000 mg | INTRAVENOUS | Status: DC
  Administered 2016-08-30: 500 mg via INTRAVENOUS
  Filled 2016-08-30 (×2): qty 500

## 2016-08-30 NOTE — Progress Notes (Signed)
  Echocardiogram 2D Echocardiogram has been performed.  Darlina Sicilian M 08/30/2016, 1:03 PM

## 2016-08-30 NOTE — Progress Notes (Addendum)
PCCM Progress Note  Admission date: 08/29/2016 Consult date: 08/29/2016 Referring provider: Dr. Candiss Norse, [facility]   CC: Short of breath  HPI: 83 yo male former smoker with cough, dyspnea, chest congestion and progressive Rt lung infiltrate.  Subjective: Still has cough with clear sputum.  Denies fever, hemoptysis, chest pain, post nasal drip, skin rash, joint swelling.  Winded after walking 100 feet.  Vital signs: BP (!) 120/56 (BP Location: Right Arm)   Pulse 71   Temp 97.9 F (36.6 C) (Oral)   Resp 20   Ht 5\' 9"  (1.753 m)   Wt 205 lb 4 oz (93.1 kg)   SpO2 94%   BMI 30.31 kg/m   Intake/output: I/O last 3 completed shifts: In: 300 [IV Piggyback:300] Out: -   General: pleasant Neuro: normal strength HEENT: no stridor Cardiac: regular, no murmur Chest: bronchial breath sounds Rt upper lung Abd: soft, non tender Ext: no edema Skin: no rashes   CMP Latest Ref Rng & Units 08/30/2016 08/29/2016 11/16/2015  Glucose 65 - 99 mg/dL 160(H) 153(H) 192(H)  BUN 6 - 20 mg/dL 18 14 11   Creatinine 0.61 - 1.24 mg/dL 1.07 1.11 1.11  Sodium 135 - 145 mmol/L 142 143 142  Potassium 3.5 - 5.1 mmol/L 3.5 3.9 3.7  Chloride 101 - 111 mmol/L 107 105 108  CO2 22 - 32 mmol/L 26 27 24   Calcium 8.9 - 10.3 mg/dL 8.9 9.2 9.4  Total Protein 6.5 - 8.1 g/dL - 6.9 -  Total Bilirubin 0.3 - 1.2 mg/dL - 0.6 -  Alkaline Phos 38 - 126 U/L - 83 -  AST 15 - 41 U/L - 27 -  ALT 17 - 63 U/L - 26 -     CBC Latest Ref Rng & Units 08/30/2016 08/29/2016 11/16/2015  WBC 4.0 - 10.5 K/uL 15.1(H) 18.2(H) 11.6(H)  Hemoglobin 13.0 - 17.0 g/dL 12.6(L) 14.0 14.5  Hematocrit 39.0 - 52.0 % 38.9(L) 42.3 44.1  Platelets 150 - 400 K/uL 304 338 162     ABG    Component Value Date/Time   TCO2 27 05/06/2011 1213     CBG (last 3)   Recent Labs  08/29/16 2308 08/30/16 0840 08/30/16 1230  GLUCAP 188* 178* 186*    Lab Results  Component Value Date   ESRSEDRATE 46 (H) 08/30/2016    BNP    Component Value  Date/Time   BNP 93.0 08/29/2016 1737    Imaging: Dg Chest 2 View  Result Date: 08/29/2016 CLINICAL DATA:  Patient here with complaints of low oxygen level while at pulmonologist visit for follow up. Increased SOB x3 weeks. Denies chest pain. Hx chf, a-fib, htn, ex-smoker. EXAM: CHEST  2 VIEW COMPARISON:  07/11/2014 FINDINGS: Patient has right-sided transvenous pacemaker with leads to the right atrium and right ventricle. Shallow lung inflation. Heart is mildly enlarged. There are patchy infiltrates involving the right lung and the left lower lobe, favoring infectious process. IMPRESSION: Interval development of bilateral airspace filling opacities, likely infectious in origin. Asymmetric pulmonary edema could have a similar appearance. Electronically Signed   By: Nolon Nations M.D.   On: 08/29/2016 18:20   Ct Chest Wo Contrast  Result Date: 08/29/2016 CLINICAL DATA:  Cough and dyspnea EXAM: CT CHEST WITHOUT CONTRAST TECHNIQUE: Multidetector CT imaging of the chest was performed following the standard protocol without IV contrast. COMPARISON:  Chest CT 03/27/2015 FINDINGS: Cardiovascular: The heart is enlarged. There pacemaker leads in the right atrium, right ventricle and coronary sinus. No pericardial effusion. There are  coronary artery atherosclerotic calcifications. There is extensive atherosclerotic calcification of the aorta. Main pulmonary artery is mildly enlarged. Mediastinum/Nodes: 12 mm level 4R lymph node. Other mediastinal nodes measure up to 13 mm. No axillary adenopathy. Normal thyroid. Lungs/Pleura: There is extensive consolidation throughout the right lung. There is a medium-sized right pleural effusion. There are locules of air within the effusion. There are additional scattered areas of consolidation in the left lung. Upper Abdomen: There is a 5 mm calculus at the right renal hilum. The partially visualized left renal pelvis is dilated. Musculoskeletal: No chest wall mass or  suspicious bone lesions identified. IMPRESSION: 1. Multiple large areas of consolidation within both lungs, worse on the right. Differential diagnosis is broad, including multifocal pneumonia (favored), granulomatous disease such as sarcoidosis or inhalation/hypersensitivity disease such as pneumoconiosis. Correlation with laboratory values and bronchoscopy/bronchoalveolar lavage may be helpful. 2.  Aortic Atherosclerosis (ICD10-I70.0). Electronically Signed   By: Ulyses Jarred M.D.   On: 08/29/2016 20:56     Studies: CT chest 6/21 >> extensive consolidation Rt lung, small Rt > Lt pleural effusions Echo 6/22 >>   Antibiotics: Rocephin 6/21 >> Zithromax 6/21 >>  Cultures: Blood 6/21 >>  Events: 6/21 Admit   Assessment/plan:  Pulmonary infiltrates with acute hypoxic respiratory failure. - differential is infection versus inflammatory process - low procalcitonin so ?infection - continue Abx - add solumedrol 6/22 - might need bronchoscopy if no improvement - f/u hypersensitivity panel  - f/u CXR 6/23  Updated pt's family at bedside  Time spent 79 minutes  Chesley Mires, MD [place] 08/30/2016, 12:50 PM Pager:  [phone] After 3pm call: [phone]

## 2016-08-30 NOTE — Progress Notes (Signed)
SATURATION QUALIFICATIONS: (This note is used to comply with regulatory documentation for home oxygen)  Patient Saturations on Room Air at Rest = 92%  Patient Saturations on Room Air while Ambulating = 86%  Patient Saturations on 2 Liters of oxygen while Ambulating = 89%  Please briefly explain why patient needs home oxygen: SOB with activity and oxygen saturation dropped.

## 2016-08-30 NOTE — Evaluation (Signed)
Physical Therapy Evaluation Patient Details Name: Randall Odom MRN: [id number] DOB: 02/07/1927 Today's Date: 08/30/2016   History of Present Illness  83 y.o. male w/ history of DM2, PPM, HTN, BPH, glaucoma, DJD, depression, CHB sp PPM, chronic combined CHF, CAD and atrial fibrillation and admitted for SOB and low oxygen saturations  Clinical Impression  Pt admitted with above diagnosis. Pt currently with functional limitations due to the deficits listed below (see PT Problem List).  Pt will benefit from skilled PT to increase their independence and safety with mobility to allow discharge to the venue listed below.  Pt fatigues quickly and presents with unsteady gait.  Pt would benefit from post acute PT upon d/c.  Pt may be reluctant to d/c to SNF so if not agreeable, recommend RW and HHPT.  Pt required supplemental oxygen for ambulation.  SATURATION QUALIFICATIONS: (This note is used to comply with regulatory documentation for home oxygen)  Patient Saturations on Room Air at Rest = 92%  Patient Saturations on Room Air while Ambulating = 86%  Patient Saturations on 2 Liters of oxygen while Ambulating = 90%  Please briefly explain why patient needs home oxygen: to maintain oxygen saturations above 88% during physical activity such as ambulation.     Follow Up Recommendations SNF;Supervision/Assistance - 24 hour    Equipment Recommendations  Rolling walker with 5" wheels    Recommendations for Other Services       Precautions / Restrictions Precautions Precautions: Fall Precaution Comments: monitor sats      Mobility  Bed Mobility Overal bed mobility: Needs Assistance Bed Mobility: Supine to Sit     Supine to sit: Supervision;HOB elevated        Transfers Overall transfer level: Needs assistance Equipment used: Straight cane Transfers: Sit to/from Stand Sit to Stand: Min guard         General transfer comment: verbal cues for hand placement to self  assist  Ambulation/Gait Ambulation/Gait assistance: Min guard Ambulation Distance (Feet): 100 Feet Assistive device: Straight cane Gait Pattern/deviations: Step-through pattern;Decreased stride length     General Gait Details: pt unsteady and had LOB with turns however able to self correct, min/guard for safety due to fall risk, SpO2 dropped to 86% room air, pt fatigued quickly  Stairs            Wheelchair Mobility    Modified Rankin (Stroke Patients Only)       Balance                                             Pertinent Vitals/Pain Pain Assessment: No/denies pain    Home Living Family/patient expects to be discharged to:: Private residence Living Arrangements: Spouse/significant other Available Help at Discharge: Family;Available PRN/intermittently Type of Home: House Home Access: Stairs to enter   [employer] of Steps: 1 Home Layout: Able to live on main level with bedroom/bathroom Home Equipment: Kasandra Knudsen - single point      Prior Function Level of Independence: Independent with assistive device(s)   Gait / Transfers Assistance Needed: typically uses SPC           Hand Dominance        Extremity/Trunk Assessment        Lower Extremity Assessment Lower Extremity Assessment: Generalized weakness       Communication   Communication: HOH  Cognition Arousal/Alertness: Awake/alert Behavior During Therapy:  WFL for tasks assessed/performed Overall Cognitive Status: Within Functional Limits for tasks assessed                                        General Comments      Exercises     Assessment/Plan    PT Assessment Patient needs continued PT services  PT Problem List Decreased strength;Decreased mobility;Decreased balance;Decreased activity tolerance;Cardiopulmonary status limiting activity;Decreased knowledge of use of DME       PT Treatment Interventions Gait training;Therapeutic  activities;DME instruction;Therapeutic exercise;Functional mobility training;Patient/family education    PT Goals (Current goals can be found in the Care Plan section)  Acute Rehab PT Goals Patient Stated Goal[place] PT Goal Formulation: With patient Time For Goal Achievement: 09/06/16 Potential to Achieve Goals: Good    Frequency Min 3X/week   Barriers to discharge        Co-evaluation               AM-PAC PT "6 Clicks" Daily Activity  Outcome Measure Difficulty turning over in bed (including adjusting bedclothes, sheets and blankets)?: None Difficulty moving from lying on back to sitting on the side of the bed? : Total Difficulty sitting down on and standing up from a chair with arms (e.g., wheelchair, bedside commode, etc,.)?: A Little Help needed moving to and from a bed to chair (including a wheelchair)?: A Little Help needed walking in hospital room?: A Little Help needed climbing 3-5 steps with a railing? : A Lot 6 Click Score: 16    End of Session Equipment Utilized During Treatment: Gait belt;Oxygen Activity Tolerance: Patient limited by fatigue Patient left: in chair;with call bell/phone within reach;with chair alarm set Nurse Communication: Mobility status PT Visit Diagnosis: Difficulty in walking, not elsewhere classified (R26.2)    Time: 1040-1055 PT Time Calculation (min) (ACUTE ONLY): 15 min   Charges:   PT Evaluation $PT Eval Low Complexity: 1 Procedure     PT G CodesCarmelia Bake, PT, DPT 08/30/2016 Pager: [phone]   York Ram E 08/30/2016, 12:49 PM

## 2016-08-30 NOTE — Progress Notes (Signed)
PHARMACY NOTE -  ANTIBIOTIC RENAL DOSE ADJUSTMENT   Request received for Pharmacy to assist with antibiotic renal dose adjustment.  Patient has been initiated on Ceftriaxone 1gm iv q24hr   for CAP. SCr 1.11, estimated CrCl ~50 ml/min Current dosage is appropriate and need for further dosage adjustment appears unlikely at present. Will sign off at this time.  Please reconsult if a change in clinical status warrants re-evaluation of dosage.

## 2016-08-30 NOTE — Assessment & Plan Note (Signed)
No choice but to admit at this point

## 2016-08-30 NOTE — Assessment & Plan Note (Addendum)
DDx for diffuse pulmonary infiltrates: Miscellaneous:Alv microlithiasis, alv proteinosis, asp, bronchiectais, BOOP   ARDS/ AIP Occupational dz/ HSP Neoplasm Infection - elevated wbc noted  Drug  No obvious suspects  Pulmonary emboli - unlikely on eliquis  Edema - note bnp < 100 so Mitral valve dz possible but unlikely any other form of chf  Eosinophilic dz - note EOS 0.6  Sarcoidosis Connective tissue dz - check esr Hist X / Hemorrhage - seems unlikely with no h/o hemoptysis  Idiopathic    rec admit and check echo/ rx abx for cap / check esr and HSP serology / pct/consider steroid trial short term rather than attempt bx  Total time devoted to counseling  > 50 % of initial 60 min office visit:  review case with pt/ discussion of options/alternatives/ personally creating written customized instructions  in presence of pt  then going over those specific  Instructions directly with the pt including how to use all of the meds but in particular covering each new medication in detail and the difference between the maintenance= "automatic" meds and the prns using an action plan format for the latter (If this problem/symptom => do that organization reading Left to right).  Please see AVS from this visit for a full list of these instructions which I personally wrote for this pt and  are unique to this visit.

## 2016-08-30 NOTE — Progress Notes (Signed)
@IPLOG @        PROGRESS NOTE                                                                                                                                                                                                             Patient Demographics:    Randall Odom, is a 83 y.o. male, DOB - Jun 27, 1926, ONG:[id number]  Admit date - 08/29/2016   Admitting Physician Roney Jaffe, MD  Outpatient Primary MD for the patient is Avva, Steva Ready, MD  LOS - 1  Chief Complaint  Patient presents with  . Shortness of Breath       Brief Narrative   Randall Odom is a 83 y.o. male w/ history of DM2, PPM, HTN, BPH, glaucoma, DJD, depression, CHB sp PPM, chronic combined CHF, CAD and atrial fibrillation.  He is HOH and his wife provides all the history.  Here for progressive DOE x > weeks.  Has cough w/ slight whitish sputum, no CP, no hemoptysis, no fevers or chills, Went to see his pulmonary physician Dr. Melvyn Novas after missing several appointments and was sent to the hospital for evaluation of his shortness of breath and further workup.   Subjective:    Randall Odom today has, No headache, No chest pain, No abdominal pain - No Nausea, No new weakness tingling or numbness, No Cough - improved SOB.     Assessment  & Plan :     1. Acute on chronic hypoxic respiratory failure due to underlying atypical interstitial lung process versus atypical pneumonia. Pulmonary critical care on board, currently kept on IV Solu-Medrol along with Rocephin and azithromycin, continue supportive care with oxygen and nebulizer treatments. Further workup per pulmonary may require bronchoscopy at some point.  2. Hypertension. On ACE inhibitor continue.  3. History of paroxysmal atrial fibrillation. On Eliquis continue, not on any rate controlling agents.  4. History of chronic systolic CHF EF 44% in 0102. Appears well compensated, continue Lasix and monitor. Repeat echo pending.  5. Dyslipidemia. On statin  continue.   6. BPH. On Terazosin.   7. DM type II. Currently on glipizide along with sliding scale, monitor CBGs while he is on Solu-Medrol.  CBG (last 3)   Recent Labs  08/29/16 2308 08/30/16 0840 08/30/16 1230  GLUCAP 188* 178* 186*      Diet : Diet Carb Modified Fluid consistency: Thin; Room service appropriate? Yes   Family Communication  :   None  Code Status :  Full  Disposition Plan  :  TBD  Consults  :  PCCM  Procedures  :    TTE.  DVT Prophylaxis  : Eliquis  Lab Results  Component Value Date   PLT 304 08/30/2016    Inpatient Medications  Scheduled Meds: . apixaban  5 mg Oral BID  . cholecalciferol  2,000 Units Oral Daily  . furosemide  20 mg Oral Daily  . glipiZIDE  5 mg Oral QAC breakfast  . insulin aspart  0-5 Units Subcutaneous QHS  . insulin aspart  0-9 Units Subcutaneous TID WC  . latanoprost  1 drop Both Eyes QHS  . lisinopril  5 mg Oral Daily  . methylPREDNISolone (SOLU-MEDROL) injection  40 mg Intravenous Q6H  . simvastatin  40 mg Oral QHS  . sodium chloride flush  3 mL Intravenous Q12H  . sodium chloride flush  3 mL Intravenous Q12H  . terazosin  5 mg Oral QHS   Continuous Infusions: . sodium chloride    . azithromycin    . cefTRIAXone (ROCEPHIN)  IV     PRN Meds:.sodium chloride, acetaminophen **OR** acetaminophen, fluticasone, ondansetron **OR** ondansetron (ZOFRAN) IV, polyethylene glycol, sodium chloride flush  Antibiotics  :    Anti-infectives    Start     Dose/Rate Route Frequency Ordered Stop   08/30/16 1800  azithromycin (ZITHROMAX) 500 mg in dextrose 5 % 250 mL IVPB     500 mg 250 mL/hr over 60 Minutes Intravenous Every 24 hours 08/30/16 0558     08/30/16 1800  cefTRIAXone (ROCEPHIN) 1 g in dextrose 5 % 50 mL IVPB     1 g 100 mL/hr over 30 Minutes Intravenous Every 24 hours 08/30/16 0601     08/29/16 1845  cefTRIAXone (ROCEPHIN) 1 g in dextrose 5 % 50 mL IVPB     1 g 100 mL/hr over 30 Minutes Intravenous  Once  08/29/16 1837 08/29/16 1942   08/29/16 1845  azithromycin (ZITHROMAX) 500 mg in dextrose 5 % 250 mL IVPB     500 mg 250 mL/hr over 60 Minutes Intravenous  Once 08/29/16 1837 08/29/16 2156         Objective:   Vitals:   08/29/16 1919 08/29/16 1921 08/29/16 2116 08/30/16 0532  BP:   (!) 121/54 (!) 120/56  Pulse:  74 74 71  Resp:   20 20  Temp:  97.6 F (36.4 C) 97.8 F (36.6 C) 97.9 F (36.6 C)  TempSrc:  Oral Oral Oral  SpO2: 93%  93% 94%  Weight:   93.1 kg (205 lb 4 oz) 93.1 kg (205 lb 4 oz)  Height:   5\' 9"  (1.753 m)     Wt Readings from Last 3 Encounters:  08/30/16 93.1 kg (205 lb 4 oz)  08/29/16 93.4 kg (206 lb)  07/10/16 99 kg (218 lb 3.2 oz)     Intake/Output Summary (Last 24 hours) at 08/30/16 1302 Last data filed at 08/30/16 0900  Gross per 24 hour  Intake              540 ml  Output                0 ml  Net              540 ml     Physical Exam  Awake Alert, Oriented X 3, No new F.N deficits, Normal affect [name].AT,PERRAL Supple Neck,No JVD, No cervical lymphadenopathy appriciated.  Symmetrical Chest wall movement, Good air movement bilaterally, few rales RRR,No Gallops,Rubs or new Murmurs, No Parasternal Heave +  ve B.Sounds, Abd Soft, No tenderness, No organomegaly appriciated, No rebound - guarding or rigidity. No Cyanosis, Clubbing or edema, No new Rash or bruise      Data Review:    CBC  Recent Labs Lab 08/29/16 1737 08/30/16 0531  WBC 18.2* 15.1*  HGB 14.0 12.6*  HCT 42.3 38.9*  PLT 338 304  MCV 94.6 94.9  MCH 31.3 30.7  MCHC 33.1 32.4  RDW 13.0 13.1  LYMPHSABS 2.9  --   MONOABS 2.9*  --   EOSABS 0.6  --   BASOSABS 0.1  --     Chemistries   Recent Labs Lab 08/29/16 1737 08/30/16 0531  NA 143 142  K 3.9 3.5  CL 105 107  CO2 27 26  GLUCOSE 153* 160*  BUN 14 18  CREATININE 1.11 1.07  CALCIUM 9.2 8.9  AST 27  --   ALT 26  --   ALKPHOS 83  --   BILITOT 0.6  --     ------------------------------------------------------------------------------------------------------------------ No results for input(s): CHOL, HDL, LDLCALC, TRIG, CHOLHDL, LDLDIRECT in the last 72 hours.  Lab Results  Component Value Date   HGBA1C 6.0 (H) 05/18/2012   ------------------------------------------------------------------------------------------------------------------ No results for input(s): TSH, T4TOTAL, T3FREE, THYROIDAB in the last 72 hours.  Invalid input(s): FREET3 ------------------------------------------------------------------------------------------------------------------ No results for input(s): VITAMINB12, FOLATE, FERRITIN, TIBC, IRON, RETICCTPCT in the last 72 hours.  Coagulation profile No results for input(s): INR, PROTIME in the last 168 hours.  No results for input(s): DDIMER in the last 72 hours.  Cardiac Enzymes No results for input(s): CKMB, TROPONINI, MYOGLOBIN in the last 168 hours.  Invalid input(s): CK ------------------------------------------------------------------------------------------------------------------    Component Value Date/Time   BNP 93.0 08/29/2016 1737    Micro Results No results found for this or any previous visit (from the past 240 hour(s)).  Radiology Reports Dg Chest 2 View  Result Date: 08/29/2016 CLINICAL DATA:  Patient here with complaints of low oxygen level while at pulmonologist visit for follow up. Increased SOB x3 weeks. Denies chest pain. Hx chf, a-fib, htn, ex-smoker. EXAM: CHEST  2 VIEW COMPARISON:  07/11/2014 FINDINGS: Patient has right-sided transvenous pacemaker with leads to the right atrium and right ventricle. Shallow lung inflation. Heart is mildly enlarged. There are patchy infiltrates involving the right lung and the left lower lobe, favoring infectious process. IMPRESSION: Interval development of bilateral airspace filling opacities, likely infectious in origin. Asymmetric pulmonary edema  could have a similar appearance. Electronically Signed   By: Nolon Nations M.D.   On: 08/29/2016 18:20   Ct Chest Wo Contrast  Result Date: 08/29/2016 CLINICAL DATA:  Cough and dyspnea EXAM: CT CHEST WITHOUT CONTRAST TECHNIQUE: Multidetector CT imaging of the chest was performed following the standard protocol without IV contrast. COMPARISON:  Chest CT 03/27/2015 FINDINGS: Cardiovascular: The heart is enlarged. There pacemaker leads in the right atrium, right ventricle and coronary sinus. No pericardial effusion. There are coronary artery atherosclerotic calcifications. There is extensive atherosclerotic calcification of the aorta. Main pulmonary artery is mildly enlarged. Mediastinum/Nodes: 12 mm level 4R lymph node. Other mediastinal nodes measure up to 13 mm. No axillary adenopathy. Normal thyroid. Lungs/Pleura: There is extensive consolidation throughout the right lung. There is a medium-sized right pleural effusion. There are locules of air within the effusion. There are additional scattered areas of consolidation in the left lung. Upper Abdomen: There is a 5 mm calculus at the right renal hilum. The partially visualized left renal pelvis is dilated. Musculoskeletal: No chest wall mass or  suspicious bone lesions identified. IMPRESSION: 1. Multiple large areas of consolidation within both lungs, worse on the right. Differential diagnosis is broad, including multifocal pneumonia (favored), granulomatous disease such as sarcoidosis or inhalation/hypersensitivity disease such as pneumoconiosis. Correlation with laboratory values and bronchoscopy/bronchoalveolar lavage may be helpful. 2.  Aortic Atherosclerosis (ICD10-I70.0). Electronically Signed   By: Ulyses Jarred M.D.   On: 08/29/2016 20:56    Time Spent in minutes  30   Lala Lund M.D on 08/30/2016 at 1:02 PM  Between 7am to 7pm - Pager - [phone] ( page via [email or web address], text pages only, please mention full 10 digit call back number). After  7pm go to [email or web address] - password [email or web address]

## 2016-08-31 ENCOUNTER — Inpatient Hospital Stay (HOSPITAL_COMMUNITY): Payer: Medicare Other

## 2016-08-31 DIAGNOSIS — J189 Pneumonia, unspecified organism: Secondary | ICD-10-CM

## 2016-08-31 LAB — GLUCOSE, CAPILLARY
GLUCOSE-CAPILLARY: 290 mg/dL — AB (ref 65–99)
GLUCOSE-CAPILLARY: 336 mg/dL — AB (ref 65–99)
Glucose-Capillary: 254 mg/dL — ABNORMAL HIGH (ref 65–99)
Glucose-Capillary: 135 mg/dL — ABNORMAL HIGH (ref 65–99)

## 2016-08-31 LAB — HEMOGLOBIN A1C
HEMOGLOBIN A1C: 7.5 % — AB (ref 4.8–5.6)
MEAN PLASMA GLUCOSE: 169 mg/dL

## 2016-08-31 MED ORDER — INSULIN GLARGINE 100 UNIT/ML ~~LOC~~ SOLN
20.0000 [IU] | Freq: Every day | SUBCUTANEOUS | Status: DC
  Administered 2016-08-31 – 2016-09-02 (×3): 20 [IU] via SUBCUTANEOUS
  Filled 2016-08-31 (×3): qty 0.2

## 2016-08-31 MED ORDER — AZITHROMYCIN 250 MG PO TABS
500.0000 mg | ORAL_TABLET | Freq: Every day | ORAL | Status: DC
  Administered 2016-08-31: 500 mg via ORAL
  Filled 2016-08-31: qty 2

## 2016-08-31 MED ORDER — METHYLPREDNISOLONE SODIUM SUCC 40 MG IJ SOLR
40.0000 mg | Freq: Two times a day (BID) | INTRAMUSCULAR | Status: DC
  Administered 2016-08-31 – 2016-09-01 (×2): 40 mg via INTRAVENOUS
  Filled 2016-08-31 (×2): qty 1

## 2016-08-31 MED ORDER — FUROSEMIDE 10 MG/ML IJ SOLN
40.0000 mg | Freq: Once | INTRAMUSCULAR | Status: AC
  Administered 2016-08-31: 40 mg via INTRAVENOUS
  Filled 2016-08-31: qty 4

## 2016-08-31 NOTE — Progress Notes (Signed)
PCCM Progress Note  Admission date: 08/29/2016 Consult date: 08/29/2016 (0utpt / pulmonary clinic)  Referring provider: Dr. Candiss Norse, [facility]   CC: Short of breath  History of Present Illness  83 yo male former smoker with cough, dyspnea, chest congestion x 4 weeks and progressive R> L  lung infiltrate    Subjective: Better on 02 2lpm   Vital signs: BP 140/64 (BP Location: Right Arm)   Pulse 74   Temp 98.7 F (37.1 C) (Oral)   Resp 18   Ht 5' 9"  (1.753 m)   Wt 208 lb 8.9 oz (94.6 kg)   SpO2 91%   BMI 30.80 kg/m   Intake/output: I/O last 3 completed shifts: In: 1560 [P.O.:960; IV Piggyback:600] Out: -   General: pleasant Neuro: normal strength HEENT: no stridor Cardiac: regular, no murmur Chest: insp and exp rhonchi bilaterally  R > L  Abd: soft, non tender Ext: no edema Skin: no rashes   CMP Latest Ref Rng & Units 08/30/2016 08/29/2016 11/16/2015  Glucose 65 - 99 mg/dL 160(H) 153(H) 192(H)  BUN 6 - 20 mg/dL 18 14 11   Creatinine 0.61 - 1.24 mg/dL 1.07 1.11 1.11  Sodium 135 - 145 mmol/L 142 143 142  Potassium 3.5 - 5.1 mmol/L 3.5 3.9 3.7  Chloride 101 - 111 mmol/L 107 105 108  CO2 22 - 32 mmol/L 26 27 24   Calcium 8.9 - 10.3 mg/dL 8.9 9.2 9.4  Total Protein 6.5 - 8.1 g/dL - 6.9 -  Total Bilirubin 0.3 - 1.2 mg/dL - 0.6 -  Alkaline Phos 38 - 126 U/L - 83 -  AST 15 - 41 U/L - 27 -  ALT 17 - 63 U/L - 26 -     CBC Latest Ref Rng & Units 08/30/2016 08/29/2016 11/16/2015  WBC 4.0 - 10.5 K/uL 15.1(H) 18.2(H) 11.6(H)  Hemoglobin 13.0 - 17.0 g/dL 12.6(L) 14.0 14.5  Hematocrit 39.0 - 52.0 % 38.9(L) 42.3 44.1  Platelets 150 - 400 K/uL 304 338 162     ABG    Component Value Date/Time   TCO2 27 05/06/2011 1213     CBG (last 3)   Recent Labs  08/30/16 2218 08/31/16 0747 08/31/16 1146  GLUCAP 287* 290* 336*    Lab Results  Component Value Date   ESRSEDRATE 46 (H) 08/30/2016    BNP    Component Value Date/Time   BNP 93.0 08/29/2016 1737    Imaging: Dg  Chest 2 View  Result Date: 08/31/2016 CLINICAL DATA:  Pulmonary infiltrate.  Follow-up. EXAM: CHEST  2 VIEW COMPARISON:  Chest CT August 29, 2016.  Chest x-ray August 29, 2016. FINDINGS: No pneumothorax. Stable pacemaker. Persistent infiltrate in the right mid and lower lungs as well as the left base. No other interval changes. IMPRESSION: No significant change in multifocal infiltrate. Recommend follow-up to resolution. Electronically Signed   By: Dorise Bullion III M.D   On: 08/31/2016 11:08   Dg Chest 2 View  Result Date: 08/29/2016 CLINICAL DATA:  Patient here with complaints of low oxygen level while at pulmonologist visit for follow up. Increased SOB x3 weeks. Denies chest pain. Hx chf, a-fib, htn, ex-smoker. EXAM: CHEST  2 VIEW COMPARISON:  07/11/2014 FINDINGS: Patient has right-sided transvenous pacemaker with leads to the right atrium and right ventricle. Shallow lung inflation. Heart is mildly enlarged. There are patchy infiltrates involving the right lung and the left lower lobe, favoring infectious process. IMPRESSION: Interval development of bilateral airspace filling opacities, likely infectious in origin. Asymmetric pulmonary  edema could have a similar appearance. Electronically Signed   By: Nolon Nations M.D.   On: 08/29/2016 18:20   Ct Chest Wo Contrast  Result Date: 08/29/2016 CLINICAL DATA:  Cough and dyspnea EXAM: CT CHEST WITHOUT CONTRAST TECHNIQUE: Multidetector CT imaging of the chest was performed following the standard protocol without IV contrast. COMPARISON:  Chest CT 03/27/2015 FINDINGS: Cardiovascular: The heart is enlarged. There pacemaker leads in the right atrium, right ventricle and coronary sinus. No pericardial effusion. There are coronary artery atherosclerotic calcifications. There is extensive atherosclerotic calcification of the aorta. Main pulmonary artery is mildly enlarged. Mediastinum/Nodes: 12 mm level 4R lymph node. Other mediastinal nodes measure up to 13 mm.  No axillary adenopathy. Normal thyroid. Lungs/Pleura: There is extensive consolidation throughout the right lung. There is a medium-sized right pleural effusion. There are locules of air within the effusion. There are additional scattered areas of consolidation in the left lung. Upper Abdomen: There is a 5 mm calculus at the right renal hilum. The partially visualized left renal pelvis is dilated. Musculoskeletal: No chest wall mass or suspicious bone lesions identified. IMPRESSION: 1. Multiple large areas of consolidation within both lungs, worse on the right. Differential diagnosis is broad, including multifocal pneumonia (favored), granulomatous disease such as sarcoidosis or inhalation/hypersensitivity disease such as pneumoconiosis. Correlation with laboratory values and bronchoscopy/bronchoalveolar lavage may be helpful. 2.  Aortic Atherosclerosis (ICD10-I70.0). Electronically Signed   By: Ulyses Jarred M.D.   On: 08/29/2016 20:56         Antibiotics: Rocephin 6/21 >> Zithromax 6/21 >>  Micro:  Blood 6/21 >> PCT  622 :  0.17  Quantiferon GOLD TB 6/23 Sputum for afb 6/23 >>     Events: 6/21 Admit  6/22 ESR 46 so added solumedrol  160 mg daily      Assessment/ plan  Pulmonary infiltrates with acute hypoxic respiratory failure. DDx for diffuse pulmonary infiltrates: Miscellaneous:Alv microlithiasis, alv proteinosis, asp, bronchiectais, BOOP   ARDS/ AIP Occupational dz/ HSP > serology sent 6/21  Neoplasm Infection - elevated wbc noted but pct < .30 - need Quantiferon Gold TB/ sputum to be complete  Drug  No obvious suspects  Pulmonary emboli - unlikely on eliquis  Edema - note bnp < 100 so Mitral valve dz possible but unlikely any other form of chf  - note echo is abnormal so may have element of chf  Eosinophilic dz - note EOS 0.6  Sarcoidosis Connective tissue dz - ESR mod elevated so sending RA/ ana  Hist X / Hemorrhage - seems unlikely with no h/o hemoptysis   Idiopathic       Christinia Gully, MD [facility]monary and [place] [phone] After 5:30 PM or weekends, use [facility] [phone]

## 2016-08-31 NOTE — Progress Notes (Signed)
@IPLOG @        PROGRESS NOTE                                                                                                                                                                                                             Patient Demographics:    Randall Odom, is a 83 y.o. male, DOB - [birth year], DGU:[id number]  Admit date - 08/29/2016   Admitting Physician Roney Jaffe, MD  Outpatient Primary MD for the patient is Avva, Steva Ready, MD  LOS - 2  Chief Complaint  Patient presents with  . Shortness of Breath       Brief Narrative   Randall Odom is a 83 y.o. male w/ history of DM2, PPM, HTN, BPH, glaucoma, DJD, depression, CHB sp PPM, chronic combined CHF, CAD and atrial fibrillation.  He is HOH and his wife provides all the history.  Here for progressive DOE x > weeks.  Has cough w/ slight whitish sputum, no CP, no hemoptysis, no fevers or chills, Went to see his pulmonary physician Dr. Melvyn Novas after missing several appointments and was sent to the hospital for evaluation of his shortness of breath and further workup.   Subjective:    Patient in bed, appears comfortable, denies any headache, no fever, no chest pain or pressure, improved shortness of breath , no abdominal pain. No focal weakness.      Assessment  & Plan :     1. Acute on chronic hypoxic respiratory failure due to underlying atypical interstitial lung process versus atypical pneumonia.Pulmonary following he is currently on low-dose steroids along with IV antibiotics, also initiated diuresis, clinically improving, for now he has qualified for home oxygen which will be continued upon discharge, continue nebulizer treatments as needed, likely discharge in the morning if stable with outpatient pulmonary follow-up with Dr. Melvyn Novas.  2. Hypertension. Stable on ACE inhibitor.  3. History of paroxysmal atrial fibrillation. Continue on Eliquis, not on any rate controlling agents.   4. History of chronic systolic CHF  EF 95% in 6387. Repeat echo similar to one in 2016 with similar EF and wall motion abnormality, we'll challenge with IV Lasix one dose and monitor response, continue ACE inhibitor.  5. Dyslipidemia. Continue on statin.  6. BPH. On Terazosin.   7. DM type II. Currently on glipizide along with sliding scale, while on Solu-Medrol will add Lantus for better control.  CBG (last 3)   Recent Labs  08/30/16 1709 08/30/16 2218 08/31/16 0747  GLUCAP 166* 287* 290*  Diet : Diet Carb Modified Fluid consistency: Thin; Room service appropriate? Yes   Family Communication  :   None  Code Status :  Full  Disposition Plan  :  TBD  Consults  :  PCCM  Procedures  :    TTE.  DVT Prophylaxis  : Eliquis  Lab Results  Component Value Date   PLT 304 08/30/2016    Inpatient Medications  Scheduled Meds: . apixaban  5 mg Oral BID  . cholecalciferol  2,000 Units Oral Daily  . glipiZIDE  5 mg Oral QAC breakfast  . insulin aspart  0-5 Units Subcutaneous QHS  . insulin aspart  0-9 Units Subcutaneous TID WC  . latanoprost  1 drop Both Eyes QHS  . lisinopril  5 mg Oral Daily  . methylPREDNISolone (SOLU-MEDROL) injection  40 mg Intravenous Q12H  . simvastatin  40 mg Oral QHS  . sodium chloride flush  3 mL Intravenous Q12H  . terazosin  5 mg Oral QHS   Continuous Infusions: . azithromycin Stopped (08/30/16 1907)  . cefTRIAXone (ROCEPHIN)  IV Stopped (08/30/16 1738)   PRN Meds:.acetaminophen **OR** [DISCONTINUED] acetaminophen, fluticasone, ondansetron **OR** ondansetron (ZOFRAN) IV, polyethylene glycol  Antibiotics  :    Anti-infectives    Start     Dose/Rate Route Frequency Ordered Stop   08/30/16 1800  azithromycin (ZITHROMAX) 500 mg in dextrose 5 % 250 mL IVPB     500 mg 250 mL/hr over 60 Minutes Intravenous Every 24 hours 08/30/16 0558     08/30/16 1800  cefTRIAXone (ROCEPHIN) 1 g in dextrose 5 % 50 mL IVPB     1 g 100 mL/hr over 30 Minutes Intravenous Every 24 hours  08/30/16 0601     08/29/16 1845  cefTRIAXone (ROCEPHIN) 1 g in dextrose 5 % 50 mL IVPB     1 g 100 mL/hr over 30 Minutes Intravenous  Once 08/29/16 1837 08/29/16 1942   08/29/16 1845  azithromycin (ZITHROMAX) 500 mg in dextrose 5 % 250 mL IVPB     500 mg 250 mL/hr over 60 Minutes Intravenous  Once 08/29/16 1837 08/29/16 2156         Objective:   Vitals:   08/30/16 1255 08/30/16 2100 08/31/16 0440 08/31/16 0628  BP: (!) 122/59 122/68 140/64   Pulse: 77   74  Resp: 18 18 18    Temp: 98 F (36.7 C) 98.6 F (37 C) 98.7 F (37.1 C)   TempSrc: Oral Oral Oral   SpO2: 95% 98% 91%   Weight:   94.6 kg (208 lb 8.9 oz)   Height:        Wt Readings from Last 3 Encounters:  08/31/16 94.6 kg (208 lb 8.9 oz)  08/29/16 93.4 kg (206 lb)  07/10/16 99 kg (218 lb 3.2 oz)     Intake/Output Summary (Last 24 hours) at 08/31/16 1128 Last data filed at 08/31/16 0831  Gross per 24 hour  Intake             1500 ml  Output                0 ml  Net             1500 ml     Physical Exam  Awake Alert, Oriented X 3, No new F.N deficits, Normal affect [place].AT,PERRAL Supple Neck,No JVD, No cervical lymphadenopathy appriciated.  Symmetrical Chest wall movement, Good air movement bilaterally, Minimal rales bilaterally RRR,No Gallops,Rubs or new Murmurs, No Parasternal Heave +ve  B.Sounds, Abd Soft, No tenderness, No organomegaly appriciated, No rebound - guarding or rigidity. No Cyanosis, Clubbing or edema, No new Rash or bruise      Data Review:    CBC  Recent Labs Lab 08/29/16 1737 08/30/16 0531  WBC 18.2* 15.1*  HGB 14.0 12.6*  HCT 42.3 38.9*  PLT 338 304  MCV 94.6 94.9  MCH 31.3 30.7  MCHC 33.1 32.4  RDW 13.0 13.1  LYMPHSABS 2.9  --   MONOABS 2.9*  --   EOSABS 0.6  --   BASOSABS 0.1  --     Chemistries   Recent Labs Lab 08/29/16 1737 08/30/16 0531  NA 143 142  K 3.9 3.5  CL 105 107  CO2 27 26  GLUCOSE 153* 160*  BUN 14 18  CREATININE 1.11 1.07  CALCIUM 9.2 8.9   AST 27  --   ALT 26  --   ALKPHOS 83  --   BILITOT 0.6  --    ------------------------------------------------------------------------------------------------------------------ No results for input(s): CHOL, HDL, LDLCALC, TRIG, CHOLHDL, LDLDIRECT in the last 72 hours.  Lab Results  Component Value Date   HGBA1C 7.5 (H) 08/30/2016   ------------------------------------------------------------------------------------------------------------------ No results for input(s): TSH, T4TOTAL, T3FREE, THYROIDAB in the last 72 hours.  Invalid input(s): FREET3 ------------------------------------------------------------------------------------------------------------------ No results for input(s): VITAMINB12, FOLATE, FERRITIN, TIBC, IRON, RETICCTPCT in the last 72 hours.  Coagulation profile No results for input(s): INR, PROTIME in the last 168 hours.  No results for input(s): DDIMER in the last 72 hours.  Cardiac Enzymes No results for input(s): CKMB, TROPONINI, MYOGLOBIN in the last 168 hours.  Invalid input(s): CK ------------------------------------------------------------------------------------------------------------------    Component Value Date/Time   BNP 93.0 08/29/2016 1737    Micro Results Recent Results (from the past 240 hour(s))  Blood culture (routine x 2)     Status: None (Preliminary result)   Collection Time: 08/29/16  6:51 PM  Result Value Ref Range Status   Specimen Description BLOOD LEFT ANTECUBITAL  Final   Special Requests   Final    BOTTLES DRAWN AEROBIC AND ANAEROBIC Blood Culture adequate volume   Culture   Final    NO GROWTH 2 DAYS Performed at [facility], [address] [place], [place] [address]    Report Status PENDING  Incomplete  Blood culture (routine x 2)     Status: None (Preliminary result)   Collection Time: 08/29/16  6:53 PM  Result Value Ref Range Status   Specimen Description BLOOD RIGHT ANTECUBITAL  Final   Special Requests    Final    BOTTLES DRAWN AEROBIC AND ANAEROBIC Blood Culture adequate volume   Culture   Final    NO GROWTH 2 DAYS Performed at [facility], [address] [place], [place] [address]    Report Status PENDING  Incomplete    Radiology Reports Dg Chest 2 View  Result Date: 08/31/2016 CLINICAL DATA:  Pulmonary infiltrate.  Follow-up. EXAM: CHEST  2 VIEW COMPARISON:  Chest CT August 29, 2016.  Chest x-ray August 29, 2016. FINDINGS: No pneumothorax. Stable pacemaker. Persistent infiltrate in the right mid and lower lungs as well as the left base. No other interval changes. IMPRESSION: No significant change in multifocal infiltrate. Recommend follow-up to resolution. Electronically Signed   By: Dorise Bullion III M.D   On: 08/31/2016 11:08   Dg Chest 2 View  Result Date: 08/29/2016 CLINICAL DATA:  Patient here with complaints of low oxygen level while at pulmonologist visit for  follow up. Increased SOB x3 weeks. Denies chest pain. Hx chf, a-fib, htn, ex-smoker. EXAM: CHEST  2 VIEW COMPARISON:  07/11/2014 FINDINGS: Patient has right-sided transvenous pacemaker with leads to the right atrium and right ventricle. Shallow lung inflation. Heart is mildly enlarged. There are patchy infiltrates involving the right lung and the left lower lobe, favoring infectious process. IMPRESSION: Interval development of bilateral airspace filling opacities, likely infectious in origin. Asymmetric pulmonary edema could have a similar appearance. Electronically Signed   By: Nolon Nations M.D.   On: 08/29/2016 18:20   Ct Chest Wo Contrast  Result Date: 08/29/2016 CLINICAL DATA:  Cough and dyspnea EXAM: CT CHEST WITHOUT CONTRAST TECHNIQUE: Multidetector CT imaging of the chest was performed following the standard protocol without IV contrast. COMPARISON:  Chest CT 03/27/2015 FINDINGS: Cardiovascular: The heart is enlarged. There pacemaker leads in the right atrium, right ventricle and coronary sinus. No pericardial  effusion. There are coronary artery atherosclerotic calcifications. There is extensive atherosclerotic calcification of the aorta. Main pulmonary artery is mildly enlarged. Mediastinum/Nodes: 12 mm level 4R lymph node. Other mediastinal nodes measure up to 13 mm. No axillary adenopathy. Normal thyroid. Lungs/Pleura: There is extensive consolidation throughout the right lung. There is a medium-sized right pleural effusion. There are locules of air within the effusion. There are additional scattered areas of consolidation in the left lung. Upper Abdomen: There is a 5 mm calculus at the right renal hilum. The partially visualized left renal pelvis is dilated. Musculoskeletal: No chest wall mass or suspicious bone lesions identified. IMPRESSION: 1. Multiple large areas of consolidation within both lungs, worse on the right. Differential diagnosis is broad, including multifocal pneumonia (favored), granulomatous disease such as sarcoidosis or inhalation/hypersensitivity disease such as pneumoconiosis. Correlation with laboratory values and bronchoscopy/bronchoalveolar lavage may be helpful. 2.  Aortic Atherosclerosis (ICD10-I70.0). Electronically Signed   By: Ulyses Jarred M.D.   On: 08/29/2016 20:56    Time Spent in minutes  30   Lala Lund M.D on 08/31/2016 at 11:28 AM  Between 7am to 7pm - Pager - [phone] ( page via [email or web address], text pages only, please mention full 10 digit call back number). After 7pm go to [email or web address] - password [email or web address]

## 2016-08-31 NOTE — Progress Notes (Signed)
PHARMACIST - PHYSICIAN COMMUNICATION DR:  Candiss Norse CONCERNING: Antibiotic IV to Oral Route Change Policy  RECOMMENDATION: This patient is receiving azithromycin by the intravenous route.  Based on criteria approved by the Pharmacy and Therapeutics Committee, the antibiotic(s) is/are being converted to the equivalent oral dose form(s).   DESCRIPTION: These criteria include:  Patient being treated for a respiratory tract infection, urinary tract infection, cellulitis or clostridium difficile associated diarrhea if on metronidazole  The patient is not neutropenic and does not exhibit a GI malabsorption state  The patient is eating (either orally or via tube) and/or has been taking other orally administered medications for a least 24 hours  The patient is improving clinically and has a Tmax < 100.5  If you have questions about this conversion, please contact the Pharmacy Department  []   [phone] )  [facility] []   [phone] )  [facility] []   [phone] )  [facility] []   [phone] )  [facility] [x]   [phone] )  [place], [place].D. [phone] 08/31/2016 2:41 PM

## 2016-09-01 LAB — GLUCOSE, CAPILLARY
GLUCOSE-CAPILLARY: 249 mg/dL — AB (ref 65–99)
Glucose-Capillary: 283 mg/dL — ABNORMAL HIGH (ref 65–99)
Glucose-Capillary: 206 mg/dL — ABNORMAL HIGH (ref 65–99)
Glucose-Capillary: 248 mg/dL — ABNORMAL HIGH (ref 65–99)

## 2016-09-01 LAB — BASIC METABOLIC PANEL
Anion gap: 8 (ref 5–15)
BUN: 35 mg/dL — AB (ref 6–20)
CO2: 26 mmol/L (ref 22–32)
CREATININE: 1.07 mg/dL (ref 0.61–1.24)
Calcium: 9 mg/dL (ref 8.9–10.3)
Chloride: 107 mmol/L (ref 101–111)
GFR, EST NON AFRICAN AMERICAN: 60 mL/min — AB (ref >60)
Glucose, Bld: 252 mg/dL — ABNORMAL HIGH (ref 65–99)
Potassium: 4.5 mmol/L (ref 3.5–5.1)
SODIUM: 141 mmol/L (ref 135–145)

## 2016-09-01 MED ORDER — FUROSEMIDE 10 MG/ML IJ SOLN
60.0000 mg | Freq: Once | INTRAMUSCULAR | Status: AC
  Administered 2016-09-01: 60 mg via INTRAVENOUS
  Filled 2016-09-01: qty 6

## 2016-09-01 MED ORDER — PREDNISONE 50 MG PO TABS
50.0000 mg | ORAL_TABLET | Freq: Every day | ORAL | Status: DC
  Administered 2016-09-02: 50 mg via ORAL
  Filled 2016-09-01: qty 1

## 2016-09-01 MED ORDER — LEVOFLOXACIN 750 MG PO TABS
750.0000 mg | ORAL_TABLET | Freq: Every day | ORAL | Status: DC
  Administered 2016-09-01 – 2016-09-02 (×2): 750 mg via ORAL
  Filled 2016-09-01 (×2): qty 1

## 2016-09-01 NOTE — Progress Notes (Signed)
Pharmacy Antibiotic Follow-up Note  Randall Odom is a 83 y.o. year-old male admitted on 08/29/2016.  The patient is currently on day 1 of po Levaquin for acute resp failure.  Assessment/Plan: Levaquin 750mg  po daily, suggested x 5 more days with Prednisone taper  Temp (24hrs), Avg:98.6 F (37 C), Min:98.5 F (36.9 C), Max:98.6 F (37 C)   Recent Labs Lab 08/29/16 1737 08/30/16 0531  WBC 18.2* 15.1*    Recent Labs Lab 08/29/16 1737 08/30/16 0531 09/01/16 0511  CREATININE 1.11 1.07 1.07   Estimated Creatinine Clearance: 54.1 mL/min (by C-G formula based on SCr of 1.07 mg/dL).    Allergies  Allergen Reactions  . Spironolactone Diarrhea and Nausea And Vomiting   Antimicrobials this admission: 6/21 Rocephin (Rx s/o consult) >> 6/24 6/21 Azithro >> 6/24 6/24 po Levaquin >>  Microbiology results:  6/21 BCx: ngtd  Thank you for allowing pharmacy to be a part of this patient's care, Pharmacy will sign off protocol  Minda Ditto PharmD 09/01/2016 9:54 AM

## 2016-09-01 NOTE — Progress Notes (Signed)
@IPLOG @        PROGRESS NOTE                                                                                                                                                                                                             Patient Demographics:    Randall Odom, is a 83 y.o. male, DOB - 09-Sep-1926, GHW:[id number]  Admit date - 08/29/2016   Admitting Physician Roney Jaffe, MD  Outpatient Primary MD for the patient is Avva, Steva Ready, MD  LOS - 3  Chief Complaint  Patient presents with  . Shortness of Breath       Brief Narrative   Randall Odom is a 83 y.o. male w/ history of DM2, PPM, HTN, BPH, glaucoma, DJD, depression, CHB sp PPM, chronic combined CHF, CAD and atrial fibrillation.  He is HOH and his wife provides all the history.  Here for progressive DOE x > weeks.  Has cough w/ slight whitish sputum, no CP, no hemoptysis, no fevers or chills, Went to see his pulmonary physician Dr. Melvyn Novas after missing several appointments and was sent to the hospital for evaluation of his shortness of breath and further workup.   Subjective:   Patient in bed, appears comfortable, denies any headache, no fever, no chest pain or pressure, no shortness of breath , no abdominal pain. No focal weakness.   Assessment  & Plan :     1. Acute on chronic hypoxic respiratory failure due to underlying atypical interstitial lung process versus atypical pneumonia.Seen by pulmonary who were following, he is being placed on IV steroids along with empiric IV antibiotics and diuretics with improvement clinically, chest x-ray largely unchanged, continue nebulizer treatments and oxygen, seems to have qualified for home oxygen, will switch to oral steroids and antibiotics, increase activity and ambulate, if does well likely discharge on 09/02/2016. Patient and wife want to stay another day in the hospital.  2. Hypertension. On ACE inhibitor and stable.  3. History of paroxysmal atrial fibrillation.  Continue on Eliquis, not on any rate controlling agents.   4. Acute on chronic systolic CHF EF 78% . Repeat echo similar to one in 2016 with similar EF and wall motion abnormality, on ACE inhibitor continue, not on beta blocker will defer this to primary cardiologist and PCP, responding well to IV diuresis which will be continued with IV Lasix, will switch to oral Lasix upon discharge.  5. Dyslipidemia. Continue on statin.  6. BPH. On Terazosin.   7. DM type II. Currently on glipizide  along with sliding scale, while on Solu-Medrol will add Lantus for better control.  CBG (last 3)   Recent Labs  08/31/16 1629 08/31/16 2322 09/01/16 0743  GLUCAP 254* 135* 249*      Diet : Diet Carb Modified Fluid consistency: Thin; Room service appropriate? Yes   Family Communication  :   None  Code Status :  Full  Disposition Plan  :  TBD  Consults  :  PCCM  Procedures  :    TTE. - Left ventricle: The cavity size was normal. There was moderate concentric hypertrophy. Systolic function was mildly to moderately reduced. The estimated ejection fraction was in the range of 40% to 45%. Akinesis of the mid-apical anteroseptal, inferoseptal, and apical myocardium. Hypokinesis of the anterior myocardium. The study is not technically sufficient to allow evaluation of LV diastolic function. Doppler parameters are consistent with indeterminate ventricular filling pressure. - Aortic valve: There was mild regurgitation. - Mitral valve: Transvalvular velocity was within the normal range.  There was no evidence for stenosis. There was trivial regurgitation. - Left atrium: The atrium was mildly dilated. - Right ventricle: The cavity size was normal. Wall thickness was normal. Systolic function was normal. - Tricuspid valve: There was mild regurgitation. - Pulmonary arteries: Systolic pressure was within the normal range.  DVT Prophylaxis  : Eliquis  Lab Results  Component Value Date   PLT 304 08/30/2016     Inpatient Medications  Scheduled Meds: . apixaban  5 mg Oral BID  . cholecalciferol  2,000 Units Oral Daily  . furosemide  60 mg Intravenous Once  . glipiZIDE  5 mg Oral QAC breakfast  . insulin aspart  0-5 Units Subcutaneous QHS  . insulin aspart  0-9 Units Subcutaneous TID WC  . insulin glargine  20 Units Subcutaneous Daily  . latanoprost  1 drop Both Eyes QHS  . lisinopril  5 mg Oral Daily  . [START ON 09/02/2016] predniSONE  50 mg Oral Q breakfast  . simvastatin  40 mg Oral QHS  . sodium chloride flush  3 mL Intravenous Q12H  . terazosin  5 mg Oral QHS   Continuous Infusions:  PRN Meds:.acetaminophen **OR** [DISCONTINUED] acetaminophen, fluticasone, [DISCONTINUED] ondansetron **OR** ondansetron (ZOFRAN) IV, polyethylene glycol  Antibiotics  :    Anti-infectives    Start     Dose/Rate Route Frequency Ordered Stop   08/31/16 1800  azithromycin (ZITHROMAX) tablet 500 mg  Status:  Discontinued     500 mg Oral Daily-1800 08/31/16 1440 09/01/16 0932   08/30/16 1800  azithromycin (ZITHROMAX) 500 mg in dextrose 5 % 250 mL IVPB  Status:  Discontinued     500 mg 250 mL/hr over 60 Minutes Intravenous Every 24 hours 08/30/16 0558 08/31/16 1440   08/30/16 1800  cefTRIAXone (ROCEPHIN) 1 g in dextrose 5 % 50 mL IVPB  Status:  Discontinued     1 g 100 mL/hr over 30 Minutes Intravenous Every 24 hours 08/30/16 0601 09/01/16 0932   08/29/16 1845  cefTRIAXone (ROCEPHIN) 1 g in dextrose 5 % 50 mL IVPB     1 g 100 mL/hr over 30 Minutes Intravenous  Once 08/29/16 1837 08/29/16 1942   08/29/16 1845  azithromycin (ZITHROMAX) 500 mg in dextrose 5 % 250 mL IVPB     500 mg 250 mL/hr over 60 Minutes Intravenous  Once 08/29/16 1837 08/29/16 2156         Objective:   Vitals:   08/31/16 6073 08/31/16 1332 08/31/16 2152 09/01/16 7106  BP:  (!) 127/57 (!) 122/54 122/65  Pulse: 74 72 75 65  Resp:  18 18 18   Temp:  98.5 F (36.9 C) 98.6 F (37 C) 98.6 F (37 C)  TempSrc:  Oral Oral Oral   SpO2:  95% 98% 98%  Weight:    94.5 kg (208 lb 5.4 oz)  Height:        Wt Readings from Last 3 Encounters:  09/01/16 94.5 kg (208 lb 5.4 oz)  08/29/16 93.4 kg (206 lb)  07/10/16 99 kg (218 lb 3.2 oz)     Intake/Output Summary (Last 24 hours) at 09/01/16 0932 Last data filed at 09/01/16 0600  Gross per 24 hour  Intake              610 ml  Output              700 ml  Net              -90 ml     Physical Exam  Awake Alert, Oriented X 3, No new F.N deficits, Normal affect [place].AT,PERRAL Supple Neck,No JVD, No cervical lymphadenopathy appriciated.  Symmetrical Chest wall movement, Good air movement bilaterally, few rales RRR,No Gallops,Rubs or new Murmurs, No Parasternal Heave +ve B.Sounds, Abd Soft, No tenderness, No organomegaly appriciated, No rebound - guarding or rigidity. No Cyanosis, Clubbing or edema, No new Rash or bruise    Data Review:    CBC  Recent Labs Lab 08/29/16 1737 08/30/16 0531  WBC 18.2* 15.1*  HGB 14.0 12.6*  HCT 42.3 38.9*  PLT 338 304  MCV 94.6 94.9  MCH 31.3 30.7  MCHC 33.1 32.4  RDW 13.0 13.1  LYMPHSABS 2.9  --   MONOABS 2.9*  --   EOSABS 0.6  --   BASOSABS 0.1  --     Chemistries   Recent Labs Lab 08/29/16 1737 08/30/16 0531 09/01/16 0511  NA 143 142 141  K 3.9 3.5 4.5  CL 105 107 107  CO2 27 26 26   GLUCOSE 153* 160* 252*  BUN 14 18 35*  CREATININE 1.11 1.07 1.07  CALCIUM 9.2 8.9 9.0  AST 27  --   --   ALT 26  --   --   ALKPHOS 83  --   --   BILITOT 0.6  --   --    ------------------------------------------------------------------------------------------------------------------ No results for input(s): CHOL, HDL, LDLCALC, TRIG, CHOLHDL, LDLDIRECT in the last 72 hours.  Lab Results  Component Value Date   HGBA1C 7.5 (H) 08/30/2016   ------------------------------------------------------------------------------------------------------------------ No results for input(s): TSH, T4TOTAL, T3FREE, THYROIDAB in the last  72 hours.  Invalid input(s): FREET3 ------------------------------------------------------------------------------------------------------------------ No results for input(s): VITAMINB12, FOLATE, FERRITIN, TIBC, IRON, RETICCTPCT in the last 72 hours.  Coagulation profile No results for input(s): INR, PROTIME in the last 168 hours.  No results for input(s): DDIMER in the last 72 hours.  Cardiac Enzymes No results for input(s): CKMB, TROPONINI, MYOGLOBIN in the last 168 hours.  Invalid input(s): CK ------------------------------------------------------------------------------------------------------------------    Component Value Date/Time   BNP 93.0 08/29/2016 1737    Micro Results Recent Results (from the past 240 hour(s))  Blood culture (routine x 2)     Status: None (Preliminary result)   Collection Time: 08/29/16  6:51 PM  Result Value Ref Range Status   Specimen Description BLOOD LEFT ANTECUBITAL  Final   Special Requests   Final    BOTTLES DRAWN AEROBIC AND ANAEROBIC Blood Culture adequate volume   Culture  Final    NO GROWTH 2 DAYS Performed at [facility], [place] [address], [place], [place] [address]    Report Status PENDING  Incomplete  Blood culture (routine x 2)     Status: None (Preliminary result)   Collection Time: 08/29/16  6:53 PM  Result Value Ref Range Status   Specimen Description BLOOD RIGHT ANTECUBITAL  Final   Special Requests   Final    BOTTLES DRAWN AEROBIC AND ANAEROBIC Blood Culture adequate volume   Culture   Final    NO GROWTH 2 DAYS Performed at [facility], [place] [address] [place], [place] [address]    Report Status PENDING  Incomplete    Radiology Reports Dg Chest 2 View  Result Date: 08/31/2016 CLINICAL DATA:  Pulmonary infiltrate.  Follow-up. EXAM: CHEST  2 VIEW COMPARISON:  Chest CT August 29, 2016.  Chest x-ray August 29, 2016. FINDINGS: No pneumothorax. Stable pacemaker. Persistent infiltrate in the right mid and lower  lungs as well as the left base. No other interval changes. IMPRESSION: No significant change in multifocal infiltrate. Recommend follow-up to resolution. Electronically Signed   By: Dorise Bullion III M.D   On: 08/31/2016 11:08   Dg Chest 2 View  Result Date: 08/29/2016 CLINICAL DATA:  Patient here with complaints of low oxygen level while at pulmonologist visit for follow up. Increased SOB x3 weeks. Denies chest pain. Hx chf, a-fib, htn, ex-smoker. EXAM: CHEST  2 VIEW COMPARISON:  07/11/2014 FINDINGS: Patient has right-sided transvenous pacemaker with leads to the right atrium and right ventricle. Shallow lung inflation. Heart is mildly enlarged. There are patchy infiltrates involving the right lung and the left lower lobe, favoring infectious process. IMPRESSION: Interval development of bilateral airspace filling opacities, likely infectious in origin. Asymmetric pulmonary edema could have a similar appearance. Electronically Signed   By: Nolon Nations M.D.   On: 08/29/2016 18:20   Ct Chest Wo Contrast  Result Date: 08/29/2016 CLINICAL DATA:  Cough and dyspnea EXAM: CT CHEST WITHOUT CONTRAST TECHNIQUE: Multidetector CT imaging of the chest was performed following the standard protocol without IV contrast. COMPARISON:  Chest CT 03/27/2015 FINDINGS: Cardiovascular: The heart is enlarged. There pacemaker leads in the right atrium, right ventricle and coronary sinus. No pericardial effusion. There are coronary artery atherosclerotic calcifications. There is extensive atherosclerotic calcification of the aorta. Main pulmonary artery is mildly enlarged. Mediastinum/Nodes: 12 mm level 4R lymph node. Other mediastinal nodes measure up to 13 mm. No axillary adenopathy. Normal thyroid. Lungs/Pleura: There is extensive consolidation throughout the right lung. There is a medium-sized right pleural effusion. There are locules of air within the effusion. There are additional scattered areas of consolidation in the  left lung. Upper Abdomen: There is a 5 mm calculus at the right renal hilum. The partially visualized left renal pelvis is dilated. Musculoskeletal: No chest wall mass or suspicious bone lesions identified. IMPRESSION: 1. Multiple large areas of consolidation within both lungs, worse on the right. Differential diagnosis is broad, including multifocal pneumonia (favored), granulomatous disease such as sarcoidosis or inhalation/hypersensitivity disease such as pneumoconiosis. Correlation with laboratory values and bronchoscopy/bronchoalveolar lavage may be helpful. 2.  Aortic Atherosclerosis (ICD10-I70.0). Electronically Signed   By: Ulyses Jarred M.D.   On: 08/29/2016 20:56    Time Spent in minutes  30   Lala Lund M.D on 09/01/2016 at 9:32 AM  Between 7am to 7pm - Pager - [phone] ( page via [email or web address], text pages only, please mention full 10 digit  call back number). After 7pm go to [email or web address] - password [facility]

## 2016-09-01 NOTE — Progress Notes (Signed)
PCCM Progress Note  Admission date: 08/29/2016 Consult date: 08/29/2016 (0utpt / pulmonary clinic)  Referring provider: Dr. Candiss Norse, [facility]   CC: Short of breath  History of Present Illness  83 yo male former smoker with cough, dyspnea, chest congestion x 4 weeks and progressive R>> L  lung infiltrate    Subjective: Walked to nurses station 6/23 on 2lpm / coughing up minimal white mucus   Vital signs: BP 122/65 (BP Location: Right Arm)   Pulse 65   Temp 98.6 F (37 C) (Oral)   Resp 18   Ht 5' 9"  (1.753 m)   Wt 208 lb 5.4 oz (94.5 kg)   SpO2 98%   BMI 30.77 kg/m   Intake/output: I/O last 3 completed shifts: In: 1330 [P.O.:1280; IV Piggyback:50] Out: 700 [Urine:700]  General: pleasant/ chronically ill appearing  Neuro: normal strength HEENT: no stridor Cardiac: regular, no murmur Chest: insp and exp rhonchi bilaterally  R > L with somewhat bronchial changes R mid post chest Abd: soft, non tender quite obese  Ext: no edema Skin: no rashes   CMP Latest Ref Rng & Units 09/01/2016 08/30/2016 08/29/2016  Glucose 65 - 99 mg/dL 252(H) 160(H) 153(H)  BUN 6 - 20 mg/dL 35(H) 18 14  Creatinine 0.61 - 1.24 mg/dL 1.07 1.07 1.11  Sodium 135 - 145 mmol/L 141 142 143  Potassium 3.5 - 5.1 mmol/L 4.5 3.5 3.9  Chloride 101 - 111 mmol/L 107 107 105  CO2 22 - 32 mmol/L 26 26 27   Calcium 8.9 - 10.3 mg/dL 9.0 8.9 9.2  Total Protein 6.5 - 8.1 g/dL - - 6.9  Total Bilirubin 0.3 - 1.2 mg/dL - - 0.6  Alkaline Phos 38 - 126 U/L - - 83  AST 15 - 41 U/L - - 27  ALT 17 - 63 U/L - - 26     CBC Latest Ref Rng & Units 08/30/2016 08/29/2016 11/16/2015  WBC 4.0 - 10.5 K/uL 15.1(H) 18.2(H) 11.6(H)  Hemoglobin 13.0 - 17.0 g/dL 12.6(L) 14.0 14.5  Hematocrit 39.0 - 52.0 % 38.9(L) 42.3 44.1  Platelets 150 - 400 K/uL 304 338 162     ABG    Component Value Date/Time   TCO2 27 05/06/2011 1213     CBG (last 3)   Recent Labs  08/31/16 1629 08/31/16 2322 09/01/16 0743  GLUCAP 254* 135* 249*     Lab Results  Component Value Date   ESRSEDRATE 46 (H) 08/30/2016    BNP    Component Value Date/Time   BNP 93.0 08/29/2016 1737    Imaging: Dg Chest 2 View  Result Date: 08/31/2016 CLINICAL DATA:  Pulmonary infiltrate.  Follow-up. EXAM: CHEST  2 VIEW COMPARISON:  Chest CT August 29, 2016.  Chest x-ray August 29, 2016. FINDINGS: No pneumothorax. Stable pacemaker. Persistent infiltrate in the right mid and lower lungs as well as the left base. No other interval changes. IMPRESSION: No significant change in multifocal infiltrate. Recommend follow-up to resolution. Electronically Signed   By: Dorise Bullion III M.D   On: 08/31/2016 11:08         Antibiotics: Rocephin 6/21 >> Zithromax 6/21 >>  Micro:  Blood 6/21 >> PCT  622 :  0.17  Quantiferon GOLD TB 6/23 Sputum for afb 6/23 >>     Events: 6/21 Admit  6/22 ESR 46 so added solumedrol  160 mg daily      Assessment/ plan  Pulmonary infiltrates with acute hypoxic respiratory failure. DDx for diffuse pulmonary infiltrates: Miscellaneous:Alv microlithiasis,  alv proteinosis, asp, bronchiectais, BOOP   ARDS/ AIP Occupational dz/ HSP > serology sent 6/21  Neoplasm - BAC in ddx  Infection - elevated wbc noted but pct < .30 - need Quantiferon Gold TB/ sputum to be complete - has h/o cavity in RLL and ? Tb eval as child?  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

## 2016-09-02 ENCOUNTER — Inpatient Hospital Stay (HOSPITAL_COMMUNITY): Payer: Medicare Other

## 2016-09-02 DIAGNOSIS — R938 Abnormal findings on diagnostic imaging of other specified body structures: Secondary | ICD-10-CM

## 2016-09-02 LAB — ANTINUCLEAR ANTIBODIES, IFA: ANTINUCLEAR ANTIBODIES, IFA: NEGATIVE

## 2016-09-02 LAB — GLUCOSE, CAPILLARY
GLUCOSE-CAPILLARY: 133 mg/dL — AB (ref 65–99)
Glucose-Capillary: 180 mg/dL — ABNORMAL HIGH (ref 65–99)
Glucose-Capillary: 190 mg/dL — ABNORMAL HIGH (ref 65–99)

## 2016-09-02 LAB — RHEUMATOID FACTOR: Rhuematoid fact SerPl-aCnc: 10.3 IU/mL (ref 0.0–13.9)

## 2016-09-02 MED ORDER — LEVOFLOXACIN 750 MG PO TABS: 750.0000 mg | ORAL_TABLET | Freq: Every day | ORAL | 0 refills | Status: DC

## 2016-09-02 MED ORDER — FUROSEMIDE 40 MG PO TABS: 40.0000 mg | ORAL_TABLET | Freq: Two times a day (BID) | ORAL | 0 refills | Status: DC

## 2016-09-02 MED ORDER — FUROSEMIDE 10 MG/ML IJ SOLN
40.0000 mg | Freq: Once | INTRAMUSCULAR | Status: AC
  Administered 2016-09-02: 40 mg via INTRAVENOUS
  Filled 2016-09-02: qty 4

## 2016-09-02 MED ORDER — INSULIN ASPART 100 UNIT/ML ~~LOC~~ SOLN: SUBCUTANEOUS | 0 refills | Status: DC

## 2016-09-02 MED ORDER — PREDNISONE 5 MG PO TABS: ORAL_TABLET | ORAL | 0 refills | Status: DC

## 2016-09-02 NOTE — Clinical Social Work Placement (Signed)
Pt discharging to [facility] for ST rehab today. Report # for RN [phone] Will be transported via [place] completed medical necessity form and called for transportation. Son notified- has completed admissions paperwork at the SNF All information sent to facility via the [place]  See below for placement details  CLINICAL SOCIAL WORK PLACEMENT  NOTE  Date:  09/02/2016  Patient Details  Name: Randall Odom MRN: [id number] Date of Birth: [birth year]  Clinical Social Work is seeking post-discharge placement for this patient at the [place] level of care (*CSW will initial, date and re-position this form in  chart as items are completed):  Yes   Patient/family provided with [facility] Work Department's list of facilities offering this level of care within the geographic area requested by the patient (or if unable, by the patient's family).  Yes   Patient/family informed of their freedom to choose among providers that offer the needed level of care, that participate in Medicare, Medicaid or managed care program needed by the patient, have an available bed and are willing to accept the patient.  Yes   Patient/family informed of [facility]'s ownership interest in [facility] and [facility], as well as of the fact that they are under no obligation to receive care at these facilities.  PASRR submitted to EDS on 09/02/16     PASRR number received on 09/02/16     Existing PASRR number confirmed on       FL2 transmitted to all facilities in geographic area requested by pt/family on 09/02/16     FL2 transmitted to all facilities within larger geographic area on       Patient informed that his/her managed care company has contracts with or will negotiate with certain facilities, including the following:        Yes   Patient/family informed of bed offers received.  Patient chooses bed at [facility]     Physician recommends and  patient chooses bed at [facility]    Patient to be transferred to [facility] on 09/02/16.  Patient to be transferred to facility by [facility]     Patient family notified on 09/02/16 of transfer.  Name of family member notified:  son Ray     PHYSICIAN       Additional Comment:    _______________________________________________ Nila Nephew, LCSW 09/02/2016, 4:10 PM  [phone]

## 2016-09-02 NOTE — Care Management Important Message (Signed)
Important Message  Patient Details  Name: JAYLYN BOOHER MRN: [id number] Date of Birth: Apr 23, 1926   Medicare Important Message Given:  Yes    Kerin Salen 09/02/2016, 11:22 AMImportant Message  Patient Details  Name: ALBARAA SWINGLE MRN: [id number] Date of Birth: 1926-08-13   Medicare Important Message Given:  Yes    Kerin Salen 09/02/2016, 11:22 AM

## 2016-09-02 NOTE — Clinical Social Work Note (Signed)
Clinical Social Work Assessment  Patient Details  Name: Randall Odom MRN: [id number] Date of Birth: 09-24-1926  Date of referral:  09/02/16               Reason for consult:  Facility Placement                Permission sought to share information with:  Family Supports Permission granted to share information::  Yes, Verbal Permission Granted  Name::     Wife [name], son Paramedic::     Relationship::     Contact Information:     Housing/Transportation Living arrangements for the past 2 months:  Single Family Home Source of Information:  Patient, Adult Children Patient Interpreter Needed:  None Criminal Activity/Legal Involvement Pertinent to Current Situation/Hospitalization:  No - Comment as needed Significant Relationships:  Adult Children, Warehouse manager, Spouse Lives with:  Self, Spouse Do you feel safe going back to the place where you live?  Yes Need for family participation in patient care:  No (Coment)  Care giving concerns:  Pt from home where he lives with his wife. States "she has done a good job taking care of me but I know I need to get a little stronger before I can go back home so she can handle me."   Facilities manager / plan:  CSW consulted for SNF placement.  Met with pt at bedside. Pt's pastor also present, pt stated he could stay for assessment. Also spoke with son via phone. Pt/son agreeable to ST rehab for pt and prefer [facility] as pt received therapy there several years ago. Completed FL2 and referred to [facility], admissions confirmed he has bed today and pt's son is going to sign admission paperwork at 2:30pm, pt can transfer following that. Will need [facility] for transport.  Plan: transfer to [facility] ([facility]) at [place] today. Will arrange transportation once admission paperwork complete by family this afternoon  Employment status:  Retired Nurse, adult PT Recommendations:  [place]  / Referral to community resources:  [place]  Patient/Family's Response to care:  Pt expresses appreciation for care received   Patient/Family's Understanding of and Emotional Response to Diagnosis, Current Treatment, and Prognosis:  Pt demonstrated adequate understanding of plan. Emotional response was hopeful and excited to be leaving hospital to get additional therapy.   Emotional Assessment Appearance:  Appears stated age Attitude/Demeanor/Rapport:   (engaged, pleasant ) Affect (typically observed):  Accepting, Adaptable Orientation:  Oriented to Self, Oriented to Place, Oriented to Situation Alcohol / Substance use:  Not Applicable Psych involvement (Current and /or in the community):  No (Comment)  Discharge Needs  Concerns to be addressed:  Discharge Planning Concerns Readmission within the last 30 days:  No Current discharge risk:  None Barriers to Discharge:  No Barriers Identified   Nila Nephew, LCSW 09/02/2016, 11:42 AM  [phone]

## 2016-09-02 NOTE — Progress Notes (Signed)
Pt discharged to [facility] report given to RN at facility. Pt left instable condition, with family at bedside. SRP, RN

## 2016-09-02 NOTE — Discharge Instructions (Signed)
Follow with Primary MD Prince Solian, MD and Dr. Melvyn Novas in 7 days   Get CBC, CMP, 2 view Chest X ray checked  by Primary MD or SNF MD in 5-7 days ( we routinely change or add medications that can affect your baseline labs and fluid status, therefore we recommend that you get the mentioned basic workup next visit with your PCP, your PCP may decide not to get them or add new tests based on their clinical decision)  Activity: As tolerated with Full fall precautions use walker/cane & assistance as needed  Disposition Home    Diet:   Diet Carb Modified Heart Healthy with 1.5 L fluid restriction per day  Accuchecks 4 times/day, Once in AM empty stomach and then before each meal. Log in all results and show them to your Prim.MD in 3 days. If any glucose reading is under 80 or above 300 call your Prim MD immidiately. Follow Low glucose instructions for glucose under 80 as instructed.   For Heart failure patients - Check your Weight same time everyday, if you gain over 2 pounds, or you develop in leg swelling, experience more shortness of breath or chest pain, call your Primary MD immediately. Follow Cardiac Low Salt Diet and 1.5 lit/day fluid restriction.  On your next visit with your primary care physician please Get Medicines reviewed and adjusted.  Please request your Prim.MD to go over all Hospital Tests and Procedure/Radiological results at the follow up, please get all Hospital records sent to your Prim MD by signing hospital release before you go home.  If you experience worsening of your admission symptoms, develop shortness of breath, life threatening emergency, suicidal or homicidal thoughts you must seek medical attention immediately by calling 911 or calling your MD immediately  if symptoms less severe.  You Must read complete instructions/literature along with all the possible adverse reactions/side effects for all the Medicines you take and that have been prescribed to you. Take any  new Medicines after you have completely understood and accpet all the possible adverse reactions/side effects.   Do not drive, operate heavy machinery, perform activities at heights, swimming or participation in water activities or provide baby sitting services if your were admitted for syncope or siezures until you have seen by Primary MD or a Neurologist and advised to do so again.  Do not drive when taking Pain medications.    Do not take more than prescribed Pain, Sleep and Anxiety Medications  Special Instructions: If you have smoked or chewed Tobacco  in the last 2 yrs please stop smoking, stop any regular Alcohol  and or any Recreational drug use.  Wear Seat belts while driving.   Please note  You were cared for by a hospitalist during your hospital stay. If you have any questions about your discharge medications or the care you received while you were in the hospital after you are discharged, you can call the unit and asked to speak with the hospitalist on call if the hospitalist that took care of you is not available. Once you are discharged, your primary care physician will handle any further medical issues. Please note that NO REFILLS for any discharge medications will be authorized once you are discharged, as it is imperative that you return to your primary care physician (or establish a relationship with a primary care physician if you do not have one) for your aftercare needs so that they can reassess your need for medications and monitor your lab values.

## 2016-09-02 NOTE — Discharge Summary (Addendum)
Randall Odom UOR:[id number] DOB: 03-19-1926 DOA: 08/29/2016  PCP: Prince Solian, MD  Admit date: 08/29/2016  Discharge date: 09/02/2016  Admitted From: Home   Disposition:  SNF   Recommendations for Outpatient Follow-up:   Follow up with PCP in 1-2 weeks  PCP Please obtain BMP/CBC, 2 view CXR in 1week,  (see Discharge instructions)   PCP Please follow up on the following pending results: Did a CBC, BMP, 2 view chest x-ray and CBGs closely   Home Health: PT, RN, respiratory care, social work, 8 Equipment/Devices: Home oxygen 2 L nasal cannula Consultations: Pulmonary Discharge Condition: Fair   CODE STATUS: Full  Diet Recommendation: Diet Carb Modified  Heart Healthy with 1.5 L fluid restriction per day   Chief Complaint  Patient presents with  . Shortness of Breath     Brief history of present illness from the day of admission and additional interim summary    Randall Odom a 83 y.o.malew/ history of DM2, PPM, HTN, BPH, glaucoma, DJD, depression, CHB sp PPM, chronic combined CHF, CAD and atrial fibrillation. He is HOH and his wife provides all the history. Here for progressive DOE x >weeks. Has cough w/ slight whitish sputum, no CP, no hemoptysis, no fevers or chills, Went to see his pulmonary physician Dr. Melvyn Novas after missing several appointments and was sent to the hospital for evaluation of his shortness of breath and further workup.                                                                 Hospital Course    1. Acute on chronic hypoxic respiratory failure due to underlying atypical interstitial lung process versus atypical pneumonia.Seen by pulmonary who were following, he is being placed on IV steroids along with empiric IV antibiotics and diuretics with improvement clinically, chest  x-ray largely unchanged, continue nebulizer treatments and oxygen, seems to have qualified for home oxygen, Tolerated oral steroids and oral antibiotics when on 09/01/2016, will be discharged home on steroid taper and 4 more days of oral Levaquin, patient does not want placement but wants home PT and RN which will be ordered along with home oxygen. We will be requested to follow with PCP and primary pulmonologist Dr. Melvyn Novas within a week.    2. Hypertension. On ACE inhibitor and stable.  3. History of paroxysmal atrial fibrillation. Continue on Eliquis, not on any rate controlling agents.   4. Acute on chronic systolic CHF EF 27% . Repeat echo similar to one in 2016 with similar EF and wall motion abnormality, on ACE inhibitor continue, not on beta blocker will defer this to primary cardiologist and PCP, spotted very well to IV diuresis which was done for 3 days, will be switched to hives and home dose oral Lasix, request PCP to monitor weight  and BMP along with diuretic dose closely.  5. Dyslipidemia. Continue on statin.  6. BPH. On Terazosin.   7. DM type II. Currently on glipizide along with sliding scale, while on Solu-Medrol will add Lantus for better control.   Discharge diagnosis     Principal Problem:   Acute respiratory failure with hypoxemia (HCC) Active Problems:   Diabetes mellitus (HCC)   Pacemaker   Physical deconditioning   Permanent atrial fibrillation (HCC)   Complete heart block (HCC)   Essential hypertension   Dyspnea   Chronic anticoagulation   Abnormal CT of the chest   Acute and chronic respiratory failure with hypoxia ([place])   Community acquired pneumonia    Discharge instructions    Discharge Instructions    Discharge instructions    Complete by:  As directed    Follow with Primary MD Prince Solian, MD and Dr. Melvyn Novas in 7 days   Get CBC, CMP, 2 view Chest X ray checked  by Primary MD or SNF MD in 5-7 days ( we routinely change or add medications  that can affect your baseline labs and fluid status, therefore we recommend that you get the mentioned basic workup next visit with your PCP, your PCP may decide not to get them or add new tests based on their clinical decision)  Activity: As tolerated with Full fall precautions use walker/cane & assistance as needed  Disposition Home    Diet:   Diet Carb Modified Heart Healthy with 1.5 L fluid restriction per day  Accuchecks 4 times/day, Once in AM empty stomach and then before each meal. Log in all results and show them to your Prim.MD in 3 days. If any glucose reading is under 80 or above 300 call your Prim MD immidiately. Follow Low glucose instructions for glucose under 80 as instructed.   For Heart failure patients - Check your Weight same time everyday, if you gain over 2 pounds, or you develop in leg swelling, experience more shortness of breath or chest pain, call your Primary MD immediately. Follow Cardiac Low Salt Diet and 1.5 lit/day fluid restriction.  On your next visit with your primary care physician please Get Medicines reviewed and adjusted.  Please request your Prim.MD to go over all Hospital Tests and Procedure/Radiological results at the follow up, please get all Hospital records sent to your Prim MD by signing hospital release before you go home.  If you experience worsening of your admission symptoms, develop shortness of breath, life threatening emergency, suicidal or homicidal thoughts you must seek medical attention immediately by calling 911 or calling your MD immediately  if symptoms less severe.  You Must read complete instructions/literature along with all the possible adverse reactions/side effects for all the Medicines you take and that have been prescribed to you. Take any new Medicines after you have completely understood and accpet all the possible adverse reactions/side effects.   Do not drive, operate heavy machinery, perform activities at heights,  swimming or participation in water activities or provide baby sitting services if your were admitted for syncope or siezures until you have seen by Primary MD or a Neurologist and advised to do so again.  Do not drive when taking Pain medications.    Do not take more than prescribed Pain, Sleep and Anxiety Medications  Special Instructions: If you have smoked or chewed Tobacco  in the last 2 yrs please stop smoking, stop any regular Alcohol  and or any Recreational drug use.  Wear Seat belts  while driving.   Please note  You were cared for by a hospitalist during your hospital stay. If you have any questions about your discharge medications or the care you received while you were in the hospital after you are discharged, you can call the unit and asked to speak with the hospitalist on call if the hospitalist that took care of you is not available. Once you are discharged, your primary care physician will handle any further medical issues. Please note that NO REFILLS for any discharge medications will be authorized once you are discharged, as it is imperative that you return to your primary care physician (or establish a relationship with a primary care physician if you do not have one) for your aftercare needs so that they can reassess your need for medications and monitor your lab values.   Increase activity slowly    Complete by:  As directed       Discharge Medications   Allergies as of 09/02/2016      Reactions   Spironolactone Diarrhea, Nausea And Vomiting      Medication List    TAKE these medications   apixaban 5 MG Tabs tablet Commonly known as:  ELIQUIS TAKE 1 TABLET (5 MG TOTAL) BY MOUTH 2 (TWO) TIMES DAILY. What changed:  how much to take  how to take this  when to take this  additional instructions   fluticasone 50 MCG/ACT nasal spray Commonly known as:  FLONASE Place 2 sprays into the nose daily as needed for allergies. What changed:  how to take this     furosemide 40 MG tablet Commonly known as:  LASIX Take 1 tablet (40 mg total) by mouth 2 (two) times daily. What changed:  how much to take  when to take this   glipiZIDE 5 MG tablet Commonly known as:  GLUCOTROL Take 5 mg by mouth daily before breakfast.   insulin aspart 100 UNIT/ML injection Commonly known as:  NOVOLOG Before each meal 3 times a day, 140-199 - 2 units, 200-250 - 4 units, 251-299 - 6 units,  300-349 - 8 units,  350 or above 10 units. Insulin PEN if approved, provide syringes and needles if needed.   levofloxacin 750 MG tablet Commonly known as:  LEVAQUIN Take 1 tablet (750 mg total) by mouth daily. Start taking on:  09/03/2016   lisinopril 5 MG tablet Commonly known as:  PRINIVIL,ZESTRIL Take 5 mg by mouth daily.   multivitamin with minerals Tabs tablet Take 1 tablet by mouth daily.   predniSONE 5 MG tablet Commonly known as:  DELTASONE Label  & dispense according to the schedule below. Take 8 Pills PO for 2 days, 6 Pills PO for 2 days, 4 Pills PO for 3 days, 2 Pills PO for 3 days, 1 Pills PO for 3 days, 1/2 Pill  PO for 3 days then STOP. Total 55 pills.   simvastatin 40 MG tablet Commonly known as:  ZOCOR Take 1 tablet (40 mg total) by mouth at bedtime.   terazosin 5 MG capsule Commonly known as:  HYTRIN Take 1 capsule (5 mg total) by mouth at bedtime.   TRAVATAN Z 0.004 % Soln ophthalmic solution Generic drug:  Travoprost (BAK Free) Place 1 drop into both eyes at bedtime.   Vitamin D 2000 units tablet Take 2,000 Units by mouth daily.            Durable Medical Equipment        Start     Ordered  09/02/16 1054  For home use only DME oxygen  Once    Question Answer Comment  Mode or (Route) Nasal cannula   Liters per Minute 2   Frequency Continuous (stationary and portable oxygen unit needed)   Oxygen conserving device Yes   Oxygen delivery system Gas      09/02/16 1055       Contact information for follow-up providers    Avva,  Ravisankar, MD. Schedule an appointment as soon as possible for a visit in 1 week(s).   Specialty:  Internal Medicine Contact information: [place] [address] [phone]        Tanda Rockers, MD. Schedule an appointment as soon as possible for a visit in 1 week(s).   Specialty:  Pulmonary Disease Contact information: [address] [place] [address] [phone]            Contact information for after-discharge care    Destination    [facility] .   Specialty:  [place] information: [place] [phone]                  Major procedures and Radiology Reports - PLEASE review detailed and final reports thoroughly  -     TTE. - Left ventricle: The cavity size was normal. There was moderateconcentric hypertrophy. Systolic function was mildly tomoderately reduced. The estimated ejection fraction was in therange of 40% to 45%. Akinesis of the mid-apical anteroseptal,inferoseptal, and apical myocardium. Hypokinesis of the anteriormyocardium. The study is not technically sufficient to allowevaluation of LV diastolic function. Doppler parameters areconsistent with indeterminate ventricular filling pressure. - Aortic valve: There was mild regurgitation. - Mitral valve: Transvalvular velocity was within the normal range.There was no evidence for stenosis. There was trivialregurgitation. - Left atrium: The atrium was mildly dilated. - Right ventricle: The cavity size was normal. Wall thickness wasnormal. Systolic function was normal. - Tricuspid valve: There was mild regurgitation. - Pulmonary arteries: Systolic pressure was within the normalrange.     Dg Chest 2 View  Result Date: 09/02/2016 CLINICAL DATA:  Cough, weakness, and shortness of breath. History of atrial fibrillation, CHF, coronary artery disease, acute respiratory failure. EXAM:  CHEST  2 VIEW COMPARISON:  Chest x-ray of August 31, 2016 FINDINGS: The lungs are mildly hyperinflated. Interstitial density in the right mid and lower lung is slightly less conspicuous today. There is persistent infiltrate at the left lung base. There small bilateral pleural effusions. The heart is top-normal in size. The pulmonary vascularity is not engorged. There is mild tortuosity of the descending thoracic aorta. There is calcification in the wall of the aortic arch. The ICD is in stable position. IMPRESSION: Slight interval improvement in infiltrate in the right mid and lower lung. Persistent left basilar atelectasis or pneumonia. No overt CHF. Underlying COPD. Thoracic aortic atherosclerosis. Electronically Signed   By: David  Martinique M.D.   On: 09/02/2016 11:57   Dg Chest 2 View  Result Date: 08/31/2016 CLINICAL DATA:  Pulmonary infiltrate.  Follow-up. EXAM: CHEST  2 VIEW COMPARISON:  Chest CT August 29, 2016.  Chest x-ray August 29, 2016. FINDINGS: No pneumothorax. Stable pacemaker. Persistent infiltrate in the right mid and lower lungs as well as the left base. No other interval changes. IMPRESSION: No significant change in multifocal infiltrate. Recommend follow-up to resolution. Electronically Signed   By: Dorise Bullion III M.D   On: 08/31/2016 11:08   Dg Chest 2 View  Result Date: 08/29/2016 CLINICAL DATA:  Patient here with complaints of low oxygen level while at pulmonologist visit for follow up. Increased SOB x3 weeks. Denies chest pain. Hx chf, a-fib, htn, ex-smoker. EXAM: CHEST  2 VIEW COMPARISON:  07/11/2014 FINDINGS: Patient has right-sided transvenous pacemaker with leads to the right atrium and right ventricle. Shallow lung inflation. Heart is mildly enlarged. There are patchy infiltrates involving the right lung and the left lower lobe, favoring infectious process. IMPRESSION: Interval development of bilateral airspace filling opacities, likely infectious in origin. Asymmetric pulmonary  edema could have a similar appearance. Electronically Signed   By: Nolon Nations M.D.   On: 08/29/2016 18:20   Ct Chest Wo Contrast  Result Date: 08/29/2016 CLINICAL DATA:  Cough and dyspnea EXAM: CT CHEST WITHOUT CONTRAST TECHNIQUE: Multidetector CT imaging of the chest was performed following the standard protocol without IV contrast. COMPARISON:  Chest CT 03/27/2015 FINDINGS: Cardiovascular: The heart is enlarged. There pacemaker leads in the right atrium, right ventricle and coronary sinus. No pericardial effusion. There are coronary artery atherosclerotic calcifications. There is extensive atherosclerotic calcification of the aorta. Main pulmonary artery is mildly enlarged. Mediastinum/Nodes: 12 mm level 4R lymph node. Other mediastinal nodes measure up to 13 mm. No axillary adenopathy. Normal thyroid. Lungs/Pleura: There is extensive consolidation throughout the right lung. There is a medium-sized right pleural effusion. There are locules of air within the effusion. There are additional scattered areas of consolidation in the left lung. Upper Abdomen: There is a 5 mm calculus at the right renal hilum. The partially visualized left renal pelvis is dilated. Musculoskeletal: No chest wall mass or suspicious bone lesions identified. IMPRESSION: 1. Multiple large areas of consolidation within both lungs, worse on the right. Differential diagnosis is broad, including multifocal pneumonia (favored), granulomatous disease such as sarcoidosis or inhalation/hypersensitivity disease such as pneumoconiosis. Correlation with laboratory values and bronchoscopy/bronchoalveolar lavage may be helpful. 2.  Aortic Atherosclerosis (ICD10-I70.0). Electronically Signed   By: Ulyses Jarred M.D.   On: 08/29/2016 20:56    Micro Results     Recent Results (from the past 240 hour(s))  Blood culture (routine x 2)     Status: None (Preliminary result)   Collection Time: 08/29/16  6:51 PM  Result Value Ref Range Status    Specimen Description BLOOD LEFT ANTECUBITAL  Final   Special Requests   Final    BOTTLES DRAWN AEROBIC AND ANAEROBIC Blood Culture adequate volume   Culture   Final    NO GROWTH 4 DAYS Performed at [facility], [place] [address], [place], [place] [address]    Report Status PENDING  Incomplete  Blood culture (routine x 2)     Status: None (Preliminary result)   Collection Time: 08/29/16  6:53 PM  Result Value Ref Range Status   Specimen Description BLOOD RIGHT ANTECUBITAL  Final   Special Requests   Final    BOTTLES DRAWN AEROBIC AND ANAEROBIC Blood Culture adequate volume   Culture   Final    NO GROWTH 4 DAYS Performed at [facility], [place] [address] [place], [place] [address]    Report Status PENDING  Incomplete    Today   Subjective    Tarquin Welcher today has no headache,no chest abdominal pain,no new weakness tingling or numbness, feels much better wants to go home today.     Objective   Blood pressure 133/60, pulse 73, temperature 97.5 F (36.4 C), temperature source Oral, resp. rate 20, height 5\' 9"  (  1.753 m), weight 89.8 kg (197 lb 15.6 oz), SpO2 92 %.   Intake/Output Summary (Last 24 hours) at 09/02/16 1300 Last data filed at 09/02/16 0552  Gross per 24 hour  Intake              600 ml  Output              650 ml  Net              -50 ml    Exam Awake Alert, Oriented x 3, No new F.N deficits, Normal affect [place].AT,PERRAL Supple Neck,No JVD, No cervical lymphadenopathy appriciated.  Symmetrical Chest wall movement, Good air movement bilaterally, few rales RRR,No Gallops,Rubs or new Murmurs, No Parasternal Heave +ve B.Sounds, Abd Soft, Non tender, No organomegaly appriciated, No rebound -guarding or rigidity. No Cyanosis, Clubbing or edema, No new Rash or bruise   Data Review   CBC w Diff:  Lab Results  Component Value Date   WBC 15.1 (H) 08/30/2016   HGB 12.6 (L) 08/30/2016   HCT 38.9 (L) 08/30/2016   PLT 304 08/30/2016   LYMPHOPCT 16  08/29/2016   MONOPCT 16 08/29/2016   EOSPCT 3 08/29/2016   BASOPCT 0 08/29/2016    CMP:  Lab Results  Component Value Date   NA 141 09/01/2016   K 4.5 09/01/2016   CL 107 09/01/2016   CO2 26 09/01/2016   BUN 35 (H) 09/01/2016   CREATININE 1.07 09/01/2016   PROT 6.9 08/29/2016   ALBUMIN 2.9 (L) 08/29/2016   BILITOT 0.6 08/29/2016   ALKPHOS 83 08/29/2016   AST 27 08/29/2016   ALT 26 08/29/2016  .   Total Time in preparing paper work, data evaluation and todays exam - 35 minutes  Lala Lund M.D on 09/02/2016 at 1:00 PM  [facility]   Office  [phone]

## 2016-09-02 NOTE — Progress Notes (Addendum)
[facility]  Brief: Former smoker, patient of Dr. Melvyn Novas who was admitted on 6/21 with acute respiratory failure with hypoxemia in the setting of bilateral infiltrates.  Here he has been receiving lisinopril, levaquin.    S: he can't really tell if he is feeling better or not; says that he is still coughing up mucus, denies fevers or chills.  O: Vitals:   09/01/16 0520 09/01/16 1515 09/01/16 2210 09/02/16 0548  BP: 122/65 (!) 109/47 131/64 133/60  Pulse: 65 77 76 73  Resp: 18 19 20 20   Temp: 98.6 F (37 C) 97.5 F (36.4 C) 97.4 F (36.3 C) 97.5 F (36.4 C)  TempSrc: Oral Oral Oral Oral  SpO2: 98% 95% 96% 92%  Weight: 208 lb 5.4 oz (94.5 kg)   197 lb 15.6 oz (89.8 kg)  Height:       2L [place]  Gen: elderly, chronically ill appearing HENT: OP clear, TM's clear, neck supple PULM: Few upper lobe wheezes, crackles bases B, normal percussion CV: Irreg irreg, no mgr, trace edema GI: BS+, soft, nontender Derm: no cyanosis or rash Psyche: normal mood and affect  CBC    Component Value Date/Time   WBC 15.1 (H) 08/30/2016 0531   RBC 4.10 (L) 08/30/2016 0531   HGB 12.6 (L) 08/30/2016 0531   HCT 38.9 (L) 08/30/2016 0531   PLT 304 08/30/2016 0531   MCV 94.9 08/30/2016 0531   MCH 30.7 08/30/2016 0531   MCHC 32.4 08/30/2016 0531   RDW 13.1 08/30/2016 0531   LYMPHSABS 2.9 08/29/2016 1737   MONOABS 2.9 (H) 08/29/2016 1737   EOSABS 0.6 08/29/2016 1737   BASOSABS 0.1 08/29/2016 1737   BMET    Component Value Date/Time   NA 141 09/01/2016 0511   K 4.5 09/01/2016 0511   CL 107 09/01/2016 0511   CO2 26 09/01/2016 0511   GLUCOSE 252 (H) 09/01/2016 0511   BUN 35 (H) 09/01/2016 0511   CREATININE 1.07 09/01/2016 0511   CALCIUM 9.0 09/01/2016 0511   GFRNONAA 60 (L) 09/01/2016 0511   GFRAA >60 09/01/2016 0511   Records from my partners reviewed, he has been followed for a few years for a cavitary RLL lesion which hasn't changed in size over the years.    CXR and CT chest images reviewed,  the CT chest shows multi-lobar airspace disease worse on the R with a R pleural effusion and likely RLL cavitary pneumonia  Sputum AFB results?  Impression/Plan:  Acute respiratory failure with hypoxemia with multi-lobar infiltrates: the most likely explanation is severe community acquired pneumonia but the symptoms are a bit unusual given the insidious onset.  He has been receiving steroids and antibiotics here and its hard for me to know if he is better or not because he is a poor historian.  If he is discharged today, would continue antibiotics for at least 14 days as he appears to have necrotic pneumonia in the RLL.  He will need very careful monitoring with outpatient pulmonary follow up within the next 7-14 days.  It's not clear to me that this is a non-infectious inflammatory process.  OK to taper off prednisone.  Summary: Follow up sputum AFB Follow up quantiferon GOLD testing Continue levaquin for 14 day course Wean off prednisone: Take 40mg  po daily for 3 days, then take 30mg  po daily for 3 days, then take 20mg  po daily for two days, then take 10mg  po daily for 2 days Needs set up for home O2 Has follow up with  Dr. Melvyn Novas July 9 at 12:00 Will repeat CXR now to see if there is improvement in multi-lobar infiltrates Consider bronch as outpatient if no improvement or sooner if worse  Will sign off  Roselie Awkward, MD [place] [facility] Pager: [phone] Cell: [phone] After 3pm or if no response, call [phone]

## 2016-09-02 NOTE — Evaluation (Signed)
Physical Therapy Evaluation Patient Details Name: Randall Odom MRN: [id number] DOB: 09/09/1926 Today's Date: 09/02/2016   History of Present Illness  83 y.o. male w/ history of DM2, PPM, HTN, BPH, glaucoma, DJD, depression, CHB sp PPM, chronic combined CHF, CAD and atrial fibrillation and admitted for SOB and low oxygen saturations  Clinical Impression  Pt with unsafe gait pattern with cane.  Pt concerned about SNF vs going home.  PT spent time educating pt on role of therapy at SNF and verbalizing understanding.  Con't to recommend SNF.    Follow Up Recommendations SNF;Supervision/Assistance - 24 hour    Equipment Recommendations  Rolling walker with 5" wheels    Recommendations for Other Services       Precautions / Restrictions Precautions Precautions: Fall Precaution Comments: monitor sats      Mobility  Bed Mobility Overal bed mobility: Needs Assistance Bed Mobility: Supine to Sit     Supine to sit: Min assist;HOB elevated     General bed mobility comments: assistance for trunk  Transfers Overall transfer level: Needs assistance Equipment used: Straight cane Transfers: Sit to/from Stand Sit to Stand: Min guard         General transfer comment: cues for hand placement  Ambulation/Gait Ambulation/Gait assistance: Min assist;Mod assist Ambulation Distance (Feet): 65 Feet Assistive device: Straight cane (o2 tank) Gait Pattern/deviations: Step-through pattern;Drifts right/left     General Gait Details: Pt ambulated with cane and pushing o2 tank.  Pt with poor safety with this technique, but reports this is how he does it.  Pt refused use of RW. Pt unsteady with gait and fatigued quickly  Stairs            Wheelchair Mobility    Modified Rankin (Stroke Patients Only)       Balance                                             Pertinent Vitals/Pain Pain Assessment: No/denies pain    Home Living                       Prior Function                 Hand Dominance        Extremity/Trunk Assessment                Communication      Cognition Arousal/Alertness: Awake/alert Behavior During Therapy: WFL for tasks assessed/performed Overall Cognitive Status: Within Functional Limits for tasks assessed                                        General Comments General comments (skin integrity, edema, etc.): o2 87% after gait on 3 L/min. Returned to 90s after several minutes with cues for pursed lip breathing    Exercises     Assessment/Plan    PT Assessment    PT Problem List         PT Treatment Interventions      PT Goals (Current goals can be found in the Care Plan section)  Acute Rehab PT Goals Patient Stated Goal: [place] PT Goal Formulation: With patient/family Time For Goal Achievement: 09/06/16 Potential to Achieve Goals: Good  Frequency Min 3X/week   Barriers to discharge        Co-evaluation               AM-PAC PT "6 Clicks" Daily Activity  Outcome Measure Difficulty turning over in bed (including adjusting bedclothes, sheets and blankets)?: None Difficulty moving from lying on back to sitting on the side of the bed? : Total Difficulty sitting down on and standing up from a chair with arms (e.g., wheelchair, bedside commode, etc,.)?: A Little Help needed moving to and from a bed to chair (including a wheelchair)?: A Little Help needed walking in hospital room?: A Lot Help needed climbing 3-5 steps with a railing? : A Lot 6 Click Score: 15    End of Session Equipment Utilized During Treatment: Gait belt;Oxygen Activity Tolerance: Patient limited by fatigue Patient left: in chair;with call bell/phone within reach;with chair alarm set;with family/visitor present   PT Visit Diagnosis: Difficulty in walking, not elsewhere classified (R26.2)    Time: 6378-5885 PT Time Calculation (min) (ACUTE  ONLY): 27 min   Charges:     PT Treatments $Gait Training: 8-22 mins $Therapeutic Activity: 8-22 mins   PT G Codes:        Randall Odom, [place] Pager [phone] 09/02/2016   Randall Odom 09/02/2016, 12:50 PM

## 2016-09-02 NOTE — NC FL2 (Signed)
[place] LEVEL OF CARE SCREENING TOOL     IDENTIFICATION  Patient Name: Randall Odom Birthdate: [birth year] Sex: male Admission Date (Current Location): 08/29/2016  [facility] and [place] Number:  Herbalist and Address:  [facility],  [place] [address], [place]      Provider Number: [id number]  Attending Physician Name and Address:  Thurnell Lose, MD  Relative Name and Phone Number:       Current Level of Care: Hospital Recommended Level of Care: [place] Prior Approval Number:    Date Approved/Denied:   PASRR Number: [id number]  Discharge Plan: SNF    Current Diagnoses: Patient Active Problem List   Diagnosis Date Noted  . Community acquired pneumonia   . Pulmonary infiltrate 08/30/2016  . Acute respiratory failure with hypoxemia ([place]) 08/29/2016  . Abnormal CT of the chest 08/29/2016  . Acute and chronic respiratory failure with hypoxia ([place]) 08/29/2016  . Chronic anticoagulation 07/10/2016  . Abnormal CT scan, chest 08/13/2013  . Hyperlipidemia 08/09/2013  . Essential hypertension 08/09/2013  . Dyspnea 08/09/2013  . Chronic combined systolic and diastolic heart failure ([place]) 08/09/2013  . Permanent atrial fibrillation ([place]) 02/22/2013  . Complete heart block ([place]) 02/22/2013  . Occlusion and stenosis of carotid artery without mention of cerebral infarction 06/02/2012  . Physical deconditioning 05/18/2012  . C. difficile colitis 05/13/2012  . Dehydration 05/13/2012  . Diarrhea 05/12/2012  . Acute renal failure ([place]) 05/12/2012  . Hypokalemia 05/12/2012  . Syncope and collapse 05/06/2011  . Diabetes mellitus ([place]) 05/06/2011  . Pacemaker 05/06/2011  . CAD (coronary artery disease) 05/06/2011    Orientation RESPIRATION BLADDER Height & Weight     Self, Situation, Place  O2 (2L) Continent Weight: 197 lb 15.6 oz (89.8 kg) Height:  5\' 9"  (175.3 cm)  BEHAVIORAL SYMPTOMS/MOOD NEUROLOGICAL BOWEL  NUTRITION STATUS      Continent Diet (carb modified, thin fluid consistency)  AMBULATORY STATUS COMMUNICATION OF NEEDS Skin   Limited Assist Verbally Normal                       Personal Care Assistance Level of Assistance  Bathing, Feeding, Dressing Bathing Assistance: Limited assistance Feeding assistance: Independent Dressing Assistance: Limited assistance     Functional Limitations Info  Sight, Hearing, Speech Sight Info: Adequate Hearing Info: Adequate Speech Info: Adequate    SPECIAL CARE FACTORS FREQUENCY  PT (By licensed PT), OT (By licensed OT)     PT Frequency: 5x OT Frequency: 5x            Contractures Contractures Info: Not present    Additional Factors Info  Code Status, Allergies Code Status Info: Full Allergies Info: Spironolactone           Current Medications (09/02/2016):  This is the current hospital active medication list Current Facility-Administered Medications  Medication Dose Route Frequency Provider Last Rate Last Dose  . acetaminophen (TYLENOL) tablet 650 mg  650 mg Oral Q6H PRN Roney Jaffe, MD      . apixaban Arne [place]) tablet 5 mg  5 mg Oral BID Roney Jaffe, MD   5 mg at 09/02/16 0826  . cholecalciferol (VITAMIN D) tablet 2,000 Units  2,000 Units Oral Daily Roney Jaffe, MD   2,000 Units at 09/02/16 [phone]  . fluticasone (FLONASE) 50 MCG/ACT nasal spray 2 spray  2 spray Each Nare Daily PRN Roney Jaffe, MD      . furosemide (LASIX) injection 40  mg  40 mg Intravenous Once Thurnell Lose, MD      . glipiZIDE (GLUCOTROL) tablet 5 mg  5 mg Oral QAC breakfast Roney Jaffe, MD   5 mg at 09/02/16 0825  . insulin aspart (novoLOG) injection 0-5 Units  0-5 Units Subcutaneous QHS Thurnell Lose, MD   2 Units at 09/01/16 2305  . insulin aspart (novoLOG) injection 0-9 Units  0-9 Units Subcutaneous TID WC Thurnell Lose, MD   1 Units at 09/02/16 [phone]  . insulin glargine (LANTUS) injection 20 Units  20 Units Subcutaneous  Daily Thurnell Lose, MD   20 Units at 09/02/16 [phone]  . latanoprost (XALATAN) 0.005 % ophthalmic solution 1 drop  1 drop Both Eyes QHS Roney Jaffe, MD   1 drop at 09/01/16 2305  . levofloxacin (LEVAQUIN) tablet 750 mg  750 mg Oral Daily Minda Ditto, RPH   750 mg at 09/02/16 0826  . lisinopril (PRINIVIL,ZESTRIL) tablet 5 mg  5 mg Oral Daily Roney Jaffe, MD   5 mg at 09/02/16 0825  . ondansetron (ZOFRAN) injection 4 mg  4 mg Intravenous Q6H PRN Roney Jaffe, MD      . polyethylene glycol (MIRALAX / GLYCOLAX) packet 17 g  17 g Oral Daily PRN Roney Jaffe, MD      . predniSONE (DELTASONE) tablet 50 mg  50 mg Oral Q breakfast Thurnell Lose, MD   50 mg at 09/02/16 0825  . simvastatin (ZOCOR) tablet 40 mg  40 mg Oral QHS Roney Jaffe, MD   40 mg at 09/01/16 2304  . sodium chloride flush (NS) 0.9 % injection 3 mL  3 mL Intravenous Q12H Roney Jaffe, MD   3 mL at 09/01/16 2308  . terazosin (HYTRIN) capsule 5 mg  5 mg Oral QHS Roney Jaffe, MD   5 mg at 09/01/16 2306     Discharge Medications: Please see discharge summary for a list of discharge medications.  Relevant Imaging Results:  Relevant Lab Results:   Additional Information SS# [id number]  Nila Nephew, LCSW

## 2016-09-03 LAB — CULTURE, BLOOD (ROUTINE X 2)
CULTURE: NO GROWTH
Culture: NO GROWTH
SPECIAL REQUESTS: ADEQUATE
Special Requests: ADEQUATE

## 2016-09-04 LAB — HYPERSENSITIVITY PNEUMONITIS
A. FUMIGATUS #1 ABS: NEGATIVE
A. Pullulans Abs: NEGATIVE
MICROPOLYSPORA FAENI IGG: NEGATIVE
Pigeon Serum Abs: NEGATIVE
THERMOACT. SACCHARII: NEGATIVE
Thermoactinomyces vulgaris, IgG: NEGATIVE

## 2016-09-04 LAB — ACID FAST SMEAR (AFB, MYCOBACTERIA): Acid Fast Smear: NEGATIVE

## 2016-09-05 LAB — QUANTIFERON IN TUBE
QUANTIFERON MITOGEN VALUE: 0.18 IU/mL
QUANTIFERON TB AG VALUE: 0.03 [IU]/mL
QUANTIFERON TB GOLD: UNDETERMINED
Quantiferon Nil Value: 0.05 IU/mL

## 2016-09-05 LAB — QUANTIFERON TB GOLD ASSAY (BLOOD)

## 2016-09-10 ENCOUNTER — Encounter: Payer: Self-pay | Admitting: Internal Medicine

## 2016-09-10 ENCOUNTER — Ambulatory Visit (INDEPENDENT_AMBULATORY_CARE_PROVIDER_SITE_OTHER)
Admission: RE | Admit: 2016-09-10 | Discharge: 2016-09-10 | Disposition: A | Discharge status: Home / Self Care | Payer: Medicare Other | Source: Ambulatory Visit | Attending: Internal Medicine | Admitting: Internal Medicine

## 2016-09-10 ENCOUNTER — Ambulatory Visit (INDEPENDENT_AMBULATORY_CARE_PROVIDER_SITE_OTHER): Payer: Medicare Other | Admitting: Internal Medicine

## 2016-09-10 ENCOUNTER — Other Ambulatory Visit (INDEPENDENT_AMBULATORY_CARE_PROVIDER_SITE_OTHER): Payer: Medicare Other

## 2016-09-10 VITALS — BP 88/52 | HR 77

## 2016-09-10 DIAGNOSIS — R918 Other nonspecific abnormal finding of lung field: Secondary | ICD-10-CM | POA: Diagnosis not present

## 2016-09-10 DIAGNOSIS — J189 Pneumonia, unspecified organism: Secondary | ICD-10-CM | POA: Diagnosis not present

## 2016-09-10 DIAGNOSIS — R0609 Other forms of dyspnea: Secondary | ICD-10-CM

## 2016-09-10 DIAGNOSIS — J9611 Chronic respiratory failure with hypoxia: Secondary | ICD-10-CM | POA: Diagnosis not present

## 2016-09-10 DIAGNOSIS — N179 Acute kidney failure, unspecified: Secondary | ICD-10-CM

## 2016-09-10 LAB — BASIC METABOLIC PANEL
BUN: 90 mg/dL (ref 6–23)
CALCIUM: 10 mg/dL (ref 8.4–10.5)
CHLORIDE: 95 meq/L — AB (ref 96–112)
CO2: 28 meq/L (ref 19–32)
CREATININE: 3.31 mg/dL — AB (ref 0.40–1.50)
GFR: 18.83 mL/min — ABNORMAL LOW (ref >60.00)
GLUCOSE: 218 mg/dL — AB (ref 70–99)
Potassium: 4.3 mEq/L (ref 3.5–5.1)
SODIUM: 134 meq/L — AB (ref 135–145)

## 2016-09-10 LAB — CBC WITH DIFFERENTIAL/PLATELET
Basophils Absolute: 0.2 10*3/uL — ABNORMAL HIGH (ref 0.0–0.1)
Basophils Relative: 0.6 % (ref 0.0–3.0)
EOS ABS: 0 10*3/uL (ref 0.0–0.7)
EOS PCT: 0.1 % (ref 0.0–5.0)
HEMATOCRIT: 45 % (ref 39.0–52.0)
HEMOGLOBIN: 14.9 g/dL (ref 13.0–17.0)
LYMPHS PCT: 8.5 % — AB (ref 12.0–46.0)
Lymphs Abs: 2.7 10*3/uL (ref 0.7–4.0)
MCHC: 33 g/dL (ref 30.0–36.0)
MCV: 92.6 fl (ref 78.0–100.0)
Monocytes Absolute: 3.2 10*3/uL — ABNORMAL HIGH (ref 0.1–1.0)
Monocytes Relative: 10 % (ref 3.0–12.0)
NEUTROS ABS: 25.7 10*3/uL — AB (ref 1.4–7.7)
Neutrophils Relative %: 80.8 % — ABNORMAL HIGH (ref 43.0–77.0)
PLATELETS: 214 10*3/uL (ref 150.0–400.0)
RBC: 4.86 Mil/uL (ref 4.22–5.81)
RDW: 13.5 % (ref 11.5–15.5)
WBC: 31.8 10*3/uL — AB (ref 4.0–10.5)

## 2016-09-10 LAB — HEPATIC FUNCTION PANEL
ALBUMIN: 3.2 g/dL — AB (ref 3.5–5.2)
ALK PHOS: 68 U/L (ref 39–117)
ALT: 32 U/L (ref 0–53)
AST: 22 U/L (ref 0–37)
Bilirubin, Direct: 0.2 mg/dL (ref 0.0–0.3)
TOTAL PROTEIN: 6.3 g/dL (ref 6.0–8.3)
Total Bilirubin: 0.9 mg/dL (ref 0.2–1.2)

## 2016-09-10 LAB — BRAIN NATRIURETIC PEPTIDE: Pro B Natriuretic peptide (BNP): 30 pg/mL (ref 0.0–100.0)

## 2016-09-10 LAB — SEDIMENTATION RATE: SED RATE: 14 mm/h (ref 0–20)

## 2016-09-10 LAB — TSH: TSH: 2.39 u[IU]/mL (ref 0.35–4.50)

## 2016-09-10 NOTE — Progress Notes (Signed)
Spoke with the pt's son and notified of recs per MW and he verbalized understanding Called [facility] and spoke with Stephenie Acres, pt's nurse and notified of these recs and also confirmed that she got our instruction to d/c the lisinopril

## 2016-09-10 NOTE — Progress Notes (Signed)
Subjective:    Patient ID: Randall Odom, male    DOB: Aug 27, 1926    MRN: [id number]    Brief patient profile:  89 yowm quit in 1970 at baseline able to walk a mile s stopping x nl pace s stopping then indolent onset starting March 2015 noted worse sob  to point where sob x across the house and barely makes it back up from [place] suggesting R superior segment mass referred to pulmonary clinic 08/13/2013 by Dr Randall Odom   History of Present Illness  08/13/2013 1st Randall Odom Pulmonary office visit/ Randall Odom  Chief Complaint  Patient presents with  . Pulmonary Consult    Referred per Dr Randall Odom for eval of abnormal ct chest. Pt c/o SOB and cough for the past several months. Cough is prod with large amounts of white sputum.  He states he gets SOB just walking from room to room.   started having cough after eating and when get up in am, some yellow  Can't sleep on back x years, ok on side rec Augmentin 875 mg take one pill twice daily  X 14 days - take at breakfast and supper with large glass of water.  It would help reduce the usual side effects (diarrhea and yeast infections) if you ate cultured yogurt at lunch.    08/27/2013 f/u ov/Randall Odom re:  Chief Complaint  Patient presents with  . Follow-up    Pt states that breahting has been doing well since last OV. CXR today.    Not limited by breathing from desired activities-  Including now walking back from mailbox ok  rec If the cough starts to bother you,  We may need you to try a different blood pressure pill  - CT chest 09/21/2013  Persistent irregular cavitary lesion in the right lower lobe. This is better evaluated on the current examination due to the lack of artifact in this area, and its appearance is concerning for cavitary neoplasm. > rec PET 09/30/13 low uptake c/w scar which fits clinically  - Repeat CT chest 03/25/2014 > no change> 03/27/2015 no change > recheck in one year placed in tickle file for 03/26/16 > did not show for f/u CT        08/29/2016  f/u ov/Randall Odom re:  New sob  Chief Complaint  Patient presents with  . Pulmonary Consult    Pt last seen in 2015.  He is here to f/u on lung nodule. He reports increased DOE "for quite a while" can no longer walk to his mailbox. He also c/o cough- esp worse when he lies down- prod with clear sputum.    baseline = very sedentary / goes out for brfast 4 x weekly, gets off at curb due to breathing but still able to do  mb and back until about a month prior to [place] with increased cough rx mucinex clear mucus but no  Fever or purulent sputum - sleeps now at > 30 degrees where was able to lie almost flat rec Admit   Admit date: 08/29/2016  Discharge date: 09/02/2016  Admitted From: Home   Disposition:  SNF   Recommendations for Outpatient Follow-up:   Follow up with PCP in 1-2 weeks  PCP Please obtain BMP/CBC, 2 view CXR in 1week,  (see Discharge instructions)   PCP Please follow up on the following pending results: Did a CBC, BMP, 2 view chest x-ray and CBGs closely   Home Health: PT, RN, respiratory care, social  work, 8 Equipment/Devices: Home oxygen 2 L nasal cannula Consultations: Pulmonary Discharge Condition: Fair   CODE STATUS: Full  Diet Recommendation: Diet Carb Modified  Heart Healthy with 1.5 L fluid restriction per day      Chief Complaint  Patient presents with  . Shortness of Breath     Brief history of present illness from the day of admission and additional interim summary    Randall Odom a 83 y.o.malew/ history of DM2, PPM, HTN, BPH, glaucoma, DJD, depression, CHB sp PPM, chronic combined CHF, CAD and atrial fibrillation. He is HOH and his wife provides all the history. Here for progressive DOE x >weeks. Has cough w/ slight whitish sputum, no CP, no hemoptysis, no fevers or chills, Went to see his pulmonary physician Dr. Melvyn Odom after missing several appointments and was sent to the hospital for evaluation of his shortness of  breath and further workup.                                                                 Hospital Course    1. Acute on chronic hypoxic respiratory failure due to underlying atypical interstitial lung process versus atypical pneumonia.Seen by pulmonary who were following, he is being placed on IV steroids along with empiric IV antibiotics and diuretics with improvement clinically, chest x-ray largely unchanged, continue nebulizer treatments and oxygen, seems to have qualified for home oxygen, Tolerated oral steroids and oral antibiotics when on 09/01/2016, will be discharged home on steroid taper and 4 more days of oral Levaquin, patient does not want placement but wants home PT and RN which will be ordered along with home oxygen. We will be requested to follow with PCP and primary pulmonologist Dr. Melvyn Odom within a week.    2. Hypertension. On ACE inhibitor and stable.  3. History of paroxysmal atrial fibrillation. Continue on Eliquis, not on any rate controlling agents.   4. Acute on chronicsystolic CHF EF 55% . Repeat echo similar to one in 2016 with similar EF and wall motion abnormality, on ACE inhibitor continue, not on beta blocker will defer this to primary cardiologist and PCP, spotted very well to IV diuresis which was done for 3 days, will be switched to hives and home dose oral Lasix, request PCP to monitor weight and BMP along with diuretic dose closely.  5. Dyslipidemia. Continue on statin.  6. BPH. On Terazosin.   7. DM type II. Currently on glipizide along with sliding scale, while on Solu-Medrol will add Lantus for better control.   Discharge diagnosis     Principal Problem:   Acute respiratory failure with hypoxemia (HCC) Active Problems:   Diabetes mellitus (HCC)   Pacemaker   Physical deconditioning   Permanent atrial fibrillation (HCC)   Complete heart block (HCC)   Essential hypertension   Dyspnea   Chronic anticoagulation   Abnormal CT of the  chest   Acute and chronic respiratory failure with hypoxia ([place])   Community acquired pneumonia        09/10/2016  f/u ov/Randall Odom re: post hosp / transition of care re:  ? pna ? boop asym  now 02 dep 2lpm and pred taper to 10 mg daily  Chief Complaint  Patient presents with  . Hospitalization Follow-up  Breathing has improved. He states feeling tired all of the time. No appetite. The prednisone makes him feel confused.   Walking further than he was but took up to 2lpm to get down the hall now vs baseline RA prior to admit  Able to lie flat on 2lpm  Main c/o  = weak/ poor po intake on acei and lasix   No obvious day to day or daytime variability or assoc excess/ purulent sputum or mucus plugs or hemoptysis or cp or chest tightness, subjective wheeze or overt sinus or hb symptoms. No unusual exp hx or h/o childhood pna/ asthma or knowledge of premature birth.  Sleeping ok without nocturnal  or early am exacerbation  of respiratory  c/o's or need for noct saba. Also denies any obvious fluctuation of symptoms with weather or environmental changes or other aggravating or alleviating factors except as outlined above   Current Medications, Allergies, Complete Past Medical History, Past Surgical History, Family History, and Social History were reviewed in [employer] record.  ROS  The following are not active complaints unless bolded sore throat, dysphagia, dental problems, itching, sneezing,  nasal congestion or excess/ purulent secretions, ear ache,   fever, chills, sweats, unintended wt loss, classically pleuritic or exertional cp,  orthopnea pnd or leg swelling, presyncope, palpitations, abdominal pain, anorexia, nausea, vomiting, diarrhea  or change in bowel or bladder habits, change in stools or urine, dysuria,hematuria,  rash, arthralgias, visual complaints, headache, numbness, weakness or ataxia or problems with walking or coordination,  change in mood/affect or  memory.                           Objective:   Physical Exam  W/c bound chronically ill appearing  elderly wm nad on nasal 02   09/10/2016          08/29/2016        206  08/27/13            210  Wt Readings from Last 3 Encounters:  08/13/13 205 lb (92.987 kg)  08/12/13 204 lb (92.534 kg)  08/09/13 206 lb (93.441 kg)     Vital signs reviewed - note bp 88sys on lisinopril and sats 93% on 2 lpm     HEENT: nl  turbinates, and oropharynx. Nl external ear canals without cough reflex - edentulous    NECK :  without JVD/Nodes/TM/ nl carotid upstrokes bilaterally   LUNGS: no acc muscle use,   Minimal bronchial  Changes RUL post    CV:  RRR  no s3 or murmur or increase in P2,  Trace pitting edema both ankles   ABD:  soft and nontender with nl excursion in the supine position. No bruits or organomegaly, bowel sounds nl  MS:  warm without deformities, calf tenderness, cyanosis or clubbing  SKIN: warm and dry without lesions    NEURO:  alert, approp, no deficits      CXR PA and Lateral:   09/10/2016 :    I personally reviewed images and agree with radiology impression as follows:    Overall improved bibasilar aeration. Mild left base pulmonary opacity remains, favored to represent atelectasis. Residual pneumonia cannot be entirely excluded My impression:  Marked serial improvement      Labs ordered/ reviewed:      Chemistry      Component Value Date/Time   NA 134 (L) 09/10/2016 0919   K 4.3 09/10/2016 0919   CL  95 (L) 09/10/2016 0919   CO2 28 09/10/2016 0919   BUN 90 (HH) 09/10/2016 0919   CREATININE 3.31 (H) 09/10/2016 0919      Component Value Date/Time   CALCIUM 10.0 09/10/2016 0919   ALKPHOS 68 09/10/2016 0919   AST 22 09/10/2016 0919   ALT 32 09/10/2016 0919   BILITOT 0.9 09/10/2016 0919     ALB          3.2                                     09/10/2016       Lab Results  Component Value Date   WBC 31.8 (HH) 09/10/2016   HGB 14.9 09/10/2016   HCT  45.0 09/10/2016   MCV 92.6 09/10/2016   PLT 214.0 09/10/2016      Lab Results  Component Value Date   TSH 2.39 09/10/2016     Lab Results  Component Value Date   PROBNP 30.0 09/10/2016       Lab Results  Component Value Date   ESRSEDRATE 14 09/10/2016   ESRSEDRATE 46 (H) 08/30/2016              Assessment & Plan:

## 2016-09-10 NOTE — Patient Instructions (Addendum)
Stop lisinopril   Please remember to go to the lab and x-ray department downstairs in the basement  for your tests - we will call you with the results when they are available.    Please schedule a follow up office visit in 4 weeks, sooner if needed - if you go home in meantim be sure to return with all medications in hand

## 2016-09-11 ENCOUNTER — Emergency Department (HOSPITAL_COMMUNITY): Payer: Medicare Other

## 2016-09-11 ENCOUNTER — Encounter (HOSPITAL_COMMUNITY): Payer: Self-pay | Admitting: Vascular Surgery

## 2016-09-11 ENCOUNTER — Inpatient Hospital Stay (HOSPITAL_COMMUNITY)
Admission: EM | Admit: 2016-09-11 | Discharge: 2016-09-16 | DRG: 872 | Disposition: A | Discharge status: Skilled Nursing Facility | Payer: Medicare Other | Attending: Internal Medicine | Admitting: Internal Medicine

## 2016-09-11 DIAGNOSIS — H409 Unspecified glaucoma: Secondary | ICD-10-CM | POA: Diagnosis present

## 2016-09-11 DIAGNOSIS — Z888 Allergy status to other drugs, medicaments and biological substances status: Secondary | ICD-10-CM

## 2016-09-11 DIAGNOSIS — F0390 Unspecified dementia without behavioral disturbance: Secondary | ICD-10-CM | POA: Diagnosis present

## 2016-09-11 DIAGNOSIS — R319 Hematuria, unspecified: Secondary | ICD-10-CM | POA: Diagnosis present

## 2016-09-11 DIAGNOSIS — N200 Calculus of kidney: Secondary | ICD-10-CM | POA: Diagnosis present

## 2016-09-11 DIAGNOSIS — E871 Hypo-osmolality and hyponatremia: Secondary | ICD-10-CM | POA: Diagnosis present

## 2016-09-11 DIAGNOSIS — N1831 Chronic kidney disease, stage 3a: Secondary | ICD-10-CM | POA: Diagnosis present

## 2016-09-11 DIAGNOSIS — T380X5A Adverse effect of glucocorticoids and synthetic analogues, initial encounter: Secondary | ICD-10-CM | POA: Diagnosis present

## 2016-09-11 DIAGNOSIS — J189 Pneumonia, unspecified organism: Secondary | ICD-10-CM

## 2016-09-11 DIAGNOSIS — I482 Chronic atrial fibrillation: Secondary | ICD-10-CM | POA: Diagnosis present

## 2016-09-11 DIAGNOSIS — Z7901 Long term (current) use of anticoagulants: Secondary | ICD-10-CM | POA: Diagnosis not present

## 2016-09-11 DIAGNOSIS — E872 Acidosis, unspecified: Secondary | ICD-10-CM

## 2016-09-11 DIAGNOSIS — Z950 Presence of cardiac pacemaker: Secondary | ICD-10-CM | POA: Diagnosis not present

## 2016-09-11 DIAGNOSIS — E785 Hyperlipidemia, unspecified: Secondary | ICD-10-CM | POA: Diagnosis present

## 2016-09-11 DIAGNOSIS — R531 Weakness: Secondary | ICD-10-CM | POA: Diagnosis present

## 2016-09-11 DIAGNOSIS — I10 Essential (primary) hypertension: Secondary | ICD-10-CM | POA: Diagnosis present

## 2016-09-11 DIAGNOSIS — J449 Chronic obstructive pulmonary disease, unspecified: Secondary | ICD-10-CM | POA: Diagnosis present

## 2016-09-11 DIAGNOSIS — B372 Candidiasis of skin and nail: Secondary | ICD-10-CM | POA: Diagnosis not present

## 2016-09-11 DIAGNOSIS — I2510 Atherosclerotic heart disease of native coronary artery without angina pectoris: Secondary | ICD-10-CM | POA: Diagnosis present

## 2016-09-11 DIAGNOSIS — I4821 Permanent atrial fibrillation: Secondary | ICD-10-CM | POA: Diagnosis present

## 2016-09-11 DIAGNOSIS — D72829 Elevated white blood cell count, unspecified: Secondary | ICD-10-CM | POA: Diagnosis present

## 2016-09-11 DIAGNOSIS — T68XXXA Hypothermia, initial encounter: Secondary | ICD-10-CM | POA: Diagnosis present

## 2016-09-11 DIAGNOSIS — E7800 Pure hypercholesterolemia, unspecified: Secondary | ICD-10-CM | POA: Diagnosis present

## 2016-09-11 DIAGNOSIS — J9611 Chronic respiratory failure with hypoxia: Secondary | ICD-10-CM | POA: Diagnosis present

## 2016-09-11 DIAGNOSIS — I959 Hypotension, unspecified: Secondary | ICD-10-CM | POA: Diagnosis present

## 2016-09-11 DIAGNOSIS — E860 Dehydration: Secondary | ICD-10-CM | POA: Diagnosis present

## 2016-09-11 DIAGNOSIS — A419 Sepsis, unspecified organism: Principal | ICD-10-CM | POA: Diagnosis present

## 2016-09-11 DIAGNOSIS — Z87891 Personal history of nicotine dependence: Secondary | ICD-10-CM

## 2016-09-11 DIAGNOSIS — Z794 Long term (current) use of insulin: Secondary | ICD-10-CM

## 2016-09-11 DIAGNOSIS — Z9981 Dependence on supplemental oxygen: Secondary | ICD-10-CM

## 2016-09-11 DIAGNOSIS — N179 Acute kidney failure, unspecified: Secondary | ICD-10-CM | POA: Diagnosis present

## 2016-09-11 DIAGNOSIS — B370 Candidal stomatitis: Secondary | ICD-10-CM | POA: Diagnosis present

## 2016-09-11 DIAGNOSIS — E0821 Diabetes mellitus due to underlying condition with diabetic nephropathy: Secondary | ICD-10-CM

## 2016-09-11 DIAGNOSIS — I110 Hypertensive heart disease with heart failure: Secondary | ICD-10-CM | POA: Diagnosis present

## 2016-09-11 DIAGNOSIS — R0600 Dyspnea, unspecified: Secondary | ICD-10-CM

## 2016-09-11 DIAGNOSIS — I442 Atrioventricular block, complete: Secondary | ICD-10-CM | POA: Diagnosis not present

## 2016-09-11 DIAGNOSIS — L304 Erythema intertrigo: Secondary | ICD-10-CM | POA: Diagnosis not present

## 2016-09-11 DIAGNOSIS — R6520 Severe sepsis without septic shock: Secondary | ICD-10-CM | POA: Diagnosis present

## 2016-09-11 DIAGNOSIS — E1165 Type 2 diabetes mellitus with hyperglycemia: Secondary | ICD-10-CM | POA: Diagnosis not present

## 2016-09-11 DIAGNOSIS — B3781 Candidal esophagitis: Secondary | ICD-10-CM | POA: Diagnosis not present

## 2016-09-11 DIAGNOSIS — Z8249 Family history of ischemic heart disease and other diseases of the circulatory system: Secondary | ICD-10-CM

## 2016-09-11 DIAGNOSIS — B3749 Other urogenital candidiasis: Secondary | ICD-10-CM | POA: Diagnosis present

## 2016-09-11 DIAGNOSIS — I5042 Chronic combined systolic (congestive) and diastolic (congestive) heart failure: Secondary | ICD-10-CM | POA: Diagnosis present

## 2016-09-11 DIAGNOSIS — I5022 Chronic systolic (congestive) heart failure: Secondary | ICD-10-CM | POA: Diagnosis not present

## 2016-09-11 DIAGNOSIS — E119 Type 2 diabetes mellitus without complications: Secondary | ICD-10-CM

## 2016-09-11 LAB — CBC WITH DIFFERENTIAL/PLATELET
BASOS PCT: 0 %
Basophils Absolute: 0 10*3/uL (ref 0.0–0.1)
EOS PCT: 0 %
Eosinophils Absolute: 0 10*3/uL (ref 0.0–0.7)
HCT: 45.6 % (ref 39.0–52.0)
HEMOGLOBIN: 15.6 g/dL (ref 13.0–17.0)
Lymphocytes Relative: 5 %
Lymphs Abs: 1.5 10*3/uL (ref 0.7–4.0)
MCH: 31.5 pg (ref 26.0–34.0)
MCHC: 34.2 g/dL (ref 30.0–36.0)
MCV: 92.1 fL (ref 78.0–100.0)
MONO ABS: 1.2 10*3/uL — AB (ref 0.1–1.0)
Monocytes Relative: 4 %
NEUTROS ABS: 27 10*3/uL — AB (ref 1.7–7.7)
NEUTROS PCT: 91 %
PLATELETS: 178 10*3/uL (ref 150–400)
RBC: 4.95 MIL/uL (ref 4.22–5.81)
RDW: 13.3 % (ref 11.5–15.5)
WBC: 29.7 10*3/uL — ABNORMAL HIGH (ref 4.0–10.5)

## 2016-09-11 LAB — URINALYSIS, ROUTINE W REFLEX MICROSCOPIC
Bacteria, UA: NONE SEEN
Bilirubin Urine: NEGATIVE
GLUCOSE, UA: 50 mg/dL — AB
Ketones, ur: NEGATIVE mg/dL
Nitrite: NEGATIVE
PH: 5 (ref 5.0–8.0)
Protein, ur: NEGATIVE mg/dL
SPECIFIC GRAVITY, URINE: 1.014 (ref 1.005–1.030)

## 2016-09-11 LAB — I-STAT CG4 LACTIC ACID, ED
LACTIC ACID, VENOUS: 3.5 mmol/L — AB (ref 0.5–1.9)
Lactic Acid, Venous: 2.33 mmol/L (ref 0.5–1.9)

## 2016-09-11 LAB — COMPREHENSIVE METABOLIC PANEL
ALK PHOS: 82 U/L (ref 38–126)
ALT: 36 U/L (ref 17–63)
ANION GAP: 14 (ref 5–15)
AST: 31 U/L (ref 15–41)
Albumin: 3.3 g/dL — ABNORMAL LOW (ref 3.5–5.0)
BUN: 101 mg/dL — ABNORMAL HIGH (ref 6–20)
CALCIUM: 10.1 mg/dL (ref 8.9–10.3)
CO2: 24 mmol/L (ref 22–32)
CREATININE: 3.63 mg/dL — AB (ref 0.61–1.24)
Chloride: 93 mmol/L — ABNORMAL LOW (ref 101–111)
GFR, EST AFRICAN AMERICAN: 16 mL/min — AB (ref >60)
GFR, EST NON AFRICAN AMERICAN: 14 mL/min — AB (ref >60)
Glucose, Bld: 267 mg/dL — ABNORMAL HIGH (ref 65–99)
Potassium: 4.5 mmol/L (ref 3.5–5.1)
Sodium: 131 mmol/L — ABNORMAL LOW (ref 135–145)
Total Bilirubin: 1.2 mg/dL (ref 0.3–1.2)
Total Protein: 6.4 g/dL — ABNORMAL LOW (ref 6.5–8.1)

## 2016-09-11 LAB — MAGNESIUM: MAGNESIUM: 2.6 mg/dL — AB (ref 1.7–2.4)

## 2016-09-11 LAB — SODIUM, URINE, RANDOM: SODIUM UR: 27 mmol/L

## 2016-09-11 LAB — I-STAT TROPONIN, ED: Troponin i, poc: 0.03 ng/mL (ref 0.00–0.08)

## 2016-09-11 LAB — TSH: TSH: 1.192 u[IU]/mL (ref 0.350–4.500)

## 2016-09-11 LAB — CREATININE, URINE, RANDOM: CREATININE, URINE: 117.86 mg/dL

## 2016-09-11 LAB — OSMOLALITY, URINE: OSMOLALITY UR: 467 mosm/kg (ref 300–900)

## 2016-09-11 MED ORDER — SODIUM CHLORIDE 0.9 % IV BOLUS (SEPSIS)
1000.0000 mL | Freq: Once | INTRAVENOUS | Status: AC
Start: 2016-09-11 — End: 2016-09-11
  Administered 2016-09-11: 1000 mL via INTRAVENOUS

## 2016-09-11 MED ORDER — PIPERACILLIN-TAZOBACTAM IN DEX 2-0.25 GM/50ML IV SOLN
2.2500 g | Freq: Three times a day (TID) | INTRAVENOUS | Status: DC
  Administered 2016-09-12 – 2016-09-13 (×5): 2.25 g via INTRAVENOUS
  Filled 2016-09-11 (×7): qty 50

## 2016-09-11 MED ORDER — LACTATED RINGERS IV BOLUS (SEPSIS)
1000.0000 mL | Freq: Once | INTRAVENOUS | Status: AC
  Administered 2016-09-11: 1000 mL via INTRAVENOUS

## 2016-09-11 MED ORDER — SODIUM CHLORIDE 0.9 % IV BOLUS (SEPSIS)
500.0000 mL | Freq: Once | INTRAVENOUS | Status: AC
Start: 2016-09-11 — End: 2016-09-11
  Administered 2016-09-11: 500 mL via INTRAVENOUS

## 2016-09-11 MED ORDER — PIPERACILLIN-TAZOBACTAM 3.375 G IVPB 30 MIN
3.3750 g | Freq: Once | INTRAVENOUS | Status: AC
  Administered 2016-09-11: 3.375 g via INTRAVENOUS
  Filled 2016-09-11: qty 50

## 2016-09-11 MED ORDER — VANCOMYCIN HCL IN DEXTROSE 1-5 GM/200ML-% IV SOLN: 1000.0000 mg | INTRAVENOUS | Status: DC

## 2016-09-11 MED ORDER — MAGIC MOUTHWASH
5.0000 mL | Freq: Three times a day (TID) | ORAL | Status: DC
  Administered 2016-09-12 – 2016-09-16 (×12): 5 mL via ORAL
  Filled 2016-09-11 (×17): qty 5

## 2016-09-11 MED ORDER — HYDROCORTISONE NA SUCCINATE PF 100 MG IJ SOLR
100.0000 mg | Freq: Once | INTRAMUSCULAR | Status: AC
  Administered 2016-09-11: 100 mg via INTRAVENOUS
  Filled 2016-09-11: qty 2

## 2016-09-11 MED ORDER — SODIUM CHLORIDE 0.9 % IV BOLUS (SEPSIS)
1000.0000 mL | Freq: Once | INTRAVENOUS | Status: AC
  Administered 2016-09-11: 1000 mL via INTRAVENOUS

## 2016-09-11 MED ORDER — VANCOMYCIN HCL IN DEXTROSE 1-5 GM/200ML-% IV SOLN
1000.0000 mg | Freq: Once | INTRAVENOUS | Status: AC
  Administered 2016-09-11: 1000 mg via INTRAVENOUS
  Filled 2016-09-11: qty 200

## 2016-09-11 NOTE — H&P (Signed)
[name] ZOX:[id number] DOB: September 27, 1926 DOA: 09/11/2016     PCP: Randall Solian, MD   Outpatient Specialists: Cardiology Randall Grooms, MD , pulmonology Randall Odom   Patient coming from:   From facility [facility]  Chief Complaint: Confusion increase weakness decreased by mouth intake  HPI: Randall Odom is a 83 y.o. male with medical history significant of dementia,  Chronic combined systolic and diastolic heart failure  chronic atrial fibrillation, CAD, complete heart block status post pacemaker, DM 2, HTN, R superior segment mass   Presented with increased fatigue and decreased by mouth intake patient has baseline dementia unclear baseline, he was found to be lethargic only oriented to person and place Decreased by mouth intake his labs were noted to have evidence of acute renal failure he was hypothermic down to 96.4 with evaluated white blood cell count the setting of recent steroid use. Poor by mouth intake for the past few days is evidence of thrash in his  Mouth. Patient herself unable to provide detailed history  Denies any pain no chest pain no constipation or diarrhea.  Regarding pertinent Chronic problems: Chronic A.fib on eliquis Patient has chronic cavitary lesion of the right lower lobe PET scan in 2015 showed low uptake repeat CT scan showed no change. Patient has chronic respiratory failure. History of combined systolic diastolic CHF on Lasix last echogram 22nd of June 2018 LV EF: 40% -   45% Akinesis of the mid-apical anteroseptal,   inferoseptal, and apical myocardium. Hypokinesis of the anterior   myocardium. unchanged from prior Patient has been admitted from 21st at 25th of June 2018 for community-acquired pneumonia and discharged as recommended SNF but declined and went home with PT and home oxygen during admission history disease with IV steroids and empirically IV antibiotics as well as diuretics of some improvement he was discharge on oral antibiotics and  steroids complete steroid taper at home  IN ER:  Temp (24hrs), Avg:96.9 F (36.1 C), Min:96.4 F (35.8 C), Max:97.4 F (36.3 C)      on arrival  ED Triage Vitals  Enc Vitals Group     BP 09/11/16 1753 103/60     Pulse Rate 09/11/16 1753 67     Resp 09/11/16 1800 18     Temp 09/11/16 1753 (!) 97.4 F (36.3 C)     Temp Source 09/11/16 1753 Oral     SpO2 09/11/16 1753 99 %     Weight 09/11/16 2017 197 lb (89.4 kg)     Height 09/11/16 2017 5\' 9"  (1.753 m)     Head Circumference --      Peak Flow --      Pain Score --      Pain Loc --      Pain Edu? --      Excl. in [place]? --   Currently RR 14 satting 93% on 3 L HSM BP 120/58 temperature up to 97.1 TSH 1.192 LA 2.33 Troponin 0.03 WBC 29.7 down from yesterday 31.8 Hemoglobin 15.6 Na 131 K 4.5 Glucose 267 Cr 3.63 yesterday 3.31 6/24 1.07 Mg 2.6 CT abd: Partial resolution of bilateral pulmonary consolidations diverticulosis possible evolving staghorn calculi Following Medications were ordered in ER: Medications  piperacillin-tazobactam (ZOSYN) IVPB 2.25 g (not administered)  vancomycin (VANCOCIN) IVPB 1000 mg/200 mL premix (not administered)  sodium chloride 0.9 % bolus 1,000 mL (0 mLs Intravenous Stopped 09/11/16 1910)  sodium chloride 0.9 % bolus 1,000 mL (1,000 mLs Intravenous New Bag/Given 09/11/16  1836)  piperacillin-tazobactam (ZOSYN) IVPB 3.375 g (0 g Intravenous Stopped 09/11/16 2004)  vancomycin (VANCOCIN) IVPB 1000 mg/200 mL premix (0 mg Intravenous Stopped 09/11/16 2033)  hydrocortisone sodium succinate (SOLU-CORTEF) 100 MG injection 100 mg (100 mg Intravenous Given 09/11/16 1933)  lactated ringers bolus 1,000 mL (1,000 mLs Intravenous New Bag/Given 09/11/16 1934)      Hospitalist was called for admission for SIRS/sepsis dehydration resulting in acute renal failure  Review of Systems:    Pertinent positives include:   fatigue, confusion weight loss   loss of appetite,  Constitutional:  No weight loss, night sweats, Fevers,  chills  HEENT:  No headaches, Difficulty swallowing,Tooth/dental problems,Sore throat,  No sneezing, itching, ear ache, nasal congestion, post nasal drip,  Cardio-vascular:  No chest pain, Orthopnea, PND, anasarca, dizziness, palpitations. no Bilateral lower extremity swelling  GI:  No heartburn, indigestion, abdominal pain, nausea, vomiting, diarrhea, change in bowel habits,melena, blood in stool, hematemesis Resp:  no shortness of breath at rest. No dyspnea on exertion, No excess mucus, no productive cough, No non-productive cough, No coughing up of blood.No change in color of mucus.No wheezing. Skin:  no rash or lesions. No jaundice GU:  no dysuria, change in color of urine, no urgency or frequency. No straining to urinate.  No flank pain.  Musculoskeletal:  No joint pain or no joint swelling. No decreased range of motion. No back pain.  Psych:  No change in mood or affect. No depression or anxiety. No memory loss.  Neuro: no localizing neurological complaints, no tingling, no weakness, no double vision, no gait abnormality, no slurred speech, no  As per HPI otherwise 10 point review of systems negative.   Past Medical History: Past Medical History:  Diagnosis Date  . Allergic rhinitis   . Anxiety   . Atrial fibrillation with controlled ventricular response ([place])   . CAD (coronary artery disease)   . Carpal tunnel syndrome   . Chronic combined systolic and diastolic CHF (congestive heart failure) ([place])   . Complete heart block ([place])   . Depressive disorder, not elsewhere classified   . Diabetes mellitus   . Diverticulosis of colon (without mention of hemorrhage)   . DJD (degenerative joint disease)   . Enlarged prostate   . Erectile dysfunction   . Glaucoma    History of glaucoma  . History of BPH   . Hypertension   . Pure hypercholesterolemia   . Recurrent falls   . Sacroiliitis, not elsewhere classified ([place])   . Thoracic or lumbosacral neuritis or radiculitis,  unspecified    Past Surgical History:  Procedure Laterality Date  . APPENDECTOMY  1966  . EP IMPLANTABLE DEVICE N/A 11/16/2015   PPM generator change by Dr Rayann Heman,  SJM Assurity SR PPM for complete heart block  . PACEMAKER INSERTION  09/28/1997   DDD for high-grade AV Block. Had an upgrade to a dual chamber device on 09/09/97. Replaced dual chamber battery on 12/06/04. New device is a Financial risk analyst Identity ADX XL DR, model [id number], seriel [id number].  . TONSILLECTOMY  1953     Social History:  Ambulatory   walker      reports that he quit smoking about 48 years ago. His smoking use included Cigarettes. He has a 37.50 pack-year smoking history. He has never used smokeless tobacco. He reports that he does not drink alcohol or use drugs.  Allergies:   Allergies  Allergen Reactions  . Spironolactone Diarrhea and Nausea And Vomiting   Family History:  Family  History  Problem Relation Age of Onset  . Cancer Mother        breast  . CVA Father   . Anemia Brother   . Heart disease Brother     Medications: Prior to Admission medications   Medication Sig Start Date End Date Taking? Authorizing Provider  apixaban (ELIQUIS) 5 MG TABS tablet TAKE 1 TABLET (5 MG TOTAL) BY MOUTH 2 (TWO) TIMES DAILY. Patient taking differently: Take 5 mg by mouth 2 (two) times daily.  11/18/15  Yes Allred, Jeneen Rinks, MD  Cholecalciferol (VITAMIN D) 2000 UNITS tablet Take 2,000 Units by mouth daily.   Yes [provider]  fluticasone (FLONASE) 50 MCG/ACT nasal spray Place 2 sprays into the nose daily as needed for allergies. Patient taking differently: Place 2 sprays into both nostrils daily as needed for allergies.  05/27/12  Yes Angiulli, Lavon Paganini, PA-C  glipiZIDE (GLUCOTROL) 5 MG tablet Take 5 mg by mouth daily before breakfast.   Yes [provider]  insulin aspart (NOVOLOG) 100 UNIT/ML injection Before each meal 3 times a day, 140-199 - 2 units, 200-250 - 4 units, 251-299 - 6 units,  300-349 - 8 units,   350 or above 10 units. Insulin PEN if approved, provide syringes and needles if needed. Patient taking differently: Inject 1-9 Units into the skin See admin instructions. Inject 1-9 units subcutaneously 4 times daily per sliding scale: CBG 101-150 1 unit, 151-200 2 units, 201-250 3 units, 251-300 5 units, 301-350 7 units, >350 9 units, >400 or <60 call MD 09/02/16  Yes Thurnell Lose, MD  Multiple Vitamin (MULTIVITAMIN WITH MINERALS) TABS Take 1 tablet by mouth daily. 05/27/12  Yes Angiulli, Lavon Paganini, PA-C  OXYGEN Inhale 2 L into the lungs continuous.   Yes [provider]  predniSONE (DELTASONE) 5 MG tablet Label  & dispense according to the schedule below. Take 8 Pills PO for 2 days, 6 Pills PO for 2 days, 4 Pills PO for 3 days, 2 Pills PO for 3 days, 1 Pills PO for 3 days, 1/2 Pill  PO for 3 days then STOP. Total 55 pills. Patient taking differently: Take 2.5-40 mg by mouth See admin instructions. Tapered course ordered 09/02/16: take 8 tablets (40 mg) by mouth daily for 2 days, then take 6 tablets (30 mg) daily for 2 days, then take 4 tablets (20 mg) daily for 3 days, then take 2 tablets (10 mg) daily for 3 days, then take 1 tablet (5 mg) daily for 3 days, then take 1/2 tablet (2.5 mg) daily for 3 days, then STOP. (Total 55 pills). 09/02/16  Yes Thurnell Lose, MD  simvastatin (ZOCOR) 40 MG tablet Take 1 tablet (40 mg total) by mouth at bedtime. 05/27/12  Yes Angiulli, Lavon Paganini, PA-C  terazosin (HYTRIN) 5 MG capsule Take 1 capsule (5 mg total) by mouth at bedtime. 05/27/12  Yes Angiulli, Lavon Paganini, PA-C  TRAVATAN Z 0.004 % SOLN ophthalmic solution Place 1 drop into both eyes at bedtime.   Yes [provider]  furosemide (LASIX) 40 MG tablet Take 1 tablet (40 mg total) by mouth 2 (two) times daily. Patient not taking: Reported on 09/11/2016 09/02/16   Thurnell Lose, MD    Physical Exam: Patient Vitals for the past 24 hrs:  BP Temp Temp src Pulse Resp SpO2 Height Weight  09/11/16  2045 (!) 120/58 - - 71 14 93 % - -  09/11/16 2030 116/83 - - 78 18 93 % - -  09/11/16 2017 - - - - - - 5\' 9"  (1.753 m) 89.4 kg (197 lb)  09/11/16 2015 (!) 102/51 - - 70 15 94 % - -  09/11/16 1945 (!) 101/43 - - 71 16 98 % - -  09/11/16 1930 117/60 - - 72 16 95 % - -  09/11/16 1915 113/72 - - 67 20 100 % - -  09/11/16 1911 - (!) 96.4 F (35.8 C) Rectal - - - - -  09/11/16 1845 (!) 103/51 - - (!) 43 (!) 22 99 % - -  09/11/16 1830 96/70 - - - - - - -  09/11/16 1800 (!) 85/54 - - 74 18 96 % - -  09/11/16 1753 103/60 (!) 97.4 F (36.3 C) Oral 67 - 99 % - -    1. General:  in No Acute distress 2. Psychological: Alert and  Oriented to self not time 3. Head/ENT:     Dry Mucous Membranes                          Head Non traumatic, neck supple                           Poor Dentition 4. SKIN:  ecreased Skin turgor,  Skin clean Dry and intact no rash 5. Heart: Regular rate and rhythm no Murmur, Rub or gallop 6. Lungs no wheezes or crackles   7. Abdomen: Soft, non-tender, Non distended 8. Lower extremities: no clubbing, cyanosis, or edema 9. Neurologically Grossly intact, moving all 4 extremities equally  10. MSK: Normal range of motion   body mass index is 29.09 kg/m.  Labs on Admission:   Labs on Admission: I have personally reviewed following labs and imaging studies  CBC:  Recent Labs Lab 09/10/16 0919 09/11/16 1754  WBC 31.8* 29.7*  NEUTROABS 25.7* 27.0*  HGB 14.9 15.6  HCT 45.0 45.6  MCV 92.6 92.1  PLT 214.0 423   Basic Metabolic Panel:  Recent Labs Lab 09/10/16 0919 09/11/16 1754  NA 134* 131*  K 4.3 4.5  CL 95* 93*  CO2 28 24  GLUCOSE 218* 267*  BUN 90* 101*  CREATININE 3.31* 3.63*  CALCIUM 10.0 10.1  MG  --  2.6*   GFR: Estimated Creatinine Clearance: 15.6 mL/min (A) (by C-G formula based on SCr of 3.63 mg/dL (H)). Liver Function Tests:  Recent Labs Lab 09/10/16 0919 09/11/16 1754  AST 22 31  ALT 32 36  ALKPHOS 68 82  BILITOT 0.9 1.2  PROT  6.3 6.4*  ALBUMIN 3.2* 3.3*   No results for input(s): LIPASE, AMYLASE in the last 168 hours. No results for input(s): AMMONIA in the last 168 hours. Coagulation Profile: No results for input(s): INR, PROTIME in the last 168 hours. Cardiac Enzymes: No results for input(s): CKTOTAL, CKMB, CKMBINDEX, TROPONINI in the last 168 hours. BNP (last 3 results)  Recent Labs  09/10/16 0919  PROBNP 30.0   HbA1C: No results for input(s): HGBA1C in the last 72 hours. CBG: No results for input(s): GLUCAP in the last 168 hours. Lipid Profile: No results for input(s): CHOL, HDL, LDLCALC, TRIG, CHOLHDL, LDLDIRECT in the last 72 hours. Thyroid Function Tests:  Recent Labs  09/11/16 1938  TSH 1.192   Anemia Panel: No results for input(s): VITAMINB12, FOLATE, FERRITIN, TIBC, IRON, RETICCTPCT in the last 72 hours. Urine analysis:    Component Value Date/Time   COLORURINE YELLOW  09/11/2016 1936   APPEARANCEUR HAZY (A) 09/11/2016 1936   LABSPEC 1.014 09/11/2016 1936   PHURINE 5.0 09/11/2016 1936   GLUCOSEU 50 (A) 09/11/2016 1936   HGBUR LARGE (A) 09/11/2016 1936   BILIRUBINUR NEGATIVE 09/11/2016 1936   KETONESUR NEGATIVE 09/11/2016 1936   PROTEINUR NEGATIVE 09/11/2016 1936   UROBILINOGEN 0.2 05/26/2012 0623   NITRITE NEGATIVE 09/11/2016 [place] (A) 09/11/2016 1936   Sepsis Labs: @LABRCNTIP (procalcitonin:4,lacticidven:4) ) Recent Results (from the past 240 hour(s))  Acid Fast Smear (AFB)     Status: None   Collection Time: 09/02/16  9:16 AM  Result Value Ref Range Status   AFB Specimen Processing Concentration  Final   Acid Fast Smear Negative  Final    Comment: (NOTE) Performed At: [facility] [place], [place] [id number] Lindon Romp MD HF:[id number]    Source (AFB) SPUTUM  Final         UA  no evidence of UTI  hematuria  Lab Results  Component Value Date   HGBA1C 7.5 (H) 08/30/2016    Estimated Creatinine Clearance: 15.6  mL/min (A) (by C-G formula based on SCr of 3.63 mg/dL (H)).  BNP (last 3 results)  Recent Labs  09/10/16 0919  PROBNP 30.0     ECG REPORT  Independently reviewed Rate: 67   Rhythm: Paced ST&T Change: No acute ischemic changes   QTC 467  Filed Weights   09/11/16 2017  Weight: 89.4 kg (197 lb)     Cultures:    Component Value Date/Time   SDES BLOOD RIGHT ANTECUBITAL 08/29/2016 1853   SPECREQUEST  08/29/2016 1853    BOTTLES DRAWN AEROBIC AND ANAEROBIC Blood Culture adequate volume   CULT  08/29/2016 1853    NO GROWTH 5 DAYS Performed at [facility], [place] [address], [place], [place] [address]    REPTSTATUS 09/03/2016 FINAL 08/29/2016 1853     Radiological Exams on Admission: Ct Abdomen Pelvis Wo Contrast  Result Date: 09/11/2016 CLINICAL DATA:  Abdominal pain and sepsis of unknown etiology EXAM: CT ABDOMEN AND PELVIS WITHOUT CONTRAST TECHNIQUE: Multidetector CT imaging of the abdomen and pelvis was performed following the standard protocol without IV contrast. COMPARISON:  Chest CT from 08/29/2016 and CT AP from 02/18/2010 FINDINGS: Lower chest: Partially resolves lower lobe pulmonary consolidations and effusions with some residual streaky parenchymal areas of scarring and/or atelectasis remaining. There is coronary arteriosclerosis with right atrial and right ventricular pacing leads noted. Included heart is top-normal in size. There is no pericardial effusion. Hepatobiliary: No focal liver abnormality is seen. No gallstones, gallbladder wall thickening, or biliary dilatation. Pancreas: Atrophic appearing pancreas without ductal dilatation or mass. Spleen: No splenomegaly or focal lesions. Adrenals/Urinary Tract: Normal bilateral adrenal glands. Bilateral nonobstructing renal calculi with exophytic stable cyst off the interpolar right posterior cortex measuring 2.8 cm in diameter. The renal calculi. Medially there is mild caliectasis bilaterally. The largest renal calculus  on the left is approximately 9 mm in length and on the right 14 mm. Nonspecific perinephric fat stranding is seen bilaterally which can be seen in the setting of urinary tract infections. Stomach/Bowel: The stomach is physiologically distended with normal small bowel rotation. Small epiphrenic nodular focus is stable measuring 13 mm in short axis and may represent a ephrenic diverticulum. This appears stable. Differential possibility might include an adjacent lymph node. Duodenal diverticula again noted. Small and large bowel loops demonstrate no obstruction or inflammation. The appendix is surgically absent. Minimal diverticulosis along  the distal descending and sigmoid colon. Vascular/Lymphatic: Aortoiliac and branch vessel atherosclerosis without aneurysm. No pathologically enlarged appearing lymph nodes. Reproductive: Prostate and seminal vesicles are within normal limits. Other: No abdominal wall hernia or abnormality. No abdominopelvic ascites. Musculoskeletal: Degenerative disc disease L5-S1 with grade 1 bordering on grade 2 spondylolisthesis of L5 on S1 secondary to bilateral L5 pars defects. No acute or suspicious osseous abnormality. IMPRESSION: 1. Partial resolution of bilateral lower lobe pulmonary consolidations since prior CT chest. 2. Scattered colonic diverticulosis along the distal descending sigmoid colon without acute diverticulitis. 3. Bilateral renal calculi, new since 02/18/2010 potentially representing evolving staghorn calculi with mild caliectasis. Given perinephric fat stranding, correlate to exclude urinary tract infection. Stable right upper pole 2.8 cm renal simple cyst. 4. Aortoiliac and branch vessel atherosclerosis. 5. Chronic L5 pars defects with grade 1 bordering on grade 2 spondylolisthesis of L5 on S1. Electronically Signed   By: Ashley Royalty M.D.   On: 09/11/2016 20:54   Dg Chest 2 View  Result Date: 09/11/2016 CLINICAL DATA:  Pt reports to the ED for eval of abnormal lab. Per  EMS his BUN is elevated at 100. Pt reports generalized weakness "for a while". Hx of DM and HTN. Former smoker EXAM: CHEST  2 VIEW COMPARISON:  09/10/2016 FINDINGS: Right-sided transvenous pacemaker leads overlie the right atrium and right ventricle. An orphan lead overlies the right anterior chest. Heart size is normal. There is bibasilar atelectasis or scarring. There are no focal consolidations. Small left pleural effusion is noted. No pulmonary edema. IMPRESSION: 1. Scarring or atelectasis at the bases, stable in appearance. 2.  No evidence for acute  abnormality. Electronically Signed   By: Nolon Nations M.D.   On: 09/11/2016 18:31   Dg Chest 2 View  Result Date: 09/10/2016 CLINICAL DATA:  Dyspnea on exertion.  History of pneumonia. EXAM: CHEST  2 VIEW COMPARISON:  09/02/2016 FINDINGS: Hyperinflation. Accentuation of expected thoracic kyphosis with mild loss of vertebral body height at mid thoracic levels. Pacer/AICD device. Midline trachea. The Chin overlies the apices. Normal heart size. Tortuous thoracic aorta. Atherosclerosis in the transverse aorta. Probable trace left pleural fluid. No pneumothorax. Improved bibasilar aeration, with patchy left base airspace disease and minimal probable right base scarring remaining. No new pulmonary opacity. IMPRESSION: Overall improved bibasilar aeration. Mild left base pulmonary opacity remains, favored to represent atelectasis. Residual pneumonia cannot be entirely excluded. No new superimposed process. Aortic Atherosclerosis (ICD10-I70.0). Electronically Signed   By: Abigail Miyamoto M.D.   On: 09/10/2016 09:49    Chart has been reviewed    Assessment/Plan   83 y.o. male with medical history significant of dementia,  Chronic combined systolic and diastolic heart failure  chronic atrial fibrillation, CAD, complete heart block status post pacemaker, DM 2, HTN, R superior segment mass  Admitted for  SIRS/sepsis dehydration resulting in acute renal  failure   Present on Admission: . Thrush likely secondary to recent exposure to steroids will treat Magic  mouthwash . Chronic combined systolic and diastolic heart failure (HCC) current appears to be dehydrated we'll hold Lasix, resume when able . Dementia expect some degree of sundowning . Acute renal failure (ARF) (HCC) - - likely secondary to dehydration, check FeNA and if not improved with IVF would   consider renal consult.   Hold ACE inhibitor .Chronic Atrial fibrillation (HCC)        - CHA2DS2 vas score 6  : continue current anticoagulation with   Eliquis           -  Rate control:  Currently does not require not on betablocker       -    . Pacemaker stable history of complete heart block . Dehydration  - rehydrate check orthostatics prior to discharge . Chronic respiratory failure with hypoxia (HCC) - currently at baseline continue to monitor continue home oxygen . Essential hypertension - given sepsis allow permissive hypertension hold ACE inhibitor given acute renal failure  . Sepsis ([place]) - unclear source but meeting sepsis criteria given hypothermia and elevated white blood cell count given recent admission will continue broad-spectrum antibiotics for now and rehydrate and monitor overnight and step down. Given soft BP hold home blood pressure medications. Given recent steroid use will initiate Stress dose steroids Hydrocortisone IV . Leukocytosis - possibly secondary to sepsis versus U recent steroid use we'll continue to monitor have come down from yesterday . Hematuria - possibly developing staghorn calculus will obtain urine culture is persistent hematuria will need to have this worked up further as an outpatient with urology . Hyponatremia likely in the setting of dehydration. Will order urine electrolytes check TSH  Diabetes mellitus - will order sliding scale continue to monitor, hold glipizide . CAD (coronary artery disease) stable continue home medications currently not  endorsing chest  Other plan as per orders.  DVT prophylaxis:  epixiban    Code Status:  FULL CODE    as per patient  Family Communication:   Family not at  Bedside    Disposition Plan:                             Back to current facility when stable                                                Would benefit from PT/OT eval prior to DC   ordered                      Social Work  Diabetes coordinator  Nutrition                            Consults called: none  Admission status:    inpatient     Level of care    SDU      I have spent a total of 56 min on this admission    Avriana Joo 09/11/2016, 10:22 PM   [facility]  Pager [phone]   after 2 AM please page floor coverage PA If 7AM-7PM, please contact the day team taking care of the patient  [email or web address]  Password [email or web address]

## 2016-09-11 NOTE — ED Triage Notes (Signed)
Pt reports to the ED for eval of abnormal lab. Pt is brought here via [facility] from [facility]. Per EMS his BUN is elevated at 100. Per EMS he has also been having some generalized weakness. Pt has hx of COPD and is on chronic O2. CBG en route was approx 300. Pt lethargic but oriented only to person, place, and situation but is disoriented to time.

## 2016-09-11 NOTE — Progress Notes (Signed)
Pharmacy Antibiotic Note  Randall Odom is a 83 y.o. male admitted on 09/11/2016 with sepsis.  Pharmacy has been consulted for vancomycin and zosyn dosing. Creatinine elevated to 3.6 (BL ~1.00); CrCL ~10-7mL/min; leukocytic to 29.7; lactate up at 2.33. First doses of each have already been given.   Plan: Vancomycin 1000 mg IV every 48 hours.  Goal trough 15-20 mcg/mL.  Zosyn 2.25g IV q8h F/u c/s, renal function, clinical status, LOT, ability to de-escalate  Temp (24hrs), Avg:97.4 F (36.3 C), Min:97.4 F (36.3 C), Max:97.4 F (36.3 C)   Recent Labs Lab 09/10/16 0919 09/11/16 1754 09/11/16 1815  WBC 31.8* 29.7*  --   CREATININE 3.31* 3.63*  --   LATICACIDVEN  --   --  2.33*    Estimated Creatinine Clearance: 15.6 mL/min (A) (by C-G formula based on SCr of 3.63 mg/dL (H)).    Allergies  Allergen Reactions  . Spironolactone Diarrhea and Nausea And Vomiting    Antimicrobials this admission: Vancomycin 7/4 >>  Zosyn 7/4 >>   Dose adjustments this admission:  Microbiology results: 7/4 BCx: Ordered 7/4 UCx: Ordered   Thank you for allowing pharmacy to be a part of this patient's care.  Dierdre Harness, Cain [place], PharmD Clinical Pharmacy Resident [phone] (Pager) 09/11/2016 7:22 PM

## 2016-09-11 NOTE — Assessment & Plan Note (Signed)
Lab Results  Component Value Date   CREATININE 3.31 (H) 09/10/2016   CREATININE 1.07 09/01/2016   CREATININE 1.07 08/30/2016    Occurred on acei and lasix with poor po intake and low bp in office 09/10/2016   rec D/c  acei 09/10/2016 and rec hold lasix until po intake normalized

## 2016-09-11 NOTE — Assessment & Plan Note (Signed)
See Cxr / CT chest 08/29/2016 with nl bnp - Quant TB 08/31/16  Neg - Sputum AFB x 2 09/01/16> smear neg   - ESR   08/30/16  = 46  On prednisone 10  taper schedule to off x 1 more week  cxr's and clinical course most c/w boop ? Related to atypical infection but TB has been excluded and cxr is markedly improved  The goal with a chronic steroid dependent illness is always arriving at the lowest effective dose that controls the disease/symptoms and not accepting a set "formula" which is based on statistics or guidelines that don't always take into account patient  variability or the natural hx of the dz in every individual patient, which may well vary over time.  For now therefore I recommend the patient taper off as planned   I had an extended discussion with the patient reviewing all relevant studies completed to date and  lasting 25 minutes of a 40  minute post hosp f/u transition of care offic  visit addressing  severe non-specific but potentially very serious refractory respiratory symptoms of uncertain and potentially multiple  etiologies.   Each maintenance medication was reviewed in detail including most importantly the difference between maintenance and prns and under what circumstances the prns are to be triggered using an action plan format that is not reflected in the computer generated alphabetically organized AVS.    Please see AVS for specific instructions unique to this office visit that I personally wrote and verbalized to the the pt in detail and then reviewed with pt  by my nurse highlighting any changes in therapy/plan of care  recommended at today's visit.

## 2016-09-11 NOTE — Assessment & Plan Note (Signed)
At this point his problem is more deconditioning than anything else > continue with rehab

## 2016-09-11 NOTE — Assessment & Plan Note (Signed)
Appears resolved, no further abx indicated

## 2016-09-11 NOTE — ED Provider Notes (Signed)
[facility] DEPT Provider Note   CSN: [id number] Arrival date & time: 09/11/16  1745     History   Chief Complaint Chief Complaint  Patient presents with  . Abnormal Lab    HPI Randall Odom is a 83 y.o. male.  HPI   83 yo M with PMHx of HTN, HLD, sCHF here with abnormal lab. Per report, pt has had decreased appetite and PO intake for several days. He has been increasingly weak over this time period. He had labs drawn at the facility and reportedly has BUN>100, so was sent here for fluids. Pt AO to self, situation at baseline.  On my assessment, pt declines complaints other than dry mouth. No sore throat. No abdominal pain, n/v.   Level 5 caveat invoked as remainder of history, ROS, and physical exam limited due to patient's dementia.   Past Medical History:  Diagnosis Date  . Allergic rhinitis   . Anxiety   . Atrial fibrillation with controlled ventricular response ([place])   . CAD (coronary artery disease)   . Carpal tunnel syndrome   . Chronic combined systolic and diastolic CHF (congestive heart failure) ([place])   . Complete heart block ([place])   . Depressive disorder, not elsewhere classified   . Diabetes mellitus   . Diverticulosis of colon (without mention of hemorrhage)   . DJD (degenerative joint disease)   . Enlarged prostate   . Erectile dysfunction   . Glaucoma    History of glaucoma  . History of BPH   . Hypertension   . Pure hypercholesterolemia   . Recurrent falls   . Sacroiliitis, not elsewhere classified ([place])   . Thoracic or lumbosacral neuritis or radiculitis, unspecified     Patient Active Problem List   Diagnosis Date Noted  . Chronic respiratory failure with hypoxia ([place]) 09/11/2016  . Dementia 09/11/2016  . Acute renal failure (ARF) ([place]) 09/11/2016  . Sepsis ([place]) 09/11/2016  . Leukocytosis 09/11/2016  . Hematuria 09/11/2016  . Hyponatremia 09/11/2016  . Thrush 09/11/2016  . Community acquired pneumonia   . Pulmonary infiltrate  08/30/2016  . Acute respiratory failure with hypoxemia ([place]) 08/29/2016  . Abnormal CT of the chest 08/29/2016  . Acute and chronic respiratory failure with hypoxia ([place]) 08/29/2016  . Chronic anticoagulation 07/10/2016  . Abnormal CT scan, chest 08/13/2013  . Hyperlipidemia 08/09/2013  . Essential hypertension 08/09/2013  . Dyspnea on exertion 08/09/2013  . Chronic combined systolic and diastolic heart failure ([place]) 08/09/2013  . Permanent atrial fibrillation ([place]) 02/22/2013  . Complete heart block ([place]) 02/22/2013  . Occlusion and stenosis of carotid artery without mention of cerebral infarction 06/02/2012  . Physical deconditioning 05/18/2012  . C. difficile colitis 05/13/2012  . Dehydration 05/13/2012  . Diarrhea 05/12/2012  . Acute renal failure ([place]) 05/12/2012  . Hypokalemia 05/12/2012  . Syncope and collapse 05/06/2011  . Diabetes mellitus ([place]) 05/06/2011  . Pacemaker 05/06/2011  . CAD (coronary artery disease) 05/06/2011    Past Surgical History:  Procedure Laterality Date  . APPENDECTOMY  1966  . EP IMPLANTABLE DEVICE N/A 11/16/2015   PPM generator change by Dr Rayann Heman,  S[facility] Assurity SR PPM for complete heart block  . PACEMAKER INSERTION  09/28/1997   DDD for high-grade AV Block. Had an upgrade to a dual chamber device on 09/09/97. Replaced dual chamber battery on 12/06/04. New device is a Financial risk analyst Identity ADX XL DR, model [id number], seriel [id number].  . [place]  Medications    Prior to Admission medications   Medication Sig Start Date End Date Taking? Authorizing Provider  apixaban (ELIQUIS) 5 MG TABS tablet TAKE 1 TABLET (5 MG TOTAL) BY MOUTH 2 (TWO) TIMES DAILY. Patient taking differently: Take 5 mg by mouth 2 (two) times daily.  11/18/15  Yes Allred, Jeneen Rinks, MD  Cholecalciferol (VITAMIN D) 2000 UNITS tablet Take 2,000 Units by mouth daily.   Yes [provider]  fluticasone (FLONASE) 50 MCG/ACT nasal spray Place 2 sprays into the nose  daily as needed for allergies. Patient taking differently: Place 2 sprays into both nostrils daily as needed for allergies.  05/27/12  Yes Angiulli, Lavon Paganini, PA-C  glipiZIDE (GLUCOTROL) 5 MG tablet Take 5 mg by mouth daily before breakfast.   Yes [provider]  insulin aspart (NOVOLOG) 100 UNIT/ML injection Before each meal 3 times a day, 140-199 - 2 units, 200-250 - 4 units, 251-299 - 6 units,  300-349 - 8 units,  350 or above 10 units. Insulin PEN if approved, provide syringes and needles if needed. Patient taking differently: Inject 1-9 Units into the skin See admin instructions. Inject 1-9 units subcutaneously 4 times daily per sliding scale: CBG 101-150 1 unit, 151-200 2 units, 201-250 3 units, 251-300 5 units, 301-350 7 units, >350 9 units, >400 or <60 call MD 09/02/16  Yes Thurnell Lose, MD  Multiple Vitamin (MULTIVITAMIN WITH MINERALS) TABS Take 1 tablet by mouth daily. 05/27/12  Yes Angiulli, Lavon Paganini, PA-C  OXYGEN Inhale 2 L into the lungs continuous.   Yes [provider]  predniSONE (DELTASONE) 5 MG tablet Label  & dispense according to the schedule below. Take 8 Pills PO for 2 days, 6 Pills PO for 2 days, 4 Pills PO for 3 days, 2 Pills PO for 3 days, 1 Pills PO for 3 days, 1/2 Pill  PO for 3 days then STOP. Total 55 pills. Patient taking differently: Take 2.5-40 mg by mouth See admin instructions. Tapered course ordered 09/02/16: take 8 tablets (40 mg) by mouth daily for 2 days, then take 6 tablets (30 mg) daily for 2 days, then take 4 tablets (20 mg) daily for 3 days, then take 2 tablets (10 mg) daily for 3 days, then take 1 tablet (5 mg) daily for 3 days, then take 1/2 tablet (2.5 mg) daily for 3 days, then STOP. (Total 55 pills). 09/02/16  Yes Thurnell Lose, MD  simvastatin (ZOCOR) 40 MG tablet Take 1 tablet (40 mg total) by mouth at bedtime. 05/27/12  Yes Angiulli, Lavon Paganini, PA-C  terazosin (HYTRIN) 5 MG capsule Take 1 capsule (5 mg total) by mouth at bedtime.  05/27/12  Yes Angiulli, Lavon Paganini, PA-C  TRAVATAN Z 0.004 % SOLN ophthalmic solution Place 1 drop into both eyes at bedtime.   Yes [provider]  furosemide (LASIX) 40 MG tablet Take 1 tablet (40 mg total) by mouth 2 (two) times daily. Patient not taking: Reported on 09/11/2016 09/02/16   Thurnell Lose, MD    Family History Family History  Problem Relation Age of Onset  . Cancer Mother        breast  . CVA Father   . Anemia Brother   . Heart disease Brother     Social History Social History  Substance Use Topics  . Smoking status: Former Smoker    Packs/day: 1.50    Years: 25.00    Types: Cigarettes    Quit date: 03/11/1968  . Smokeless  tobacco: Never Used  . Alcohol use No     Allergies   Spironolactone   Review of Systems Review of Systems  Unable to perform ROS: Dementia     Physical Exam Updated Vital Signs BP (!) 120/58   Pulse 71   Temp (!) 96.4 F (35.8 C) (Rectal) Comment: Dr. Myrene Buddy made aware  Resp 14   Ht 5\' 9"  (1.753 m)   Wt 89.4 kg (197 lb)   SpO2 93%   BMI 29.09 kg/m   Physical Exam  Constitutional: He is oriented to person, place, and time. He appears well-developed and well-nourished. No distress.  HENT:  Head: Normocephalic and atraumatic.  Markedly dry MM. White plaques with erythematous rims noted in posterior soft palate and tonsils, as well as tongue, easily scraped off.  Eyes: Conjunctivae are normal.  Neck: Neck supple.  Cardiovascular: Normal rate, regular rhythm and normal heart sounds.  Exam reveals no friction rub.   No murmur heard. Pulmonary/Chest: Effort normal and breath sounds normal. No respiratory distress. He has no wheezes. He has no rales.  Abdominal: He exhibits no distension.  Musculoskeletal: He exhibits no edema.  Neurological: He is alert and oriented to person, place, and time. He exhibits normal muscle tone.  Skin: Skin is warm. Capillary refill takes less than 2 seconds.  Psychiatric: He has a  normal mood and affect.  Nursing note and vitals reviewed.    ED Treatments / Results  Labs (all labs ordered are listed, but only abnormal results are displayed) Labs Reviewed  CBC WITH DIFFERENTIAL/PLATELET - Abnormal; Notable for the following:       Result Value   WBC 29.7 (*)    Neutro Abs 27.0 (*)    Monocytes Absolute 1.2 (*)    All other components within normal limits  COMPREHENSIVE METABOLIC PANEL - Abnormal; Notable for the following:    Sodium 131 (*)    Chloride 93 (*)    Glucose, Bld 267 (*)    BUN 101 (*)    Creatinine, Ser 3.63 (*)    Total Protein 6.4 (*)    Albumin 3.3 (*)    GFR calc non Af Amer 14 (*)    GFR calc Af Amer 16 (*)    All other components within normal limits  MAGNESIUM - Abnormal; Notable for the following:    Magnesium 2.6 (*)    All other components within normal limits  URINALYSIS, ROUTINE W REFLEX MICROSCOPIC - Abnormal; Notable for the following:    APPearance HAZY (*)    Glucose, UA 50 (*)    Hgb urine dipstick LARGE (*)    Leukocytes, UA TRACE (*)    Squamous Epithelial / LPF 0-5 (*)    All other components within normal limits  I-STAT CG4 LACTIC ACID, ED - Abnormal; Notable for the following:    Lactic Acid, Venous 2.33 (*)    All other components within normal limits  CULTURE, BLOOD (ROUTINE X 2)  CULTURE, BLOOD (ROUTINE X 2)  URINE CULTURE  TSH  CREATININE, URINE, RANDOM  SODIUM, URINE, RANDOM  OSMOLALITY, URINE  PREALBUMIN  I-STAT TROPOININ, ED  I-STAT CG4 LACTIC ACID, ED    EKG  EKG Interpretation  Date/Time:  Wednesday September 11 2016 18:33:17 EDT Ventricular Rate:  67 PR Interval:    QRS Duration: 176 QT Interval:  450 QTC Calculation: 476 R Axis:   -84 Text Interpretation:  Ventricular pacing, unchanged from prior though spikes less apparent Otherwise no significant change Confirmed  by Duffy Bruce [phone]) on 09/11/2016 6:44:21 PM       Radiology Ct Abdomen Pelvis Wo Contrast  Result Date:  09/11/2016 CLINICAL DATA:  Abdominal pain and sepsis of unknown etiology EXAM: CT ABDOMEN AND PELVIS WITHOUT CONTRAST TECHNIQUE: Multidetector CT imaging of the abdomen and pelvis was performed following the standard protocol without IV contrast. COMPARISON:  Chest CT from 08/29/2016 and CT AP from 02/18/2010 FINDINGS: Lower chest: Partially resolves lower lobe pulmonary consolidations and effusions with some residual streaky parenchymal areas of scarring and/or atelectasis remaining. There is coronary arteriosclerosis with right atrial and right ventricular pacing leads noted. Included heart is top-normal in size. There is no pericardial effusion. Hepatobiliary: No focal liver abnormality is seen. No gallstones, gallbladder wall thickening, or biliary dilatation. Pancreas: Atrophic appearing pancreas without ductal dilatation or mass. Spleen: No splenomegaly or focal lesions. Adrenals/Urinary Tract: Normal bilateral adrenal glands. Bilateral nonobstructing renal calculi with exophytic stable cyst off the interpolar right posterior cortex measuring 2.8 cm in diameter. The renal calculi. Medially there is mild caliectasis bilaterally. The largest renal calculus on the left is approximately 9 mm in length and on the right 14 mm. Nonspecific perinephric fat stranding is seen bilaterally which can be seen in the setting of urinary tract infections. Stomach/Bowel: The stomach is physiologically distended with normal small bowel rotation. Small epiphrenic nodular focus is stable measuring 13 mm in short axis and may represent a ephrenic diverticulum. This appears stable. Differential possibility might include an adjacent lymph node. Duodenal diverticula again noted. Small and large bowel loops demonstrate no obstruction or inflammation. The appendix is surgically absent. Minimal diverticulosis along the distal descending and sigmoid colon. Vascular/Lymphatic: Aortoiliac and branch vessel atherosclerosis without aneurysm.  No pathologically enlarged appearing lymph nodes. Reproductive: Prostate and seminal vesicles are within normal limits. Other: No abdominal wall hernia or abnormality. No abdominopelvic ascites. Musculoskeletal: Degenerative disc disease L5-S1 with grade 1 bordering on grade 2 spondylolisthesis of L5 on S1 secondary to bilateral L5 pars defects. No acute or suspicious osseous abnormality. IMPRESSION: 1. Partial resolution of bilateral lower lobe pulmonary consolidations since prior CT chest. 2. Scattered colonic diverticulosis along the distal descending sigmoid colon without acute diverticulitis. 3. Bilateral renal calculi, new since 02/18/2010 potentially representing evolving staghorn calculi with mild caliectasis. Given perinephric fat stranding, correlate to exclude urinary tract infection. Stable right upper pole 2.8 cm renal simple cyst. 4. Aortoiliac and branch vessel atherosclerosis. 5. Chronic L5 pars defects with grade 1 bordering on grade 2 spondylolisthesis of L5 on S1. Electronically Signed   By: Ashley Royalty M.D.   On: 09/11/2016 20:54   Dg Chest 2 View  Result Date: 09/11/2016 CLINICAL DATA:  Pt reports to the ED for eval of abnormal lab. Per EMS his BUN is elevated at 100. Pt reports generalized weakness "for a while". Hx of DM and HTN. Former smoker EXAM: CHEST  2 VIEW COMPARISON:  09/10/2016 FINDINGS: Right-sided transvenous pacemaker leads overlie the right atrium and right ventricle. An orphan lead overlies the right anterior chest. Heart size is normal. There is bibasilar atelectasis or scarring. There are no focal consolidations. Small left pleural effusion is noted. No pulmonary edema. IMPRESSION: 1. Scarring or atelectasis at the bases, stable in appearance. 2.  No evidence for acute  abnormality. Electronically Signed   By: Nolon Nations M.D.   On: 09/11/2016 18:31   Dg Chest 2 View  Result Date: 09/10/2016 CLINICAL DATA:  Dyspnea on exertion.  History of pneumonia. EXAM: CHEST  2  VIEW COMPARISON:  09/02/2016 FINDINGS: Hyperinflation. Accentuation of expected thoracic kyphosis with mild loss of vertebral body height at mid thoracic levels. Pacer/AICD device. Midline trachea. The Chin overlies the apices. Normal heart size. Tortuous thoracic aorta. Atherosclerosis in the transverse aorta. Probable trace left pleural fluid. No pneumothorax. Improved bibasilar aeration, with patchy left base airspace disease and minimal probable right base scarring remaining. No new pulmonary opacity. IMPRESSION: Overall improved bibasilar aeration. Mild left base pulmonary opacity remains, favored to represent atelectasis. Residual pneumonia cannot be entirely excluded. No new superimposed process. Aortic Atherosclerosis (ICD10-I70.0). Electronically Signed   By: Abigail Miyamoto M.D.   On: 09/10/2016 09:49    Procedures Procedures (including critical care time)  CRITICAL CARE Performed by: Evonnie Pat   Total critical care time: 35 minutes  Critical care time was exclusive of separately billable procedures and treating other patients.  Critical care was necessary to treat or prevent imminent or life-threatening deterioration.  Critical care was time spent personally by me on the following activities: development of treatment plan with patient and/or surrogate as well as nursing, discussions with consultants, evaluation of patient's response to treatment, examination of patient, obtaining history from patient or surrogate, ordering and performing treatments and interventions, ordering and review of laboratory studies, ordering and review of radiographic studies, pulse oximetry and re-evaluation of patient's condition.    Medications Ordered in ED Medications  piperacillin-tazobactam (ZOSYN) IVPB 2.25 g (not administered)  vancomycin (VANCOCIN) IVPB 1000 mg/200 mL premix (not administered)  magic mouthwash (not administered)  sodium chloride 0.9 % bolus 1,000 mL (0 mLs Intravenous Stopped  09/11/16 1910)  sodium chloride 0.9 % bolus 1,000 mL (1,000 mLs Intravenous New Bag/Given 09/11/16 1836)  piperacillin-tazobactam (ZOSYN) IVPB 3.375 g (0 g Intravenous Stopped 09/11/16 2004)  vancomycin (VANCOCIN) IVPB 1000 mg/200 mL premix (0 mg Intravenous Stopped 09/11/16 2033)  hydrocortisone sodium succinate (SOLU-CORTEF) 100 MG injection 100 mg (100 mg Intravenous Given 09/11/16 1933)  lactated ringers bolus 1,000 mL (1,000 mLs Intravenous New Bag/Given 09/11/16 1934)     Initial Impression / Assessment and Plan / ED Course  I have reviewed the triage vital signs and the nursing notes.  Pertinent labs & imaging results that were available during my care of the patient were reviewed by me and considered in my medical decision making (see chart for details).     83 yo M with PMHx as above here with hypotension andi elevated BUN/Cr on OSH labs. On arrival, pt alert, mentating at baseline but markedly dehydrated. OP shows +thrush and pt has been on long course of steroids recently. Suspect pre-renal AKI 2/2 poor PO intake, also concern for possible infection. Will check labs, start IVF, and re-assess.  Lab work shows significant leukocytosis with neutrophil predominance, marked AKI, and lactic acidosis. Pt initially not febrile but now hypothermic - code sepsis initiated with broad-spectrum ABX. UA obtained and is pending. Unclear etiology at this time - will check CT. BP markedly improved with fluids. IV hydrocortisone given due to hypotension, hypothermia with recent long steroid course.  CT scan neg. Admit for sepsis versus dehydration.  Final Clinical Impressions(s) / ED Diagnoses   Final diagnoses:  Sepsis, due to unspecified organism ([place])  AKI (acute kidney injury) ([place])  Oral thrush  Lactic acidosis    New Prescriptions New Prescriptions   No medications on file     Duffy Bruce, MD 09/11/16 2202

## 2016-09-11 NOTE — ED Notes (Signed)
4 warm blankets applied to pt

## 2016-09-11 NOTE — Assessment & Plan Note (Signed)
02 2lpm 24 / 7 since 08/29/16   Adequate control on present rx, reviewed in detail with pt > no change in rx needed  For now, will probably eventually be able to taper it

## 2016-09-12 DIAGNOSIS — T68XXXA Hypothermia, initial encounter: Secondary | ICD-10-CM

## 2016-09-12 DIAGNOSIS — I5022 Chronic systolic (congestive) heart failure: Secondary | ICD-10-CM

## 2016-09-12 DIAGNOSIS — N390 Urinary tract infection, site not specified: Secondary | ICD-10-CM

## 2016-09-12 DIAGNOSIS — L304 Erythema intertrigo: Secondary | ICD-10-CM

## 2016-09-12 DIAGNOSIS — B372 Candidiasis of skin and nail: Secondary | ICD-10-CM

## 2016-09-12 DIAGNOSIS — I442 Atrioventricular block, complete: Secondary | ICD-10-CM

## 2016-09-12 DIAGNOSIS — B3781 Candidal esophagitis: Secondary | ICD-10-CM

## 2016-09-12 LAB — GLUCOSE, CAPILLARY
GLUCOSE-CAPILLARY: 219 mg/dL — AB (ref 65–99)
Glucose-Capillary: 259 mg/dL — ABNORMAL HIGH (ref 65–99)
Glucose-Capillary: 244 mg/dL — ABNORMAL HIGH (ref 65–99)

## 2016-09-12 LAB — CBC
HCT: 39.6 % (ref 39.0–52.0)
Hemoglobin: 13 g/dL (ref 13.0–17.0)
MCH: 30.2 pg (ref 26.0–34.0)
MCHC: 32.8 g/dL (ref 30.0–36.0)
MCV: 92.1 fL (ref 78.0–100.0)
PLATELETS: 137 10*3/uL — AB (ref 150–400)
RBC: 4.3 MIL/uL (ref 4.22–5.81)
RDW: 13.4 % (ref 11.5–15.5)
WBC: 21.8 10*3/uL — AB (ref 4.0–10.5)

## 2016-09-12 LAB — PREALBUMIN: Prealbumin: 32.3 mg/dL (ref 18–38)

## 2016-09-12 LAB — COMPREHENSIVE METABOLIC PANEL
ALT: 22 U/L (ref 17–63)
AST: 24 U/L (ref 15–41)
Albumin: 2.5 g/dL — ABNORMAL LOW (ref 3.5–5.0)
Alkaline Phosphatase: 63 U/L (ref 38–126)
Anion gap: 12 (ref 5–15)
BILIRUBIN TOTAL: 1.3 mg/dL — AB (ref 0.3–1.2)
BUN: 85 mg/dL — AB (ref 6–20)
CALCIUM: 8.4 mg/dL — AB (ref 8.9–10.3)
CO2: 20 mmol/L — ABNORMAL LOW (ref 22–32)
CREATININE: 2.88 mg/dL — AB (ref 0.61–1.24)
Chloride: 104 mmol/L (ref 101–111)
GFR, EST AFRICAN AMERICAN: 21 mL/min — AB (ref >60)
GFR, EST NON AFRICAN AMERICAN: 18 mL/min — AB (ref >60)
Glucose, Bld: 215 mg/dL — ABNORMAL HIGH (ref 65–99)
Potassium: 4.6 mmol/L (ref 3.5–5.1)
Sodium: 136 mmol/L (ref 135–145)
TOTAL PROTEIN: 4.9 g/dL — AB (ref 6.5–8.1)

## 2016-09-12 LAB — MAGNESIUM: Magnesium: 2.1 mg/dL (ref 1.7–2.4)

## 2016-09-12 LAB — LACTIC ACID, PLASMA
LACTIC ACID, VENOUS: 3.3 mmol/L — AB (ref 0.5–1.9)
LACTIC ACID, VENOUS: 4.3 mmol/L — AB (ref 0.5–1.9)

## 2016-09-12 LAB — PHOSPHORUS: Phosphorus: 5 mg/dL — ABNORMAL HIGH (ref 2.5–4.6)

## 2016-09-12 LAB — MRSA PCR SCREENING: MRSA BY PCR: NEGATIVE

## 2016-09-12 LAB — PROTIME-INR
INR: 1.33
Prothrombin Time: 16.5 seconds — ABNORMAL HIGH (ref 11.4–15.2)

## 2016-09-12 LAB — PROCALCITONIN

## 2016-09-12 LAB — APTT: aPTT: 33 seconds (ref 24–36)

## 2016-09-12 MED ORDER — SODIUM CHLORIDE 0.9% FLUSH
3.0000 mL | Freq: Two times a day (BID) | INTRAVENOUS | Status: DC
  Administered 2016-09-12 – 2016-09-16 (×7): 3 mL via INTRAVENOUS

## 2016-09-12 MED ORDER — HYDROCODONE-ACETAMINOPHEN 5-325 MG PO TABS
1.0000 | ORAL_TABLET | ORAL | Status: DC | PRN
  Administered 2016-09-14: 1 via ORAL
  Filled 2016-09-12: qty 1

## 2016-09-12 MED ORDER — APIXABAN 2.5 MG PO TABS
2.5000 mg | ORAL_TABLET | Freq: Two times a day (BID) | ORAL | Status: DC
  Administered 2016-09-12 – 2016-09-16 (×9): 2.5 mg via ORAL
  Filled 2016-09-12 (×11): qty 1

## 2016-09-12 MED ORDER — IPRATROPIUM BROMIDE 0.02 % IN SOLN
0.5000 mg | Freq: Four times a day (QID) | RESPIRATORY_TRACT | Status: DC
  Administered 2016-09-12 – 2016-09-13 (×7): 0.5 mg via RESPIRATORY_TRACT
  Filled 2016-09-12 (×7): qty 2.5

## 2016-09-12 MED ORDER — ACETAMINOPHEN 325 MG PO TABS: 650.0000 mg | ORAL_TABLET | Freq: Four times a day (QID) | ORAL | Status: DC | PRN

## 2016-09-12 MED ORDER — GUAIFENESIN ER 600 MG PO TB12
600.0000 mg | ORAL_TABLET | Freq: Two times a day (BID) | ORAL | Status: DC
  Administered 2016-09-12 – 2016-09-14 (×5): 600 mg via ORAL
  Filled 2016-09-12 (×5): qty 1

## 2016-09-12 MED ORDER — INSULIN ASPART 100 UNIT/ML ~~LOC~~ SOLN
0.0000 [IU] | SUBCUTANEOUS | Status: DC
  Administered 2016-09-12: 5 [IU] via SUBCUTANEOUS

## 2016-09-12 MED ORDER — ONDANSETRON HCL 4 MG PO TABS: 4.0000 mg | ORAL_TABLET | Freq: Four times a day (QID) | ORAL | Status: DC | PRN

## 2016-09-12 MED ORDER — INSULIN ASPART 100 UNIT/ML ~~LOC~~ SOLN
0.0000 [IU] | SUBCUTANEOUS | Status: DC
  Administered 2016-09-12: 5 [IU] via SUBCUTANEOUS
  Administered 2016-09-13: 2 [IU] via SUBCUTANEOUS
  Administered 2016-09-13: 5 [IU] via SUBCUTANEOUS

## 2016-09-12 MED ORDER — SIMVASTATIN 40 MG PO TABS
40.0000 mg | ORAL_TABLET | Freq: Every day | ORAL | Status: DC
  Administered 2016-09-12 – 2016-09-15 (×4): 40 mg via ORAL
  Filled 2016-09-12 (×4): qty 1

## 2016-09-12 MED ORDER — APIXABAN 5 MG PO TABS: 5.0000 mg | ORAL_TABLET | Freq: Two times a day (BID) | ORAL | Status: DC

## 2016-09-12 MED ORDER — FLUCONAZOLE IN SODIUM CHLORIDE 100-0.9 MG/50ML-% IV SOLN
100.0000 mg | INTRAVENOUS | Status: DC
  Administered 2016-09-12: 100 mg via INTRAVENOUS
  Filled 2016-09-12 (×5): qty 50

## 2016-09-12 MED ORDER — MICONAZOLE NITRATE POWD
Freq: Two times a day (BID) | Status: DC
  Administered 2016-09-12 – 2016-09-16 (×7): via TOPICAL
  Filled 2016-09-12: qty 100

## 2016-09-12 MED ORDER — SODIUM CHLORIDE 0.9 % IV BOLUS (SEPSIS)
500.0000 mL | Freq: Once | INTRAVENOUS | Status: AC
  Administered 2016-09-12: 500 mL via INTRAVENOUS

## 2016-09-12 MED ORDER — HYDROCORTISONE NA SUCCINATE PF 100 MG IJ SOLR
100.0000 mg | Freq: Four times a day (QID) | INTRAMUSCULAR | Status: DC
  Administered 2016-09-12 – 2016-09-13 (×6): 100 mg via INTRAVENOUS
  Filled 2016-09-12 (×6): qty 2

## 2016-09-12 MED ORDER — ALBUTEROL SULFATE (2.5 MG/3ML) 0.083% IN NEBU
2.5000 mg | INHALATION_SOLUTION | RESPIRATORY_TRACT | Status: DC | PRN
  Administered 2016-09-13: 2.5 mg via RESPIRATORY_TRACT
  Filled 2016-09-12: qty 3

## 2016-09-12 MED ORDER — SODIUM CHLORIDE 0.9 % IV SOLN
INTRAVENOUS | Status: DC
  Administered 2016-09-12: 18:00:00 via INTRAVENOUS

## 2016-09-12 MED ORDER — ACETAMINOPHEN 650 MG RE SUPP: 650.0000 mg | Freq: Four times a day (QID) | RECTAL | Status: DC | PRN

## 2016-09-12 MED ORDER — ONDANSETRON HCL 4 MG/2ML IJ SOLN: 4.0000 mg | Freq: Four times a day (QID) | INTRAMUSCULAR | Status: DC | PRN

## 2016-09-12 MED ORDER — SODIUM CHLORIDE 0.9 % IV SOLN
INTRAVENOUS | Status: AC
  Administered 2016-09-12: via INTRAVENOUS

## 2016-09-12 MED ORDER — BOOST PLUS PO LIQD
237.0000 mL | Freq: Three times a day (TID) | ORAL | Status: DC
  Administered 2016-09-12 – 2016-09-16 (×10): 237 mL via ORAL
  Filled 2016-09-12 (×16): qty 237

## 2016-09-12 MED ORDER — LATANOPROST 0.005 % OP SOLN
1.0000 [drp] | Freq: Every day | OPHTHALMIC | Status: DC
  Administered 2016-09-12 – 2016-09-15 (×4): 1 [drp] via OPHTHALMIC
  Filled 2016-09-12 (×3): qty 2.5

## 2016-09-12 NOTE — ED Notes (Signed)
Incontinence care given as needed.  Repositioned for comfort.

## 2016-09-12 NOTE — Progress Notes (Signed)
Met w/ family at bedside.

## 2016-09-12 NOTE — Progress Notes (Signed)
CRITICAL VALUE ALERT  Critical Value:  Lactic Acid 4.3   Date & Time Notied:  09/12/16  2028  Provider Notified: Dr. Hal Hope  Orders Received/Actions taken: 554mL NS bolus, recheck lactic acid in 3 hours, orders placed

## 2016-09-12 NOTE — Evaluation (Signed)
Physical Therapy Evaluation Patient Details Name: Randall Odom MRN: [id number] DOB: [birth year] Today's Date: 09/12/2016   History of Present Illness  83 y.o. male from SNF with c/o confusion, SoB, decreased appetite, admitted for SIRS/sepsis dehydration resulting in acute renal failure. PMH of DM2, PPM, HTN, BPH, glaucoma, DJD, depression, CHB sp PPM, chronic combined CHF, CAD and atrial fibrillation   Clinical Impression  Pt admitted with above diagnosis. Pt currently with functional limitations due to the deficits listed below (see PT Problem List). Pt minA for bed mobility, transfers and ambulation of 3 feet with RW. Pt currently limited by fatigue and weakness. Pt will benefit from skilled PT to increase their independence and safety with mobility to allow discharge to the venue listed below.       Follow Up Recommendations SNF    Equipment Recommendations  None recommended by PT    Recommendations for Other Services OT consult     Precautions / Restrictions Precautions Precautions: Fall Precaution Comments: monitor sats Restrictions Weight Bearing Restrictions: No      Mobility  Bed Mobility Overal bed mobility: Needs Assistance Bed Mobility: Supine to Sit     Supine to sit: Min assist;HOB elevated     General bed mobility comments: minA for trunk to upright and pad scoot to EoB  Transfers Overall transfer level: Needs assistance Equipment used: Rolling walker (2 wheeled) Transfers: Sit to/from Stand Sit to Stand: Mod assist         General transfer comment: modA for power up, vc for hand placement  Ambulation/Gait Ambulation/Gait assistance: Min assist Ambulation Distance (Feet): 3 Feet Assistive device: Rolling walker (2 wheeled) Gait Pattern/deviations: Step-through pattern;Shuffle     General Gait Details: minA for steadying, pt began ambulation 2 feet and asked to sit down as he was tired.       Balance Overall balance assessment: Needs  assistance Sitting-balance support: No upper extremity supported;Feet supported Sitting balance-Leahy Scale: Fair     Standing balance support: Bilateral upper extremity supported Standing balance-Leahy Scale: Poor Standing balance comment: requires RW for balance                             Pertinent Vitals/Pain Pain Assessment: No/denies pain    Home Living Family/patient expects to be discharged to:: Skilled nursing facility                      Prior Function Level of Independence: Needs assistance   Gait / Transfers Assistance Needed: ambulates with SPC     Comments: no family present difficult to obtain        Extremity/Trunk Assessment   Upper Extremity Assessment Upper Extremity Assessment: Generalized weakness;Overall [facility] for tasks assessed    Lower Extremity Assessment Lower Extremity Assessment: Generalized weakness;Overall WFL for tasks assessed       Communication   Communication: HOH  Cognition Arousal/Alertness: Lethargic Behavior During Therapy: WFL for tasks assessed/performed Overall Cognitive Status: No family/caregiver present to determine baseline cognitive functioning                                 General Comments: thinks that he still lives at home but was admitted from SNF      General Comments General comments (skin integrity, edema, etc.): supine at rest BP 106/57, HR 75 bpm, and SaO2 on 2L O2 via nasal cannula 96%  O2, with activity on RA SaO2 dropped to 86%O2, seated after activity BP 126/55, HR 78 bpm and SaO2 on 2 L via nasal cannula  94% O2          Assessment/Plan    PT Assessment Patient needs continued PT services  PT Problem List Decreased strength;Decreased range of motion;Decreased activity tolerance;Decreased balance;Decreased mobility;Decreased cognition;Decreased safety awareness;Cardiopulmonary status limiting activity       PT Treatment Interventions DME instruction;Gait  training;Functional mobility training;Therapeutic activities;Therapeutic exercise;Balance training;Cognitive remediation;Patient/family education    PT Goals (Current goals can be found in the Care Plan section)  Acute Rehab PT Goals Patient Stated Goal: go home with his wife PT Goal Formulation: Patient unable to participate in goal setting Time For Goal Achievement: 09/19/16 Potential to Achieve Goals: Fair    Frequency Min 2X/week    AM-PAC PT "6 Clicks" Daily Activity  Outcome Measure Difficulty turning over in bed (including adjusting bedclothes, sheets and blankets)?: A Lot Difficulty moving from lying on back to sitting on the side of the bed? : A Lot Difficulty sitting down on and standing up from a chair with arms (e.g., wheelchair, bedside commode, etc,.)?: A Lot Help needed moving to and from a bed to chair (including a wheelchair)?: A Little Help needed walking in hospital room?: A Lot Help needed climbing 3-5 steps with a railing? : Total 6 Click Score: 12    End of Session Equipment Utilized During Treatment: Gait belt;Oxygen Activity Tolerance: Patient limited by fatigue Patient left: in chair;with call bell/phone within reach;with chair alarm set Nurse Communication: Mobility status PT Visit Diagnosis: Unsteadiness on feet (R26.81);Other abnormalities of gait and mobility (R26.89);Repeated falls (R29.6);Muscle weakness (generalized) (M62.81);Difficulty in walking, not elsewhere classified (R26.2)    Time: 1224-4975 PT Time Calculation (min) (ACUTE ONLY): 28 min   Charges:   PT Evaluation $PT Eval Moderate Complexity: 1 Procedure PT Treatments $Therapeutic Activity: 8-22 mins   PT G Codes:        Harvey Lingo B. Migdalia Dk PT, DPT Acute Rehabilitation  [phone] Pager [phone]    [place] 09/12/2016, 9:16 AM

## 2016-09-12 NOTE — Progress Notes (Signed)
PROGRESS NOTE    Randall Odom  RDE:[id number] DOB: 1926/04/02 DOA: 09/11/2016 PCP: Prince Solian, MD   Brief Narrative:   83 y.o. WM from [facility] PMHx Anxiety,Depressive disorder, not elsewhere classified, Dementia,Chronic combined systolic and diastolic heart failure, Chronic Atrial fibrillation, CAD, Complete heart block S/P pacemaker,HTN, HLD, DM Type 2, Rt superior segment mass, Thoracic or lumbosacral neuritis or radiculitis, unspecified  Presented with increased fatigue and decreased by mouth intake patient has baseline dementia unclear baseline, he was found to be lethargic only oriented to person and place Decreased by mouth intake his labs were noted to have evidence of acute renal failure he was hypothermic down to 96.4 with evaluated white blood cell count the setting of recent steroid use. Poor by mouth intake for the past few days is evidence of thrash in his  Mouth. Patient herself unable to provide detailed history  Denies any pain no chest pain no constipation or diarrhea.  Regarding pertinent Chronic problems: Chronic A.fib on eliquis Patient has chronic cavitary lesion of the right lower lobe PET scan in 2015 showed low uptake repeat CT scan showed no change. Patient has chronic respiratory failure. History of combined systolic diastolic CHF on Lasix last echogram 22nd of June 2018 LV EF: 40% - 45% Akinesis of the mid-apical anteroseptal, inferoseptal, and apical myocardium. Hypokinesis of the anterior myocardium. unchanged from prior Patient has been admitted from 21st at 25th of June 2018 for community-acquired pneumonia and discharged as recommended SNF but declined and went home with PT and home oxygen during admission history disease with IV steroids and empirically IV antibiotics as well as diuretics of some improvement he was discharge on oral antibiotics and steroids complete steroid taper at home   Subjective:  7/5  A/O 1 (does not know where, when,  why), very pleasant follows all commands Overnight patient hypothermic T minimum 35.8C   Assessment & Plan:   Active Problems:   Diabetes mellitus (HCC)   Pacemaker   CAD (coronary artery disease)   Acute renal failure (HCC)   Dehydration   Permanent atrial fibrillation (HCC)   Essential hypertension   Chronic combined systolic and diastolic heart failure (HCC)   Chronic anticoagulation   Chronic respiratory failure with hypoxia (HCC)   Dementia   Acute renal failure (ARF) (HCC)   Sepsis (HCC)   Leukocytosis   Hematuria   Hyponatremia   Thrush  Sepsis unspecified organism -unclear source but meeting sepsis criteria given hypothermia and elevated white blood cell count given recent admission -continue broad-spectrum antibiotics -Continue lactic acidosis -Continue stress dose steroids Solu-Cortef 100 mg QID -Continue lactic acidosis -Bolus normal saline 500 mL, then continue normal saline at 63ml/hr  Thrush of mouth and esophagus/candidal skin infection/Intertrigo -Diflucan 100 mg IV -Miconazole powder BID  Chronic systolic CHF -Strict I&O -Daily weight -Monitor closely for fluid overload  Complete heart block -S/P pacer -  Chronic atrial fibrillation(CHA2DS2 vas score 6) -Currently rate controlled -Continue Eliquis  Essential hypertension -Hold ACE I given renal failure  CAD   Chronic respiratory failure with hypoxia (baseline 2 L O2 at home?) -Titrate to maintain SPO2 89-93%  Acute renal failure (baseline Cr= 1.0) -Hold all nephrotoxic agents -Hold ACE I Lab Results  Component Value Date   CREATININE 2.88 (H) 09/12/2016   CREATININE 3.63 (H) 09/11/2016   CREATININE 3.31 (H) 09/10/2016  -Improving with hydration  Dementia -Appears to be severe. Very pleasant unknown baseline  Hematuria/UTI? -potentially representing evolving staghorn calculi  -Urine culture pending -  Current antibiotics would treat most UTI -Consider outpatient urology  appointment for further workup if required  DM type II uncontrolled without complication -Moderate SSI  Hypothermia -Bear hugger      DVT prophylaxis: Epixiban Code Status: Full Family Communication: None Disposition Plan: Resolution sepsis   Consultants:  None     Procedures/Significant Events:  6/22 Echocardiogram:Left ventricle: moderate concentric hypertrophy. -LVEF =40% to 45%. Akinesis of the mid-apical anteroseptal,  inferoseptal, and apical myocardium. Hypokinesis of the anterior myocardium.  7/4 CT abdomen pelvis WO contrast:-Partial resolution of bilateral lower lobe pulmonary consolidations since prior CT chest. - Scattered colonic diverticulosis w/o acute diverticulitis. -Bilateral renal calculi, new since 02/18/2010 potentially representing evolving staghorn calculi with mild caliectasis. Given perinephric fat stranding, correlate to exclude urinary tract infection. Stable right upper pole 2.8 cm renal simple cyst.      VENTILATOR SETTINGS:    Cultures 7/4 blood NGTD 7/4 urine pending 7/5 MRSA by PCR negative    Antimicrobials: Anti-infectives    Start     Stop   09/13/16 1900  vancomycin (VANCOCIN) IVPB 1000 mg/200 mL premix         09/12/16 0100  piperacillin-tazobactam (ZOSYN) IVPB 2.25 g         09/11/16 1900  piperacillin-tazobactam (ZOSYN) IVPB 3.375 g     09/11/16 2004   09/11/16 1900  vancomycin (VANCOCIN) IVPB 1000 mg/200 mL premix     09/11/16 2033       Devices None  LINES / TUBES:      Continuous Infusions: . sodium chloride 75 mL/hr at 09/12/16 0010  . piperacillin-tazobactam (ZOSYN)  IV Stopped (09/12/16 0206)  . [START ON 09/13/2016] vancomycin       Objective: Vitals:   09/12/16 0515 09/12/16 0545 09/12/16 0621 09/12/16 0721  BP: (!) 113/59 99/61 (!) 129/56 (!) 104/53  Pulse: 78 74 77 75  Resp: 18 12 15 13   Temp:    (!) 97.5 F (36.4 C)  TempSrc:    Oral  SpO2: 98% 96% 97% 95%  Weight:      Height:          Intake/Output Summary (Last 24 hours) at 09/12/16 0748 Last data filed at 09/12/16 0206  Gross per 24 hour  Intake             3800 ml  Output              300 ml  Net             3500 ml   Filed Weights   09/11/16 2017  Weight: 197 lb (89.4 kg)    Examination:  General: A/O 1 (does not know where, when, why), very pleasant follows all commands, positive chronic respiratory distress Eyes: negative scleral hemorrhage, negative anisocoria, negative icterus ENT: Negative Runny nose, negative gingival bleeding, positive thrush Neck:  Negative scars, masses, torticollis, lymphadenopathy, JVD Lungs: Clear to auscultation bilaterally without wheezes or crackles Cardiovascular: Regular rate and rhythm without murmur gallop or rub normal S1 and S2 Abdomen: negative abdominal pain, nondistended, positive soft, bowel sounds, no rebound, no ascites, no appreciable mass Extremities: No significant cyanosis, clubbing, or edema bilateral lower extremities Skin: Negative rashes, lesions, ulcers Psychiatric:  Patient very pleasant but unable to complete evaluation secondary to dementia  Central nervous system:  Cranial nerves II through XII intact, tongue/uvula midline, all extremities muscle strength 5/5, sensation intact throughout, negative dysarthria, negative expressive aphasia, negative receptive aphasia.  .     Data Reviewed:  Care during the described time interval was provided by me .  I have reviewed this patient's available data, including medical history, events of note, physical examination, and all test results as part of my evaluation. I have personally reviewed and interpreted all radiology studies.  CBC:  Recent Labs Lab 09/10/16 0919 09/11/16 1754 09/12/16 0406  WBC 31.8* 29.7* 21.8*  NEUTROABS 25.7* 27.0*  --   HGB 14.9 15.6 13.0  HCT 45.0 45.6 39.6  MCV 92.6 92.1 92.1  PLT 214.0 178 621*   Basic Metabolic Panel:  Recent Labs Lab 09/10/16 0919 09/11/16 1754  09/12/16 0406  NA 134* 131* 136  K 4.3 4.5 4.6  CL 95* 93* 104  CO2 28 24 20*  GLUCOSE 218* 267* 215*  BUN 90* 101* 85*  CREATININE 3.31* 3.63* 2.88*  CALCIUM 10.0 10.1 8.4*  MG  --  2.6* 2.1  PHOS  --   --  5.0*   GFR: Estimated Creatinine Clearance: 19.6 mL/min (A) (by C-G formula based on SCr of 2.88 mg/dL (H)). Liver Function Tests:  Recent Labs Lab 09/10/16 0919 09/11/16 1754 09/12/16 0406  AST 22 31 24   ALT 32 36 22  ALKPHOS 68 82 63  BILITOT 0.9 1.2 1.3*  PROT 6.3 6.4* 4.9*  ALBUMIN 3.2* 3.3* 2.5*   No results for input(s): LIPASE, AMYLASE in the last 168 hours. No results for input(s): AMMONIA in the last 168 hours. Coagulation Profile: No results for input(s): INR, PROTIME in the last 168 hours. Cardiac Enzymes: No results for input(s): CKTOTAL, CKMB, CKMBINDEX, TROPONINI in the last 168 hours. BNP (last 3 results)  Recent Labs  09/10/16 0919  PROBNP 30.0   HbA1C: No results for input(s): HGBA1C in the last 72 hours. CBG: No results for input(s): GLUCAP in the last 168 hours. Lipid Profile: No results for input(s): CHOL, HDL, LDLCALC, TRIG, CHOLHDL, LDLDIRECT in the last 72 hours. Thyroid Function Tests:  Recent Labs  09/11/16 1938  TSH 1.192   Anemia Panel: No results for input(s): VITAMINB12, FOLATE, FERRITIN, TIBC, IRON, RETICCTPCT in the last 72 hours. Urine analysis:    Component Value Date/Time   COLORURINE YELLOW 09/11/2016 1936   APPEARANCEUR HAZY (A) 09/11/2016 1936   LABSPEC 1.014 09/11/2016 1936   PHURINE 5.0 09/11/2016 1936   GLUCOSEU 50 (A) 09/11/2016 1936   HGBUR LARGE (A) 09/11/2016 1936   BILIRUBINUR NEGATIVE 09/11/2016 1936   KETONESUR NEGATIVE 09/11/2016 1936   PROTEINUR NEGATIVE 09/11/2016 1936   UROBILINOGEN 0.2 05/26/2012 0623   NITRITE NEGATIVE 09/11/2016 1936   LEUKOCYTESUR TRACE (A) 09/11/2016 1936   Sepsis Labs: @LABRCNTIP (procalcitonin:4,lacticidven:4)  ) Recent Results (from the past 240 hour(s))  Acid  Fast Smear (AFB)     Status: None   Collection Time: 09/02/16  9:16 AM  Result Value Ref Range Status   AFB Specimen Processing Concentration  Final   Acid Fast Smear Negative  Final    Comment: (NOTE) Performed At: [facility] [place], [place] [id number] Lindon Romp MD NG:[id number]    Source (AFB) SPUTUM  Final         Radiology Studies: Ct Abdomen Pelvis Wo Contrast  Result Date: 09/11/2016 CLINICAL DATA:  Abdominal pain and sepsis of unknown etiology EXAM: CT ABDOMEN AND PELVIS WITHOUT CONTRAST TECHNIQUE: Multidetector CT imaging of the abdomen and pelvis was performed following the standard protocol without IV contrast. COMPARISON:  Chest CT from 08/29/2016 and CT AP from 02/18/2010 FINDINGS: Lower chest: Partially resolves lower lobe  pulmonary consolidations and effusions with some residual streaky parenchymal areas of scarring and/or atelectasis remaining. There is coronary arteriosclerosis with right atrial and right ventricular pacing leads noted. Included heart is top-normal in size. There is no pericardial effusion. Hepatobiliary: No focal liver abnormality is seen. No gallstones, gallbladder wall thickening, or biliary dilatation. Pancreas: Atrophic appearing pancreas without ductal dilatation or mass. Spleen: No splenomegaly or focal lesions. Adrenals/Urinary Tract: Normal bilateral adrenal glands. Bilateral nonobstructing renal calculi with exophytic stable cyst off the interpolar right posterior cortex measuring 2.8 cm in diameter. The renal calculi. Medially there is mild caliectasis bilaterally. The largest renal calculus on the left is approximately 9 mm in length and on the right 14 mm. Nonspecific perinephric fat stranding is seen bilaterally which can be seen in the setting of urinary tract infections. Stomach/Bowel: The stomach is physiologically distended with normal small bowel rotation. Small epiphrenic nodular focus is stable measuring 13 mm  in short axis and may represent a ephrenic diverticulum. This appears stable. Differential possibility might include an adjacent lymph node. Duodenal diverticula again noted. Small and large bowel loops demonstrate no obstruction or inflammation. The appendix is surgically absent. Minimal diverticulosis along the distal descending and sigmoid colon. Vascular/Lymphatic: Aortoiliac and branch vessel atherosclerosis without aneurysm. No pathologically enlarged appearing lymph nodes. Reproductive: Prostate and seminal vesicles are within normal limits. Other: No abdominal wall hernia or abnormality. No abdominopelvic ascites. Musculoskeletal: Degenerative disc disease L5-S1 with grade 1 bordering on grade 2 spondylolisthesis of L5 on S1 secondary to bilateral L5 pars defects. No acute or suspicious osseous abnormality. IMPRESSION: 1. Partial resolution of bilateral lower lobe pulmonary consolidations since prior CT chest. 2. Scattered colonic diverticulosis along the distal descending sigmoid colon without acute diverticulitis. 3. Bilateral renal calculi, new since 02/18/2010 potentially representing evolving staghorn calculi with mild caliectasis. Given perinephric fat stranding, correlate to exclude urinary tract infection. Stable right upper pole 2.8 cm renal simple cyst. 4. Aortoiliac and branch vessel atherosclerosis. 5. Chronic L5 pars defects with grade 1 bordering on grade 2 spondylolisthesis of L5 on S1. Electronically Signed   By: Ashley Royalty M.D.   On: 09/11/2016 20:54   Dg Chest 2 View  Result Date: 09/11/2016 CLINICAL DATA:  Pt reports to the ED for eval of abnormal lab. Per EMS his BUN is elevated at 100. Pt reports generalized weakness "for a while". Hx of DM and HTN. Former smoker EXAM: CHEST  2 VIEW COMPARISON:  09/10/2016 FINDINGS: Right-sided transvenous pacemaker leads overlie the right atrium and right ventricle. An orphan lead overlies the right anterior chest. Heart size is normal. There is  bibasilar atelectasis or scarring. There are no focal consolidations. Small left pleural effusion is noted. No pulmonary edema. IMPRESSION: 1. Scarring or atelectasis at the bases, stable in appearance. 2.  No evidence for acute  abnormality. Electronically Signed   By: Nolon Nations M.D.   On: 09/11/2016 18:31   Dg Chest 2 View  Result Date: 09/10/2016 CLINICAL DATA:  Dyspnea on exertion.  History of pneumonia. EXAM: CHEST  2 VIEW COMPARISON:  09/02/2016 FINDINGS: Hyperinflation. Accentuation of expected thoracic kyphosis with mild loss of vertebral body height at mid thoracic levels. Pacer/AICD device. Midline trachea. The Chin overlies the apices. Normal heart size. Tortuous thoracic aorta. Atherosclerosis in the transverse aorta. Probable trace left pleural fluid. No pneumothorax. Improved bibasilar aeration, with patchy left base airspace disease and minimal probable right base scarring remaining. No new pulmonary opacity. IMPRESSION: Overall improved bibasilar aeration. Mild left  base pulmonary opacity remains, favored to represent atelectasis. Residual pneumonia cannot be entirely excluded. No new superimposed process. Aortic Atherosclerosis (ICD10-I70.0). Electronically Signed   By: Abigail Miyamoto M.D.   On: 09/10/2016 09:49        Scheduled Meds: . apixaban  2.5 mg Oral BID  . guaiFENesin  600 mg Oral BID  . hydrocortisone sodium succinate  100 mg Intravenous Q6H  . ipratropium  0.5 mg Nebulization Q6H  . latanoprost  1 drop Both Eyes QHS  . magic mouthwash  5 mL Oral TID  . simvastatin  40 mg Oral QHS  . sodium chloride flush  3 mL Intravenous Q12H   Continuous Infusions: . sodium chloride 75 mL/hr at 09/12/16 0010  . piperacillin-tazobactam (ZOSYN)  IV Stopped (09/12/16 0206)  . [START ON 09/13/2016] vancomycin       LOS: 1 day    Time spent: 40 minutes    WOODS, Geraldo Docker, MD [facility] Pager [phone]   If 7PM-7AM, please contact  night-coverage [email or web address] Password [facility] 09/12/2016, 7:48 AM

## 2016-09-12 NOTE — Progress Notes (Addendum)
Initial Nutrition Assessment  DOCUMENTATION CODES:   Not applicable  INTERVENTION:   Boost Plus PO TID, each supplement provides 360 kcal and 14 gm protein  NUTRITION DIAGNOSIS:   Inadequate oral intake related to poor appetite as evidenced by per patient/family report.  GOAL:   Patient will meet greater than or equal to 90% of their needs  MONITOR:   PO intake, Supplement acceptance  REASON FOR ASSESSMENT:   Consult  (malnutrition)  ASSESSMENT:   83 yo male with PMH of dementia, CHF, CAD, pacemaker, DM 2, HTN who was admitted on 7/4 with SIRS/sepsis, decreased PO intake,  and dehydration with acute renal failure.   Patient reports poor memory; unable to state how he has been eating or his usual weight. He drank chocolate Boost supplements PTA, but hasn't had any lately.  Per chart review, patient has thrush in mouth, which is likely causing poor oral intake. Per discussion with RN, patient only drank the liquids on his breakfast tray today, he did not eat anything. Nutrition-Focused physical exam completed. Findings are no fat depletion, no muscle depletion, and mild edema.  Patient has lost ~10% of his usual weight over the past 2 months. Labs and medications reviewed.  Diet Order:  Diet Carb Modified Fluid consistency: Thin; Room service appropriate? Yes  Skin:  Reviewed, no issues  Last BM:  unknown  Height:   Ht Readings from Last 1 Encounters:  09/11/16 5\' 9"  (1.753 m)    Weight:   Wt Readings from Last 1 Encounters:  09/11/16 197 lb (89.4 kg)    Ideal Body Weight:  72.7 kg  BMI:  Body mass index is 29.09 kg/m.  Estimated Nutritional Needs:   Kcal:  1900-2100  Protein:  90-110 gm  Fluid:  1.9-2.1 L  EDUCATION NEEDS:   No education needs identified at this time  Molli Barrows, [place], [place], [place] Pager [phone] After Hours Pager [phone]

## 2016-09-12 NOTE — ED Notes (Signed)
Attempted report at this time.  Nurse to call back when available. 

## 2016-09-12 NOTE — Progress Notes (Signed)
Inpatient Diabetes Program Recommendations  AACE/ADA: New Consensus Statement on Inpatient Glycemic Control (2015)  Target Ranges:  Prepandial:   less than 140 mg/dL      Peak postprandial:   less than 180 mg/dL (1-2 hours)      Critically ill patients:  140 - 180 mg/dL   Lab Results  Component Value Date   GLUCAP 190 (H) 09/02/2016   HGBA1C 7.5 (H) 08/30/2016   Results for HALE, CHALFIN (MRN [id number]) as of 09/12/2016 09:55  Ref. Range 09/10/2016 09:19 09/11/2016 17:54 09/12/2016 04:06  Glucose Latest Ref Range: 65 - 99 mg/dL 218 (H) 267 (H) 215 (H)   Diabetes history: Diabetes Mellitus Outpatient Diabetes medications: Glipizide 5 mg with breakfast, Novolog correction 4 times a day Current orders for Inpatient glycemic control:  None-Solucortef 100 mg q 6 hours  Inpatient Diabetes Program Recommendations:   Please consider adding Novolog sensitive tid with meals and HS.   Thanks, Adah Perl, RN, BC-ADM Inpatient Diabetes Coordinator Pager [phone] (8a-5p)

## 2016-09-13 ENCOUNTER — Inpatient Hospital Stay (HOSPITAL_COMMUNITY): Payer: Medicare Other

## 2016-09-13 LAB — URINE CULTURE: Culture: 50000 — AB

## 2016-09-13 LAB — BASIC METABOLIC PANEL
Anion gap: 10 (ref 5–15)
BUN: 66 mg/dL — AB (ref 6–20)
CO2: 19 mmol/L — ABNORMAL LOW (ref 22–32)
CREATININE: 2.21 mg/dL — AB (ref 0.61–1.24)
Calcium: 8 mg/dL — ABNORMAL LOW (ref 8.9–10.3)
Chloride: 107 mmol/L (ref 101–111)
GFR calc Af Amer: 29 mL/min — ABNORMAL LOW (ref >60)
GFR, EST NON AFRICAN AMERICAN: 25 mL/min — AB (ref >60)
GLUCOSE: 218 mg/dL — AB (ref 65–99)
POTASSIUM: 4.1 mmol/L (ref 3.5–5.1)
Sodium: 136 mmol/L (ref 135–145)

## 2016-09-13 LAB — HEMOGLOBIN A1C
HEMOGLOBIN A1C: 8 % — AB (ref 4.8–5.6)
MEAN PLASMA GLUCOSE: 183 mg/dL

## 2016-09-13 LAB — GLUCOSE, CAPILLARY
GLUCOSE-CAPILLARY: 135 mg/dL — AB (ref 65–99)
GLUCOSE-CAPILLARY: 70 mg/dL (ref 65–99)
Glucose-Capillary: 208 mg/dL — ABNORMAL HIGH (ref 65–99)
Glucose-Capillary: 304 mg/dL — ABNORMAL HIGH (ref 65–99)
Glucose-Capillary: 315 mg/dL — ABNORMAL HIGH (ref 65–99)

## 2016-09-13 LAB — MAGNESIUM: Magnesium: 1.9 mg/dL (ref 1.7–2.4)

## 2016-09-13 LAB — LACTIC ACID, PLASMA
LACTIC ACID, VENOUS: 1.8 mmol/L (ref 0.5–1.9)
LACTIC ACID, VENOUS: 2.8 mmol/L — AB (ref 0.5–1.9)

## 2016-09-13 MED ORDER — PIPERACILLIN-TAZOBACTAM 3.375 G IVPB
3.3750 g | Freq: Three times a day (TID) | INTRAVENOUS | Status: DC
  Filled 2016-09-13: qty 50

## 2016-09-13 MED ORDER — SODIUM CHLORIDE 0.9 % IV BOLUS (SEPSIS)
500.0000 mL | Freq: Once | INTRAVENOUS | Status: AC
  Administered 2016-09-13: 500 mL via INTRAVENOUS

## 2016-09-13 MED ORDER — VANCOMYCIN HCL IN DEXTROSE 1-5 GM/200ML-% IV SOLN
1000.0000 mg | INTRAVENOUS | Status: DC
  Administered 2016-09-13: 1000 mg via INTRAVENOUS
  Filled 2016-09-13: qty 200

## 2016-09-13 MED ORDER — IPRATROPIUM-ALBUTEROL 0.5-2.5 (3) MG/3ML IN SOLN: 3.0000 mL | Freq: Three times a day (TID) | RESPIRATORY_TRACT | Status: DC

## 2016-09-13 MED ORDER — INSULIN ASPART 100 UNIT/ML ~~LOC~~ SOLN
0.0000 [IU] | Freq: Every day | SUBCUTANEOUS | Status: DC
  Administered 2016-09-13: 4 [IU] via SUBCUTANEOUS

## 2016-09-13 MED ORDER — FLUCONAZOLE 100 MG PO TABS
100.0000 mg | ORAL_TABLET | Freq: Every evening | ORAL | Status: DC
  Administered 2016-09-13 – 2016-09-15 (×3): 100 mg via ORAL
  Filled 2016-09-13 (×3): qty 1

## 2016-09-13 MED ORDER — INSULIN ASPART 100 UNIT/ML ~~LOC~~ SOLN
0.0000 [IU] | Freq: Three times a day (TID) | SUBCUTANEOUS | Status: DC
  Administered 2016-09-13 – 2016-09-14 (×2): 7 [IU] via SUBCUTANEOUS
  Administered 2016-09-14: 3 [IU] via SUBCUTANEOUS

## 2016-09-13 MED ORDER — DEXTROSE 5 % IV SOLN
1.0000 g | INTRAVENOUS | Status: DC
  Administered 2016-09-13 – 2016-09-15 (×3): 1 g via INTRAVENOUS
  Filled 2016-09-13 (×4): qty 10

## 2016-09-13 MED ORDER — HYDROCORTISONE NA SUCCINATE PF 100 MG IJ SOLR
50.0000 mg | Freq: Two times a day (BID) | INTRAMUSCULAR | Status: DC
  Administered 2016-09-13 – 2016-09-14 (×2): 50 mg via INTRAVENOUS
  Filled 2016-09-13 (×2): qty 2

## 2016-09-13 NOTE — Progress Notes (Signed)
Inpatient Diabetes Program Recommendations  AACE/ADA: New Consensus Statement on Inpatient Glycemic Control (2015)  Target Ranges:  Prepandial:   less than 140 mg/dL      Peak postprandial:   less than 180 mg/dL (1-2 hours)      Critically ill patients:  140 - 180 mg/dL   Lab Results  Component Value Date   GLUCAP 70 09/13/2016   HGBA1C 8.0 (H) 09/12/2016    Review of Glycemic ControlResults for Randall Odom, Randall Odom (MRN [id number]) as of 09/13/2016 11:31  Ref. Range 09/12/2016 15:20 09/12/2016 19:48 09/12/2016 23:02 09/13/2016 03:51 09/13/2016 07:59  Glucose-Capillary Latest Ref Range: 65 - 99 mg/dL 259 (H) 219 (H) 244 (H) 135 (H) 70   Diabetes history: Type 2 diabetes Outpatient Diabetes medications: Glucotrol 5 mg daily, Novolog correction tid with meals Current orders for Inpatient glycemic control:  Novolog moderate q 4 hours  Inpatient Diabetes Program Recommendations:   Please consider reducing Novolog correction to moderate tid with meals and HS.  Thanks, Adah Perl, RN, BC-ADM Inpatient Diabetes Coordinator Pager [phone] (8a-5p)

## 2016-09-13 NOTE — Evaluation (Signed)
Occupational Therapy Evaluation Patient Details Name: Randall Odom MRN: [id number] DOB: Sep 11, 1926 Today's Date: 09/13/2016    History of Present Illness 83 y.o. male from SNF with c/o confusion, SoB, decreased appetite, admitted for SIRS/sepsis dehydration resulting in acute renal failure. PMH of DM2, PPM, HTN, BPH, glaucoma, DJD, depression, CHB sp PPM, chronic combined CHF, CAD and atrial fibrillation    Clinical Impression   Pt admitted with above.  He demonstrates the below listed deficits.  He requires mod A for ADLs and min A for functional transfers.  He fatigues quickly with activity.  02 sats decreased to 82% on RA, but quickly returned to mid 90s on 2L supplemental 02.  He has been at [facility] for rehab.  Recommend return to SNF for continued rehab to allow him to maximize safety and independence with ADLs and reduce burden of care.   Acute OT will sign off at this time as all further OT needs can be addressed by SNF OTs.      Follow Up Recommendations  SNF    Equipment Recommendations  None recommended by OT    Recommendations for Other Services       Precautions / Restrictions Precautions Precautions: Fall Precaution Comments: monitor sats      Mobility Bed Mobility Overal bed mobility: Needs Assistance Bed Mobility: Supine to Sit     Supine to sit: Min assist;HOB elevated     General bed mobility comments: min A to lift trunk from bed   Transfers Overall transfer level: Needs assistance Equipment used: Rolling walker (2 wheeled)   Sit to Stand: Min assist         General transfer comment: min A to move into standing, to maneuver walker and for safety     Balance Overall balance assessment: Needs assistance Sitting-balance support: No upper extremity supported;Feet supported Sitting balance-Leahy Scale: Fair     Standing balance support: Bilateral upper extremity supported Standing balance-Leahy Scale: Poor Standing balance comment: reliant on UE  support                            ADL either performed or assessed with clinical judgement   ADL Overall ADL's : Needs assistance/impaired Eating/Feeding: Independent   Grooming: Wash/dry hands;Wash/dry face;Oral care;Brushing hair;Minimal assistance;Sitting   Upper Body Bathing: Moderate assistance;Sitting   Lower Body Bathing: Moderate assistance;Sit to/from stand   Upper Body Dressing : Moderate assistance;Sitting   Lower Body Dressing: Moderate assistance;Sit to/from stand   Toilet Transfer: Minimal assistance;Stand-pivot;BSC;RW   Toileting- Water quality scientist and Hygiene: Sit to/from stand;Moderate assistance       Functional mobility during ADLs: Minimal assistance;Rolling walker General ADL Comments: Pt moves very slowly.  Requires assist for initiation and thoroughness as well as for balance      Vision         Perception     Praxis      Pertinent Vitals/Pain Pain Assessment: No/denies pain     Hand Dominance Left   Extremity/Trunk Assessment Upper Extremity Assessment Upper Extremity Assessment: Generalized weakness   Lower Extremity Assessment Lower Extremity Assessment: Defer to PT evaluation   Cervical / Trunk Assessment Cervical / Trunk Assessment: Lordotic   Communication Communication Communication: HOH   Cognition Arousal/Alertness: Awake/alert Behavior During Therapy: WFL for tasks assessed/performed Overall Cognitive Status: No family/caregiver present to determine baseline cognitive functioning  General Comments: h/o dementia    General Comments  02 sats decreased to 82% on RA with activity, quickly returned to mid 90s on 2L supplemental 02     Exercises     Shoulder Instructions      Home Living Family/patient expects to be discharged to:: Skilled nursing facility                                 Additional Comments: Pt has been at [facility] for  rehab.  Prior to that lived with wife       Prior Functioning/Environment Level of Independence: Needs assistance  Gait / Transfers Assistance Needed: ambulates with RW and therapy assist  ADL's / Homemaking Assistance Needed: requires assist, but unable to tell me how much assist    Comments: no family present for confirmation         OT Problem List: Decreased strength;Decreased activity tolerance;Impaired balance (sitting and/or standing);Decreased cognition;Decreased safety awareness;Decreased knowledge of use of DME or AE;Cardiopulmonary status limiting activity      OT Treatment/Interventions:      OT Goals(Current goals can be found in the care plan section) Acute Rehab OT Goals Patient Stated Goal: did not state  OT Goal Formulation: All assessment and education complete, DC therapy  OT Frequency:     Barriers to D/C: Decreased caregiver support          Co-evaluation              AM-PAC PT "6 Clicks" Daily Activity     Outcome Measure Help from another person eating meals?: None Help from another person taking care of personal grooming?: A Little Help from another person toileting, which includes using toliet, bedpan, or urinal?: A Lot Help from another person bathing (including washing, rinsing, drying)?: A Lot Help from another person to put on and taking off regular upper body clothing?: A Lot Help from another person to put on and taking off regular lower body clothing?: A Lot 6 Click Score: 15   End of Session Equipment Utilized During Treatment: Rolling walker;Oxygen Nurse Communication: Mobility status  Activity Tolerance: Patient limited by fatigue Patient left: in chair;with call bell/phone within reach;with chair alarm set  OT Visit Diagnosis: Unsteadiness on feet (R26.81);Cognitive communication deficit (R41.841)                Time: 1884-1660 OT Time Calculation (min): 20 min Charges:  OT General Charges $OT Visit: 1 Procedure OT  Evaluation $OT Eval Moderate Complexity: 1 Procedure G-Codes:     Lucille Passy, OTR/L [phone]   Lucille Passy M 09/13/2016, 12:35 PM

## 2016-09-13 NOTE — Progress Notes (Signed)
Physician notified: Thereasa Solo At: 1100  Regarding: Sepsis pt with CHF hx. posterior crackles noted BLL and BUE edema noted. NS at 75.

## 2016-09-13 NOTE — Progress Notes (Signed)
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  He was found to be in acute renal failure and was hypothermic to 96.4 with evaluated WBCs.  Subjective: The patient is sitting up in a bedside chair.  He denies chest pain shortness breath fevers chills nausea vomiting or abdominal pain.  He is very pleasant and interactive.  Assessment & Plan:  Sepsis - hypothermia CXR w/o evidence of acute infiltrate - UA suggestive of potential UTI as well as nephrolithiasis - urine culture not helpful - narrow abx to cover UTI pathogens and follow clinically   Chronic hypoxic respiratory failure 2 L nasal cannula O2 at home baseline - wean O2 as able - no acute distress presently   Thrush  Cont diflucan   Acute kidney failure Likely due to prerenal azotemia - cont to hydrate and follow trend - improving   Recent Labs Lab 09/10/16 0919 09/11/16 1754 09/12/16 0406 09/12/16 2359  CREATININE 3.31* 3.63* 2.88* 2.21*    Chronic combined systolic and diastolic CHF EF 00-86 % via TTE June 2018 - no evidence of signif volume overload at present - follow Is/Os and daily weights - recent baseline weight appears to be 94-95 kilograms Filed Weights   09/11/16 2017 09/12/16 1642 09/13/16 0600  Weight: 89.4 kg (197 lb) 93.7 kg (206 lb 9.6 oz) 95.6 kg (210 lb 12.2 oz)    Hematuria - ?  UTI Has signif B kidney stones/staghorn - cont empiric abx   Chronic Afib on eliquis CHA2DS2 - VASc is 6 - presently rate controlled - continue anticoagulation  Dementia No agitation at present  CHB s/p  pacer  HTN Blood pressure currently well controlled  DM2 A1c 8.0 - CBG presently controlled - follow trend  DVT prophylaxis: eliquis Code Status: FULL CODE Family Communication: no family present at time of exam  Disposition Plan: PT/OT - ambulate   Consultants:  none  Procedures: none  Antimicrobials:  Zosyn 7/4 > 7/6 Vancomycin 7/4 > 7/6 Rocephin 7/6 >  Objective: Blood pressure 121/77, pulse 86, temperature (!) 97.5 F (36.4 C), temperature source Oral, resp. rate 13, height 5\' 9"  (1.753 m), weight 95.6 kg (210 lb 12.2 oz), SpO2 98 %.  Intake/Output Summary (Last 24 hours) at 09/13/16 1156 Last data filed at 09/13/16 0807  Gross per 24 hour  Intake          1398.33 ml  Output              175 ml  Net          1223.33 ml   Filed Weights   09/11/16 2017 09/12/16 1642 09/13/16 0600  Weight: 89.4 kg (197 lb) 93.7 kg (206 lb 9.6 oz) 95.6 kg (210 lb 12.2 oz)    Examination: General: No acute respiratory distress Lungs: Clear to auscultation bilaterally without wheezes or crackles Cardiovascular: Irreg irreg - no  Abdomen: Nontender, nondistended, soft, bowel sounds positive, no rebound, no ascites, no appreciable mass Extremities: No significant cyanosis, clubbing, or edema bilateral lower extremities  CBC:  Recent Labs Lab 09/10/16 0919 09/11/16 1754 09/12/16 0406  WBC 31.8*  29.7* 21.8*  NEUTROABS 25.7* 27.0*  --   HGB 14.9 15.6 13.0  HCT 45.0 45.6 39.6  MCV 92.6 92.1 92.1  PLT 214.0 178 829*   Basic Metabolic Panel:  Recent Labs Lab 09/10/16 0919 09/11/16 1754 09/12/16 0406 09/12/16 2359  NA 134* 131* 136 136  K 4.3 4.5 4.6 4.1  CL 95* 93* 104 107  CO2 28 24 20* 19*  GLUCOSE 218* 267* 215* 218*  BUN 90* 101* 85* 66*  CREATININE 3.31* 3.63* 2.88* 2.21*  CALCIUM 10.0 10.1 8.4* 8.0*  MG  --  2.6* 2.1 1.9  PHOS  --   --  5.0*  --    GFR: Estimated Creatinine Clearance: 26.4 mL/min (A) (by C-G formula based on SCr of 2.21 mg/dL (H)).  Liver  Function Tests:  Recent Labs Lab 09/10/16 0919 09/11/16 1754 09/12/16 0406  AST 22 31 24   ALT 32 36 22  ALKPHOS 68 82 63  BILITOT 0.9 1.2 1.3*  PROT 6.3 6.4* 4.9*  ALBUMIN 3.2* 3.3* 2.5*    Coagulation Profile:  Recent Labs Lab 09/12/16 1415  INR 1.33    HbA1C: Hgb A1c MFr Bld  Date/Time Value Ref Range Status  09/12/2016 02:15 PM 8.0 (H) 4.8 - 5.6 % Final    Comment:    (NOTE)         Pre-diabetes: 5.7 - 6.4         Diabetes: >6.4         Glycemic control for adults with diabetes: <7.0   08/30/2016 09:36 AM 7.5 (H) 4.8 - 5.6 % Final    Comment:    (NOTE)         Pre-diabetes: 5.7 - 6.4         Diabetes: >6.4         Glycemic control for adults with diabetes: <7.0     CBG:  Recent Labs Lab 09/12/16 1520 09/12/16 1948 09/12/16 2302 09/13/16 0351 09/13/16 0759  GLUCAP 259* 219* 244* 135* 70    Recent Results (from the past 240 hour(s))  Blood Culture (routine x 2)     Status: None (Preliminary result)   Collection Time: 09/11/16  7:16 PM  Result Value Ref Range Status   Specimen Description BLOOD RIGHT ANTECUBITAL  Final   Special Requests   Final    BOTTLES DRAWN AEROBIC AND ANAEROBIC Blood Culture adequate volume   Culture NO GROWTH < 24 HOURS  Final   Report Status PENDING  Incomplete  Blood Culture (routine x 2)     Status: None (Preliminary result)   Collection Time: 09/11/16  7:20 PM  Result Value Ref Range Status   Specimen Description BLOOD RIGHT HAND  Final   Special Requests IN PEDIATRIC BOTTLE Blood Culture adequate volume  Final   Culture NO GROWTH < 24 HOURS  Final   Report Status PENDING  Incomplete  Urine culture     Status: Abnormal   Collection Time: 09/11/16  7:36 PM  Result Value Ref Range Status   Specimen Description URINE, CATHETERIZED  Final   Special Requests NONE  Final   Culture 50,000 COLONIES/mL YEAST (A)  Final   Report Status 09/13/2016 FINAL  Final  MRSA PCR Screening     Status: None   Collection Time:  09/12/16  6:22 AM  Result Value Ref Range Status   MRSA by PCR NEGATIVE NEGATIVE Final    Comment:        The GeneXpert MRSA Assay (FDA approved  for NASAL specimens only), is one component of a comprehensive MRSA colonization surveillance program. It is not intended to diagnose MRSA infection nor to guide or monitor treatment for MRSA infections.      Scheduled Meds: . apixaban  2.5 mg Oral BID  . guaiFENesin  600 mg Oral BID  . hydrocortisone sodium succinate  100 mg Intravenous Q6H  . insulin aspart  0-15 Units Subcutaneous Q4H  . ipratropium  0.5 mg Nebulization Q6H  . lactose free nutrition  237 mL Oral TID WC  . latanoprost  1 drop Both Eyes QHS  . magic mouthwash  5 mL Oral TID  . miconazole nitrate   Topical BID  . simvastatin  40 mg Oral QHS  . sodium chloride flush  3 mL Intravenous Q12H     LOS: 2 days   Cherene Altes, MD [facility] Office  [phone] Pager - Text Page per Amion as per below:  On-Call/Text Page:      [email or web address]      password [email or web address]  If 7PM-7AM, please contact night-coverage [email or web address] Password [email or web address] 09/13/2016, 11:56 AM

## 2016-09-13 NOTE — Progress Notes (Signed)
Pharmacy Antibiotic Note  Randall Odom is a 83 y.o. male admitted on 09/11/2016 with sepsis. Pt has improved renal function since admission.    Plan: Adjust Vancomycin 1000 mg IV every 24 hours.   Adjust Zosyn to 3.375g IV q8h F/u cx, renal fx, vt prn   Temp (24hrs), Avg:97.9 F (36.6 C), Min:97.5 F (36.4 C), Max:98.2 F (36.8 C)   Recent Labs Lab 09/10/16 0919 09/11/16 1754  09/11/16 2227 09/12/16 0406 09/12/16 1415 09/12/16 1832 09/12/16 2356 09/12/16 2359 09/13/16 0720  WBC 31.8* 29.7*  --   --  21.8*  --   --   --   --   --   CREATININE 3.31* 3.63*  --   --  2.88*  --   --   --  2.21*  --   LATICACIDVEN  --   --   < > 3.50*  --  3.3* 4.3* 2.8*  --  1.8  < > = values in this interval not displayed.  Estimated Creatinine Clearance: 26.4 mL/min (A) (by C-G formula based on SCr of 2.21 mg/dL (H)).    Allergies  Allergen Reactions  . Spironolactone Diarrhea and Nausea And Vomiting    Antimicrobials this admission: Vancomycin 7/4 >>  Zosyn 7/4 >>   Dose adjustments this admission:  Microbiology results: 7/4 BCx: ngtd 7/4 UCx: sent  Thank you for allowing pharmacy to be a part of this patient's care.  Bertis Ruddy, PharmD Pharmacy Resident Pager #: [phone] 09/13/2016 9:31 AM

## 2016-09-13 NOTE — Progress Notes (Signed)
CRITICAL VALUE ALERT  Critical Value:  LA=2.8  Date & Time Notied:  09/12/16 0101  Provider Notified: Dr Hal Hope  Orders Received/Actions taken: repeat 500cc bolus, recheck LA in AM

## 2016-09-14 LAB — GLUCOSE, CAPILLARY
GLUCOSE-CAPILLARY: 222 mg/dL — AB (ref 65–99)
GLUCOSE-CAPILLARY: 325 mg/dL — AB (ref 65–99)
GLUCOSE-CAPILLARY: 235 mg/dL — AB (ref 65–99)
Glucose-Capillary: 308 mg/dL — ABNORMAL HIGH (ref 65–99)

## 2016-09-14 LAB — BASIC METABOLIC PANEL
Anion gap: 8 (ref 5–15)
BUN: 54 mg/dL — ABNORMAL HIGH (ref 6–20)
CALCIUM: 8.3 mg/dL — AB (ref 8.9–10.3)
CO2: 20 mmol/L — ABNORMAL LOW (ref 22–32)
CREATININE: 1.84 mg/dL — AB (ref 0.61–1.24)
Chloride: 109 mmol/L (ref 101–111)
GFR calc non Af Amer: 31 mL/min — ABNORMAL LOW (ref >60)
GFR, EST AFRICAN AMERICAN: 36 mL/min — AB (ref >60)
Glucose, Bld: 271 mg/dL — ABNORMAL HIGH (ref 65–99)
Potassium: 4.4 mmol/L (ref 3.5–5.1)
SODIUM: 137 mmol/L (ref 135–145)

## 2016-09-14 LAB — CBC
HCT: 39.1 % (ref 39.0–52.0)
Hemoglobin: 12.8 g/dL — ABNORMAL LOW (ref 13.0–17.0)
MCH: 30.5 pg (ref 26.0–34.0)
MCHC: 32.7 g/dL (ref 30.0–36.0)
MCV: 93.1 fL (ref 78.0–100.0)
PLATELETS: 134 10*3/uL — AB (ref 150–400)
RBC: 4.2 MIL/uL — AB (ref 4.22–5.81)
RDW: 13.4 % (ref 11.5–15.5)
WBC: 25.2 10*3/uL — AB (ref 4.0–10.5)

## 2016-09-14 MED ORDER — GUAIFENESIN ER 600 MG PO TB12: 600.0000 mg | ORAL_TABLET | Freq: Two times a day (BID) | ORAL | Status: DC | PRN

## 2016-09-14 MED ORDER — INSULIN ASPART 100 UNIT/ML ~~LOC~~ SOLN
0.0000 [IU] | Freq: Three times a day (TID) | SUBCUTANEOUS | Status: DC
  Administered 2016-09-14: 11 [IU] via SUBCUTANEOUS
  Administered 2016-09-15: 8 [IU] via SUBCUTANEOUS
  Administered 2016-09-15: 3 [IU] via SUBCUTANEOUS
  Administered 2016-09-15: 15 [IU] via SUBCUTANEOUS
  Administered 2016-09-16: 3 [IU] via SUBCUTANEOUS
  Administered 2016-09-16: 5 [IU] via SUBCUTANEOUS

## 2016-09-14 MED ORDER — PREDNISONE 20 MG PO TABS
20.0000 mg | ORAL_TABLET | Freq: Every day | ORAL | Status: DC
  Administered 2016-09-15: 20 mg via ORAL
  Filled 2016-09-14: qty 1

## 2016-09-14 MED ORDER — INSULIN GLARGINE 100 UNIT/ML ~~LOC~~ SOLN
10.0000 [IU] | Freq: Every day | SUBCUTANEOUS | Status: DC
  Administered 2016-09-15 (×2): 10 [IU] via SUBCUTANEOUS
  Filled 2016-09-14 (×4): qty 0.1

## 2016-09-14 MED ORDER — INSULIN ASPART 100 UNIT/ML ~~LOC~~ SOLN
0.0000 [IU] | Freq: Every day | SUBCUTANEOUS | Status: DC
  Administered 2016-09-14: 2 [IU] via SUBCUTANEOUS
  Administered 2016-09-15: 4 [IU] via SUBCUTANEOUS

## 2016-09-14 NOTE — Progress Notes (Signed)
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  He was found to be in acute renal failure and was hypothermic to 96.4 with evaluated WBCs.  Subjective: The patient is alert and conversant today though mildly confused, which I suspect is his baseline.  He reports some intermittent mild shortness of breath.  He denies chest pain nausea vomiting or abdominal pain.  He admits to having a poor appetite.  His wife is at the bedside and reports that she sees significant improvement in his overall status.  Assessment & Plan:  Sepsis - hypothermia - suspected UTI as source  CXR w/o evidence of acute infiltrate - UA suggestive of potential UTI as well as nephrolithiasis - urine culture not helpful - narrowed abx to cover UTI pathogens - cont to follow clinically   Chronic hypoxic respiratory failure 2 L nasal cannula O2 at home baseline - oxygen has been weaned to usual home dose - with sats in the upper 90s we'll attempt to wean even further  Thrush  Cont diflucan to complete a 7 day course   Acute kidney failure Likely due to prerenal azotemia - slow hydration in setting of CHF - follow trend - improving   Recent Labs Lab 09/10/16 0919 09/11/16 1754 09/12/16 0406 09/12/16 2359 09/14/16 0310  CREATININE 3.31* 3.63* 2.88* 2.21* 1.84*    Chronic combined systolic and diastolic CHF EF 13-08 % via TTE June 2018 - Beginning to show evidence of mild volume overload - follow Is/Os and daily weights - recent baseline weight appears to be 94-95 kilograms - slow IVF and consider resuming diuretic  7/8 Filed Weights   09/12/16 1642 09/13/16 0600 09/14/16 0357  Weight: 93.7 kg (206 lb 9.6 oz) 95.6 kg (210 lb 12.2 oz) 98 kg (216 lb 0.8 oz)    Hematuria - ?  UTI Has signif B kidney stones/staghorn - cont empiric abx   Chronic Afib on eliquis CHA2DS2-VASc is 6 - presently rate controlled - continue anticoagulation  Dementia No agitation at present, though mild confusion noted   CHB s/p pacer  HTN Blood pressure currently controlled  DM2 A1c 8.0 - CBG trending upward with patient now more alert/eating - adjust treatment and follow  DVT prophylaxis: eliquis Code Status: FULL CODE Family Communication: Spoke with wife at bedside Disposition Plan: PT/OT - ambulate - transfer to telemetry bed  Consultants:  none  Procedures: none  Antimicrobials:  Zosyn 7/4 > 7/6 Vancomycin 7/4 > 7/6 Rocephin 7/6 >  Objective: Blood pressure (!) 139/55, pulse 78, temperature 97.7 F (36.5 C), temperature source Oral, resp. rate 19, height 5\' 9"  (1.753 m), weight 98 kg (216 lb 0.8 oz), SpO2 96 %.  Intake/Output Summary (Last 24 hours) at 09/14/16 1524 Last data filed at 09/14/16 1019  Gross per 24 hour  Intake          2033.16 ml  Output              600 ml  Net          1433.16 ml   Filed Weights   09/12/16 1642 09/13/16 0600 09/14/16  0357  Weight: 93.7 kg (206 lb 9.6 oz) 95.6 kg (210 lb 12.2 oz) 98 kg (216 lb 0.8 oz)    Examination: General: No acute respiratory distress at rest  Lungs: CTA th/o - no wheezing  Cardiovascular: Irreg irreg - no gallup or rub  Abdomen: Nontender, nondistended, soft, bowel sounds positive, no rebound Extremities: Trace bilateral lower extremity edema  CBC:  Recent Labs Lab 09/10/16 0919 09/11/16 1754 09/12/16 0406 09/14/16 0310  WBC 31.8* 29.7* 21.8* 25.2*  NEUTROABS 25.7* 27.0*  --   --   HGB 14.9 15.6 13.0 12.8*  HCT 45.0 45.6 39.6 39.1  MCV 92.6 92.1 92.1 93.1  PLT 214.0 178 137* 449*   Basic Metabolic Panel:  Recent  Labs Lab 09/10/16 0919 09/11/16 1754 09/12/16 0406 09/12/16 2359 09/14/16 0310  NA 134* 131* 136 136 137  K 4.3 4.5 4.6 4.1 4.4  CL 95* 93* 104 107 109  CO2 28 24 20* 19* 20*  GLUCOSE 218* 267* 215* 218* 271*  BUN 90* 101* 85* 66* 54*  CREATININE 3.31* 3.63* 2.88* 2.21* 1.84*  CALCIUM 10.0 10.1 8.4* 8.0* 8.3*  MG  --  2.6* 2.1 1.9  --   PHOS  --   --  5.0*  --   --    GFR: Estimated Creatinine Clearance: 32 mL/min (A) (by C-G formula based on SCr of 1.84 mg/dL (H)).  Liver Function Tests:  Recent Labs Lab 09/10/16 0919 09/11/16 1754 09/12/16 0406  AST 22 31 24   ALT 32 36 22  ALKPHOS 68 82 63  BILITOT 0.9 1.2 1.3*  PROT 6.3 6.4* 4.9*  ALBUMIN 3.2* 3.3* 2.5*    Coagulation Profile:  Recent Labs Lab 09/12/16 1415  INR 1.33    HbA1C: Hgb A1c MFr Bld  Date/Time Value Ref Range Status  09/12/2016 02:15 PM 8.0 (H) 4.8 - 5.6 % Final    Comment:    (NOTE)         Pre-diabetes: 5.7 - 6.4         Diabetes: >6.4         Glycemic control for adults with diabetes: <7.0   08/30/2016 09:36 AM 7.5 (H) 4.8 - 5.6 % Final    Comment:    (NOTE)         Pre-diabetes: 5.7 - 6.4         Diabetes: >6.4         Glycemic control for adults with diabetes: <7.0     CBG:  Recent Labs Lab 09/13/16 1232 09/13/16 1522 09/13/16 2136 09/14/16 0733 09/14/16 1147  GLUCAP 208* 304* 315* 222* 308*    Recent Results (from the past 240 hour(s))  Blood Culture (routine x 2)     Status: None (Preliminary result)   Collection Time: 09/11/16  7:16 PM  Result Value Ref Range Status   Specimen Description BLOOD RIGHT ANTECUBITAL  Final   Special Requests   Final    BOTTLES DRAWN AEROBIC AND ANAEROBIC Blood Culture adequate volume   Culture NO GROWTH 3 DAYS  Final   Report Status PENDING  Incomplete  Blood Culture (routine x 2)     Status: None (Preliminary result)   Collection Time: 09/11/16  7:20 PM  Result Value Ref Range Status   Specimen Description BLOOD RIGHT HAND  Final    Special Requests IN PEDIATRIC BOTTLE Blood Culture adequate volume  Final   Culture NO GROWTH 3 DAYS  Final   Report Status PENDING  Incomplete  Urine culture     Status: Abnormal   Collection Time: 09/11/16  7:36 PM  Result Value Ref Range Status   Specimen Description URINE, CATHETERIZED  Final   Special Requests NONE  Final   Culture 50,000 COLONIES/mL YEAST (A)  Final   Report Status 09/13/2016 FINAL  Final  MRSA PCR Screening     Status: None   Collection Time: 09/12/16  6:22 AM  Result Value Ref Range Status   MRSA by PCR NEGATIVE NEGATIVE Final    Comment:        The GeneXpert MRSA Assay (FDA approved for NASAL specimens only), is one component of a comprehensive MRSA colonization surveillance program. It is not intended to diagnose MRSA infection nor to guide or monitor treatment for MRSA infections.      Scheduled Meds: . apixaban  2.5 mg Oral BID  . fluconazole  100 mg Oral QPM  . guaiFENesin  600 mg Oral BID  . hydrocortisone sodium succinate  50 mg Intravenous Q12H  . insulin aspart  0-5 Units Subcutaneous QHS  . insulin aspart  0-9 Units Subcutaneous TID WC  . lactose free nutrition  237 mL Oral TID WC  . latanoprost  1 drop Both Eyes QHS  . magic mouthwash  5 mL Oral TID  . miconazole nitrate   Topical BID  . simvastatin  40 mg Oral QHS  . sodium chloride flush  3 mL Intravenous Q12H     LOS: 3 days   Cherene Altes, MD [facility] Office  [phone] Pager - Text Page per Amion as per below:  On-Call/Text Page:      [email or web address]      password [email or web address]  If 7PM-7AM, please contact night-coverage [email or web address] Password [email or web address] 09/14/2016, 3:24 PM

## 2016-09-14 NOTE — Progress Notes (Signed)
Patient weaned to RA sats 93-96. Transferred to 2W19 stable.

## 2016-09-15 ENCOUNTER — Inpatient Hospital Stay (HOSPITAL_COMMUNITY): Payer: Medicare Other

## 2016-09-15 DIAGNOSIS — B370 Candidal stomatitis: Secondary | ICD-10-CM

## 2016-09-15 DIAGNOSIS — N179 Acute kidney failure, unspecified: Secondary | ICD-10-CM

## 2016-09-15 DIAGNOSIS — D72829 Elevated white blood cell count, unspecified: Secondary | ICD-10-CM

## 2016-09-15 DIAGNOSIS — A419 Sepsis, unspecified organism: Principal | ICD-10-CM

## 2016-09-15 DIAGNOSIS — I482 Chronic atrial fibrillation: Secondary | ICD-10-CM

## 2016-09-15 DIAGNOSIS — J9611 Chronic respiratory failure with hypoxia: Secondary | ICD-10-CM

## 2016-09-15 DIAGNOSIS — Z950 Presence of cardiac pacemaker: Secondary | ICD-10-CM

## 2016-09-15 DIAGNOSIS — I5042 Chronic combined systolic (congestive) and diastolic (congestive) heart failure: Secondary | ICD-10-CM

## 2016-09-15 LAB — CBC
HEMATOCRIT: 38.3 % — AB (ref 39.0–52.0)
HEMOGLOBIN: 12.4 g/dL — AB (ref 13.0–17.0)
MCH: 30.4 pg (ref 26.0–34.0)
MCHC: 32.4 g/dL (ref 30.0–36.0)
MCV: 93.9 fL (ref 78.0–100.0)
Platelets: 137 10*3/uL — ABNORMAL LOW (ref 150–400)
RBC: 4.08 MIL/uL — AB (ref 4.22–5.81)
RDW: 13.7 % (ref 11.5–15.5)
WBC: 22.4 10*3/uL — AB (ref 4.0–10.5)

## 2016-09-15 LAB — BASIC METABOLIC PANEL
Anion gap: 6 (ref 5–15)
BUN: 45 mg/dL — ABNORMAL HIGH (ref 6–20)
CALCIUM: 8.2 mg/dL — AB (ref 8.9–10.3)
CO2: 23 mmol/L (ref 22–32)
Chloride: 110 mmol/L (ref 101–111)
Creatinine, Ser: 1.53 mg/dL — ABNORMAL HIGH (ref 0.61–1.24)
GFR, EST AFRICAN AMERICAN: 45 mL/min — AB (ref >60)
GFR, EST NON AFRICAN AMERICAN: 39 mL/min — AB (ref >60)
Glucose, Bld: 210 mg/dL — ABNORMAL HIGH (ref 65–99)
POTASSIUM: 3.8 mmol/L (ref 3.5–5.1)
SODIUM: 139 mmol/L (ref 135–145)

## 2016-09-15 LAB — GLUCOSE, CAPILLARY
GLUCOSE-CAPILLARY: 146 mg/dL — AB (ref 65–99)
GLUCOSE-CAPILLARY: 152 mg/dL — AB (ref 65–99)
GLUCOSE-CAPILLARY: 273 mg/dL — AB (ref 65–99)
GLUCOSE-CAPILLARY: 369 mg/dL — AB (ref 65–99)
Glucose-Capillary: 308 mg/dL — ABNORMAL HIGH (ref 65–99)

## 2016-09-15 MED ORDER — PREDNISONE 20 MG PO TABS
40.0000 mg | ORAL_TABLET | Freq: Every day | ORAL | Status: DC
  Administered 2016-09-16: 40 mg via ORAL
  Filled 2016-09-15: qty 2

## 2016-09-15 MED ORDER — FUROSEMIDE 40 MG PO TABS
40.0000 mg | ORAL_TABLET | Freq: Every day | ORAL | Status: DC
  Administered 2016-09-15 – 2016-09-16 (×2): 40 mg via ORAL
  Filled 2016-09-15 (×2): qty 1

## 2016-09-15 NOTE — Clinical Social Work Note (Signed)
Clinical Social Work Assessment  Patient Details  Name: Randall Odom MRN: [id number] Date of Birth: June 24, 1926  Date of referral:  09/15/16               Reason for consult:  Facility Placement                Permission sought to share information with:  Family Supports Permission granted to share information::  Yes, Release of Information Signed  Name::     Wife Therapist, music::     Relationship::     Contact Information:     Housing/Transportation Living arrangements for the past 2 months:  Single Family Home Source of Information:  Spouse Patient Interpreter Needed:  None Criminal Activity/Legal Involvement Pertinent to Current Situation/Hospitalization:  No - Comment as needed Significant Relationships:  Adult Children, Spouse, Community Support Lives with:  Spouse, Facility Resident Do you feel safe going back to the place where you live?  Yes Need for family participation in patient care:  Yes (Comment)  Care giving concerns: Wife reports she would like for the patient to return back to [facility] when medically stable and ready for discharge. No concerns were reported at this time.    Social Worker assessment / plan: CSW went to speak with patient regarding discharge planning. CSW introduced self and acknowledged the patient. Patient is alert and oriented x3, however patient presented very confused. Patient was calm and cooperative with CSW assessment. Patient's niece and nephew were at bedside. Patient provided CSW with permission to speak with his wife regarding disposition. Patient reports that he does not want to go back to [facility]. CSW attempted to explore reasons why however client switched to another topic. Wife has requested for patient to return back to [facility] once medically stable and ready for discharge. CSW informed wife that CSW will contact facility to explore any concerns regarding the patient returning. CSW will then fax over requested patient  information. CSW to update FL2 for MD signature.    Employment status:  Retired Advertising copywriter PT Recommendations:  [place] / Referral to community resources:  [place]  Patient/Family's Response to care:  Wife agreeable to plan.   Patient/Family's Understanding of and Emotional Response to Diagnosis, Current Treatment, and Prognosis: Patient and family is aware of current treatment, diagnosis, and prognosis.   Emotional Assessment Appearance:  Appears older than stated age Attitude/Demeanor/Rapport:   (Unable to Assess; Patient presented very confused. CSW was advised by family members at bed side to follow up with wife. ) Affect (typically observed):  Accepting, Appropriate, Adaptable Orientation:  Oriented to Self, Oriented to Place, Oriented to Situation (Patient was confused during assessment. ) Alcohol / Substance use:  Not Applicable Psych involvement (Current and /or in the community):  No (Comment)  Discharge Needs  Concerns to be addressed:  Discharge Planning Concerns Readmission within the last 30 days:  Yes Current discharge risk:  None Barriers to Discharge:  Continued Medical Work up   [employer], [place] 09/15/2016, 1:46 PM

## 2016-09-15 NOTE — NC FL2 (Signed)
[place] MEDICAID FL2 LEVEL OF CARE SCREENING TOOL     IDENTIFICATION  Patient Name: Randall Odom Birthdate: 1926/03/12 Sex: male Admission Date (Current Location): 09/11/2016  [facility] and [place] Number:  Herbalist and Address:  The [place]. [facility], [place] [address] [place], [place] [address]      Provider Number: [id number]  Attending Physician Name and Address:  Aline August, MD  Relative Name and Phone Number:       Current Level of Care: Hospital Recommended Level of Care: [place] Prior Approval Number:    Date Approved/Denied:   PASRR Number: [id number]  Discharge Plan: SNF    Current Diagnoses: Patient Active Problem List   Diagnosis Date Noted  . Chronic respiratory failure with hypoxia ([place]) 09/11/2016  . Dementia 09/11/2016  . Acute renal failure (ARF) ([place]) 09/11/2016  . Sepsis ([place]) 09/11/2016  . Leukocytosis 09/11/2016  . Hematuria 09/11/2016  . Hyponatremia 09/11/2016  . Thrush 09/11/2016  . Community acquired pneumonia   . Pulmonary infiltrate 08/30/2016  . Acute respiratory failure with hypoxemia ([place]) 08/29/2016  . Abnormal CT of the chest 08/29/2016  . Acute and chronic respiratory failure with hypoxia ([place]) 08/29/2016  . Chronic anticoagulation 07/10/2016  . Abnormal CT scan, chest 08/13/2013  . Hyperlipidemia 08/09/2013  . Essential hypertension 08/09/2013  . Dyspnea on exertion 08/09/2013  . Chronic combined systolic and diastolic heart failure ([place]) 08/09/2013  . Permanent atrial fibrillation ([place]) 02/22/2013  . Complete heart block ([place]) 02/22/2013  . Occlusion and stenosis of carotid artery without mention of cerebral infarction 06/02/2012  . Physical deconditioning 05/18/2012  . C. difficile colitis 05/13/2012  . Dehydration 05/13/2012  . Diarrhea 05/12/2012  . Acute renal failure ([place]) 05/12/2012  . Hypokalemia 05/12/2012  . Syncope and collapse 05/06/2011  . Diabetes mellitus ([place])  05/06/2011  . Pacemaker 05/06/2011  . CAD (coronary artery disease) 05/06/2011    Orientation RESPIRATION BLADDER Height & Weight     Self, Situation, Place  O2 Continent Weight: 212 lb 4.8 oz (96.3 kg) Height:  5\' 9"  (175.3 cm)  BEHAVIORAL SYMPTOMS/MOOD NEUROLOGICAL BOWEL NUTRITION STATUS      Continent Diet  AMBULATORY STATUS COMMUNICATION OF NEEDS Skin   Limited Assist Verbally Normal                       Personal Care Assistance Level of Assistance  Bathing, Feeding, Dressing Bathing Assistance: Limited assistance Feeding assistance: Independent Dressing Assistance: Limited assistance     Functional Limitations Info  Sight, Hearing, Speech Sight Info: Adequate Hearing Info: Adequate Speech Info: Adequate    SPECIAL CARE FACTORS FREQUENCY  PT (By licensed PT), OT (By licensed OT)     PT Frequency: 5x OT Frequency: 5x            Contractures Contractures Info: Not present    Additional Factors Info  Code Status, Allergies Code Status Info: Full Allergies Info: Spironolactone           Current Medications (09/15/2016):  This is the current hospital active medication list Current Facility-Administered Medications  Medication Dose Route Frequency Provider Last Rate Last Dose  . acetaminophen (TYLENOL) tablet 650 mg  650 mg Oral Q6H PRN Toy Baker, MD       Or  . acetaminophen (TYLENOL) suppository 650 mg  650 mg Rectal Q6H PRN Doutova, Anastassia, MD      . albuterol (PROVENTIL) (2.5 MG/3ML) 0.083% nebulizer solution 2.5 mg  2.5 mg Nebulization Q2H PRN Toy Baker, MD   2.5 mg at 09/13/16 2015  . apixaban (ELIQUIS) tablet 2.5 mg  2.5 mg Oral BID Rise Patience, MD   2.5 mg at 09/15/16 0853  . cefTRIAXone (ROCEPHIN) 1 g in dextrose 5 % 50 mL IVPB  1 g Intravenous Q24H Cherene Altes, MD   Stopped at 09/14/16 1445  . fluconazole (DIFLUCAN) tablet 100 mg  100 mg Oral QPM Cherene Altes, MD   100 mg at 09/14/16 1702  .  furosemide (LASIX) tablet 40 mg  40 mg Oral Daily Alekh, Kshitiz, MD      . guaiFENesin (MUCINEX) 12 hr tablet 600 mg  600 mg Oral BID PRN Cherene Altes, MD      . HYDROcodone-acetaminophen (NORCO/VICODIN) 5-325 MG per tablet 1-2 tablet  1-2 tablet Oral Q4H PRN Toy Baker, MD   1 tablet at 09/14/16 1005  . insulin aspart (novoLOG) injection 0-15 Units  0-15 Units Subcutaneous TID WC Cherene Altes, MD   3 Units at 09/15/16 [phone]  . insulin aspart (novoLOG) injection 0-5 Units  0-5 Units Subcutaneous QHS Cherene Altes, MD   2 Units at 09/14/16 2229  . insulin glargine (LANTUS) injection 10 Units  10 Units Subcutaneous QHS Cherene Altes, MD   10 Units at 09/15/16 0031  . lactose free nutrition (BOOST PLUS) liquid 237 mL  237 mL Oral TID WC Doutova, Anastassia, MD   237 mL at 09/15/16 0800  . latanoprost (XALATAN) 0.005 % ophthalmic solution 1 drop  1 drop Both Eyes QHS Doutova, Anastassia, MD   1 drop at 09/14/16 2240  . magic mouthwash  5 mL Oral TID Toy Baker, MD   5 mL at 09/15/16 0030  . miconazole nitrate (MICATIN) topical powder   Topical BID Allie Bossier, MD      . ondansetron [facility]) tablet 4 mg  4 mg Oral Q6H PRN Toy Baker, MD       Or  . ondansetron (ZOFRAN) injection 4 mg  4 mg Intravenous Q6H PRN Toy Baker, MD      . Derrill Memo ON 09/16/2016] predniSONE (DELTASONE) tablet 40 mg  40 mg Oral Q breakfast Alekh, Kshitiz, MD      . simvastatin (ZOCOR) tablet 40 mg  40 mg Oral QHS Toy Baker, MD   40 mg at 09/14/16 2227  . sodium chloride flush (NS) 0.9 % injection 3 mL  3 mL Intravenous Q12H Toy Baker, MD   3 mL at 09/14/16 2227     Discharge Medications: Please see discharge summary for a list of discharge medications.  Relevant Imaging Results:  Relevant Lab Results:   Additional Information SS# [id number]  Raymondo Band, LCSWA

## 2016-09-15 NOTE — Progress Notes (Signed)
Patient ID: Randall Odom, male   DOB: 1926-05-21, 83 y.o.   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  He was found to be in acute renal failure and was hypothermic to 96.4 with elevated WBCs. He was started on IV antibiotics and IV fluids and Diflucan was added after urine culture grew Yeast. His condition is improving and he has been transferred out of the stepdown unit.  Assessment & Plan:   Active Problems:   Diabetes mellitus (HCC)   Pacemaker   CAD (coronary artery disease)   Acute renal failure (HCC)   Dehydration   Permanent atrial fibrillation (HCC)   Essential hypertension   Chronic combined systolic and diastolic heart failure (HCC)   Chronic anticoagulation   Chronic respiratory failure with hypoxia (HCC)   Dementia   Acute renal failure (ARF) (HCC)   Sepsis (HCC)   Leukocytosis   Hematuria   Hyponatremia   Thrush  Sepsis with hypothermia: improved - Probably due to UTI. Urine culture only grew yeast. Continue Rocephin and Diflucan for now. CXR w/o evidence of acute infiltrate  - cont to follow clinically  - Blood pressure is improved. Transition hydrocortisone to oral prednisone  Urinary tract infection - Continue Rocephin and Diflucan for now - Patient might need outpatient urology evaluation for management of nephrolithiasis  Chronic hypoxic respiratory failure 2 L nasal cannula O2 at home baseline  - Continue oxygen supplementation and wean as able. - Repeat chest x-ray.  Thrush  Cont diflucan to complete a 7 day course   Acute kidney failure Likely  due to prerenal azotemia  - improving. Stop IV fluids. Restart Lasix 40 mg daily, which is half the dose patient takes at home.  Chronic combined systolic and diastolic heart failure: EF 40-45% via TTE in June 2018. DC IV fluids. Resume Lasix 40 mg daily. Strict input and output and daily weights.  Hematuria -Probably due to UTI  Bilateral renal calculi with staghorn calculus - Outpatient urology evaluation  Leukocytosis - Probably secondary to UTI and steroid use. Repeat a.m. Labs. discontinue IV hydrocortisone  Chronic A. fib on requests -CHA2DS2-VASc is 6 - presently rate controlled - continue anticoagulation  Dementia No agitation at present, though mild confusion noted   CHB s/p pacer  HTN Blood pressure currently controlled  DM2 A1c 8.0  - Continue Lantus and sliding scale insulin    DVT prophylaxis: Eliquis Code Status:  Full Family Communication: None at bedside Disposition Plan: Probable nursing home in 1-2 days  Consultants: None  Procedures: None  Antimicrobials:   Zosyn 7/4 > 7/6 Vancomycin 7/4 > 7/6 Rocephin 7/6 > Diflucan   Subjective: Patient seen and examined at bedside. He is awake but slightly confused. He denies any current shortness of breath, chest pain, nausea.  Objective: Vitals:   09/14/16 1555 09/14/16 1706 09/14/16 1944 09/15/16 0501  BP: 140/68 (!) 143/62 136/71 (!) 118/94  Pulse:  90 77 70  Resp:  19 20 18   Temp: (!) 97.4 F (36.3 C) 97.7 F (36.5 C) 97.7 F (36.5 C) 97.7 F (36.5 C)  TempSrc: Oral Oral Oral Oral  SpO2:  93% 95% 95%  Weight:    96.3 kg (212 lb 4.8 oz)  Height:        Intake/Output Summary (Last 24 hours) at 09/15/16 1023 Last data filed at 09/15/16 0900  Gross per 24 hour  Intake              600 ml  Output                0 ml  Net              600 ml   Filed Weights   09/13/16 0600 09/14/16 0357 09/15/16 0501  Weight: 95.6 kg (210 lb 12.2 oz) 98 kg (216 lb 0.8 oz) 96.3 kg (212 lb 4.8 oz)     Examination:  General exam: Appears calm and comfortable  Respiratory system: Bilateral decreased breath sound at bases with scattered crackles Cardiovascular system: S1 & S2 heard, rate controlled  Gastrointestinal system: Abdomen is nondistended, soft and nontender. Normal bowel sounds heard. Extremities: No cyanosis, clubbing; trace bilateral lower extremity edema  Data Reviewed: I have personally reviewed following labs and imaging studies  CBC:  Recent Labs Lab 09/10/16 0919 09/11/16 1754 09/12/16 0406 09/14/16 0310 09/15/16 0216  WBC 31.8* 29.7* 21.8* 25.2* 22.4*  NEUTROABS 25.7* 27.0*  --   --   --   HGB 14.9 15.6 13.0 12.8* 12.4*  HCT 45.0 45.6 39.6 39.1 38.3*  MCV 92.6 92.1 92.1 93.1 93.9  PLT 214.0 178 137* 134* 376*   Basic Metabolic Panel:  Recent Labs Lab 09/11/16 1754 09/12/16 0406 09/12/16 2359 09/14/16 0310 09/15/16 0216  NA 131* 136 136 137 139  K 4.5 4.6 4.1 4.4 3.8  CL 93* 104 107 109 110  CO2 24 20* 19* 20* 23  GLUCOSE 267* 215* 218* 271* 210*  BUN 101* 85* 66* 54* 45*  CREATININE 3.63* 2.88* 2.21* 1.84* 1.53*  CALCIUM 10.1 8.4* 8.0* 8.3* 8.2*  MG 2.6* 2.1 1.9  --   --   PHOS  --  5.0*  --   --   --    GFR: Estimated Creatinine Clearance: 38.2 mL/min (A) (by C-G formula based on SCr of 1.53 mg/dL (H)). Liver Function Tests:  Recent Labs Lab 09/10/16 0919 09/11/16 1754 09/12/16 0406  AST 22 31 24   ALT 32 36 22  ALKPHOS 68 82 63  BILITOT 0.9 1.2 1.3*  PROT 6.3 6.4* 4.9*  ALBUMIN 3.2* 3.3* 2.5*   No results for input(s): LIPASE, AMYLASE in the last 168 hours. No results for input(s): AMMONIA in the last 168 hours. Coagulation Profile:  Recent Labs Lab 09/12/16 1415  INR 1.33   Cardiac Enzymes: No results for input(s): CKTOTAL, CKMB, CKMBINDEX, TROPONINI in the last 168 hours. BNP (last 3 results)  Recent Labs  09/10/16 0919  PROBNP 30.0   HbA1C:  Recent Labs  09/12/16 1415  HGBA1C 8.0*   CBG:  Recent  Labs Lab 09/14/16 1147 09/14/16 1554 09/14/16 2113 09/15/16 0641 09/15/16 0700  GLUCAP 308* 325* 235* 146* 152*   Lipid Profile: No results for input(s): CHOL, HDL, LDLCALC, TRIG, CHOLHDL, LDLDIRECT in the last 72 hours. Thyroid Function Tests: No results for input(s): TSH, T4TOTAL, FREET4, T3FREE, THYROIDAB in the last 72 hours. Anemia Panel: No results for input(s): VITAMINB12, FOLATE, FERRITIN, TIBC, IRON, RETICCTPCT in the last 72 hours. Sepsis Labs:  Recent Labs Lab 09/12/16 1415 09/12/16 1832 09/12/16 2356 09/13/16 0720  PROCALCITON <0.10  --   --   --  LATICACIDVEN 3.3* 4.3* 2.8* 1.8    Recent Results (from the past 240 hour(s))  Blood Culture (routine x 2)     Status: None (Preliminary result)   Collection Time: 09/11/16  7:16 PM  Result Value Ref Range Status   Specimen Description BLOOD RIGHT ANTECUBITAL  Final   Special Requests   Final    BOTTLES DRAWN AEROBIC AND ANAEROBIC Blood Culture adequate volume   Culture NO GROWTH 3 DAYS  Final   Report Status PENDING  Incomplete  Blood Culture (routine x 2)     Status: None (Preliminary result)   Collection Time: 09/11/16  7:20 PM  Result Value Ref Range Status   Specimen Description BLOOD RIGHT HAND  Final   Special Requests IN PEDIATRIC BOTTLE Blood Culture adequate volume  Final   Culture NO GROWTH 3 DAYS  Final   Report Status PENDING  Incomplete  Urine culture     Status: Abnormal   Collection Time: 09/11/16  7:36 PM  Result Value Ref Range Status   Specimen Description URINE, CATHETERIZED  Final   Special Requests NONE  Final   Culture 50,000 COLONIES/mL YEAST (A)  Final   Report Status 09/13/2016 FINAL  Final  MRSA PCR Screening     Status: None   Collection Time: 09/12/16  6:22 AM  Result Value Ref Range Status   MRSA by PCR NEGATIVE NEGATIVE Final    Comment:        The GeneXpert MRSA Assay (FDA approved for NASAL specimens only), is one component of a comprehensive MRSA  colonization surveillance program. It is not intended to diagnose MRSA infection nor to guide or monitor treatment for MRSA infections.          Radiology Studies: No results found.      Scheduled Meds: . apixaban  2.5 mg Oral BID  . fluconazole  100 mg Oral QPM  . furosemide  40 mg Oral Daily  . insulin aspart  0-15 Units Subcutaneous TID WC  . insulin aspart  0-5 Units Subcutaneous QHS  . insulin glargine  10 Units Subcutaneous QHS  . lactose free nutrition  237 mL Oral TID WC  . latanoprost  1 drop Both Eyes QHS  . magic mouthwash  5 mL Oral TID  . miconazole nitrate   Topical BID  . [START ON 09/16/2016] predniSONE  40 mg Oral Q breakfast  . simvastatin  40 mg Oral QHS  . sodium chloride flush  3 mL Intravenous Q12H   Continuous Infusions: . cefTRIAXone (ROCEPHIN)  IV Stopped (09/14/16 1445)     LOS: 4 days        Aline August, MD [facility] Pager [phone]  If 7PM-7AM, please contact night-coverage [email or web address] Password [email or web address] 09/15/2016, 10:23 AM

## 2016-09-15 NOTE — Progress Notes (Signed)
CSW spoke with facility representative Abigail Butts at [facility]. Per Abigail Butts, patient can return to facility once medically stable and ready for discharge. Abigail Butts was informed that weekday CSW will be in contact regarding patient's status.   No other concerns reported at this time. CSW will continue to follow and provide support to patient and family while in [place], [place] Worker [facility] [phone]

## 2016-09-16 LAB — COMPREHENSIVE METABOLIC PANEL
ALT: 39 U/L (ref 17–63)
AST: 32 U/L (ref 15–41)
Albumin: 2.4 g/dL — ABNORMAL LOW (ref 3.5–5.0)
Alkaline Phosphatase: 81 U/L (ref 38–126)
Anion gap: 7 (ref 5–15)
BUN: 34 mg/dL — ABNORMAL HIGH (ref 6–20)
CHLORIDE: 107 mmol/L (ref 101–111)
CO2: 26 mmol/L (ref 22–32)
CREATININE: 1.47 mg/dL — AB (ref 0.61–1.24)
Calcium: 8.2 mg/dL — ABNORMAL LOW (ref 8.9–10.3)
GFR, EST AFRICAN AMERICAN: 47 mL/min — AB (ref >60)
GFR, EST NON AFRICAN AMERICAN: 41 mL/min — AB (ref >60)
Glucose, Bld: 178 mg/dL — ABNORMAL HIGH (ref 65–99)
POTASSIUM: 3.9 mmol/L (ref 3.5–5.1)
SODIUM: 140 mmol/L (ref 135–145)
Total Bilirubin: 0.5 mg/dL (ref 0.3–1.2)
Total Protein: 4.8 g/dL — ABNORMAL LOW (ref 6.5–8.1)

## 2016-09-16 LAB — CULTURE, BLOOD (ROUTINE X 2)
CULTURE: NO GROWTH
CULTURE: NO GROWTH
SPECIAL REQUESTS: ADEQUATE
Special Requests: ADEQUATE

## 2016-09-16 LAB — CBC WITH DIFFERENTIAL/PLATELET
BASOS PCT: 0 %
Basophils Absolute: 0 10*3/uL (ref 0.0–0.1)
EOS ABS: 0 10*3/uL (ref 0.0–0.7)
Eosinophils Relative: 0 %
HCT: 38.7 % — ABNORMAL LOW (ref 39.0–52.0)
Hemoglobin: 12.6 g/dL — ABNORMAL LOW (ref 13.0–17.0)
LYMPHS ABS: 2.2 10*3/uL (ref 0.7–4.0)
Lymphocytes Relative: 11 %
MCH: 30.3 pg (ref 26.0–34.0)
MCHC: 32.6 g/dL (ref 30.0–36.0)
MCV: 93 fL (ref 78.0–100.0)
MONO ABS: 2.6 10*3/uL — AB (ref 0.1–1.0)
Monocytes Relative: 13 %
NEUTROS PCT: 76 %
Neutro Abs: 15.3 10*3/uL — ABNORMAL HIGH (ref 1.7–7.7)
PLATELETS: 126 10*3/uL — AB (ref 150–400)
RBC: 4.16 MIL/uL — AB (ref 4.22–5.81)
RDW: 13.6 % (ref 11.5–15.5)
WBC: 20.1 10*3/uL — AB (ref 4.0–10.5)

## 2016-09-16 LAB — MAGNESIUM: MAGNESIUM: 1.7 mg/dL (ref 1.7–2.4)

## 2016-09-16 LAB — GLUCOSE, CAPILLARY
GLUCOSE-CAPILLARY: 154 mg/dL — AB (ref 65–99)
Glucose-Capillary: 230 mg/dL — ABNORMAL HIGH (ref 65–99)

## 2016-09-16 MED ORDER — FLUCONAZOLE 100 MG PO TABS: 100.0000 mg | ORAL_TABLET | Freq: Every evening | ORAL | 0 refills | Status: DC

## 2016-09-16 MED ORDER — FUROSEMIDE 40 MG PO TABS: 40.0000 mg | ORAL_TABLET | Freq: Every day | ORAL | 0 refills | Status: DC

## 2016-09-16 MED ORDER — GUAIFENESIN ER 600 MG PO TB12: 600.0000 mg | ORAL_TABLET | Freq: Two times a day (BID) | ORAL | 0 refills | Status: DC | PRN

## 2016-09-16 MED ORDER — APIXABAN 5 MG PO TABS: 5.0000 mg | ORAL_TABLET | Freq: Two times a day (BID) | ORAL | Status: DC

## 2016-09-16 MED ORDER — APIXABAN 2.5 MG PO TABS: 2.5000 mg | ORAL_TABLET | Freq: Two times a day (BID) | ORAL | 0 refills | Status: DC

## 2016-09-16 MED ORDER — CEPHALEXIN 500 MG PO CAPS: 500.0000 mg | ORAL_CAPSULE | Freq: Three times a day (TID) | ORAL | 0 refills | Status: AC

## 2016-09-16 MED ORDER — PREDNISONE 10 MG PO TABS: ORAL_TABLET | ORAL | 0 refills | Status: DC

## 2016-09-16 NOTE — NC FL2 (Signed)
[place] MEDICAID FL2 LEVEL OF CARE SCREENING TOOL     IDENTIFICATION  Patient Name: Randall Odom Birthdate: 04/26/1926 Sex: male Admission Date (Current Location): 09/11/2016  [facility] and [place] Number:  Herbalist and Address:  The [place]. [facility], [place] [address] [place], [place] [address]      Provider Number: [id number]  Attending Physician Name and Address:  Aline August, MD  Relative Name and Phone Number:       Current Level of Care: Hospital Recommended Level of Care: [place] Prior Approval Number:    Date Approved/Denied:   PASRR Number: [id number]  Discharge Plan: SNF    Current Diagnoses: Patient Active Problem List   Diagnosis Date Noted  . Chronic respiratory failure with hypoxia ([place]) 09/11/2016  . Dementia 09/11/2016  . Acute renal failure (ARF) ([place]) 09/11/2016  . Sepsis ([place]) 09/11/2016  . Leukocytosis 09/11/2016  . Hematuria 09/11/2016  . Hyponatremia 09/11/2016  . Thrush 09/11/2016  . Community acquired pneumonia   . Pulmonary infiltrate 08/30/2016  . Acute respiratory failure with hypoxemia ([place]) 08/29/2016  . Abnormal CT of the chest 08/29/2016  . Acute and chronic respiratory failure with hypoxia ([place]) 08/29/2016  . Chronic anticoagulation 07/10/2016  . Abnormal CT scan, chest 08/13/2013  . Hyperlipidemia 08/09/2013  . Essential hypertension 08/09/2013  . Dyspnea on exertion 08/09/2013  . Chronic combined systolic and diastolic heart failure ([place]) 08/09/2013  . Permanent atrial fibrillation ([place]) 02/22/2013  . Complete heart block ([place]) 02/22/2013  . Occlusion and stenosis of carotid artery without mention of cerebral infarction 06/02/2012  . Physical deconditioning 05/18/2012  . C. difficile colitis 05/13/2012  . Dehydration 05/13/2012  . Diarrhea 05/12/2012  . Acute renal failure ([place]) 05/12/2012  . Hypokalemia 05/12/2012  . Syncope and collapse 05/06/2011  . Diabetes mellitus ([place])  05/06/2011  . Pacemaker 05/06/2011  . CAD (coronary artery disease) 05/06/2011    Orientation RESPIRATION BLADDER Height & Weight     Self, Situation, Place  O2 Continent Weight: 205 lb 14.4 oz (93.4 kg) Height:  5\' 9"  (175.3 cm)  BEHAVIORAL SYMPTOMS/MOOD NEUROLOGICAL BOWEL NUTRITION STATUS      Continent Diet  AMBULATORY STATUS COMMUNICATION OF NEEDS Skin   Limited Assist Verbally Normal                       Personal Care Assistance Level of Assistance  Bathing, Feeding, Dressing Bathing Assistance: Limited assistance Feeding assistance: Independent Dressing Assistance: Limited assistance     Functional Limitations Info  Sight, Hearing, Speech Sight Info: Adequate Hearing Info: Adequate Speech Info: Adequate    SPECIAL CARE FACTORS FREQUENCY  PT (By licensed PT), OT (By licensed OT)     PT Frequency: 5x OT Frequency: 5x            Contractures Contractures Info: Not present    Additional Factors Info  Code Status, Allergies Code Status Info: Full Allergies Info: Spironolactone           Current Medications (09/16/2016):  This is the current hospital active medication list Current Facility-Administered Medications  Medication Dose Route Frequency Provider Last Rate Last Dose  . acetaminophen (TYLENOL) tablet 650 mg  650 mg Oral Q6H PRN Toy Baker, MD       Or  . acetaminophen (TYLENOL) suppository 650 mg  650 mg Rectal Q6H PRN Doutova, Anastassia, MD      . albuterol (PROVENTIL) (2.5 MG/3ML) 0.083% nebulizer solution 2.5 mg  2.5 mg Nebulization Q2H PRN Toy Baker, MD   2.5 mg at 09/13/16 2015  . apixaban (ELIQUIS) tablet 2.5 mg  2.5 mg Oral BID Rise Patience, MD   2.5 mg at 09/16/16 5449  . cefTRIAXone (ROCEPHIN) 1 g in dextrose 5 % 50 mL IVPB  1 g Intravenous Q24H Cherene Altes, MD   Stopped at 09/15/16 1727  . fluconazole (DIFLUCAN) tablet 100 mg  100 mg Oral QPM Cherene Altes, MD   100 mg at 09/15/16 1701  .  furosemide (LASIX) tablet 40 mg  40 mg Oral Daily Aline August, MD   40 mg at 09/16/16 0821  . guaiFENesin (MUCINEX) 12 hr tablet 600 mg  600 mg Oral BID PRN Cherene Altes, MD      . HYDROcodone-acetaminophen (NORCO/VICODIN) 5-325 MG per tablet 1-2 tablet  1-2 tablet Oral Q4H PRN Toy Baker, MD   1 tablet at 09/14/16 1005  . insulin aspart (novoLOG) injection 0-15 Units  0-15 Units Subcutaneous TID WC Cherene Altes, MD   3 Units at 09/16/16 [phone]  . insulin aspart (novoLOG) injection 0-5 Units  0-5 Units Subcutaneous QHS Cherene Altes, MD   4 Units at 09/15/16 2246  . insulin glargine (LANTUS) injection 10 Units  10 Units Subcutaneous QHS Cherene Altes, MD   10 Units at 09/15/16 2246  . lactose free nutrition (BOOST PLUS) liquid 237 mL  237 mL Oral TID WC Doutova, Anastassia, MD   237 mL at 09/16/16 0822  . latanoprost (XALATAN) 0.005 % ophthalmic solution 1 drop  1 drop Both Eyes QHS Doutova, Anastassia, MD   1 drop at 09/15/16 2248  . magic mouthwash  5 mL Oral TID Toy Baker, MD   5 mL at 09/16/16 0823  . miconazole nitrate (MICATIN) topical powder   Topical BID Allie Bossier, MD      . ondansetron [facility]) tablet 4 mg  4 mg Oral Q6H PRN Toy Baker, MD       Or  . ondansetron (ZOFRAN) injection 4 mg  4 mg Intravenous Q6H PRN Doutova, Anastassia, MD      . predniSONE (DELTASONE) tablet 40 mg  40 mg Oral Q breakfast Aline August, MD   40 mg at 09/16/16 0712  . simvastatin (ZOCOR) tablet 40 mg  40 mg Oral QHS Toy Baker, MD   40 mg at 09/15/16 2245  . sodium chloride flush (NS) 0.9 % injection 3 mL  3 mL Intravenous Q12H Toy Baker, MD   3 mL at 09/16/16 1975     Discharge Medications: Please see discharge summary for a list of discharge medications.  Relevant Imaging Results:  Relevant Lab Results:   Additional Information SS# [id number]  Wende Neighbors, LCSW

## 2016-09-16 NOTE — Care Management Important Message (Signed)
Important Message  Patient Details  Name: Randall Odom MRN: [id number] Date of Birth: 14-May-1926   Medicare Important Message Given:  Yes    [name] Marts 09/16/2016, 2:36 PM

## 2016-09-16 NOTE — Discharge Summary (Addendum)
Physician Discharge Summary  Randall Odom WIO:[id number] DOB: Aug 02, 1926 DOA: 09/11/2016  PCP: Prince Solian, MD  Admit date: 09/11/2016 Discharge date: 09/16/2016  Admitted From: Nursing home Disposition:  Nursing home   Recommendations for Outpatient Follow-up:  1. 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 He was found to be in acute renal failure and was hypothermic to 96.4 with elevated WBCs. He was started on IV antibiotics and IV fluids and Diflucan was added after urine culture grew Yeast. His condition is improving and he has been transferred out of the stepdown unit. He is stable to be transferred back to the nursing home  Discharge Diagnoses:  Active Problems:   Diabetes mellitus (HCC)   Pacemaker   CAD (coronary artery disease)   Acute renal failure (HCC)   Dehydration   Permanent atrial fibrillation (HCC)   Essential hypertension   Chronic combined systolic and diastolic heart failure (HCC)   Chronic anticoagulation   Chronic respiratory failure with hypoxia (HCC)   Dementia   Acute renal failure (ARF) (HCC)   Sepsis (HCC)   Leukocytosis   Hematuria   Hyponatremia   Thrush  Sepsis with hypothermia: improved - Probably due to UTI. Urine culture only grew yeast. Currently on Rocephin and Diflucan for now. CXR w/o evidence of acute infiltrate   Urinary tract infection - Currently on Rocephin and Diflucan for now - Patient  might need outpatient urology evaluation for management of nephrolithiasis -Discharge on Keflex and Diflucan for 4 more days  Chronic hypoxic respiratory failure - Continue oxygen by nasal cannula - Repeat chest x-ray from 09/15/2016 shows improvement.  Thrush  Cont diflucan to complete a 7 day course   Acute kidney failure Likely due to prerenal azotemia  - improved.  Off IV fluids.  -  continue  Lasix 40 mg daily, which is half the dose patient takes at home. Follow BMP in the nursing home   Chronic combined systolic and diastolic heart failure: EF 40-45% via TTE in June 2018. DC IV fluids. Resume Lasix 40 mg daily. Strict input and output and daily weights.  Hematuria -Probably due to UTI  Bilateral renal calculi with staghorn calculus - Outpatient urology evaluation  Leukocytosis - Probably secondary to UTI and steroid use.  - Wbc's improving. Outpatient follow-up   Chronic A. fib on requests -CHA2DS2-VASc is 6 - presently rate controlled  - continue Apixaban at 5 mg BID since his renal function has improved  Dementia Stable  CHB s/p pacer  HTN Blood pressure currently controlled  DM2 - Outpatient follow-up. Continue glipizide   Discharge Instructions  Discharge Instructions    Call MD for:  difficulty breathing, headache or visual disturbances    Complete by:  As directed    Call MD for:  extreme fatigue    Complete by:  As directed    Call MD for:  hives    Complete by:  As directed    Call MD for:  persistant dizziness or light-headedness  Complete by:  As directed    Call MD for:  persistant nausea and vomiting    Complete by:  As directed    Call MD for:  temperature >100.4    Complete by:  As directed    Diet - low sodium heart healthy    Complete by:  As directed    Diet Carb Modified    Complete by:  As directed    Discharge instructions    Complete by:  As directed    Fall precautions   Increase activity slowly    Complete by:   As directed       Contact information for after-discharge care    Destination    [facility] Follow up.   Specialty:  [place] information: [place] [phone]             Allergies  Allergen Reactions  . Spironolactone Diarrhea and Nausea And Vomiting   Allergies as of 09/16/2016      Reactions   Spironolactone Diarrhea, Nausea And Vomiting      Medication List    TAKE these medications   apixaban 5 MG Tabs tablet Commonly known as:  ELIQUIS TAKE 1 TABLET (5 MG TOTAL) BY MOUTH 2 (TWO) TIMES DAILY. What changed:  how much to take  how to take this  when to take this  additional instructions   cephALEXin 500 MG capsule Commonly known as:  KEFLEX Take 1 capsule (500 mg total) by mouth 3 (three) times daily.   fluconazole 100 MG tablet Commonly known as:  DIFLUCAN Take 1 tablet (100 mg total) by mouth every evening.   fluticasone 50 MCG/ACT nasal spray Commonly known as:  FLONASE Place 2 sprays into the nose daily as needed for allergies. What changed:  how to take this   furosemide 40 MG tablet Commonly known as:  LASIX Take 1 tablet (40 mg total) by mouth daily. What changed:  when to take this   glipiZIDE 5 MG tablet Commonly known as:  GLUCOTROL Take 5 mg by mouth daily before breakfast.   guaiFENesin 600 MG 12 hr tablet Commonly known as:  MUCINEX Take 1 tablet (600 mg total) by mouth 2 (two) times daily as needed for cough or to loosen phlegm.   insulin aspart 100 UNIT/ML injection Commonly known as:  NOVOLOG Before each meal 3 times a day, 140-199 - 2 units, 200-250 - 4 units, 251-299 - 6 units,  300-349 - 8 units,  350 or above 10 units. Insulin PEN if approved, provide syringes and needles if needed. What changed:  how much to take  how to take this  when to take this  additional instructions   multivitamin with minerals Tabs tablet Take  1 tablet by mouth daily.   OXYGEN Inhale 2 L into the lungs continuous.   predniSONE 10 MG tablet Commonly known as:  DELTASONE 30mg  daily for 2 days, then 20mg  daily for 2 days, then 10mg  daily for 2 days then stop What changed:  medication strength  additional instructions   simvastatin 40 MG tablet Commonly known as:  ZOCOR Take 1 tablet (40 mg total) by mouth at bedtime.   terazosin 5 MG capsule Commonly known as:  HYTRIN Take 1 capsule (5 mg total) by mouth at bedtime.   TRAVATAN Z 0.004 % Soln ophthalmic solution Generic drug:  Travoprost (BAK Free) Place 1 drop into both eyes at bedtime.   Vitamin  D 2000 units tablet Take 2,000 Units by mouth daily.       Consultations:  None   Procedures/Studies: Ct Abdomen Pelvis Wo Contrast  Result Date: 09/11/2016 CLINICAL DATA:  Abdominal pain and sepsis of unknown etiology EXAM: CT ABDOMEN AND PELVIS WITHOUT CONTRAST TECHNIQUE: Multidetector CT imaging of the abdomen and pelvis was performed following the standard protocol without IV contrast. COMPARISON:  Chest CT from 08/29/2016 and CT AP from 02/18/2010 FINDINGS: Lower chest: Partially resolves lower lobe pulmonary consolidations and effusions with some residual streaky parenchymal areas of scarring and/or atelectasis remaining. There is coronary arteriosclerosis with right atrial and right ventricular pacing leads noted. Included heart is top-normal in size. There is no pericardial effusion. Hepatobiliary: No focal liver abnormality is seen. No gallstones, gallbladder wall thickening, or biliary dilatation. Pancreas: Atrophic appearing pancreas without ductal dilatation or mass. Spleen: No splenomegaly or focal lesions. Adrenals/Urinary Tract: Normal bilateral adrenal glands. Bilateral nonobstructing renal calculi with exophytic stable cyst off the interpolar right posterior cortex measuring 2.8 cm in diameter. The renal calculi. Medially there is mild caliectasis bilaterally.  The largest renal calculus on the left is approximately 9 mm in length and on the right 14 mm. Nonspecific perinephric fat stranding is seen bilaterally which can be seen in the setting of urinary tract infections. Stomach/Bowel: The stomach is physiologically distended with normal small bowel rotation. Small epiphrenic nodular focus is stable measuring 13 mm in short axis and may represent a ephrenic diverticulum. This appears stable. Differential possibility might include an adjacent lymph node. Duodenal diverticula again noted. Small and large bowel loops demonstrate no obstruction or inflammation. The appendix is surgically absent. Minimal diverticulosis along the distal descending and sigmoid colon. Vascular/Lymphatic: Aortoiliac and branch vessel atherosclerosis without aneurysm. No pathologically enlarged appearing lymph nodes. Reproductive: Prostate and seminal vesicles are within normal limits. Other: No abdominal wall hernia or abnormality. No abdominopelvic ascites. Musculoskeletal: Degenerative disc disease L5-S1 with grade 1 bordering on grade 2 spondylolisthesis of L5 on S1 secondary to bilateral L5 pars defects. No acute or suspicious osseous abnormality. IMPRESSION: 1. Partial resolution of bilateral lower lobe pulmonary consolidations since prior CT chest. 2. Scattered colonic diverticulosis along the distal descending sigmoid colon without acute diverticulitis. 3. Bilateral renal calculi, new since 02/18/2010 potentially representing evolving staghorn calculi with mild caliectasis. Given perinephric fat stranding, correlate to exclude urinary tract infection. Stable right upper pole 2.8 cm renal simple cyst. 4. Aortoiliac and branch vessel atherosclerosis. 5. Chronic L5 pars defects with grade 1 bordering on grade 2 spondylolisthesis of L5 on S1. Electronically Signed   By: Ashley Royalty M.D.   On: 09/11/2016 20:54   Dg Chest 2 View  Result Date: 09/15/2016 CLINICAL DATA:  Dyspnea EXAM: CHEST  2  VIEW COMPARISON:  09/13/2016 FINDINGS: Chronic cardiomegaly. There is a dual-chamber pacer from the right with duplicated right ventricular leads. Chronic cardiomegaly. Stable aortic tortuosity. Chronic interstitial type opacities at the bases. No Kerley lines, air bronchograms, or pneumothorax. IMPRESSION: 1. Cardiomegaly without failure. 2. Low volume chest with scar-like opacities at the bases. No evidence of superimposed pneumonia. Electronically Signed   By: Monte Fantasia M.D.   On: 09/15/2016 12:10   Dg Chest 2 View  Result Date: 09/11/2016 CLINICAL DATA:  Pt reports to the ED for eval of abnormal lab. Per EMS his BUN is elevated at 100. Pt reports generalized weakness "for a while". Hx of DM and HTN. Former smoker EXAM: CHEST  2 VIEW COMPARISON:  09/10/2016 FINDINGS: Right-sided  transvenous pacemaker leads overlie the right atrium and right ventricle. An orphan lead overlies the right anterior chest. Heart size is normal. There is bibasilar atelectasis or scarring. There are no focal consolidations. Small left pleural effusion is noted. No pulmonary edema. IMPRESSION: 1. Scarring or atelectasis at the bases, stable in appearance. 2.  No evidence for acute  abnormality. Electronically Signed   By: Nolon Nations M.D.   On: 09/11/2016 18:31   Dg Chest 2 View  Result Date: 09/10/2016 CLINICAL DATA:  Dyspnea on exertion.  History of pneumonia. EXAM: CHEST  2 VIEW COMPARISON:  09/02/2016 FINDINGS: Hyperinflation. Accentuation of expected thoracic kyphosis with mild loss of vertebral body height at mid thoracic levels. Pacer/AICD device. Midline trachea. The Chin overlies the apices. Normal heart size. Tortuous thoracic aorta. Atherosclerosis in the transverse aorta. Probable trace left pleural fluid. No pneumothorax. Improved bibasilar aeration, with patchy left base airspace disease and minimal probable right base scarring remaining. No new pulmonary opacity. IMPRESSION: Overall improved bibasilar  aeration. Mild left base pulmonary opacity remains, favored to represent atelectasis. Residual pneumonia cannot be entirely excluded. No new superimposed process. Aortic Atherosclerosis (ICD10-I70.0). Electronically Signed   By: Abigail Miyamoto M.D.   On: 09/10/2016 09:49   Dg Chest 2 View  Result Date: 09/02/2016 CLINICAL DATA:  Cough, weakness, and shortness of breath. History of atrial fibrillation, CHF, coronary artery disease, acute respiratory failure. EXAM: CHEST  2 VIEW COMPARISON:  Chest x-ray of August 31, 2016 FINDINGS: The lungs are mildly hyperinflated. Interstitial density in the right mid and lower lung is slightly less conspicuous today. There is persistent infiltrate at the left lung base. There small bilateral pleural effusions. The heart is top-normal in size. The pulmonary vascularity is not engorged. There is mild tortuosity of the descending thoracic aorta. There is calcification in the wall of the aortic arch. The ICD is in stable position. IMPRESSION: Slight interval improvement in infiltrate in the right mid and lower lung. Persistent left basilar atelectasis or pneumonia. No overt CHF. Underlying COPD. Thoracic aortic atherosclerosis. Electronically Signed   By: David  Martinique M.D.   On: 09/02/2016 11:57   Dg Chest 2 View  Result Date: 08/31/2016 CLINICAL DATA:  Pulmonary infiltrate.  Follow-up. EXAM: CHEST  2 VIEW COMPARISON:  Chest CT August 29, 2016.  Chest x-ray August 29, 2016. FINDINGS: No pneumothorax. Stable pacemaker. Persistent infiltrate in the right mid and lower lungs as well as the left base. No other interval changes. IMPRESSION: No significant change in multifocal infiltrate. Recommend follow-up to resolution. Electronically Signed   By: Dorise Bullion III M.D   On: 08/31/2016 11:08   Dg Chest 2 View  Result Date: 08/29/2016 CLINICAL DATA:  Patient here with complaints of low oxygen level while at pulmonologist visit for follow up. Increased SOB x3 weeks. Denies chest  pain. Hx chf, a-fib, htn, ex-smoker. EXAM: CHEST  2 VIEW COMPARISON:  07/11/2014 FINDINGS: Patient has right-sided transvenous pacemaker with leads to the right atrium and right ventricle. Shallow lung inflation. Heart is mildly enlarged. There are patchy infiltrates involving the right lung and the left lower lobe, favoring infectious process. IMPRESSION: Interval development of bilateral airspace filling opacities, likely infectious in origin. Asymmetric pulmonary edema could have a similar appearance. Electronically Signed   By: Nolon Nations M.D.   On: 08/29/2016 18:20   Ct Chest Wo Contrast  Result Date: 08/29/2016 CLINICAL DATA:  Cough and dyspnea EXAM: CT CHEST WITHOUT CONTRAST TECHNIQUE: Multidetector CT imaging of the chest  was performed following the standard protocol without IV contrast. COMPARISON:  Chest CT 03/27/2015 FINDINGS: Cardiovascular: The heart is enlarged. There pacemaker leads in the right atrium, right ventricle and coronary sinus. No pericardial effusion. There are coronary artery atherosclerotic calcifications. There is extensive atherosclerotic calcification of the aorta. Main pulmonary artery is mildly enlarged. Mediastinum/Nodes: 12 mm level 4R lymph node. Other mediastinal nodes measure up to 13 mm. No axillary adenopathy. Normal thyroid. Lungs/Pleura: There is extensive consolidation throughout the right lung. There is a medium-sized right pleural effusion. There are locules of air within the effusion. There are additional scattered areas of consolidation in the left lung. Upper Abdomen: There is a 5 mm calculus at the right renal hilum. The partially visualized left renal pelvis is dilated. Musculoskeletal: No chest wall mass or suspicious bone lesions identified. IMPRESSION: 1. Multiple large areas of consolidation within both lungs, worse on the right. Differential diagnosis is broad, including multifocal pneumonia (favored), granulomatous disease such as sarcoidosis or  inhalation/hypersensitivity disease such as pneumoconiosis. Correlation with laboratory values and bronchoscopy/bronchoalveolar lavage may be helpful. 2.  Aortic Atherosclerosis (ICD10-I70.0). Electronically Signed   By: Ulyses Jarred M.D.   On: 08/29/2016 20:56   Dg Chest Port 1 View  Result Date: 09/13/2016 CLINICAL DATA:  Pneumonia. EXAM: PORTABLE CHEST 1 VIEW COMPARISON:  09/11/2016 .  09/10/2016.  CT 08/29/2016. FINDINGS: Cardiac pacer noted with lead tips in right atrium and right ventricle. Cardiomegaly with normal pulmonary vascularity. Low lung volumes with mild bibasilar atelectasis and infiltrates. No pleural effusion or pneumothorax. IMPRESSION: 1. Cardiac pacer with lead tips in right atrium and right ventricle. Cardiomegaly. 2. Low lung volumes with mild bibasilar atelectasis. Small bilateral pleural effusions . Exam stable from prior exam . Electronically Signed   By: [place]   On: 09/13/2016 07:30       Subjective:  patient seen and examined at bedside. He is awake and denies any overnight fever, nausea, vomiting.   Discharge Exam: Vitals:   09/15/16 2054 09/16/16 0551  BP: 138/69 (!) 153/69  Pulse: 72 81  Resp: 18   Temp: (!) 97.4 F (36.3 C) 98.1 F (36.7 C)   Vitals:   09/15/16 0501 09/15/16 1329 09/15/16 2054 09/16/16 0551  BP: (!) 118/94 134/64 138/69 (!) 153/69  Pulse: 70 84 72 81  Resp: 18 18 18    Temp: 97.7 F (36.5 C) 97.8 F (36.6 C) (!) 97.4 F (36.3 C) 98.1 F (36.7 C)  TempSrc: Oral Oral Oral Oral  SpO2: 95% 96% 95% 94%  Weight: 96.3 kg (212 lb 4.8 oz)   93.4 kg (205 lb 14.4 oz)  Height:    5\' 9"  (1.753 m)    General: Pt is alert, awake, not in acute distress Cardiovascular: Rate controlled, S1/S2 +, Respiratory: bilateral decreased breath sounds at bases with scattered crackles  Abdominal: Soft, NT, ND, bowel sounds + Extremities: trace 1+ pitting pedal edema;  no cyanosis    The results of significant diagnostics from this  hospitalization (including imaging, microbiology, ancillary and laboratory) are listed below for reference.     Microbiology: Recent Results (from the past 240 hour(s))  Blood Culture (routine x 2)     Status: None (Preliminary result)   Collection Time: 09/11/16  7:16 PM  Result Value Ref Range Status   Specimen Description BLOOD RIGHT ANTECUBITAL  Final   Special Requests   Final    BOTTLES DRAWN AEROBIC AND ANAEROBIC Blood Culture adequate volume   Culture NO GROWTH 4  DAYS  Final   Report Status PENDING  Incomplete  Blood Culture (routine x 2)     Status: None (Preliminary result)   Collection Time: 09/11/16  7:20 PM  Result Value Ref Range Status   Specimen Description BLOOD RIGHT HAND  Final   Special Requests IN PEDIATRIC BOTTLE Blood Culture adequate volume  Final   Culture NO GROWTH 4 DAYS  Final   Report Status PENDING  Incomplete  Urine culture     Status: Abnormal   Collection Time: 09/11/16  7:36 PM  Result Value Ref Range Status   Specimen Description URINE, CATHETERIZED  Final   Special Requests NONE  Final   Culture 50,000 COLONIES/mL YEAST (A)  Final   Report Status 09/13/2016 FINAL  Final  MRSA PCR Screening     Status: None   Collection Time: 09/12/16  6:22 AM  Result Value Ref Range Status   MRSA by PCR NEGATIVE NEGATIVE Final    Comment:        The GeneXpert MRSA Assay (FDA approved for NASAL specimens only), is one component of a comprehensive MRSA colonization surveillance program. It is not intended to diagnose MRSA infection nor to guide or monitor treatment for MRSA infections.      Labs: BNP (last 3 results)  Recent Labs  08/29/16 1737  BNP 69.6   Basic Metabolic Panel:  Recent Labs Lab 09/11/16 1754 09/12/16 0406 09/12/16 2359 09/14/16 0310 09/15/16 0216 09/16/16 0152  NA 131* 136 136 137 139 140  K 4.5 4.6 4.1 4.4 3.8 3.9  CL 93* 104 107 109 110 107  CO2 24 20* 19* 20* 23 26  GLUCOSE 267* 215* 218* 271* 210* 178*  BUN 101*  85* 66* 54* 45* 34*  CREATININE 3.63* 2.88* 2.21* 1.84* 1.53* 1.47*  CALCIUM 10.1 8.4* 8.0* 8.3* 8.2* 8.2*  MG 2.6* 2.1 1.9  --   --  1.7  PHOS  --  5.0*  --   --   --   --    Liver Function Tests:  Recent Labs Lab 09/10/16 0919 09/11/16 1754 09/12/16 0406 09/16/16 0152  AST 22 31 24  32  ALT 32 36 22 39  ALKPHOS 68 82 63 81  BILITOT 0.9 1.2 1.3* 0.5  PROT 6.3 6.4* 4.9* 4.8*  ALBUMIN 3.2* 3.3* 2.5* 2.4*   No results for input(s): LIPASE, AMYLASE in the last 168 hours. No results for input(s): AMMONIA in the last 168 hours. CBC:  Recent Labs Lab 09/10/16 0919 09/11/16 1754 09/12/16 0406 09/14/16 0310 09/15/16 0216 09/16/16 0152  WBC 31.8* 29.7* 21.8* 25.2* 22.4* 20.1*  NEUTROABS 25.7* 27.0*  --   --   --  15.3*  HGB 14.9 15.6 13.0 12.8* 12.4* 12.6*  HCT 45.0 45.6 39.6 39.1 38.3* 38.7*  MCV 92.6 92.1 92.1 93.1 93.9 93.0  PLT 214.0 178 137* 134* 137* 126*   Cardiac Enzymes: No results for input(s): CKTOTAL, CKMB, CKMBINDEX, TROPONINI in the last 168 hours. BNP: Invalid input(s): POCBNP CBG:  Recent Labs Lab 09/15/16 0700 09/15/16 1105 09/15/16 1704 09/15/16 2100 09/16/16 0632  GLUCAP 152* 273* 369* 308* 154*   D-Dimer No results for input(s): DDIMER in the last 72 hours. Hgb A1c No results for input(s): HGBA1C in the last 72 hours. Lipid Profile No results for input(s): CHOL, HDL, LDLCALC, TRIG, CHOLHDL, LDLDIRECT in the last 72 hours. Thyroid function studies No results for input(s): TSH, T4TOTAL, T3FREE, THYROIDAB in the last 72 hours.  Invalid input(s): FREET3 Anemia work  up No results for input(s): VITAMINB12, FOLATE, FERRITIN, TIBC, IRON, RETICCTPCT in the last 72 hours. Urinalysis    Component Value Date/Time   COLORURINE YELLOW 09/11/2016 1936   APPEARANCEUR HAZY (A) 09/11/2016 1936   LABSPEC 1.014 09/11/2016 1936   PHURINE 5.0 09/11/2016 1936   GLUCOSEU 50 (A) 09/11/2016 1936   HGBUR LARGE (A) 09/11/2016 1936   BILIRUBINUR NEGATIVE  09/11/2016 1936   KETONESUR NEGATIVE 09/11/2016 1936   PROTEINUR NEGATIVE 09/11/2016 1936   UROBILINOGEN 0.2 05/26/2012 0623   NITRITE NEGATIVE 09/11/2016 1936   LEUKOCYTESUR TRACE (A) 09/11/2016 1936   Sepsis Labs Invalid input(s): PROCALCITONIN,  WBC,  LACTICIDVEN Microbiology Recent Results (from the past 240 hour(s))  Blood Culture (routine x 2)     Status: None (Preliminary result)   Collection Time: 09/11/16  7:16 PM  Result Value Ref Range Status   Specimen Description BLOOD RIGHT ANTECUBITAL  Final   Special Requests   Final    BOTTLES DRAWN AEROBIC AND ANAEROBIC Blood Culture adequate volume   Culture NO GROWTH 4 DAYS  Final   Report Status PENDING  Incomplete  Blood Culture (routine x 2)     Status: None (Preliminary result)   Collection Time: 09/11/16  7:20 PM  Result Value Ref Range Status   Specimen Description BLOOD RIGHT HAND  Final   Special Requests IN PEDIATRIC BOTTLE Blood Culture adequate volume  Final   Culture NO GROWTH 4 DAYS  Final   Report Status PENDING  Incomplete  Urine culture     Status: Abnormal   Collection Time: 09/11/16  7:36 PM  Result Value Ref Range Status   Specimen Description URINE, CATHETERIZED  Final   Special Requests NONE  Final   Culture 50,000 COLONIES/mL YEAST (A)  Final   Report Status 09/13/2016 FINAL  Final  MRSA PCR Screening     Status: None   Collection Time: 09/12/16  6:22 AM  Result Value Ref Range Status   MRSA by PCR NEGATIVE NEGATIVE Final    Comment:        The GeneXpert MRSA Assay (FDA approved for NASAL specimens only), is one component of a comprehensive MRSA colonization surveillance program. It is not intended to diagnose MRSA infection nor to guide or monitor treatment for MRSA infections.      Time coordinating discharge: 35 minutes  SIGNED:   Aline August, MD  [facility] 09/16/2016, 9:57 AM Pager: [phone]  If 7PM-7AM, please contact night-coverage [email or web address] Password  [email or web address]

## 2016-09-16 NOTE — Care Management Note (Signed)
Case Management Note Marvetta Gibbons RN, BSN Unit 2W-Case Manager [phone]  Patient Details  Name: FENRIS CAUBLE MRN: [id number] Date of Birth: 05/13/1926  Subjective/Objective:   Pt admitted with ARF/sepsis                Action/Plan: PTA pt was from [facility] following for return to SNF  Expected Discharge Date:  09/16/16               Expected Discharge Plan:  [place] Referral:  Clinical Social Work  Discharge planning Services  CM Consult  Post Acute Care Choice:  NA Choice offered to:  NA  DME Arranged:    DME Agency:     HH Arranged:  NA HH Agency:  NA  Status of Service:  Completed, signed off  If discussed at [employer] of Stay Meetings, dates discussed:    Discharge Disposition: Skilled Facility   Additional Comments:  09/16/16- 1000- Taetum Flewellen RN, CM- pt stable for d/c back to [facility] today- CSW following for return to SNF  [place], [employer], RN 09/16/2016, 9:57 AM

## 2016-09-16 NOTE — Progress Notes (Signed)
Physical Therapy Treatment Patient Details Name: Randall Odom MRN: [id number] DOB: 1927-01-14 Today's Date: 09/16/2016    History of Present Illness 83 y.o. male from SNF with c/o confusion, SoB, decreased appetite, admitted for SIRS/sepsis dehydration resulting in acute renal failure. PMH of DM2, PPM, HTN, BPH, glaucoma, DJD, depression, CHB sp PPM, chronic combined CHF, CAD and atrial fibrillation     PT Comments    Pt making steady progress. Pt awaiting return to [facility].   Follow Up Recommendations  SNF     Equipment Recommendations  None recommended by PT    Recommendations for Other Services       Precautions / Restrictions Precautions Precautions: Fall Precaution Comments: monitor sats    Mobility  Bed Mobility Overal bed mobility: Needs Assistance Bed Mobility: Supine to Sit;Sit to Supine     Supine to sit: Min assist;HOB elevated Sit to supine: Supervision   General bed mobility comments: assist to elevate trunk into sitting  Transfers Overall transfer level: Needs assistance Equipment used: Rolling walker (2 wheeled) Transfers: Sit to/from Stand Sit to Stand: Min assist         General transfer comment: Assist to bring hips up and for balance  Ambulation/Gait Ambulation/Gait assistance: Min assist Ambulation Distance (Feet): 12 Feet (x 2) Assistive device: Rolling walker (2 wheeled) Gait Pattern/deviations: Step-through pattern;Decreased step length - right;Decreased step length - left;Decreased stride length;Trunk flexed;Shuffle Gait velocity: decr   General Gait Details: Assist for balance and support. Dyspnea 2/4 on RA.   Stairs            Wheelchair Mobility    Modified Rankin (Stroke Patients Only)       Balance Overall balance assessment: Needs assistance Sitting-balance support: No upper extremity supported;Feet supported Sitting balance-Leahy Scale: Fair     Standing balance support: Bilateral upper extremity  supported Standing balance-Leahy Scale: Poor Standing balance comment: UE support and min guard for static standing                            Cognition Arousal/Alertness: Awake/alert Behavior During Therapy: WFL for tasks assessed/performed Overall Cognitive Status: No family/caregiver present to determine baseline cognitive functioning                                 General Comments: h/o dementia       Exercises      General Comments        Pertinent Vitals/Pain Pain Assessment: No/denies pain    Home Living                      Prior Function            PT Goals (current goals can now be found in the care plan section) Progress towards PT goals: Progressing toward goals    Frequency    Min 2X/week      PT Plan Current plan remains appropriate    Co-evaluation              AM-PAC PT "6 Clicks" Daily Activity  Outcome Measure  Difficulty turning over in bed (including adjusting bedclothes, sheets and blankets)?: A Little Difficulty moving from lying on back to sitting on the side of the bed? : Total Difficulty sitting down on and standing up from a chair with arms (e.g., wheelchair, bedside commode, etc,.)?: Total Help needed moving to  and from a bed to chair (including a wheelchair)?: A Little Help needed walking in hospital room?: A Little Help needed climbing 3-5 steps with a railing? : Total 6 Click Score: 12    End of Session   Activity Tolerance: Patient limited by fatigue Patient left: in bed;with call bell/phone within reach;with bed alarm set Nurse Communication: Mobility status PT Visit Diagnosis: Unsteadiness on feet (R26.81);Other abnormalities of gait and mobility (R26.89);Repeated falls (R29.6);Muscle weakness (generalized) (M62.81);Difficulty in walking, not elsewhere classified (R26.2)     Time: 7414-2395 PT Time Calculation (min) (ACUTE ONLY): 12 min  Charges:  $Gait Training: 8-22 mins                     G Codes:       [facility] PT [place] 09/16/2016, 2:17 PM

## 2016-09-16 NOTE — Progress Notes (Signed)
Clinical Social Worker facilitated patient discharge including contacting patient family and facility to confirm patient discharge plans.  Clinical information faxed to facility and family agreeable with plan.  CSW arranged ambulance transport via [facility] to [facility] for 1:45-2pm  .  RN to call [phone] (pt will go into room 201) report prior to discharge.  Clinical Social Worker will sign off for now as social work intervention is no longer needed. Please consult [place] again if new need arises.  Rhea Pink, MSW, [place]

## 2016-09-26 ENCOUNTER — Ambulatory Visit: Payer: Medicare Other | Admitting: Internal Medicine

## 2016-10-09 ENCOUNTER — Ambulatory Visit (INDEPENDENT_AMBULATORY_CARE_PROVIDER_SITE_OTHER): Payer: Medicare Other | Admitting: Acute Care

## 2016-10-09 ENCOUNTER — Encounter: Payer: Self-pay | Admitting: Acute Care

## 2016-10-09 DIAGNOSIS — J9611 Chronic respiratory failure with hypoxia: Secondary | ICD-10-CM | POA: Diagnosis not present

## 2016-10-09 DIAGNOSIS — J189 Pneumonia, unspecified organism: Secondary | ICD-10-CM | POA: Diagnosis not present

## 2016-10-09 NOTE — Progress Notes (Signed)
History of Present Illness NORMAN PIACENTINI is a 83 y.o. male former smoker ( quit 1970) with history of DM2, PPM, HTN, BPH, glaucoma, DJD, depression, CHB sp PPM, chronic combined CHF, CAD and atrial fibrillation. He is followed by Dr. Melvyn Novas as a hospital follow-up/ and was seen by Dr. Lake Bells as an inpatient.   10/09/2016 Follow Up for CAP.  Pt.  was hospitalized 6/21-6/25/2018 for acute respiratory failure with hypoxemia with multilobar infiltrates. He was diagnosed with severe community acquired pneumonia but the symptoms are a bit unusual given the insidious onset.  He was treated IV  Steroids, diuretics and antibiotics, oxygen, and scheduled bronchodilators as an inpatient . He was discharged 6/ 25, with continued antibiotics for at least 14 days ( Levaquin)  as he appears to have necrotic pneumonia in the RLL. Prednisone was weaned over 10 days. He was discharged home on oxygen at 2 L nasal cannula. Patient presents today stating that he was compliant with completion of his Levaquin and his prednisone taper. He was seen as hospital follow-up by Dr. Melvyn Novas on   09/10/2016. He was continuing to improve. Chest x-ray showed improvement. Plan at that time was for possible bronchoscopy with sputum sampling in the event patient did not continue to improve. Then on 09/11/2016 Mr. Conde was readmitted  with Sepsis 2/2 UTI/ dehydration and acute renal failure. He was hypothermic with a temperature of 96.4 with elevated WBCs. He was treated with IV antibiotics ( Rocephin), IV fluids, and Diflucan. Urine culture at that time grew yeast. He continues to improve with further treatment and was eventually transferred out to a skilled facility for rehabilitation on 09/16/2016.Marland Kitchen He was discharged on Keflex and diflucan.He was subsequently discharged from [facility] skilled facility this past Sunday 10/06/2016, and has been managing well. Chest x-ray during second admission showed no evidence of pneumonia. Patient states he  is glad to be at home. Patient denies fever, chest pain, orthopnea, or hemoptysis. He denies any productive cough.  Test Results: CT chest without contrast>> 08/29/2016 Multiple large areas of consolidation within both lungs, worse on the right. Differential diagnosis is broad, including multifocal pneumonia (favored), granulomatous disease such as sarcoidosis or inhalation/hypersensitivity disease such as pneumoconiosis.  Chest x-ray 09/15/2016 Cardiomegaly without failure. Low volume chest with scar-like opacities at the bases. No evidence of superimposed pneumonia.   Chest x-ray 09/11/2016 Scarring or atelectasis at the bases, stable in appearance. 2.  No evidence for acute  abnormality.  Urine culture 09/11/2016>> 50,000 colonies yeast  - Quant TB 08/31/16  Neg - Sputum AFB x 2 09/01/16> smear neg   - ESR   08/30/16  = 46  On prednisone 10  taper schedule to off x 1 more week - cxr's and clinical course most c/w boop ? Related to atypical infection but TB has been excluded and cxr is markedly improved  CBC Latest Ref Rng & Units 09/16/2016 09/15/2016 09/14/2016  WBC 4.0 - 10.5 K/uL 20.1(H) 22.4(H) 25.2(H)  Hemoglobin 13.0 - 17.0 g/dL 12.6(L) 12.4(L) 12.8(L)  Hematocrit 39.0 - 52.0 % 38.7(L) 38.3(L) 39.1  Platelets 150 - 400 K/uL 126(L) 137(L) 134(L)    BMP Latest Ref Rng & Units 09/16/2016 09/15/2016 09/14/2016  Glucose 65 - 99 mg/dL 178(H) 210(H) 271(H)  BUN 6 - 20 mg/dL 34(H) 45(H) 54(H)  Creatinine 0.61 - 1.24 mg/dL 1.47(H) 1.53(H) 1.84(H)  Sodium 135 - 145 mmol/L 140 139 137  Potassium 3.5 - 5.1 mmol/L 3.9 3.8 4.4  Chloride 101 - 111 mmol/L  107 110 109  CO2 22 - 32 mmol/L 26 23 20(L)  Calcium 8.9 - 10.3 mg/dL 8.2(L) 8.2(L) 8.3(L)    BNP    Component Value Date/Time   BNP 93.0 08/29/2016 1737    ProBNP    Component Value Date/Time   PROBNP 30.0 09/10/2016 0919    PFT No results found for: FEV1PRE, FEV1POST, FVCPRE, FVCPOST, TLC, DLCOUNC, PREFEV1FVCRT,  PSTFEV1FVCRT  Ct Abdomen Pelvis Wo Contrast  Result Date: 09/11/2016 CLINICAL DATA:  Abdominal pain and sepsis of unknown etiology EXAM: CT ABDOMEN AND PELVIS WITHOUT CONTRAST TECHNIQUE: Multidetector CT imaging of the abdomen and pelvis was performed following the standard protocol without IV contrast. COMPARISON:  Chest CT from 08/29/2016 and CT AP from 02/18/2010 FINDINGS: Lower chest: Partially resolves lower lobe pulmonary consolidations and effusions with some residual streaky parenchymal areas of scarring and/or atelectasis remaining. There is coronary arteriosclerosis with right atrial and right ventricular pacing leads noted. Included heart is top-normal in size. There is no pericardial effusion. Hepatobiliary: No focal liver abnormality is seen. No gallstones, gallbladder wall thickening, or biliary dilatation. Pancreas: Atrophic appearing pancreas without ductal dilatation or mass. Spleen: No splenomegaly or focal lesions. Adrenals/Urinary Tract: Normal bilateral adrenal glands. Bilateral nonobstructing renal calculi with exophytic stable cyst off the interpolar right posterior cortex measuring 2.8 cm in diameter. The renal calculi. Medially there is mild caliectasis bilaterally. The largest renal calculus on the left is approximately 9 mm in length and on the right 14 mm. Nonspecific perinephric fat stranding is seen bilaterally which can be seen in the setting of urinary tract infections. Stomach/Bowel: The stomach is physiologically distended with normal small bowel rotation. Small epiphrenic nodular focus is stable measuring 13 mm in short axis and may represent a ephrenic diverticulum. This appears stable. Differential possibility might include an adjacent lymph node. Duodenal diverticula again noted. Small and large bowel loops demonstrate no obstruction or inflammation. The appendix is surgically absent. Minimal diverticulosis along the distal descending and sigmoid colon. Vascular/Lymphatic:  Aortoiliac and branch vessel atherosclerosis without aneurysm. No pathologically enlarged appearing lymph nodes. Reproductive: Prostate and seminal vesicles are within normal limits. Other: No abdominal wall hernia or abnormality. No abdominopelvic ascites. Musculoskeletal: Degenerative disc disease L5-S1 with grade 1 bordering on grade 2 spondylolisthesis of L5 on S1 secondary to bilateral L5 pars defects. No acute or suspicious osseous abnormality. IMPRESSION: 1. Partial resolution of bilateral lower lobe pulmonary consolidations since prior CT chest. 2. Scattered colonic diverticulosis along the distal descending sigmoid colon without acute diverticulitis. 3. Bilateral renal calculi, new since 02/18/2010 potentially representing evolving staghorn calculi with mild caliectasis. Given perinephric fat stranding, correlate to exclude urinary tract infection. Stable right upper pole 2.8 cm renal simple cyst. 4. Aortoiliac and branch vessel atherosclerosis. 5. Chronic L5 pars defects with grade 1 bordering on grade 2 spondylolisthesis of L5 on S1. Electronically Signed   By: Ashley Royalty M.D.   On: 09/11/2016 20:54   Dg Chest 2 View  Result Date: 09/15/2016 CLINICAL DATA:  Dyspnea EXAM: CHEST  2 VIEW COMPARISON:  09/13/2016 FINDINGS: Chronic cardiomegaly. There is a dual-chamber pacer from the right with duplicated right ventricular leads. Chronic cardiomegaly. Stable aortic tortuosity. Chronic interstitial type opacities at the bases. No Kerley lines, air bronchograms, or pneumothorax. IMPRESSION: 1. Cardiomegaly without failure. 2. Low volume chest with scar-like opacities at the bases. No evidence of superimposed pneumonia. Electronically Signed   By: Monte Fantasia M.D.   On: 09/15/2016 12:10   Dg Chest 2 View  Result Date: 09/11/2016 CLINICAL DATA:  Pt reports to the ED for eval of abnormal lab. Per EMS his BUN is elevated at 100. Pt reports generalized weakness "for a while". Hx of DM and HTN. Former smoker  EXAM: CHEST  2 VIEW COMPARISON:  09/10/2016 FINDINGS: Right-sided transvenous pacemaker leads overlie the right atrium and right ventricle. An orphan lead overlies the right anterior chest. Heart size is normal. There is bibasilar atelectasis or scarring. There are no focal consolidations. Small left pleural effusion is noted. No pulmonary edema. IMPRESSION: 1. Scarring or atelectasis at the bases, stable in appearance. 2.  No evidence for acute  abnormality. Electronically Signed   By: Nolon Nations M.D.   On: 09/11/2016 18:31   Dg Chest 2 View  Result Date: 09/10/2016 CLINICAL DATA:  Dyspnea on exertion.  History of pneumonia. EXAM: CHEST  2 VIEW COMPARISON:  09/02/2016 FINDINGS: Hyperinflation. Accentuation of expected thoracic kyphosis with mild loss of vertebral body height at mid thoracic levels. Pacer/AICD device. Midline trachea. The Chin overlies the apices. Normal heart size. Tortuous thoracic aorta. Atherosclerosis in the transverse aorta. Probable trace left pleural fluid. No pneumothorax. Improved bibasilar aeration, with patchy left base airspace disease and minimal probable right base scarring remaining. No new pulmonary opacity. IMPRESSION: Overall improved bibasilar aeration. Mild left base pulmonary opacity remains, favored to represent atelectasis. Residual pneumonia cannot be entirely excluded. No new superimposed process. Aortic Atherosclerosis (ICD10-I70.0). Electronically Signed   By: Abigail Miyamoto M.D.   On: 09/10/2016 09:49   Dg Chest Port 1 View  Result Date: 09/13/2016 CLINICAL DATA:  Pneumonia. EXAM: PORTABLE CHEST 1 VIEW COMPARISON:  09/11/2016 .  09/10/2016.  CT 08/29/2016. FINDINGS: Cardiac pacer noted with lead tips in right atrium and right ventricle. Cardiomegaly with normal pulmonary vascularity. Low lung volumes with mild bibasilar atelectasis and infiltrates. No pleural effusion or pneumothorax. IMPRESSION: 1. Cardiac pacer with lead tips in right atrium and right  ventricle. Cardiomegaly. 2. Low lung volumes with mild bibasilar atelectasis. Small bilateral pleural effusions . Exam stable from prior exam . Electronically Signed   By: [place]   On: 09/13/2016 07:30     Past medical hx Past Medical History:  Diagnosis Date  . Allergic rhinitis   . Anxiety   . Atrial fibrillation with controlled ventricular response ([place])   . CAD (coronary artery disease)   . Carpal tunnel syndrome   . Chronic combined systolic and diastolic CHF (congestive heart failure) ([place])   . Complete heart block ([place])   . Depressive disorder, not elsewhere classified   . Diabetes mellitus   . Diverticulosis of colon (without mention of hemorrhage)   . DJD (degenerative joint disease)   . Enlarged prostate   . Erectile dysfunction   . Glaucoma    History of glaucoma  . History of BPH   . Hypertension   . Pure hypercholesterolemia   . Recurrent falls   . Sacroiliitis, not elsewhere classified ([place])   . Thoracic or lumbosacral neuritis or radiculitis, unspecified      Social History  Substance Use Topics  . Smoking status: Former Smoker    Packs/day: 1.50    Years: 25.00    Types: Cigarettes    Quit date: 03/11/1968  . Smokeless tobacco: Never Used  . Alcohol use No    Mr.Wynes reports that he quit smoking about 48 years ago. His smoking use included Cigarettes. He has a 37.50 pack-year smoking history. He has never used smokeless tobacco. He reports  that he does not drink alcohol or use drugs.  Tobacco Cessation: Former smoker quit in 1970 with a 37.5 pack years smoking history  Past surgical hx, Family hx, Social hx all reviewed.  Current Outpatient Prescriptions on File Prior to Visit  Medication Sig  . apixaban (ELIQUIS) 5 MG TABS tablet TAKE 1 TABLET (5 MG TOTAL) BY MOUTH 2 (TWO) TIMES DAILY. (Patient taking differently: Take 5 mg by mouth 2 (two) times daily. )  . Cholecalciferol (VITAMIN D) 2000 UNITS tablet Take 2,000 Units by mouth daily.  .  fluticasone (FLONASE) 50 MCG/ACT nasal spray Place 2 sprays into the nose daily as needed for allergies. (Patient taking differently: Place 2 sprays into both nostrils daily as needed for allergies. )  . furosemide (LASIX) 40 MG tablet Take 1 tablet (40 mg total) by mouth daily.  Marland Kitchen glipiZIDE (GLUCOTROL) 5 MG tablet Take 5 mg by mouth daily before breakfast.  . guaiFENesin (MUCINEX) 600 MG 12 hr tablet Take 1 tablet (600 mg total) by mouth 2 (two) times daily as needed for cough or to loosen phlegm.  . Multiple Vitamin (MULTIVITAMIN WITH MINERALS) TABS Take 1 tablet by mouth daily.  . simvastatin (ZOCOR) 40 MG tablet Take 1 tablet (40 mg total) by mouth at bedtime.  Marland Kitchen terazosin (HYTRIN) 5 MG capsule Take 1 capsule (5 mg total) by mouth at bedtime.  . TRAVATAN Z 0.004 % SOLN ophthalmic solution Place 1 drop into both eyes at bedtime.   No current facility-administered medications on file prior to visit.      Allergies  Allergen Reactions  . Spironolactone Diarrhea and Nausea And Vomiting    Review Of Systems:  Constitutional:   No  weight loss, night sweats,  Fevers, chills, +fatigue, or  lassitude.  HEENT:   No headaches,  Difficulty swallowing,  Tooth/dental problems, or  Sore throat,                No sneezing, itching, ear ache, nasal congestion, post nasal drip,   CV:  No chest pain,  Orthopnea, PND, +swelling in lower extremities, no anasarca, dizziness, palpitations, syncope.   GI  No heartburn, indigestion, abdominal pain, nausea, vomiting, diarrhea, change in bowel habits, loss of appetite, bloody stools.   Resp: No shortness of breath with exertion or at rest.  No excess mucus, no productive cough,  No non-productive cough,  No coughing up of blood.  No change in color of mucus.  No wheezing.  No chest wall deformity  Skin: no rash or lesions.  GU: no dysuria, change in color of urine, no urgency or frequency.  No flank pain, no hematuria   MS:  No joint pain or swelling.   No decreased range of motion.  No back pain.  Psych:  No change in mood or affect. No depression or anxiety.  No memory loss.   Vital Signs BP 128/70 (BP Location: Left Arm, Patient Position: Sitting, Cuff Size: Normal)   Pulse 79   Ht 5' 7"  (1.702 m)   Wt 201 lb 3.2 oz (91.3 kg)   SpO2 98%   BMI 31.51 kg/m    Physical Exam:  General- No distress,  A&Ox3, very hard of hearing ENT: No sinus tenderness, TM clear, pale nasal mucosa, no oral exudate,no post nasal drip, no LAN, no JVD Cardiac: S1, S2, regular rate and rhythm, no murmur Chest: No wheeze/ rales/ dullness; no accessory muscle use, no nasal flaring, no sternal retractions, diminished in the bases bilaterally Abd.:  Soft Non-tender, nontender, bowel sounds positive, obese Ext: No clubbing cyanosis, 1+ lower extremity bilateral edema Neuro:  Frail and deconditioned at baseline using a walker, cranial nerves intact, very hard of hearing Skin: No rashes, warm and dry Psych: normal mood and behavior   Assessment/Plan  Community acquired pneumonia Resolved/ no further antibiotics indicated Radiological clearance of infiltrate Patient was treated with additional Keflex and Diflucan secondary to sepsis/UTI post discharge and readmit Clinically does not appear infected Plan Continue your Flonase as needed for sinus congestion. Use your Mucinex as needed for chest congestion. Follow up with Dr. Melvyn Novas in 4 months Please contact office for sooner follow up if symptoms do not improve or worsen or seek emergency care    Chronic respiratory failure with hypoxia ([place]) Wear oxygen as prescribed Saturation goals are 88-92 percent Follow-up with Dr. Melvyn Novas in 4 months Flu shot in the fall    Magdalen Spatz, NP 10/09/2016  10:36 PM

## 2016-10-09 NOTE — Assessment & Plan Note (Signed)
Wear oxygen as prescribed Saturation goals are 88-92 percent Follow-up with Dr. Melvyn Novas in 4 months Flu shot in the fall

## 2016-10-09 NOTE — Patient Instructions (Signed)
It is nice to meet you today. Continue your Flonase as needed for sinus congestion. Use your Mucinex as needed for chest congestion. Follow up with Dr. Melvyn Novas in 4 months Please contact office for sooner follow up if symptoms do not improve or worsen or seek emergency care

## 2016-10-09 NOTE — Assessment & Plan Note (Addendum)
Resolved/ no further antibiotics indicated Radiological clearance of infiltrate Patient was treated with additional Keflex and Diflucan secondary to sepsis/UTI post discharge and readmit Clinically does not appear infected Plan Continue your Flonase as needed for sinus congestion. Use your Mucinex as needed for chest congestion. Follow up with Dr. Melvyn Novas in 4 months Please contact office for sooner follow up if symptoms do not improve or worsen or seek emergency care

## 2016-10-10 NOTE — Progress Notes (Signed)
Chart and office note reviewed in detail along with available xrays/ labs > agree with a/p as outlined  

## 2016-10-15 LAB — ACID FAST CULTURE WITH REFLEXED SENSITIVITIES (MYCOBACTERIA): Acid Fast Culture: NEGATIVE

## 2016-11-18 ENCOUNTER — Other Ambulatory Visit: Payer: Self-pay | Admitting: Interventional Cardiology

## 2016-11-18 ENCOUNTER — Ambulatory Visit (INDEPENDENT_AMBULATORY_CARE_PROVIDER_SITE_OTHER): Payer: Medicare Other | Admitting: *Deleted

## 2016-11-18 DIAGNOSIS — I442 Atrioventricular block, complete: Secondary | ICD-10-CM | POA: Diagnosis not present

## 2016-11-18 NOTE — Progress Notes (Signed)
Remote pacemaker transmission.   

## 2016-11-19 NOTE — Telephone Encounter (Signed)
Pt last saw Dr Tamala Julian on 07/10/16, last labs in hospital 09/16/16 Creat 1.47, 2 wks prior on 09/01/16 Creat 1.07, age 83, weight 91.3kg, based on specified criteria pt is on appropriate dosage of Eliquis 5mg  BID.  Will refill rx.

## 2016-11-20 ENCOUNTER — Encounter: Payer: Self-pay | Admitting: Cardiology

## 2016-11-21 LAB — CUP PACEART REMOTE DEVICE CHECK
Date Time Interrogation Session: 20180913114140
Implantable Lead Implant Date: 19990721
Implantable Pulse Generator Implant Date: 20170907
Lead Channel Setting Pacing Amplitude: 1.75 V
Lead Channel Setting Sensing Sensitivity: 2 mV
MDC IDC LEAD LOCATION: 753860
MDC IDC SET LEADCHNL RV PACING PULSEWIDTH: 0.5 ms
Pulse Gen Model: 1272
Pulse Gen Serial Number: 7880882

## 2017-02-10 ENCOUNTER — Ambulatory Visit: Payer: Medicare Other | Admitting: Internal Medicine

## 2017-02-17 ENCOUNTER — Ambulatory Visit (INDEPENDENT_AMBULATORY_CARE_PROVIDER_SITE_OTHER): Payer: Medicare Other | Admitting: *Deleted

## 2017-02-17 DIAGNOSIS — I442 Atrioventricular block, complete: Secondary | ICD-10-CM | POA: Diagnosis not present

## 2017-02-17 NOTE — Progress Notes (Signed)
Remote pacemaker transmission.   

## 2017-02-21 ENCOUNTER — Encounter: Payer: Self-pay | Admitting: Cardiology

## 2017-02-26 ENCOUNTER — Encounter: Payer: Medicare Other | Admitting: Nurse Practitioner

## 2017-03-13 LAB — CUP PACEART REMOTE DEVICE CHECK
Battery Remaining Longevity: 125 mo
Battery Remaining Percentage: 95.5 %
Brady Statistic RV Percent Paced: 88 %
Implantable Lead Location: 753860
Lead Channel Sensing Intrinsic Amplitude: 12 mV
Lead Channel Setting Pacing Amplitude: 1.75 V
Lead Channel Setting Sensing Sensitivity: 2 mV
MDC IDC LEAD IMPLANT DT: 19990721
MDC IDC MSMT BATTERY VOLTAGE: 3.01 V
MDC IDC MSMT LEADCHNL RV IMPEDANCE VALUE: 580 Ohm
MDC IDC MSMT LEADCHNL RV PACING THRESHOLD AMPLITUDE: 1.5 V
MDC IDC MSMT LEADCHNL RV PACING THRESHOLD PULSEWIDTH: 0.5 ms
MDC IDC PG IMPLANT DT: 20170907
MDC IDC PG SERIAL: 7880882
MDC IDC SESS DTM: 20181210070015
MDC IDC SET LEADCHNL RV PACING PULSEWIDTH: 0.5 ms

## 2017-03-17 NOTE — Progress Notes (Signed)
Electrophysiology Office Note Date: 03/19/2017  ID:  Randall Odom, DOB [birth year], MRN [id number]  PCP: Prince Solian, MD Primary Cardiologist: Tamala Julian Electrophysiologist: Allred  CC: Pacemaker follow-up  Randall Odom is a 84 y.o. male seen today for Dr Rayann Heman.  He presents today for routine electrophysiology followup.  Since last being seen in our clinic, the patient reports doing relatively well.  He remains sedentary but walks around the house and up the driveway with stable shortness of breath. He was admitted 7/18 with urosepsis. Blood cultures were negative. He was at [facility] for a short time but is now back at home.  He denies chest pain, palpitations, PND, orthopnea, nausea, vomiting, dizziness, syncope, edema, weight gain, or early satiety.  Device History: STJ single chamber PPM implanted 1989 for CHB; upgrade to STJ dual chamber PPM 1999; gen change 2006; gen change 2017   Past Medical History:  Diagnosis Date  . Allergic rhinitis   . Anxiety   . Atrial fibrillation with controlled ventricular response ([place])   . CAD (coronary artery disease)   . Carpal tunnel syndrome   . Chronic combined systolic and diastolic CHF (congestive heart failure) ([place])   . Complete heart block ([place])   . Depressive disorder, not elsewhere classified   . Diabetes mellitus   . Diverticulosis of colon (without mention of hemorrhage)   . DJD (degenerative joint disease)   . Enlarged prostate   . Erectile dysfunction   . Glaucoma    History of glaucoma  . History of BPH   . Hypertension   . Pure hypercholesterolemia   . Recurrent falls   . Sacroiliitis, not elsewhere classified ([place])   . Thoracic or lumbosacral neuritis or radiculitis, unspecified    Past Surgical History:  Procedure Laterality Date  . APPENDECTOMY  1966  . EP IMPLANTABLE DEVICE N/A 11/16/2015   PPM generator change by Dr Rayann Heman,  SJM Assurity SR PPM for complete heart block  . PACEMAKER INSERTION  09/28/1997     DDD for high-grade AV Block. Had an upgrade to a dual chamber device on 09/09/97. Replaced dual chamber battery on 12/06/04. New device is a Financial risk analyst Identity ADX XL DR, model [id number], seriel [id number].  . TONSILLECTOMY  1953    Current Outpatient Medications  Medication Sig Dispense Refill  . Cholecalciferol (VITAMIN D) 2000 UNITS tablet Take 2,000 Units by mouth daily.    Marland Kitchen ELIQUIS 5 MG TABS tablet TAKE 1 TABLET BY MOUTH TWICE A DAY 60 tablet 5  . fluticasone (FLONASE) 50 MCG/ACT nasal spray Place 2 sprays into the nose daily as needed for allergies. (Patient taking differently: Place 2 sprays into both nostrils daily as needed for allergies. )    . furosemide (LASIX) 40 MG tablet Take 1 tablet (40 mg total) by mouth daily. 30 tablet 0  . glipiZIDE (GLUCOTROL) 5 MG tablet Take 5 mg by mouth daily before breakfast.    . guaiFENesin (MUCINEX) 600 MG 12 hr tablet Take 1 tablet (600 mg total) by mouth 2 (two) times daily as needed for cough or to loosen phlegm. 20 tablet 0  . lisinopril (PRINIVIL,ZESTRIL) 5 MG tablet Take 5 mg by mouth daily.    . Multiple Vitamin (MULTIVITAMIN WITH MINERALS) TABS Take 1 tablet by mouth daily.    . simvastatin (ZOCOR) 40 MG tablet Take 1 tablet (40 mg total) by mouth at bedtime. 30 tablet 1  . terazosin (HYTRIN) 5 MG capsule Take 1 capsule (5 mg total) by  mouth at bedtime. 30 capsule 1  . TRAVATAN Z 0.004 % SOLN ophthalmic solution Place 1 drop into both eyes at bedtime.  4   No current facility-administered medications for this visit.     Allergies:   Spironolactone   Social History: Social History   Socioeconomic History  . Marital status: Married    Spouse name: Not on file  . Number of children: Not on file  . Years of education: Not on file  . Highest education level: Not on file  Social Needs  . Financial resource strain: Not on file  . Food insecurity - worry: Not on file  . Food insecurity - inability: Not on file  . Transportation needs -  medical: Not on file  . Transportation needs - non-medical: Not on file  Occupational History  . Occupation: Retired  Tobacco Use  . Smoking status: Former Smoker    Packs/day: 1.50    Years: 25.00    Pack years: 37.50    Types: Cigarettes    Last attempt to quit: 03/11/1968    Years since quitting: 49.0  . Smokeless tobacco: Never Used  Substance and Sexual Activity  . Alcohol use: No  . Drug use: No  . Sexual activity: Not on file  Other Topics Concern  . Not on file  Social History Narrative  . Not on file    Family History: Family History  Problem Relation Age of Onset  . Cancer Mother        breast  . CVA Father   . Anemia Brother   . Heart disease Brother      Review of Systems: All other systems reviewed and are otherwise negative except as noted above.   Physical Exam: VS:  BP (!) 110/58   Pulse 84   Ht 5\' 7"  (1.702 m)   Wt 196 lb (88.9 kg)   BMI 30.70 kg/m  , BMI Body mass index is 30.7 kg/m.  GEN- The patient is elderly and obese appearing, alert and oriented x 3 today.   HEENT: normocephalic, atraumatic; sclera clear, conjunctiva pink; hearing intact; oropharynx clear; neck supple Lungs- Clear to ausculation bilaterally, normal work of breathing.  No wheezes, rales, rhonchi Heart- Irregular rate and rhythm GI- soft, non-tender, non-distended, bowel sounds present Extremities- no clubbing, cyanosis, or edema MS- no significant deformity or atrophy Skin- warm and dry, no rash or lesion; PPM pocket well healed Psych- euthymic mood, full affect Neuro- strength and sensation are intact  PPM Interrogation- reviewed in detail today,  See PACEART report  EKG:  EKG is ordered today. EKG demonstrated atrial fibrillation with ventricular pacing  Recent Labs: 08/29/2016: B Natriuretic Peptide 93.0 09/10/2016: Pro B Natriuretic peptide (BNP) 30.0 09/11/2016: TSH 1.192 09/16/2016: ALT 39; BUN 34; Creatinine, Ser 1.47; Hemoglobin 12.6; Magnesium 1.7; Platelets  126; Potassium 3.9; Sodium 140   Wt Readings from Last 3 Encounters:  03/19/17 196 lb (88.9 kg)  10/09/16 201 lb 3.2 oz (91.3 kg)  09/16/16 205 lb 14.4 oz (93.4 kg)     Other studies Reviewed: Additional studies/ records that were reviewed today include: Dr Rayann Heman and Dr Thompson Caul office notes  Assessment and Plan:  1.  Complete heart block Normal PPM function See Pace Art report No changes today  2.  Permanent atrial fibrillation Continue Eliquis for CHADS2VASC of at least 6 V rates controlled  3.  Chronic systolic dysfunction Euvolemic on exam Continue current therapy   Current medicines are reviewed at length with the  patient today.   The patient does not have concerns regarding his medicines.  The following changes were made today:  none  Labs/ tests ordered today include: none  Disposition:   Follow up with Dr Tamala Julian as scheduled, Mer[place], me in 1 year   Signed, Chanetta Marshall, NP 03/19/2017 10:28 AM  [place] [address] [place] [address] [phone] (office) [phone] (fax)

## 2017-03-19 ENCOUNTER — Encounter: Payer: Self-pay | Admitting: Nurse Practitioner

## 2017-03-19 ENCOUNTER — Ambulatory Visit: Payer: Medicare Other | Admitting: Nurse Practitioner

## 2017-03-19 VITALS — BP 110/58 | HR 84 | Ht 67.0 in | Wt 196.0 lb

## 2017-03-19 DIAGNOSIS — I482 Chronic atrial fibrillation: Secondary | ICD-10-CM

## 2017-03-19 DIAGNOSIS — I5022 Chronic systolic (congestive) heart failure: Secondary | ICD-10-CM | POA: Diagnosis not present

## 2017-03-19 DIAGNOSIS — I442 Atrioventricular block, complete: Secondary | ICD-10-CM

## 2017-03-19 DIAGNOSIS — I4821 Permanent atrial fibrillation: Secondary | ICD-10-CM

## 2017-03-19 NOTE — Patient Instructions (Addendum)
Medication Instructions:   Your physician recommends that you continue on your current medications as directed. Please refer to the Current Medication list given to you today.   If you need a refill on your cardiac medications before your next appointment, please call your pharmacy.  Labwork: NONE ORDERED  TODAY     Testing/Procedures: NONE ORDERED  TODAY    Follow-Up:   WITH DR Tamala Julian IN MAY   Your physician wants you to follow-up in: [place] will receive a reminder letter in the mail two months in advance. If you don't receive a letter, please call our office to schedule the follow-up appointment.    Remote monitoring is used to monitor your Pacemaker of ICD from home. This monitoring reduces the number of office visits required to check your device to one time per year. It allows [place] to keep an eye on the functioning of your device to ensure it is working properly. You are scheduled for a device check from home on .  05-19-17  You may send your transmission at any time that day. If you have a wireless device, the transmission will be sent automatically. After your physician reviews your transmission, you will receive a postcard with your next transmission date.     Any Other Special Instructions Will Be Listed Below (If Applicable).

## 2017-04-02 LAB — CUP PACEART INCLINIC DEVICE CHECK
Implantable Lead Implant Date: 19990721
Implantable Pulse Generator Implant Date: 20170907
MDC IDC LEAD LOCATION: 753860
MDC IDC PG SERIAL: 7880882
MDC IDC SESS DTM: 20190123083610

## 2017-05-19 ENCOUNTER — Ambulatory Visit (INDEPENDENT_AMBULATORY_CARE_PROVIDER_SITE_OTHER): Payer: Medicare Other | Admitting: *Deleted

## 2017-05-19 DIAGNOSIS — I442 Atrioventricular block, complete: Secondary | ICD-10-CM

## 2017-05-19 NOTE — Progress Notes (Signed)
Remote pacemaker transmission.   

## 2017-05-21 ENCOUNTER — Encounter: Payer: Self-pay | Admitting: Cardiology

## 2017-05-23 ENCOUNTER — Other Ambulatory Visit: Payer: Self-pay | Admitting: Interventional Cardiology

## 2017-06-07 LAB — CUP PACEART REMOTE DEVICE CHECK
Battery Remaining Longevity: 127 mo
Brady Statistic RV Percent Paced: 83 %
Implantable Lead Location: 753860
Implantable Pulse Generator Implant Date: 20170907
Lead Channel Sensing Intrinsic Amplitude: 12 mV
Lead Channel Setting Pacing Amplitude: 1.625
Lead Channel Setting Sensing Sensitivity: 2 mV
MDC IDC LEAD IMPLANT DT: 19990721
MDC IDC MSMT BATTERY REMAINING PERCENTAGE: 95.5 %
MDC IDC MSMT BATTERY VOLTAGE: 3.01 V
MDC IDC MSMT LEADCHNL RV IMPEDANCE VALUE: 580 Ohm
MDC IDC MSMT LEADCHNL RV PACING THRESHOLD AMPLITUDE: 1.375 V
MDC IDC MSMT LEADCHNL RV PACING THRESHOLD PULSEWIDTH: 0.5 ms
MDC IDC PG SERIAL: 7880882
MDC IDC SESS DTM: 20190311070013
MDC IDC SET LEADCHNL RV PACING PULSEWIDTH: 0.5 ms

## 2017-07-16 ENCOUNTER — Other Ambulatory Visit: Payer: Self-pay | Admitting: Internal Medicine

## 2017-07-22 NOTE — Progress Notes (Signed)
Cardiology Office Note    Date:  07/23/2017   ID:  [name], DOB March 16, 1926, MRN [id number]  PCP:  Prince Solian, MD  Cardiologist: Sinclair Grooms, MD   Chief Complaint  Patient presents with  . Atrial Fibrillation  . Coronary Artery Disease  . Congestive Heart Failure    History of Present Illness:  Randall Odom is a 84 y.o. male  who presents for follow-up of Chronic combined systolic and diastolic heart failure, chronic atrial fibrillation, and known coronary calcification. He is on chronic anticoagulation without complications.  Cognitive impairment noted on today's exam.  Here for yearly routine cardiology follow-up.  He denies significant cardiac complaints.  He has permanent pacemaker for complete heart block implanted years ago.  He has chronic combined systolic and diastolic heart failure but no dyspnea.  He denies orthopnea.  There is been no lower extremity swelling.  His appetite is good.  He has not used nitroglycerin.  He is accompanied by his wife and son.   Past Medical History:  Diagnosis Date  . Allergic rhinitis   . Anxiety   . Atrial fibrillation with controlled ventricular response ([place])   . CAD (coronary artery disease)   . Carpal tunnel syndrome   . Chronic combined systolic and diastolic CHF (congestive heart failure) ([place])   . Complete heart block ([place])   . Depressive disorder, not elsewhere classified   . Diabetes mellitus   . Diverticulosis of colon (without mention of hemorrhage)   . DJD (degenerative joint disease)   . Enlarged prostate   . Erectile dysfunction   . Glaucoma    History of glaucoma  . History of BPH   . Hypertension   . Pure hypercholesterolemia   . Recurrent falls   . Sacroiliitis, not elsewhere classified ([place])   . Thoracic or lumbosacral neuritis or radiculitis, unspecified     Past Surgical History:  Procedure Laterality Date  . APPENDECTOMY  1966  . EP IMPLANTABLE DEVICE N/A 11/16/2015   PPM generator  change by Dr Rayann Heman,  SJM Assurity SR PPM for complete heart block  . PACEMAKER INSERTION  09/28/1997   DDD for high-grade AV Block. Had an upgrade to a dual chamber device on 09/09/97. Replaced dual chamber battery on 12/06/04. New device is a Financial risk analyst Identity ADX XL DR, model [id number], seriel [id number].  . TONSILLECTOMY  1953    Current Medications: Outpatient Medications Prior to Visit  Medication Sig Dispense Refill  . Cholecalciferol (VITAMIN D) 2000 UNITS tablet Take 2,000 Units by mouth daily.    Marland Kitchen ELIQUIS 5 MG TABS tablet TAKE 1 TABLET BY MOUTH TWICE A DAY 60 tablet 1  . fluticasone (FLONASE) 50 MCG/ACT nasal spray Place 2 sprays into the nose daily as needed for allergies. (Patient taking differently: Place 2 sprays into both nostrils daily as needed for allergies. )    . furosemide (LASIX) 40 MG tablet Take 1 tablet (40 mg total) by mouth daily. 30 tablet 0  . glipiZIDE (GLUCOTROL) 5 MG tablet Take 5 mg by mouth daily before breakfast.    . guaiFENesin (MUCINEX) 600 MG 12 hr tablet Take 1 tablet (600 mg total) by mouth 2 (two) times daily as needed for cough or to loosen phlegm. 20 tablet 0  . KLOR-CON M20 20 MEQ tablet Take 20 mEq by mouth daily.  2  . lisinopril (PRINIVIL,ZESTRIL) 5 MG tablet Take 5 mg by mouth daily.    . Multiple Vitamin (MULTIVITAMIN WITH  MINERALS) TABS Take 1 tablet by mouth daily.    . simvastatin (ZOCOR) 40 MG tablet Take 1 tablet (40 mg total) by mouth at bedtime. 30 tablet 1  . terazosin (HYTRIN) 5 MG capsule Take 1 capsule (5 mg total) by mouth at bedtime. 30 capsule 1  . TRAVATAN Z 0.004 % SOLN ophthalmic solution Place 1 drop into both eyes at bedtime.  4   No facility-administered medications prior to visit.      Allergies:   Spironolactone   Social History   Socioeconomic History  . Marital status: Married    Spouse name: Not on file  . Number of children: Not on file  . Years of education: Not on file  . Highest education level: Not on file    Occupational History  . Occupation: Retired  Scientific laboratory technician  . Financial resource strain: Not on file  . Food insecurity:    Worry: Not on file    Inability: Not on file  . Transportation needs:    Medical: Not on file    Non-medical: Not on file  Tobacco Use  . Smoking status: Former Smoker    Packs/day: 1.50    Years: 25.00    Pack years: 37.50    Types: Cigarettes    Last attempt to quit: 03/11/1968    Years since quitting: 49.4  . Smokeless tobacco: Never Used  Substance and Sexual Activity  . Alcohol use: No  . Drug use: No  . Sexual activity: Not on file  Lifestyle  . Physical activity:    Days per week: Not on file    Minutes per session: Not on file  . Stress: Not on file  Relationships  . Social connections:    Talks on phone: Not on file    Gets together: Not on file    Attends religious service: Not on file    Active member of club or organization: Not on file    Attends meetings of clubs or organizations: Not on file    Relationship status: Not on file  Other Topics Concern  . Not on file  Social History Narrative  . Not on file     Family History:  The patient's family history includes Anemia in his brother; CVA in his father; Cancer in his mother; Heart disease in his brother.   ROS:   Please see the history of present illness.    Doing okay.  No orthopnea, PND, or edema.  He states there is no memory issues.  He repeats several things from our conversation. All other systems reviewed and are negative.   PHYSICAL EXAM:   VS:  BP 114/70   Pulse 89   Ht 5\' 7"  (1.702 m)   Wt 212 lb (96.2 kg)   BMI 33.20 kg/m    GEN: Well nourished, well developed, in no acute distress  HEENT: normal  Neck: no JVD, carotid bruits, or masses Cardiac: RRR; no murmurs, rubs, or gallops,no edema  Respiratory:  clear to auscultation bilaterally, normal work of breathing GI: soft, nontender, nondistended, + BS MS: no deformity or atrophy  Skin: warm and dry, no  rash Neuro:  Alert and Oriented x 3, decreased memory with altered conversation and repeating of topics. Psych: euthymic mood, full affect  Wt Readings from Last 3 Encounters:  07/23/17 212 lb (96.2 kg)  03/19/17 196 lb (88.9 kg)  10/09/16 201 lb 3.2 oz (91.3 kg)      Studies/Labs Reviewed:   1. EKG:  EKG  Most recent EKG from January 2019 demonstrates atrial fibrillation with ventricular pacing.  No change compared to prior.  EKG was personally reviewed.    Recent Labs: 08/29/2016: B Natriuretic Peptide 93.0 09/10/2016: Pro B Natriuretic peptide (BNP) 30.0 09/11/2016: TSH 1.192 09/16/2016: ALT 39; BUN 34; Creatinine, Ser 1.47; Hemoglobin 12.6; Magnesium 1.7; Platelets 126; Potassium 3.9; Sodium 140   Lipid Panel No results found for: CHOL, TRIG, HDL, CHOLHDL, VLDL, LDLCALC, LDLDIRECT  Additional studies/ records that were reviewed today include:   2D Doppler echocardiogram performed June 2018  Study Conclusions   - Left ventricle: The cavity size was normal. There was moderate   concentric hypertrophy. Systolic function was mildly to   moderately reduced. The estimated ejection fraction was in the   range of 40% to 45%. Akinesis of the mid-apical anteroseptal,   inferoseptal, and apical myocardium. Hypokinesis of the anterior   myocardium. The study is not technically sufficient to allow   evaluation of LV diastolic function. Doppler parameters are   consistent with indeterminate ventricular filling pressure. - Aortic valve: There was mild regurgitation. - Mitral valve: Transvalvular velocity was within the normal range.   There was no evidence for stenosis. There was trivial   regurgitation. - Left atrium: The atrium was mildly dilated. - Right ventricle: The cavity size was normal. Wall thickness was   normal. Systolic function was normal. - Tricuspid valve: There was mild regurgitation. - Pulmonary arteries: Systolic pressure was within the normal    range.   ASSESSMENT:    1. Permanent atrial fibrillation ([place])   2. Pacemaker   3. Essential hypertension   4. Chronic combined systolic and diastolic heart failure ([place])   5. Chronic anticoagulation   6. Dementia without behavioral disturbance, unspecified dementia type      PLAN:  In order of problems listed above:  1. Stable without tachycardia.  Most recent EKG from January 2019 demonstrates atrial fibrillation with ventricular pacing.  No change compared to prior. 2. Normal function when last evaluated 3. Excellent control with target less than 130/80 mmHg 4. There is no evidence of volume overload. 5. No bleeding on Coumadin therapy. 6. Dementia is on display today in terms of conversation and  Generally stable from cardiac standpoint.  Clinical follow-up in 1 year.  The family voiced no concern.  Medication Adjustments/Labs and Tests Ordered: Current medicines are reviewed at length with the patient today.  Concerns regarding medicines are outlined above.  Medication changes, Labs and Tests ordered today are listed in the Patient Instructions below. Patient Instructions  Medication Instructions:  Your physician recommends that you continue on your current medications as directed. Please refer to the Current Medication list given to you today.   Labwork: BMET and CBC today  Testing/Procedures: None   Follow-Up: Your physician wants you to follow-up in 1 year with Dr. Tamala Julian.You will receive a reminder letter in the mail two months in advance. If you don't receive a letter, please call our office to schedule the follow-up appointment.   Any Other Special Instructions Will Be Listed Below (If Applicable).     If you need a refill on your cardiac medications before your next appointment, please call your pharmacy.      Signed, Sinclair Grooms, MD  07/23/2017 12:00 PM    [facility] [address], [place], [place]  [address] Phone:  [phone]; Fax: [phone]

## 2017-07-23 ENCOUNTER — Encounter: Payer: Self-pay | Admitting: Interventional Cardiology

## 2017-07-23 ENCOUNTER — Ambulatory Visit: Payer: Medicare Other | Admitting: Interventional Cardiology

## 2017-07-23 VITALS — BP 114/70 | HR 89 | Ht 67.0 in | Wt 212.0 lb

## 2017-07-23 DIAGNOSIS — Z7901 Long term (current) use of anticoagulants: Secondary | ICD-10-CM

## 2017-07-23 DIAGNOSIS — I10 Essential (primary) hypertension: Secondary | ICD-10-CM | POA: Diagnosis not present

## 2017-07-23 DIAGNOSIS — I4821 Permanent atrial fibrillation: Secondary | ICD-10-CM

## 2017-07-23 DIAGNOSIS — I5042 Chronic combined systolic (congestive) and diastolic (congestive) heart failure: Secondary | ICD-10-CM

## 2017-07-23 DIAGNOSIS — I482 Chronic atrial fibrillation: Secondary | ICD-10-CM

## 2017-07-23 DIAGNOSIS — Z950 Presence of cardiac pacemaker: Secondary | ICD-10-CM | POA: Diagnosis not present

## 2017-07-23 DIAGNOSIS — F0390 Unspecified dementia without behavioral disturbance: Secondary | ICD-10-CM

## 2017-07-23 LAB — CBC
HEMATOCRIT: 42.5 % (ref 37.5–51.0)
HEMOGLOBIN: 14.1 g/dL (ref 13.0–17.7)
MCH: 31.4 pg (ref 26.6–33.0)
MCHC: 33.2 g/dL (ref 31.5–35.7)
MCV: 95 fL (ref 79–97)
Platelets: 185 10*3/uL (ref 150–379)
RBC: 4.49 x10E6/uL (ref 4.14–5.80)
RDW: 13.9 % (ref 12.3–15.4)
WBC: 14 10*3/uL — ABNORMAL HIGH (ref 3.4–10.8)

## 2017-07-23 LAB — BASIC METABOLIC PANEL
BUN/Creatinine Ratio: 15 (ref 10–24)
BUN: 17 mg/dL (ref 8–27)
CALCIUM: 10 mg/dL (ref 8.6–10.2)
CHLORIDE: 106 mmol/L (ref 96–106)
CO2: 22 mmol/L (ref 20–29)
CREATININE: 1.13 mg/dL (ref 0.76–1.27)
GFR, EST AFRICAN AMERICAN: 66 mL/min/{1.73_m2} (ref >59)
GFR, EST NON AFRICAN AMERICAN: 57 mL/min/{1.73_m2} — AB (ref >59)
Glucose: 160 mg/dL — ABNORMAL HIGH (ref 65–99)
Potassium: 5 mmol/L (ref 3.5–5.2)
Sodium: 145 mmol/L — ABNORMAL HIGH (ref 134–144)

## 2017-07-23 NOTE — Patient Instructions (Addendum)
Medication Instructions:  Your physician recommends that you continue on your current medications as directed. Please refer to the Current Medication list given to you today.   Labwork: BMET and CBC today  Testing/Procedures: None   Follow-Up: Your physician wants you to follow-up in 1 year with Dr. Tamala Julian.You will receive a reminder letter in the mail two months in advance. If you don't receive a letter, please call our office to schedule the follow-up appointment.   Any Other Special Instructions Will Be Listed Below (If Applicable).     If you need a refill on your cardiac medications before your next appointment, please call your pharmacy.

## 2017-07-24 ENCOUNTER — Telehealth: Payer: Self-pay | Admitting: *Deleted

## 2017-07-24 DIAGNOSIS — I5042 Chronic combined systolic (congestive) and diastolic (congestive) heart failure: Secondary | ICD-10-CM

## 2017-07-24 MED ORDER — POTASSIUM CHLORIDE ER 10 MEQ PO TBCR: 10.0000 meq | EXTENDED_RELEASE_TABLET | Freq: Every day | ORAL | 3 refills | Status: DC

## 2017-07-24 NOTE — Telephone Encounter (Signed)
-----   Message from Belva Crome, MD sent at 07/23/2017  5:40 PM EDT ----- Let the patient know the kidney function is improved but the potassium is upper normal.  Decrease the potassium supplement to 10 mEq daily and repeat basic metabolic panel in 7 to 10 days.  Hemoglobin is normal. A copy will be sent to Prince Solian, MD

## 2017-07-24 NOTE — Telephone Encounter (Signed)
Spoke with pt's son, DPR on file.  Advised him of results and recommendations per Dr. Tamala Julian.  Son verbalized understanding and was in agreement with plan.  Son states pt can come 5/29 for labs.

## 2017-08-06 ENCOUNTER — Other Ambulatory Visit: Payer: Medicare Other | Admitting: *Deleted

## 2017-08-06 DIAGNOSIS — I5042 Chronic combined systolic (congestive) and diastolic (congestive) heart failure: Secondary | ICD-10-CM

## 2017-08-07 LAB — BASIC METABOLIC PANEL
BUN/Creatinine Ratio: 12 (ref 10–24)
BUN: 14 mg/dL (ref 8–27)
CALCIUM: 9.2 mg/dL (ref 8.6–10.2)
CO2: 22 mmol/L (ref 20–29)
CREATININE: 1.16 mg/dL (ref 0.76–1.27)
Chloride: 108 mmol/L — ABNORMAL HIGH (ref 96–106)
GFR, EST AFRICAN AMERICAN: 64 mL/min/{1.73_m2} (ref >59)
GFR, EST NON AFRICAN AMERICAN: 56 mL/min/{1.73_m2} — AB (ref >59)
Glucose: 142 mg/dL — ABNORMAL HIGH (ref 65–99)
Potassium: 4.4 mmol/L (ref 3.5–5.2)
Sodium: 141 mmol/L (ref 134–144)

## 2017-08-18 ENCOUNTER — Ambulatory Visit (INDEPENDENT_AMBULATORY_CARE_PROVIDER_SITE_OTHER): Payer: Medicare Other | Admitting: *Deleted

## 2017-08-18 DIAGNOSIS — I442 Atrioventricular block, complete: Secondary | ICD-10-CM | POA: Diagnosis not present

## 2017-08-18 NOTE — Progress Notes (Signed)
Remote pacemaker transmission.   

## 2017-08-19 ENCOUNTER — Encounter: Payer: Self-pay | Admitting: Cardiology

## 2017-09-01 LAB — CUP PACEART REMOTE DEVICE CHECK
Battery Remaining Percentage: 95.5 %
Battery Voltage: 3.01 V
Date Time Interrogation Session: 20190610070213
Implantable Pulse Generator Implant Date: 20170907
Lead Channel Impedance Value: 580 Ohm
Lead Channel Pacing Threshold Amplitude: 1.375 V
Lead Channel Pacing Threshold Pulse Width: 0.5 ms
Lead Channel Setting Pacing Pulse Width: 0.5 ms
MDC IDC LEAD IMPLANT DT: 19990721
MDC IDC LEAD LOCATION: 753860
MDC IDC MSMT BATTERY REMAINING LONGEVITY: 128 mo
MDC IDC MSMT LEADCHNL RV SENSING INTR AMPL: 5.6 mV
MDC IDC PG SERIAL: 7880882
MDC IDC SET LEADCHNL RV PACING AMPLITUDE: 1.625
MDC IDC SET LEADCHNL RV SENSING SENSITIVITY: 2 mV
MDC IDC STAT BRADY RV PERCENT PACED: 85 %
Pulse Gen Model: 1272

## 2017-10-16 ENCOUNTER — Telehealth: Payer: Self-pay | Admitting: Interventional Cardiology

## 2017-10-16 NOTE — Telephone Encounter (Signed)
Informed Randall Odom that pt is taking 8mEq of K+.  Explained it was reduced middle of May. She appreciates the call back.

## 2017-10-16 NOTE — Telephone Encounter (Signed)
New Message         Dr. Dagmar Hait PCP is needing to know if the partient is on 10 or 20 mg of the  [place] [phone] spoke with Reita May

## 2017-11-17 ENCOUNTER — Ambulatory Visit (INDEPENDENT_AMBULATORY_CARE_PROVIDER_SITE_OTHER): Payer: Medicare Other | Admitting: *Deleted

## 2017-11-17 ENCOUNTER — Encounter: Payer: Self-pay | Admitting: Cardiology

## 2017-11-17 DIAGNOSIS — I442 Atrioventricular block, complete: Secondary | ICD-10-CM | POA: Diagnosis not present

## 2017-11-17 NOTE — Progress Notes (Signed)
Remote pacemaker transmission.   

## 2017-12-09 LAB — CUP PACEART REMOTE DEVICE CHECK
Date Time Interrogation Session: 20190909094135
Implantable Lead Implant Date: 19990721
Implantable Lead Location: 753860
Implantable Pulse Generator Implant Date: 20170907
Lead Channel Pacing Threshold Amplitude: 1.375 V
Lead Channel Pacing Threshold Pulse Width: 0.5 ms
Lead Channel Sensing Intrinsic Amplitude: 6.1 mV
Lead Channel Setting Pacing Pulse Width: 0.5 ms
MDC IDC MSMT BATTERY REMAINING LONGEVITY: 124 mo
MDC IDC MSMT BATTERY REMAINING PERCENTAGE: 95.5 %
MDC IDC MSMT BATTERY VOLTAGE: 3.01 V
MDC IDC MSMT LEADCHNL RV IMPEDANCE VALUE: 580 Ohm
MDC IDC PG SERIAL: 7880882
MDC IDC SET LEADCHNL RV PACING AMPLITUDE: 1.625
MDC IDC SET LEADCHNL RV SENSING SENSITIVITY: 2 mV
MDC IDC STAT BRADY RV PERCENT PACED: 84 %

## 2018-02-19 ENCOUNTER — Ambulatory Visit (INDEPENDENT_AMBULATORY_CARE_PROVIDER_SITE_OTHER): Payer: Medicare Other

## 2018-02-19 DIAGNOSIS — I442 Atrioventricular block, complete: Secondary | ICD-10-CM

## 2018-02-20 ENCOUNTER — Encounter: Payer: Self-pay | Admitting: Cardiology

## 2018-02-20 NOTE — Progress Notes (Signed)
Remote pacemaker transmission.   

## 2018-03-12 ENCOUNTER — Encounter: Payer: Self-pay | Admitting: Nurse Practitioner

## 2018-04-04 LAB — CUP PACEART REMOTE DEVICE CHECK
Battery Remaining Longevity: 127 mo
Battery Remaining Percentage: 95.5 %
Implantable Lead Implant Date: 19990721
Implantable Lead Location: 753860
Implantable Pulse Generator Implant Date: 20170907
Lead Channel Pacing Threshold Amplitude: 1.5 V
Lead Channel Sensing Intrinsic Amplitude: 12 mV
Lead Channel Setting Sensing Sensitivity: 2 mV
MDC IDC MSMT BATTERY VOLTAGE: 2.99 V
MDC IDC MSMT LEADCHNL RV IMPEDANCE VALUE: 580 Ohm
MDC IDC MSMT LEADCHNL RV PACING THRESHOLD PULSEWIDTH: 0.5 ms
MDC IDC PG SERIAL: 7880882
MDC IDC SESS DTM: 20191213042505
MDC IDC SET LEADCHNL RV PACING AMPLITUDE: 1.75 V
MDC IDC SET LEADCHNL RV PACING PULSEWIDTH: 0.5 ms
MDC IDC STAT BRADY RV PERCENT PACED: 85 %

## 2018-04-07 NOTE — Progress Notes (Signed)
Electrophysiology Office Note Date: 04/08/2018  ID:  Randall Odom, DOB [birth year], MRN [id number]  PCP: Prince Solian, MD Primary Cardiologist: Tamala Julian Electrophysiologist: Allred  CC: Pacemaker follow-up  Randall Odom is a 85 y.o. male seen today for Dr Rayann Heman.  He presents today for routine electrophysiology followup.  Since last being seen in our clinic, the patient reports doing reasonably well.  He denies chest pain, palpitations, dyspnea, PND, orthopnea, nausea, vomiting, dizziness, syncope, edema, weight gain, or early satiety.  Device History: STJ single chamber PPM implanted 1989 for CHB; upgrade to STJ dual chamber PPM 1999; gen change 2006; gen change 2017   Past Medical History:  Diagnosis Date  . Allergic rhinitis   . Anxiety   . Atrial fibrillation with controlled ventricular response ([place])   . CAD (coronary artery disease)   . Carpal tunnel syndrome   . Chronic combined systolic and diastolic CHF (congestive heart failure) ([place])   . Complete heart block ([place])   . Depressive disorder, not elsewhere classified   . Diabetes mellitus   . Diverticulosis of colon (without mention of hemorrhage)   . DJD (degenerative joint disease)   . Enlarged prostate   . Erectile dysfunction   . Glaucoma    History of glaucoma  . History of BPH   . Hypertension   . Pure hypercholesterolemia   . Recurrent falls   . Sacroiliitis, not elsewhere classified ([place])   . Thoracic or lumbosacral neuritis or radiculitis, unspecified    Past Surgical History:  Procedure Laterality Date  . APPENDECTOMY  1966  . EP IMPLANTABLE DEVICE N/A 11/16/2015   PPM generator change by Dr Rayann Heman,  SJM Assurity SR PPM for complete heart block  . PACEMAKER INSERTION  09/28/1997   DDD for high-grade AV Block. Had an upgrade to a dual chamber device on 09/09/97. Replaced dual chamber battery on 12/06/04. New device is a Financial risk analyst Identity ADX XL DR, model [id number], seriel [id number].  . TONSILLECTOMY  1953      Current Outpatient Medications  Medication Sig Dispense Refill  . Cholecalciferol (VITAMIN D) 2000 UNITS tablet Take 2,000 Units by mouth daily.    Marland Kitchen ELIQUIS 5 MG TABS tablet TAKE 1 TABLET BY MOUTH TWICE A DAY 60 tablet 1  . fluticasone (FLONASE) 50 MCG/ACT nasal spray Place 2 sprays into the nose daily as needed for allergies.    . furosemide (LASIX) 40 MG tablet Take 1 tablet (40 mg total) by mouth daily. 30 tablet 0  . glipiZIDE (GLUCOTROL) 5 MG tablet Take 5 mg by mouth daily before breakfast.    . guaiFENesin (MUCINEX) 600 MG 12 hr tablet Take 1 tablet (600 mg total) by mouth 2 (two) times daily as needed for cough or to loosen phlegm. 20 tablet 0  . lisinopril (PRINIVIL,ZESTRIL) 5 MG tablet Take 5 mg by mouth daily.    . Multiple Vitamin (MULTIVITAMIN WITH MINERALS) TABS Take 1 tablet by mouth daily.    . potassium chloride (K-DUR) 10 MEQ tablet Take 1 tablet (10 mEq total) by mouth daily. 90 tablet 3  . simvastatin (ZOCOR) 40 MG tablet Take 1 tablet (40 mg total) by mouth at bedtime. 30 tablet 1  . terazosin (HYTRIN) 5 MG capsule Take 1 capsule (5 mg total) by mouth at bedtime. 30 capsule 1  . TRAVATAN Z 0.004 % SOLN ophthalmic solution Place 1 drop into both eyes at bedtime.  4   No current facility-administered medications for this visit.  Allergies:   Spironolactone   Social History: Social History   Socioeconomic History  . Marital status: Married    Spouse name: Not on file  . Number of children: Not on file  . Years of education: Not on file  . Highest education level: Not on file  Occupational History  . Occupation: Retired  Scientific laboratory technician  . Financial resource strain: Not on file  . Food insecurity:    Worry: Not on file    Inability: Not on file  . Transportation needs:    Medical: Not on file    Non-medical: Not on file  Tobacco Use  . Smoking status: Former Smoker    Packs/day: 1.50    Years: 25.00    Pack years: 37.50    Types: Cigarettes    Last  attempt to quit: 03/11/1968    Years since quitting: 50.1  . Smokeless tobacco: Never Used  Substance and Sexual Activity  . Alcohol use: No  . Drug use: No  . Sexual activity: Not on file  Lifestyle  . Physical activity:    Days per week: Not on file    Minutes per session: Not on file  . Stress: Not on file  Relationships  . Social connections:    Talks on phone: Not on file    Gets together: Not on file    Attends religious service: Not on file    Active member of club or organization: Not on file    Attends meetings of clubs or organizations: Not on file    Relationship status: Not on file  . Intimate partner violence:    Fear of current or ex partner: Not on file    Emotionally abused: Not on file    Physically abused: Not on file    Forced sexual activity: Not on file  Other Topics Concern  . Not on file  Social History Narrative  . Not on file    Family History: Family History  Problem Relation Age of Onset  . Cancer Mother        breast  . CVA Father   . Anemia Brother   . Heart disease Brother      Review of Systems: All other systems reviewed and are otherwise negative except as noted above.   Physical Exam: VS:  BP 100/72   Pulse 75   Ht 5\' 7"  (1.702 m)   Wt 216 lb 9.6 oz (98.2 kg)   SpO2 94%   BMI 33.92 kg/m  , BMI Body mass index is 33.92 kg/m.  GEN- The patient is elderly and obese appearing, alert and oriented x 3 today.   HEENT: normocephalic, atraumatic; sclera clear, conjunctiva pink; hearing intact; oropharynx clear; neck supple  Lungs- Clear to ausculation bilaterally, normal work of breathing.  No wheezes, rales, rhonchi Heart- Irregular rate and rhythm  GI- soft, non-tender, non-distended, bowel sounds present  Extremities- no clubbing, cyanosis, or edema  MS- no significant deformity or atrophy Skin- warm and dry, no rash or lesion; PPM pocket well healed Psych- euthymic mood, full affect Neuro- strength and sensation are  intact  PPM Interrogation- reviewed in detail today,  See PACEART report  EKG:  EKG is not ordered today.  Recent Labs: 07/23/2017: Hemoglobin 14.1; Platelets 185 08/06/2017: BUN 14; Creatinine, Ser 1.16; Potassium 4.4; Sodium 141   Wt Readings from Last 3 Encounters:  04/08/18 216 lb 9.6 oz (98.2 kg)  07/23/17 212 lb (96.2 kg)  03/19/17 196 lb (88.9 kg)  Other studies Reviewed: Additional studies/ records that were reviewed today include Dr Thompson Caul office notes  Assessment and Plan:  1. Complete heart block Normal PPM function See Pace Art report No changes today  2.  Permanent atrial fibrillation Continue Eliquis for CHADS2VASC of  V rates controlled  3.   Chronic systolic heart failure Stable No change required today    Current medicines are reviewed at length with the patient today.   The patient does not have concerns regarding his medicines.  The following changes were made today:  none  Labs/ tests ordered today include:   Orders Placed This Encounter  Procedures  . CUP PACEART INCLINIC DEVICE CHECK     Disposition:   Follow up with Merlin, me in 1 year, Dr Tamala Julian as scheduled    Signed, Chanetta Marshall, NP 04/08/2018 11:42 AM  [address] [place] [phone] (office) [phone] (fax)

## 2018-04-08 ENCOUNTER — Ambulatory Visit: Payer: Medicare Other | Admitting: Nurse Practitioner

## 2018-04-08 ENCOUNTER — Encounter: Payer: Self-pay | Admitting: Nurse Practitioner

## 2018-04-08 VITALS — BP 100/72 | HR 75 | Ht 67.0 in | Wt 216.6 lb

## 2018-04-08 DIAGNOSIS — I442 Atrioventricular block, complete: Secondary | ICD-10-CM | POA: Diagnosis not present

## 2018-04-08 DIAGNOSIS — I5022 Chronic systolic (congestive) heart failure: Secondary | ICD-10-CM | POA: Diagnosis not present

## 2018-04-08 DIAGNOSIS — I4821 Permanent atrial fibrillation: Secondary | ICD-10-CM

## 2018-04-08 LAB — CUP PACEART INCLINIC DEVICE CHECK
Date Time Interrogation Session: 20200129104404
Implantable Lead Implant Date: 19990721
Implantable Pulse Generator Implant Date: 20170907
MDC IDC LEAD LOCATION: 753860
Pulse Gen Model: 1272
Pulse Gen Serial Number: 7880882

## 2018-04-08 NOTE — Patient Instructions (Signed)
Medication Instructions:  none If you need a refill on your cardiac medications before your next appointment, please call your pharmacy.   Lab work: none If you have labs (blood work) drawn today and your tests are completely normal, you will receive your results only by: Marland Kitchen MyChart Message (if you have MyChart) OR . A paper copy in the mail If you have any lab test that is abnormal or we need to change your treatment, we will call you to review the results.  Testing/Procedures: none  Follow-Up: 1 year with [place], you and your health needs are our priority.  As part of our continuing mission to provide you with exceptional heart care, we have created designated Provider Care Teams.  These Care Teams include your primary Cardiologist (physician) and Advanced Practice Providers (APPs -  Physician Assistants and Nurse Practitioners) who all work together to provide you with the care you need, when you need it. .   Any Other Special Instructions Will Be Listed Below (If Applicable). Remote monitoring is used to monitor your Pacemaker  from home. This monitoring reduces the number of office visits required to check your device to one time per year. It allows [place] to keep an eye on the functioning of your device to ensure it is working properly. You are scheduled for a device check from home on 05/21/18. You may send your transmission at any time that day. If you have a wireless device, the transmission will be sent automatically. After your physician reviews your transmission, you will receive a postcard with your next transmission date.

## 2018-05-12 ENCOUNTER — Other Ambulatory Visit: Payer: Self-pay | Admitting: Interventional Cardiology

## 2018-05-12 NOTE — Telephone Encounter (Signed)
Eliquis 5mg  refill request received; pt is 85 yrs old, wt-98.2kg, Crea-1.16 on 08/06/2017, last seen by Chanetta Marshall on 04/08/2018; will send in refill to requested pharmacy.

## 2018-05-13 ENCOUNTER — Encounter (HOSPITAL_COMMUNITY): Payer: Self-pay | Admitting: Emergency Medicine

## 2018-05-13 ENCOUNTER — Emergency Department (HOSPITAL_COMMUNITY)
Admission: EM | Admit: 2018-05-13 | Discharge: 2018-05-13 | Disposition: A | Discharge status: Home / Self Care | Payer: Medicare Other | Attending: Emergency Medicine | Admitting: Emergency Medicine

## 2018-05-13 ENCOUNTER — Other Ambulatory Visit: Payer: Self-pay

## 2018-05-13 DIAGNOSIS — Z87891 Personal history of nicotine dependence: Secondary | ICD-10-CM | POA: Insufficient documentation

## 2018-05-13 DIAGNOSIS — I5042 Chronic combined systolic (congestive) and diastolic (congestive) heart failure: Secondary | ICD-10-CM | POA: Diagnosis not present

## 2018-05-13 DIAGNOSIS — E119 Type 2 diabetes mellitus without complications: Secondary | ICD-10-CM | POA: Insufficient documentation

## 2018-05-13 DIAGNOSIS — I2510 Atherosclerotic heart disease of native coronary artery without angina pectoris: Secondary | ICD-10-CM | POA: Insufficient documentation

## 2018-05-13 DIAGNOSIS — R197 Diarrhea, unspecified: Secondary | ICD-10-CM | POA: Diagnosis not present

## 2018-05-13 DIAGNOSIS — Z79899 Other long term (current) drug therapy: Secondary | ICD-10-CM | POA: Diagnosis not present

## 2018-05-13 DIAGNOSIS — I110 Hypertensive heart disease with heart failure: Secondary | ICD-10-CM | POA: Diagnosis not present

## 2018-05-13 DIAGNOSIS — I4891 Unspecified atrial fibrillation: Secondary | ICD-10-CM | POA: Diagnosis not present

## 2018-05-13 DIAGNOSIS — Z7901 Long term (current) use of anticoagulants: Secondary | ICD-10-CM | POA: Diagnosis not present

## 2018-05-13 DIAGNOSIS — Z7984 Long term (current) use of oral hypoglycemic drugs: Secondary | ICD-10-CM | POA: Diagnosis not present

## 2018-05-13 LAB — COMPREHENSIVE METABOLIC PANEL
ALT: 30 U/L (ref 0–44)
AST: 51 U/L — ABNORMAL HIGH (ref 15–41)
Albumin: 3.2 g/dL — ABNORMAL LOW (ref 3.5–5.0)
Alkaline Phosphatase: 85 U/L (ref 38–126)
Anion gap: 11 (ref 5–15)
BUN: 19 mg/dL (ref 8–23)
CALCIUM: 9.4 mg/dL (ref 8.9–10.3)
CHLORIDE: 106 mmol/L (ref 98–111)
CO2: 25 mmol/L (ref 22–32)
CREATININE: 1.35 mg/dL — AB (ref 0.61–1.24)
GFR calc Af Amer: 54 mL/min — ABNORMAL LOW (ref >60)
GFR calc non Af Amer: 46 mL/min — ABNORMAL LOW (ref >60)
Glucose, Bld: 228 mg/dL — ABNORMAL HIGH (ref 70–99)
Potassium: 4.3 mmol/L (ref 3.5–5.1)
Sodium: 142 mmol/L (ref 135–145)
Total Bilirubin: 0.7 mg/dL (ref 0.3–1.2)
Total Protein: 6.5 g/dL (ref 6.5–8.1)

## 2018-05-13 LAB — CBC
HEMATOCRIT: 44.6 % (ref 39.0–52.0)
Hemoglobin: 14.2 g/dL (ref 13.0–17.0)
MCH: 31.1 pg (ref 26.0–34.0)
MCHC: 31.8 g/dL (ref 30.0–36.0)
MCV: 97.6 fL (ref 80.0–100.0)
PLATELETS: 238 10*3/uL (ref 150–400)
RBC: 4.57 MIL/uL (ref 4.22–5.81)
RDW: 12.7 % (ref 11.5–15.5)
WBC: 12.8 10*3/uL — ABNORMAL HIGH (ref 4.0–10.5)
nRBC: 0 % (ref 0.0–0.2)

## 2018-05-13 LAB — C DIFFICILE QUICK SCREEN W PCR REFLEX
C Diff antigen: NEGATIVE
C Diff interpretation: NOT DETECTED
C Diff toxin: NEGATIVE

## 2018-05-13 LAB — ABO/RH: ABO/RH(D): O POS

## 2018-05-13 LAB — TYPE AND SCREEN
ABO/RH(D): O POS
Antibody Screen: NEGATIVE

## 2018-05-13 LAB — POC OCCULT BLOOD, ED: Fecal Occult Bld: NEGATIVE

## 2018-05-13 MED ORDER — SODIUM CHLORIDE 0.9 % IV BOLUS
500.0000 mL | Freq: Once | INTRAVENOUS | Status: AC
  Administered 2018-05-13: 500 mL via INTRAVENOUS

## 2018-05-13 NOTE — Discharge Instructions (Addendum)
I will contact you and let you know if the results of your C. difficile testing are positive and you require antibiotics.  Please make an appointment later this week for reevaluation.  Return to the emergency department for new or worsening symptoms.

## 2018-05-13 NOTE — ED Provider Notes (Addendum)
[facility] EMERGENCY DEPARTMENT Provider Note   CSN: [id number] Arrival date & time: 05/13/18  1014    History   Chief Complaint Chief Complaint  Patient presents with  . Diarrhea    HPI Randall Odom is a 85 y.o. male.     HPI   Patient is an 85 year old male with a history of A. fib, CAD, CHF, diverticulosis, who presents to the emergency department today for evaluation of diarrhea.  Patient started cefdinir about 1 week ago for URI symptoms.  He states that a few days after starting the medication he began to have diarrhea.  He has had about 10 episodes of diarrhea daily.  He became concerned because his stools now appear black and he is on Eliquis.  He reports feeling generally weak and has been somewhat short of breath since his recent URI symptoms.  He denies chest pain.  He denies nausea or vomiting.  He denies urinary symptoms.  He denies any significant abdominal pain.  He does note that he has some mild pain to the left side of his abdomen that has been present for the last several weeks after a fall.  He was evaluated by his doctor following this fall and he reports that he had a negative work-up.  Past Medical History:  Diagnosis Date  . Allergic rhinitis   . Anxiety   . Atrial fibrillation with controlled ventricular response ([place])   . CAD (coronary artery disease)   . Carpal tunnel syndrome   . Chronic combined systolic and diastolic CHF (congestive heart failure) ([place])   . Complete heart block ([place])   . Depressive disorder, not elsewhere classified   . Diabetes mellitus   . Diverticulosis of colon (without mention of hemorrhage)   . DJD (degenerative joint disease)   . Enlarged prostate   . Erectile dysfunction   . Glaucoma    History of glaucoma  . History of BPH   . Hypertension   . Pure hypercholesterolemia   . Recurrent falls   . Sacroiliitis, not elsewhere classified ([place])   . Thoracic or lumbosacral neuritis or radiculitis,  unspecified     Patient Active Problem List   Diagnosis Date Noted  . Chronic respiratory failure with hypoxia ([place]) 09/11/2016  . Dementia ([place]) 09/11/2016  . Acute renal failure (ARF) ([place]) 09/11/2016  . Sepsis ([place]) 09/11/2016  . Leukocytosis 09/11/2016  . Hematuria 09/11/2016  . Hyponatremia 09/11/2016  . Thrush 09/11/2016  . Community acquired pneumonia   . Pulmonary infiltrate 08/30/2016  . Acute respiratory failure with hypoxemia ([place]) 08/29/2016  . Abnormal CT of the chest 08/29/2016  . Acute and chronic respiratory failure with hypoxia ([place]) 08/29/2016  . Chronic anticoagulation 07/10/2016  . Abnormal CT scan, chest 08/13/2013  . Hyperlipidemia 08/09/2013  . Essential hypertension 08/09/2013  . Dyspnea on exertion 08/09/2013  . Chronic combined systolic and diastolic heart failure ([place]) 08/09/2013  . Permanent atrial fibrillation ([place]) 02/22/2013  . Complete heart block ([place]) 02/22/2013  . Occlusion and stenosis of carotid artery without mention of cerebral infarction 06/02/2012  . Physical deconditioning 05/18/2012  . C. difficile colitis 05/13/2012  . Dehydration 05/13/2012  . Diarrhea 05/12/2012  . Acute renal failure ([place]) 05/12/2012  . Hypokalemia 05/12/2012  . Syncope and collapse 05/06/2011  . Diabetes mellitus ([place]) 05/06/2011  . Pacemaker 05/06/2011  . CAD (coronary artery disease) 05/06/2011    Past Surgical History:  Procedure Laterality Date  . APPENDECTOMY  1966  . EP IMPLANTABLE  DEVICE N/A 11/16/2015   PPM generator change by Dr Rayann Heman,  SJM Assurity SR PPM for complete heart block  . PACEMAKER INSERTION  09/28/1997   DDD for high-grade AV Block. Had an upgrade to a dual chamber device on 09/09/97. Replaced dual chamber battery on 12/06/04. New device is a Financial risk analyst Identity ADX XL DR, model [id number], seriel [id number].  . TONSILLECTOMY  1953        Home Medications    Prior to Admission medications   Medication Sig Start Date End Date Taking?  Authorizing Provider  Cholecalciferol (VITAMIN D) 2000 UNITS tablet Take 2,000 Units by mouth daily.    [provider]  ELIQUIS 5 MG TABS tablet TAKE 1 TABLET BY MOUTH TWICE A DAY 05/12/18   Belva Crome, MD  fluticasone [facility]) 50 MCG/ACT nasal spray Place 2 sprays into the nose daily as needed for allergies. 05/27/12   Angiulli, Lavon Paganini, PA-C  furosemide (LASIX) 40 MG tablet Take 1 tablet (40 mg total) by mouth daily. 09/16/16   Aline August, MD  glipiZIDE (GLUCOTROL) 5 MG tablet Take 5 mg by mouth daily before breakfast.    [provider]  guaiFENesin (MUCINEX) 600 MG 12 hr tablet Take 1 tablet (600 mg total) by mouth 2 (two) times daily as needed for cough or to loosen phlegm. 09/16/16   Aline August, MD  lisinopril (PRINIVIL,ZESTRIL) 5 MG tablet Take 5 mg by mouth daily.    [provider]  Multiple Vitamin (MULTIVITAMIN WITH MINERALS) TABS Take 1 tablet by mouth daily. 05/27/12   Angiulli, Lavon Paganini, PA-C  potassium chloride (K-DUR) 10 MEQ tablet Take 1 tablet (10 mEq total) by mouth daily. 07/24/17 07/19/18  Belva Crome, MD  simvastatin (ZOCOR) 40 MG tablet Take 1 tablet (40 mg total) by mouth at bedtime. 05/27/12   Angiulli, Lavon Paganini, PA-C  terazosin (HYTRIN) 5 MG capsule Take 1 capsule (5 mg total) by mouth at bedtime. 05/27/12   Angiulli, Lavon Paganini, PA-C  TRAVATAN Z 0.004 % SOLN ophthalmic solution Place 1 drop into both eyes at bedtime.    [provider]    Family History Family History  Problem Relation Age of Onset  . Cancer Mother        breast  . CVA Father   . Anemia Brother   . Heart disease Brother     Social History Social History   Tobacco Use  . Smoking status: Former Smoker    Packs/day: 1.50    Years: 25.00    Pack years: 37.50    Types: Cigarettes    Last attempt to quit: 03/11/1968    Years since quitting: 50.2  . Smokeless tobacco: Never Used  Substance Use Topics  . Alcohol use: No  . Drug use: No     Allergies     Spironolactone   Review of Systems Review of Systems  Constitutional: Negative for fever.  HENT: Negative for ear pain and sore throat.   Eyes: Negative for visual disturbance.  Respiratory: Positive for cough and shortness of breath.   Cardiovascular: Negative for chest pain.  Gastrointestinal: Positive for abdominal pain (left side pain) and diarrhea. Negative for constipation, nausea and vomiting.  Genitourinary: Negative for dysuria and hematuria.  Musculoskeletal: Negative for back pain.  Skin: Negative for rash.  Neurological: Negative for headaches.  All other systems reviewed and are negative.  Physical Exam Updated Vital Signs BP 117/70   Pulse 74   Temp (!) 97.5 F (  36.4 C)   Resp 15   Ht 5\' 7"  (1.702 m)   Wt 98.2 kg   SpO2 97%   BMI 33.91 kg/m   Physical Exam Vitals signs and nursing note reviewed.  Constitutional:      General: He is not in acute distress.    Appearance: He is well-developed. He is not ill-appearing or toxic-appearing.  HENT:     Head: Normocephalic and atraumatic.     Mouth/Throat:     Mouth: Mucous membranes are dry.  Eyes:     Conjunctiva/sclera: Conjunctivae normal.  Neck:     Musculoskeletal: Neck supple.  Cardiovascular:     Rate and Rhythm: Normal rate and regular rhythm.     Pulses: Normal pulses.     Heart sounds: Normal heart sounds. No murmur.  Pulmonary:     Effort: Pulmonary effort is normal. No respiratory distress.     Breath sounds: Normal breath sounds. No stridor. No wheezing or rhonchi.     Comments: Dry cough on exam Abdominal:     General: Bowel sounds are normal. There is no distension.     Palpations: Abdomen is soft.     Tenderness: There is no abdominal tenderness. There is no guarding or rebound.  Genitourinary:    Comments: Chaperone present during rectal exam.  There is yellow stool noted.  No dark stool or melena noted.  No coffee-ground stool. Skin:    General: Skin is warm and dry.  Neurological:      Mental Status: He is alert.      ED Treatments / Results  Labs (all labs ordered are listed, but only abnormal results are displayed) Labs Reviewed  COMPREHENSIVE METABOLIC PANEL - Abnormal; Notable for the following components:      Result Value   Glucose, Bld 228 (*)    Creatinine, Ser 1.35 (*)    Albumin 3.2 (*)    AST 51 (*)    GFR calc non Af Amer 46 (*)    GFR calc Af Amer 54 (*)    All other components within normal limits  CBC - Abnormal; Notable for the following components:   WBC 12.8 (*)    All other components within normal limits  C DIFFICILE QUICK SCREEN W PCR REFLEX  POC OCCULT BLOOD, ED  TYPE AND SCREEN  ABO/RH    EKG None  Radiology No results found.  Procedures Procedures (including critical care time)  Medications Ordered in ED Medications  sodium chloride 0.9 % bolus 500 mL (0 mLs Intravenous Stopped 05/13/18 1246)     Initial Impression / Assessment and Plan / ED Course  I have reviewed the triage vital signs and the nursing notes.  Pertinent labs & imaging results that were available during my care of the patient were reviewed by me and considered in my medical decision making (see chart for details).     Final Clinical Impressions(s) / ED Diagnoses   Final diagnoses:  Diarrhea, unspecified type   Pt presenting with diarrhea after starting cefdinir. He became concerned when he started having dark stools. He is on eliquis. No abd pain. Vitals WNL.  Orthostatics are not significantly positive.   Exam is WNL and abdomen is soft and nontender. Rectal exam without gross blood or melena. Fecal occult testing is negative.   Patient's white blood count is slightly elevated at 12.8.  CMP with slightly elevated creatinine at 1.35.  This is somewhat elevated from prior however not a severe AKI.  Blood  sugar is elevated but no elevated anion gap or low CO2 to suggest DKA. Occult blood is negative making GI bleed very low on differential,  especially with normal hgb.  Suspect that his elevated WBC count may be due to his recent upper respiratory infection.  He is currently on day 6 out of 7 of cefdinir. I did recommend taking the last dose of this medication to tx his URI, and I feel that he is okay to take this given that he is not overly dehydrated, hypotensive, and generally appears well. Pt stayed in the ED for about 5 hours and during that time only had one bowel movement. This was sent to the lab for cdif testing, and I informed pt I would f/u with him if the results are positive. I have advised him to f/u closely with his pcp and to stay well hydrated at home. Advised him on specific return precautions for new or worsening sxs. HE and his family at bedside voice understanding of the plan and reasons to return. All questions answered, pt stable for d/c.  Case was discussed with Dr. Regenia Skeeter who personally evaluated the patient and is in agreement with the plan for discharge.   ED Discharge Orders    None       Rodney Booze, PA-C 05/14/18 0603    Rodney Booze, PA-C 05/14/18 6759    Sherwood Gambler, MD 05/25/18 [phone]

## 2018-05-13 NOTE — ED Triage Notes (Addendum)
Patients wife reports patient is on eloquis and has had black, coffee ground diarrhea since starting cefdinir about 1 week ago. She reports diarrhea has just gotten worse over the past week.

## 2018-05-21 ENCOUNTER — Ambulatory Visit (INDEPENDENT_AMBULATORY_CARE_PROVIDER_SITE_OTHER): Payer: Medicare Other | Admitting: *Deleted

## 2018-05-21 DIAGNOSIS — I442 Atrioventricular block, complete: Secondary | ICD-10-CM

## 2018-05-21 LAB — CUP PACEART REMOTE DEVICE CHECK
Battery Remaining Percentage: 95.5 %
Battery Voltage: 3.01 V
Brady Statistic RV Percent Paced: 89 %
Date Time Interrogation Session: 20200312070113
Implantable Lead Implant Date: 19990721
Implantable Pulse Generator Implant Date: 20170907
Lead Channel Impedance Value: 600 Ohm
Lead Channel Pacing Threshold Amplitude: 1.25 V
Lead Channel Pacing Threshold Pulse Width: 0.5 ms
Lead Channel Sensing Intrinsic Amplitude: 11.8 mV
Lead Channel Setting Pacing Amplitude: 1.5 V
Lead Channel Setting Pacing Pulse Width: 0.5 ms
Lead Channel Setting Sensing Sensitivity: 2 mV
MDC IDC LEAD LOCATION: 753860
MDC IDC MSMT BATTERY REMAINING LONGEVITY: 126 mo
Pulse Gen Model: 1272
Pulse Gen Serial Number: 7880882

## 2018-05-28 ENCOUNTER — Encounter: Payer: Self-pay | Admitting: Cardiology

## 2018-05-28 NOTE — Progress Notes (Signed)
Remote pacemaker transmission.   

## 2018-07-27 ENCOUNTER — Telehealth: Payer: Self-pay | Admitting: Cardiology

## 2018-07-27 ENCOUNTER — Encounter: Payer: Self-pay | Admitting: *Deleted

## 2018-07-27 NOTE — Telephone Encounter (Signed)
Pt wife, [name], [place] on file, gave verbal consent for visit.  Will also send to mychart.

## 2018-07-27 NOTE — Telephone Encounter (Signed)
New message      Virtual phone visit scheduled with wife on 08-05-18.  Pt was not available to get consent but wife said she would make sure he has a recent bp ready for the nurse.  Wife said pt does not "hear the best" and she would be on the phone also during the phone visit.

## 2018-08-04 NOTE — Progress Notes (Signed)
Virtual Visit via Telephone Note   This visit type was conducted due to national recommendations for restrictions regarding the COVID-19 Pandemic (e.g. social distancing) in an effort to limit this patient's exposure and mitigate transmission in our community.  Due to his co-morbid illnesses, this patient is at least at moderate risk for complications without adequate follow up.  This format is felt to be most appropriate for this patient at this time.  The patient did not have access to video technology/had technical difficulties with video requiring transitioning to audio format only (telephone).  All issues noted in this document were discussed and addressed.  No physical exam could be performed with this format.  Please refer to the patient's chart for his  consent to telehealth for [facility].   Date:  08/05/2018   ID:  Randall Odom, DOB [birth year], MRN [id number]  Patient Location: Home Provider Location: Office  PCP:  Prince Solian, MD  Cardiologist:  Sinclair Grooms, MD  Electrophysiologist:  Thompson Grayer, MD   Evaluation Performed:  Follow-Up Visit  Chief Complaint:  CHF and atrial fib.    History of Present Illness:    Randall Odom is a 85 y.o. male with Chronic combined systolic and diastolic heart failure, chronic atrial fibrillation, and known coronary calcification. He is on chronic anticoagulation without complications.  Cognitive impairment noted on last OV. On Eliquis    Here for yearly routine cardiology follow-up.  He has permanent pacemaker for complete heart block implanted years ago.  Pacer check 05/21/18 with normal device function last gen change 2017. Original implant 1989 STJ.  Labs 12.27/19 T chol 160, TG 145, HDL 52, LDL 79, TSH 3.07 and Hgb A1C 6.6.    Talked to both pt and his wife by phone, he is very hard of hearing.  But no chest pain or SOB.  He has no bleeding with eliquis.  He has been doing well.  No lower ext edema.  His wife believes his  memory is stable.   They both stay at home to prevent exposure to COVID 19.     The patient does not have symptoms concerning for COVID-19 infection (fever, chills, cough, or new shortness of breath).    Past Medical History:  Diagnosis Date  . Allergic rhinitis   . Anxiety   . Atrial fibrillation with controlled ventricular response ([place])   . CAD (coronary artery disease)   . Carpal tunnel syndrome   . Chronic combined systolic and diastolic CHF (congestive heart failure) ([place])   . Complete heart block ([place])   . Depressive disorder, not elsewhere classified   . Diabetes mellitus   . Diverticulosis of colon (without mention of hemorrhage)   . DJD (degenerative joint disease)   . Enlarged prostate   . Erectile dysfunction   . Glaucoma    History of glaucoma  . History of BPH   . Hypertension   . Pure hypercholesterolemia   . Recurrent falls   . Sacroiliitis, not elsewhere classified ([place])   . Thoracic or lumbosacral neuritis or radiculitis, unspecified    Past Surgical History:  Procedure Laterality Date  . APPENDECTOMY  1966  . EP IMPLANTABLE DEVICE N/A 11/16/2015   PPM generator change by Dr Rayann Heman,  SJM Assurity SR PPM for complete heart block  . PACEMAKER INSERTION  09/28/1997   DDD for high-grade AV Block. Had an upgrade to a dual chamber device on 09/09/97. Replaced dual chamber battery on 12/06/04. New device is  a Wilder Identity ADX XL DR, model [id number], seriel [id number].  . TONSILLECTOMY  1953     Current Meds  Medication Sig  . Cholecalciferol (VITAMIN D) 2000 UNITS tablet Take 2,000 Units by mouth daily.  Marland Kitchen ELIQUIS 5 MG TABS tablet TAKE 1 TABLET BY MOUTH TWICE A DAY  . fluticasone (FLONASE) 50 MCG/ACT nasal spray Place 2 sprays into the nose daily as needed for allergies.  . furosemide (LASIX) 40 MG tablet Take 1 tablet (40 mg total) by mouth daily.  Marland Kitchen glipiZIDE (GLUCOTROL) 5 MG tablet Take 5 mg by mouth daily before breakfast.  . guaiFENesin (MUCINEX) 600 MG 12 hr  tablet Take 1 tablet (600 mg total) by mouth 2 (two) times daily as needed for cough or to loosen phlegm.  Marland Kitchen lisinopril (PRINIVIL,ZESTRIL) 5 MG tablet Take 5 mg by mouth daily.  . Multiple Vitamin (MULTIVITAMIN WITH MINERALS) TABS Take 1 tablet by mouth daily.  . simvastatin (ZOCOR) 40 MG tablet Take 1 tablet (40 mg total) by mouth at bedtime.  Marland Kitchen terazosin (HYTRIN) 5 MG capsule Take 1 capsule (5 mg total) by mouth at bedtime.  . TRAVATAN Z 0.004 % SOLN ophthalmic solution Place 1 drop into both eyes at bedtime.     Allergies:   Spironolactone   Social History   Tobacco Use  . Smoking status: Former Smoker    Packs/day: 1.50    Years: 25.00    Pack years: 37.50    Types: Cigarettes    Last attempt to quit: 03/11/1968    Years since quitting: 50.4  . Smokeless tobacco: Never Used  Substance Use Topics  . Alcohol use: No  . Drug use: No     Family Hx: The patient's family history includes Anemia in his brother; CVA in his father; Cancer in his mother; Heart disease in his brother.  ROS:   Please see the history of present illness.    General:no colds or fevers, no weight changes Skin:no rashes or ulcers HEENT:no blurred vision, no congestion CV:see HPI PUL:see HPI GI:no diarrhea constipation or melena, no indigestion GU:no hematuria, no dysuria MS:no joint pain, no claudication Neuro:no syncope, no lightheadedness Endo:+ diabetes, no thyroid disease  All other systems reviewed and are negative.   Prior CV studies:   The following studies were reviewed today:  Last pacer check 05/21/18 and stable  ECHO 08/30/16 Study Conclusions  - Left ventricle: The cavity size was normal. There was moderate   concentric hypertrophy. Systolic function was mildly to   moderately reduced. The estimated ejection fraction was in the   range of 40% to 45%. Akinesis of the mid-apical anteroseptal,   inferoseptal, and apical myocardium. Hypokinesis of the anterior   myocardium. The study  is not technically sufficient to allow   evaluation of LV diastolic function. Doppler parameters are   consistent with indeterminate ventricular filling pressure. - Aortic valve: There was mild regurgitation. - Mitral valve: Transvalvular velocity was within the normal range.   There was no evidence for stenosis. There was trivial   regurgitation. - Left atrium: The atrium was mildly dilated. - Right ventricle: The cavity size was normal. Wall thickness was   normal. Systolic function was normal. - Tricuspid valve: There was mild regurgitation. - Pulmonary arteries: Systolic pressure was within the normal   range.   Labs/Other Tests and Data Reviewed:    EKG:  An ECG dated 03/19/17 was personally reviewed today and demonstrated:  a fib with V pacing. rate of  76  Recent Labs: 05/13/2018: ALT 30; BUN 19; Creatinine, Ser 1.35; Hemoglobin 14.2; Platelets 238; Potassium 4.3; Sodium 142   Recent Lipid Panel No results found for: CHOL, TRIG, HDL, CHOLHDL, LDLCALC, LDLDIRECT  Wt Readings from Last 3 Encounters:  08/05/18 216 lb (98 kg)  05/13/18 216 lb 7.9 oz (98.2 kg)  04/08/18 216 lb 9.6 oz (98.2 kg)     Objective:    Vital Signs:  BP 104/67   Ht 5\' 7"  (1.702 m)   Wt 216 lb (98 kg)   BMI 33.83 kg/m    VITAL SIGNS:  reviewed  General, healthy male, very hard of hearing, NAD Neuro: A&O X 3, answers questions approp when he hears them Lungs no SOB, can complete full sentences without SOB or wheezes. Psych pleasant affect   ASSESSMENT & PLAN:    1. Permanent a fib rate controlled - denies rapid HR.   2. anticoagulation on eliquis and no bleeding. 3. CHB with PPM normal funciton 4. Chronic systolic and diastolic HF, euvolemic today by both pt and wife. 5. Dementia but per wife has been stable.   COVID-19 Education: The signs and symptoms of COVID-19 were discussed with the patient and how to seek care for testing (follow up with PCP or arrange E-visit).  The importance of  social distancing was discussed today.  Time:   Today, I have spent 10 minutes with the patient with telehealth technology discussing the above problems.     Medication Adjustments/Labs and Tests Ordered: Current medicines are reviewed at length with the patient today.  Concerns regarding medicines are outlined above.   Tests Ordered: No orders of the defined types were placed in this encounter.   Medication Changes: No orders of the defined types were placed in this encounter.   Disposition:  Follow up in 6 month(s)  Signed, Cecilie Kicks, NP  08/05/2018 3:44 PM    [facility]

## 2018-08-05 ENCOUNTER — Telehealth (INDEPENDENT_AMBULATORY_CARE_PROVIDER_SITE_OTHER): Payer: Medicare Other | Admitting: Cardiology

## 2018-08-05 ENCOUNTER — Other Ambulatory Visit: Payer: Self-pay

## 2018-08-05 ENCOUNTER — Encounter: Payer: Self-pay | Admitting: Cardiology

## 2018-08-05 VITALS — BP 104/67 | Ht 67.0 in | Wt 216.0 lb

## 2018-08-05 DIAGNOSIS — Z7901 Long term (current) use of anticoagulants: Secondary | ICD-10-CM

## 2018-08-05 DIAGNOSIS — I5042 Chronic combined systolic (congestive) and diastolic (congestive) heart failure: Secondary | ICD-10-CM

## 2018-08-05 DIAGNOSIS — I4821 Permanent atrial fibrillation: Secondary | ICD-10-CM | POA: Diagnosis not present

## 2018-08-05 DIAGNOSIS — F0390 Unspecified dementia without behavioral disturbance: Secondary | ICD-10-CM

## 2018-08-05 DIAGNOSIS — I442 Atrioventricular block, complete: Secondary | ICD-10-CM

## 2018-08-05 NOTE — Patient Instructions (Signed)
Medication Instructions:  Your physician recommends that you continue on your current medications as directed. Please refer to the Current Medication list given to you today.  If you need a refill on your cardiac medications before your next appointment, please call your pharmacy.   Lab work: None ordered  If you have labs (blood work) drawn today and your tests are completely normal, you will receive your results only by: Marland Kitchen MyChart Message (if you have MyChart) OR . A paper copy in the mail If you have any lab test that is abnormal or we need to change your treatment, we will call you to review the results.2 Testing/Procedures: None ordered  Follow-Up: At [facility], you and your health needs are our priority.  As part of our continuing mission to provide you with exceptional heart care, we have created designated Provider Care Teams.  These Care Teams include your primary Cardiologist (physician) and Advanced Practice Providers (APPs -  Physician Assistants and Nurse Practitioners) who all work together to provide you with the care you need, when you need it. You will need a follow up appointment in 6 months.  Please call our office 2 months in advance to schedule this appointment.  You may see Sinclair Grooms, MD or one of the following Advanced Practice Providers on your designated Care Team:   Truitt Merle, NP Cecilie Kicks, NP . Kathyrn Drown, NP  Any Other Special Instructions Will Be Listed Below (If Applicable).

## 2018-08-10 ENCOUNTER — Other Ambulatory Visit: Payer: Self-pay

## 2018-08-10 MED ORDER — POTASSIUM CHLORIDE ER 10 MEQ PO TBCR: 10.0000 meq | EXTENDED_RELEASE_TABLET | Freq: Every day | ORAL | 3 refills | Status: DC

## 2018-08-20 ENCOUNTER — Ambulatory Visit (INDEPENDENT_AMBULATORY_CARE_PROVIDER_SITE_OTHER): Payer: Medicare Other | Admitting: *Deleted

## 2018-08-20 DIAGNOSIS — I442 Atrioventricular block, complete: Secondary | ICD-10-CM

## 2018-08-21 LAB — CUP PACEART REMOTE DEVICE CHECK
Battery Remaining Longevity: 130 mo
Battery Remaining Percentage: 95.5 %
Battery Voltage: 3.01 V
Brady Statistic RV Percent Paced: 89 %
Date Time Interrogation Session: 20200612035043
Implantable Lead Implant Date: 19990721
Implantable Lead Location: 753860
Implantable Pulse Generator Implant Date: 20170907
Lead Channel Impedance Value: 630 Ohm
Lead Channel Pacing Threshold Amplitude: 1.375 V
Lead Channel Pacing Threshold Pulse Width: 0.5 ms
Lead Channel Sensing Intrinsic Amplitude: 12 mV
Lead Channel Setting Pacing Amplitude: 1.625
Lead Channel Setting Pacing Pulse Width: 0.5 ms
Lead Channel Setting Sensing Sensitivity: 2 mV
Pulse Gen Model: 1272
Pulse Gen Serial Number: 7880882

## 2018-08-26 ENCOUNTER — Encounter: Payer: Self-pay | Admitting: Cardiology

## 2018-08-26 NOTE — Progress Notes (Signed)
Remote pacemaker transmission.   

## 2018-11-19 ENCOUNTER — Ambulatory Visit (INDEPENDENT_AMBULATORY_CARE_PROVIDER_SITE_OTHER): Payer: Medicare Other | Admitting: *Deleted

## 2018-11-19 DIAGNOSIS — I442 Atrioventricular block, complete: Secondary | ICD-10-CM

## 2018-11-19 DIAGNOSIS — I4821 Permanent atrial fibrillation: Secondary | ICD-10-CM | POA: Diagnosis not present

## 2018-11-19 LAB — CUP PACEART REMOTE DEVICE CHECK
Battery Remaining Longevity: 126 mo
Battery Remaining Percentage: 95.5 %
Battery Voltage: 2.99 V
Brady Statistic RV Percent Paced: 90 %
Date Time Interrogation Session: 20200910062721
Implantable Lead Implant Date: 19990721
Implantable Lead Location: 753860
Implantable Pulse Generator Implant Date: 20170907
Lead Channel Impedance Value: 590 Ohm
Lead Channel Pacing Threshold Amplitude: 1.75 V
Lead Channel Pacing Threshold Pulse Width: 0.5 ms
Lead Channel Sensing Intrinsic Amplitude: 11.9 mV
Lead Channel Setting Pacing Amplitude: 2 V
Lead Channel Setting Pacing Pulse Width: 0.5 ms
Lead Channel Setting Sensing Sensitivity: 2 mV
Pulse Gen Model: 1272
Pulse Gen Serial Number: 7880882

## 2018-11-30 NOTE — Progress Notes (Signed)
Remote pacemaker transmission.   

## 2019-01-19 ENCOUNTER — Other Ambulatory Visit: Payer: Self-pay | Admitting: Interventional Cardiology

## 2019-01-19 NOTE — Telephone Encounter (Signed)
26m 98.2kg Scr 1.35 05/13/18 Lovw/ingold 08/05/18

## 2019-02-18 ENCOUNTER — Ambulatory Visit (INDEPENDENT_AMBULATORY_CARE_PROVIDER_SITE_OTHER): Payer: Medicare Other | Admitting: *Deleted

## 2019-02-18 DIAGNOSIS — I442 Atrioventricular block, complete: Secondary | ICD-10-CM

## 2019-02-22 LAB — CUP PACEART REMOTE DEVICE CHECK
Battery Remaining Longevity: 127 mo
Battery Remaining Percentage: 95.5 %
Battery Voltage: 3.01 V
Brady Statistic RV Percent Paced: 90 %
Date Time Interrogation Session: 20201212020818
Implantable Lead Implant Date: 19990721
Implantable Lead Location: 753860
Implantable Pulse Generator Implant Date: 20170907
Lead Channel Impedance Value: 590 Ohm
Lead Channel Pacing Threshold Amplitude: 1.25 V
Lead Channel Pacing Threshold Pulse Width: 0.5 ms
Lead Channel Sensing Intrinsic Amplitude: 12 mV
Lead Channel Setting Pacing Amplitude: 1.5 V
Lead Channel Setting Pacing Pulse Width: 0.5 ms
Lead Channel Setting Sensing Sensitivity: 2 mV
Pulse Gen Model: 1272
Pulse Gen Serial Number: 7880882

## 2019-03-23 NOTE — Progress Notes (Signed)
Cardiology Office Note:    Date:  03/24/2019   ID:  Randall Odom, DOB 1926-08-31, MRN [id number]  PCP:  Prince Solian, MD  Cardiologist:  Sinclair Grooms, MD   Referring MD: Prince Solian, MD   Chief Complaint  Patient presents with  . Atrial Fibrillation  . Congestive Heart Failure    History of Present Illness:    Randall Odom is a 86 y.o. male with a hx of Chronic combined systolic and diastolic heart failure, chronic atrial fibrillation, chronic atrial fibrillation, coronary calcification, complete heart block, DM II, and dementia.  Randall Odom has no cardiac complaints.  Doing relatively well.  Has difficulty ambulating.  Had a very difficult time getting into the office today.  He has not had syncope.  His legs are weak.  Awakens frequently from sleep.  He just feels rundown all the time.  Past Medical History:  Diagnosis Date  . Allergic rhinitis   . Anxiety   . Atrial fibrillation with controlled ventricular response ([place])   . CAD (coronary artery disease)   . Carpal tunnel syndrome   . Chronic combined systolic and diastolic CHF (congestive heart failure) ([place])   . Complete heart block ([place])   . Depressive disorder, not elsewhere classified   . Diabetes mellitus   . Diverticulosis of colon (without mention of hemorrhage)   . DJD (degenerative joint disease)   . Enlarged prostate   . Erectile dysfunction   . Glaucoma    History of glaucoma  . History of BPH   . Hypertension   . Pure hypercholesterolemia   . Recurrent falls   . Sacroiliitis, not elsewhere classified ([place])   . Thoracic or lumbosacral neuritis or radiculitis, unspecified     Past Surgical History:  Procedure Laterality Date  . APPENDECTOMY  1966  . EP IMPLANTABLE DEVICE N/A 11/16/2015   PPM generator change by Dr Rayann Heman,  SJM Assurity SR PPM for complete heart block  . PACEMAKER INSERTION  09/28/1997   DDD for high-grade AV Block. Had an upgrade to a dual chamber device on 09/09/97.  Replaced dual chamber battery on 12/06/04. New device is a Financial risk analyst Identity ADX XL DR, model [id number], seriel [id number].  . TONSILLECTOMY  1953    Current Medications: Current Meds  Medication Sig  . Cholecalciferol (VITAMIN D) 2000 UNITS tablet Take 2,000 Units by mouth daily.  Marland Kitchen ELIQUIS 5 MG TABS tablet TAKE 1 TABLET BY MOUTH TWICE A DAY  . fluticasone (FLONASE) 50 MCG/ACT nasal spray Place 2 sprays into the nose daily as needed for allergies.  . furosemide (LASIX) 40 MG tablet Take 1 tablet (40 mg total) by mouth daily.  Marland Kitchen glipiZIDE (GLUCOTROL) 5 MG tablet Take 5 mg by mouth daily before breakfast.  . guaiFENesin (MUCINEX) 600 MG 12 hr tablet Take 1 tablet (600 mg total) by mouth 2 (two) times daily as needed for cough or to loosen phlegm.  Marland Kitchen lisinopril (PRINIVIL,ZESTRIL) 5 MG tablet Take 5 mg by mouth daily.  . Multiple Vitamin (MULTIVITAMIN WITH MINERALS) TABS Take 1 tablet by mouth daily.  . potassium chloride (K-DUR) 10 MEQ tablet Take 1 tablet (10 mEq total) by mouth daily.  . simvastatin (ZOCOR) 40 MG tablet Take 1 tablet (40 mg total) by mouth at bedtime.  Marland Kitchen terazosin (HYTRIN) 5 MG capsule Take 1 capsule (5 mg total) by mouth at bedtime.  . TRAVATAN Z 0.004 % SOLN ophthalmic solution Place 1 drop into both eyes at bedtime.  Allergies:   Spironolactone   Social History   Socioeconomic History  . Marital status: Married    Spouse name: Not on file  . Number of children: Not on file  . Years of education: Not on file  . Highest education level: Not on file  Occupational History  . Occupation: Retired  Tobacco Use  . Smoking status: Former Smoker    Packs/day: 1.50    Years: 25.00    Pack years: 37.50    Types: Cigarettes    Quit date: 03/11/1968    Years since quitting: 51.0  . Smokeless tobacco: Never Used  Substance and Sexual Activity  . Alcohol use: No  . Drug use: No  . Sexual activity: Not on file  Other Topics Concern  . Not on file  Social History Narrative   . Not on file   Social Determinants of Health   Financial Resource Strain:   . Difficulty of Paying Living Expenses: Not on file  Food Insecurity:   . Worried About Charity fundraiser in the Last Year: Not on file  . Ran Out of Food in the Last Year: Not on file  Transportation Needs:   . Lack of Transportation (Medical): Not on file  . Lack of Transportation (Non-Medical): Not on file  Physical Activity:   . Days of Exercise per Week: Not on file  . Minutes of Exercise per Session: Not on file  Stress:   . Feeling of Stress : Not on file  Social Connections:   . Frequency of Communication with Friends and Family: Not on file  . Frequency of Social Gatherings with Friends and Family: Not on file  . Attends Religious Services: Not on file  . Active Member of Clubs or Organizations: Not on file  . Attends Archivist Meetings: Not on file  . Marital Status: Not on file     Family History: The patient's family history includes Anemia in his brother; CVA in his father; Cancer in his mother; Heart disease in his brother.  ROS:   Please see the history of present illness.    Nocturia causes him to awaken frequently.  All other systems reviewed and are negative.  EKGs/Labs/Other Studies Reviewed:    The following studies were reviewed today: No new data  EKG:  EKG atrial fibrillation with ventricular pacing.  When compared to prior, no significant changes noted  Recent Labs: 05/13/2018: ALT 30; BUN 19; Creatinine, Ser 1.35; Hemoglobin 14.2; Platelets 238; Potassium 4.3; Sodium 142  Recent Lipid Panel No results found for: CHOL, TRIG, HDL, CHOLHDL, VLDL, LDLCALC, LDLDIRECT  Physical Exam:    VS:  BP 128/62   Pulse 73   Ht 5\' 7"  (1.702 m)   Wt 218 lb (98.9 kg)   SpO2 98%   BMI 34.14 kg/m     Wt Readings from Last 3 Encounters:  03/24/19 218 lb (98.9 kg)  08/05/18 216 lb (98 kg)  05/13/18 216 lb 7.9 oz (98.2 kg)     GEN: Obese and elderly.. No acute  distress HEENT: Normal NECK: No JVD. LYMPHATICS: No lymphadenopathy CARDIAC: Irregular, irregular RR without murmur, gallop, or edema. VASCULAR:  Normal Pulses. No bruits. RESPIRATORY:  Clear to auscultation without rales, wheezing or rhonchi  ABDOMEN: Soft, non-tender, non-distended, No pulsatile mass, MUSCULOSKELETAL: No deformity  SKIN: Warm and dry NEUROLOGIC:  Alert and oriented x 3 PSYCHIATRIC:  Normal affect   ASSESSMENT:    1. Permanent atrial fibrillation ([place])   2.  Complete heart block ([place])   3. Pacemaker   4. Chronic combined systolic and diastolic heart failure ([place])   5. Dementia without behavioral disturbance, unspecified dementia type ([place])   6. Educated about COVID-19 virus infection    PLAN:    In order of problems listed above:  1. Adequate rate control 2. Normally functioning pacemaker 3. Pacemaker in office interrogation will be done within the next 2 weeks.  He wonders if he can do this office visit virtually.  We will see if his device can be interrogated today and perhaps convert the visit with Chanetta Marshall to virtual 4. No volume overload on exam 5. He is calm without agitation. 6. 3W's and COVID-19 vaccine are advocated.   Medication Adjustments/Labs and Tests Ordered: Current medicines are reviewed at length with the patient today.  Concerns regarding medicines are outlined above.  No orders of the defined types were placed in this encounter.  No orders of the defined types were placed in this encounter.   There are no Patient Instructions on file for this visit.   Signed, Sinclair Grooms, MD  03/24/2019 2:32 PM    [facility]

## 2019-03-24 ENCOUNTER — Other Ambulatory Visit: Payer: Self-pay

## 2019-03-24 ENCOUNTER — Ambulatory Visit: Payer: Medicare Other | Admitting: Interventional Cardiology

## 2019-03-24 ENCOUNTER — Encounter: Payer: Self-pay | Admitting: Interventional Cardiology

## 2019-03-24 VITALS — BP 128/62 | HR 73 | Ht 67.0 in | Wt 218.0 lb

## 2019-03-24 DIAGNOSIS — I5042 Chronic combined systolic (congestive) and diastolic (congestive) heart failure: Secondary | ICD-10-CM

## 2019-03-24 DIAGNOSIS — F0390 Unspecified dementia without behavioral disturbance: Secondary | ICD-10-CM

## 2019-03-24 DIAGNOSIS — I442 Atrioventricular block, complete: Secondary | ICD-10-CM

## 2019-03-24 DIAGNOSIS — Z950 Presence of cardiac pacemaker: Secondary | ICD-10-CM | POA: Diagnosis not present

## 2019-03-24 DIAGNOSIS — I4821 Permanent atrial fibrillation: Secondary | ICD-10-CM | POA: Diagnosis not present

## 2019-03-24 DIAGNOSIS — Z7189 Other specified counseling: Secondary | ICD-10-CM

## 2019-03-24 NOTE — Patient Instructions (Signed)

## 2019-04-01 ENCOUNTER — Telehealth: Payer: Self-pay

## 2019-04-02 ENCOUNTER — Encounter: Payer: Medicare Other | Admitting: Nurse Practitioner

## 2019-04-05 ENCOUNTER — Encounter: Payer: Self-pay | Admitting: Internal Medicine

## 2019-04-05 ENCOUNTER — Telehealth (INDEPENDENT_AMBULATORY_CARE_PROVIDER_SITE_OTHER): Payer: Medicare Other | Admitting: Internal Medicine

## 2019-04-05 VITALS — BP 102/56 | HR 72 | Ht 67.0 in | Wt 212.0 lb

## 2019-04-05 DIAGNOSIS — I519 Heart disease, unspecified: Secondary | ICD-10-CM

## 2019-04-05 DIAGNOSIS — I10 Essential (primary) hypertension: Secondary | ICD-10-CM | POA: Diagnosis not present

## 2019-04-05 DIAGNOSIS — I442 Atrioventricular block, complete: Secondary | ICD-10-CM | POA: Diagnosis not present

## 2019-04-05 DIAGNOSIS — I4821 Permanent atrial fibrillation: Secondary | ICD-10-CM | POA: Diagnosis not present

## 2019-04-05 DIAGNOSIS — D6869 Other thrombophilia: Secondary | ICD-10-CM

## 2019-04-05 NOTE — Progress Notes (Signed)
Electrophysiology TeleHealth Note  Due to national recommendations of social distancing due to [place] 19, an audio telehealth visit is felt to be most appropriate for this patient at this time.  Verbal consent was obtained by me for the telehealth visit today.  The patient does not have capability for a virtual visit.  A phone visit is therefore required today.   Date:  04/05/2019   ID:  Randall Odom, DOB 23-Feb-1927, MRN [id number]  Location: patient's home  Provider location:  [place]  Evaluation Performed: Follow-up visit  PCP:  Prince Solian, MD   Electrophysiologist:  Dr Rayann Heman  Chief Complaint:  palpitations  History of Present Illness:    Randall Odom is a 86 y.o. male who presents via telehealth conferencing today.  Since last being seen in our clinic, the patient reports doing very well.  He has poor hearing.  his wife assisted with the visit today.  Today, he denies symptoms of palpitations, chest pain, shortness of breath,  lower extremity edema, dizziness, presyncope, or syncope.  The patient is otherwise without complaint today.    Past Medical History:  Diagnosis Date  . Allergic rhinitis   . Anxiety   . Atrial fibrillation with controlled ventricular response ([place])   . CAD (coronary artery disease)   . Carpal tunnel syndrome   . Chronic combined systolic and diastolic CHF (congestive heart failure) ([place])   . Complete heart block ([place])   . Depressive disorder, not elsewhere classified   . Diabetes mellitus   . Diverticulosis of colon (without mention of hemorrhage)   . DJD (degenerative joint disease)   . Enlarged prostate   . Erectile dysfunction   . Glaucoma    History of glaucoma  . History of BPH   . Hypertension   . Pure hypercholesterolemia   . Recurrent falls   . Sacroiliitis, not elsewhere classified ([place])   . Thoracic or lumbosacral neuritis or radiculitis, unspecified     Past Surgical History:  Procedure Laterality Date  .  APPENDECTOMY  1966  . EP IMPLANTABLE DEVICE N/A 11/16/2015   PPM generator change by Dr Rayann Heman,  SJM Assurity SR PPM for complete heart block  . PACEMAKER INSERTION  09/28/1997   DDD for high-grade AV Block. Had an upgrade to a dual chamber device on 09/09/97. Replaced dual chamber battery on 12/06/04. New device is a Financial risk analyst Identity ADX XL DR, model [id number], seriel [id number].  . TONSILLECTOMY  1953    Current Outpatient Medications  Medication Sig Dispense Refill  . Cholecalciferol (VITAMIN D) 2000 UNITS tablet Take 2,000 Units by mouth daily.    Marland Kitchen ELIQUIS 5 MG TABS tablet TAKE 1 TABLET BY MOUTH TWICE A DAY 180 tablet 1  . fluticasone (FLONASE) 50 MCG/ACT nasal spray Place 2 sprays into the nose daily as needed for allergies.    . furosemide (LASIX) 40 MG tablet Take 1 tablet (40 mg total) by mouth daily. 30 tablet 0  . glipiZIDE (GLUCOTROL) 5 MG tablet Take 5 mg by mouth daily before breakfast.    . guaiFENesin (MUCINEX) 600 MG 12 hr tablet Take 1 tablet (600 mg total) by mouth 2 (two) times daily as needed for cough or to loosen phlegm. 20 tablet 0  . lisinopril (PRINIVIL,ZESTRIL) 5 MG tablet Take 5 mg by mouth daily.    . Multiple Vitamin (MULTIVITAMIN WITH MINERALS) TABS Take 1 tablet by mouth daily.    . potassium chloride (K-DUR) 10 MEQ tablet Take  1 tablet (10 mEq total) by mouth daily. 90 tablet 3  . simvastatin (ZOCOR) 40 MG tablet Take 1 tablet (40 mg total) by mouth at bedtime. 30 tablet 1  . terazosin (HYTRIN) 5 MG capsule Take 1 capsule (5 mg total) by mouth at bedtime. 30 capsule 1  . TRAVATAN Z 0.004 % SOLN ophthalmic solution Place 1 drop into both eyes at bedtime.  4   No current facility-administered medications for this visit.    Allergies:   Spironolactone   Social History:  The patient  reports that he quit smoking about 51 years ago. His smoking use included cigarettes. He has a 37.50 pack-year smoking history. He has never used smokeless tobacco. He reports that he does  not drink alcohol or use drugs.   Family History:  The patient's  family history includes Anemia in his brother; CVA in his father; Cancer in his mother; Heart disease in his brother.   ROS:  Please see the history of present illness.   All other systems are personally reviewed and negative.    Exam:    Vital Signs:  BP (!) 102/56   Pulse 72   Ht 5\' 7"  (1.702 m)   Wt 212 lb (96.2 kg)   BMI 33.20 kg/m   He has poor hearing and wife had to assist with the visit  Labs/Other Tests and Data Reviewed:    Recent Labs: 05/13/2018: ALT 30; BUN 19; Creatinine, Ser 1.35; Hemoglobin 14.2; Platelets 238; Potassium 4.3; Sodium 142   Wt Readings from Last 3 Encounters:  04/05/19 212 lb (96.2 kg)  03/24/19 218 lb (98.9 kg)  08/05/18 216 lb (98 kg)     Last device remote is reviewed from [place] PDF which reveals normal device function   ASSESSMENT & PLAN:    1.  Complete heart block Remotes are uptodate Normal device function  2. Permanent afib Rate controlled On eliquis for chads2vasc score of 6  3. HTN Stable No change required today  4. CAD No ischemic symptoms  5. Chronic systolic dysfunction Stable No change required today   Follow-up:  with EP NP in a year   Patient Risk:  after full review of this patients clinical status, I feel that they are at moderate risk at this time.  Today, I have spent 15 minutes with the patient with telehealth technology discussing arrhythmia management .    Army Fossa, MD  04/05/2019 12:08 PM     [facility] [address] [place] [address] [phone] (office) [phone] (fax)

## 2019-05-20 ENCOUNTER — Ambulatory Visit (INDEPENDENT_AMBULATORY_CARE_PROVIDER_SITE_OTHER): Payer: Medicare Other | Admitting: *Deleted

## 2019-05-20 DIAGNOSIS — I442 Atrioventricular block, complete: Secondary | ICD-10-CM

## 2019-05-21 ENCOUNTER — Ambulatory Visit: Payer: Medicare Other

## 2019-05-22 LAB — CUP PACEART REMOTE DEVICE CHECK
Battery Remaining Longevity: 126 mo
Battery Remaining Percentage: 95.5 %
Battery Voltage: 2.99 V
Brady Statistic RV Percent Paced: 89 %
Date Time Interrogation Session: 20210312085222
Implantable Lead Implant Date: 19990721
Implantable Lead Location: 753860
Implantable Pulse Generator Implant Date: 20170907
Lead Channel Impedance Value: 560 Ohm
Lead Channel Pacing Threshold Amplitude: 1.625 V
Lead Channel Pacing Threshold Pulse Width: 0.5 ms
Lead Channel Sensing Intrinsic Amplitude: 12 mV
Lead Channel Setting Pacing Amplitude: 1.875
Lead Channel Setting Pacing Pulse Width: 0.5 ms
Lead Channel Setting Sensing Sensitivity: 2 mV
Pulse Gen Model: 1272
Pulse Gen Serial Number: 7880882

## 2019-07-10 ENCOUNTER — Other Ambulatory Visit: Payer: Self-pay | Admitting: Cardiology

## 2019-07-12 NOTE — Telephone Encounter (Signed)
Pt last saw Dr Rayann Heman 04/05/19 video visit Covid-19, last labs 03/15/19 Creat 1.3 at [facility] age 86, weight 96.2kg, based on specified criteria pt is on appropriate dosage of Eliquis 5mg  BID.  Will refill rx.

## 2019-07-22 ENCOUNTER — Other Ambulatory Visit: Payer: Self-pay | Admitting: Cardiology

## 2019-08-19 ENCOUNTER — Ambulatory Visit (INDEPENDENT_AMBULATORY_CARE_PROVIDER_SITE_OTHER): Payer: Medicare Other | Admitting: *Deleted

## 2019-08-19 DIAGNOSIS — I442 Atrioventricular block, complete: Secondary | ICD-10-CM | POA: Diagnosis not present

## 2019-08-19 LAB — CUP PACEART REMOTE DEVICE CHECK
Battery Remaining Longevity: 128 mo
Battery Remaining Percentage: 95.5 %
Battery Voltage: 2.99 V
Brady Statistic RV Percent Paced: 86 %
Date Time Interrogation Session: 20210610020014
Implantable Lead Implant Date: 19990721
Implantable Lead Location: 753860
Implantable Pulse Generator Implant Date: 20170907
Lead Channel Impedance Value: 600 Ohm
Lead Channel Pacing Threshold Amplitude: 1.25 V
Lead Channel Pacing Threshold Pulse Width: 0.5 ms
Lead Channel Sensing Intrinsic Amplitude: 12 mV
Lead Channel Setting Pacing Amplitude: 1.5 V
Lead Channel Setting Pacing Pulse Width: 0.5 ms
Lead Channel Setting Sensing Sensitivity: 2 mV
Pulse Gen Model: 1272
Pulse Gen Serial Number: 7880882

## 2019-08-20 NOTE — Progress Notes (Signed)
Remote pacemaker transmission.   

## 2019-11-18 ENCOUNTER — Ambulatory Visit (INDEPENDENT_AMBULATORY_CARE_PROVIDER_SITE_OTHER): Payer: Medicare Other | Admitting: *Deleted

## 2019-11-18 DIAGNOSIS — I442 Atrioventricular block, complete: Secondary | ICD-10-CM

## 2019-11-19 LAB — CUP PACEART REMOTE DEVICE CHECK
Battery Remaining Longevity: 126 mo
Battery Remaining Percentage: 95.5 %
Battery Voltage: 2.99 V
Brady Statistic RV Percent Paced: 86 %
Date Time Interrogation Session: 20210910051538
Implantable Lead Implant Date: 19990721
Implantable Lead Location: 753860
Implantable Pulse Generator Implant Date: 20170907
Lead Channel Impedance Value: 610 Ohm
Lead Channel Pacing Threshold Amplitude: 1.5 V
Lead Channel Pacing Threshold Pulse Width: 0.5 ms
Lead Channel Sensing Intrinsic Amplitude: 12 mV
Lead Channel Setting Pacing Amplitude: 1.75 V
Lead Channel Setting Pacing Pulse Width: 0.5 ms
Lead Channel Setting Sensing Sensitivity: 2 mV
Pulse Gen Model: 1272
Pulse Gen Serial Number: 7880882

## 2019-11-19 NOTE — Progress Notes (Signed)
Remote pacemaker transmission.   

## 2020-02-17 ENCOUNTER — Ambulatory Visit (INDEPENDENT_AMBULATORY_CARE_PROVIDER_SITE_OTHER): Payer: Medicare Other

## 2020-02-17 DIAGNOSIS — I442 Atrioventricular block, complete: Secondary | ICD-10-CM | POA: Diagnosis not present

## 2020-02-18 LAB — CUP PACEART REMOTE DEVICE CHECK
Battery Remaining Longevity: 125 mo
Battery Remaining Percentage: 95.5 %
Battery Voltage: 2.99 V
Brady Statistic RV Percent Paced: 86 %
Date Time Interrogation Session: 20211209025022
Implantable Lead Implant Date: 19990721
Implantable Lead Location: 753860
Implantable Pulse Generator Implant Date: 20170907
Lead Channel Impedance Value: 590 Ohm
Lead Channel Pacing Threshold Amplitude: 1.375 V
Lead Channel Pacing Threshold Pulse Width: 0.5 ms
Lead Channel Sensing Intrinsic Amplitude: 11.5 mV
Lead Channel Setting Pacing Amplitude: 1.625
Lead Channel Setting Pacing Pulse Width: 0.5 ms
Lead Channel Setting Sensing Sensitivity: 2 mV
Pulse Gen Model: 1272
Pulse Gen Serial Number: 7880882

## 2020-03-01 NOTE — Progress Notes (Signed)
Remote pacemaker transmission.   

## 2020-04-03 ENCOUNTER — Encounter: Payer: Medicare Other | Admitting: Physician Assistant

## 2020-04-12 ENCOUNTER — Ambulatory Visit: Payer: Medicare Other | Admitting: Interventional Cardiology

## 2020-05-18 ENCOUNTER — Ambulatory Visit (INDEPENDENT_AMBULATORY_CARE_PROVIDER_SITE_OTHER): Payer: Medicare Other

## 2020-05-18 DIAGNOSIS — I442 Atrioventricular block, complete: Secondary | ICD-10-CM | POA: Diagnosis not present

## 2020-05-21 LAB — CUP PACEART REMOTE DEVICE CHECK
Battery Remaining Longevity: 122 mo
Battery Remaining Percentage: 95.5 %
Battery Voltage: 2.99 V
Brady Statistic RV Percent Paced: 86 %
Date Time Interrogation Session: 20220310020013
Implantable Lead Implant Date: 19990721
Implantable Lead Location: 753860
Implantable Pulse Generator Implant Date: 20170907
Lead Channel Impedance Value: 590 Ohm
Lead Channel Pacing Threshold Amplitude: 2.125 V
Lead Channel Pacing Threshold Pulse Width: 0.5 ms
Lead Channel Sensing Intrinsic Amplitude: 12 mV
Lead Channel Setting Pacing Amplitude: 2.375
Lead Channel Setting Pacing Pulse Width: 0.5 ms
Lead Channel Setting Sensing Sensitivity: 2 mV
Pulse Gen Model: 1272
Pulse Gen Serial Number: 7880882

## 2020-05-26 NOTE — Progress Notes (Signed)
Remote pacemaker transmission.   

## 2020-05-27 ENCOUNTER — Other Ambulatory Visit: Payer: Self-pay | Admitting: Cardiology

## 2020-06-20 NOTE — Progress Notes (Deleted)
Cardiology Office Note:    Date:  06/20/2020   ID:  [name], DOB [birth year], MRN [id number]  PCP:  Prince Solian, MD  Cardiologist:  Sinclair Grooms, MD   Referring MD: Prince Solian, MD   No chief complaint on file.   History of Present Illness:    Randall Odom is a 87 y.o. male with a hx of Chronic combined systolic and diastolic heart failure, chronic atrial fibrillation, chronic atrial fibrillation, coronary calcification, complete heart block, DM II, and dementia.  ***  Past Medical History:  Diagnosis Date  . Allergic rhinitis   . Anxiety   . Atrial fibrillation with controlled ventricular response ([place])   . CAD (coronary artery disease)   . Carpal tunnel syndrome   . Chronic combined systolic and diastolic CHF (congestive heart failure) ([place])   . Complete heart block ([place])   . Depressive disorder, not elsewhere classified   . Diabetes mellitus   . Diverticulosis of colon (without mention of hemorrhage)   . DJD (degenerative joint disease)   . Enlarged prostate   . Erectile dysfunction   . Glaucoma    History of glaucoma  . History of BPH   . Hypertension   . Pure hypercholesterolemia   . Recurrent falls   . Sacroiliitis, not elsewhere classified ([place])   . Thoracic or lumbosacral neuritis or radiculitis, unspecified     Past Surgical History:  Procedure Laterality Date  . APPENDECTOMY  1966  . EP IMPLANTABLE DEVICE N/A 11/16/2015   PPM generator change by Dr Rayann Heman,  SJM Assurity SR PPM for complete heart block  . PACEMAKER INSERTION  09/28/1997   DDD for high-grade AV Block. Had an upgrade to a dual chamber device on 09/09/97. Replaced dual chamber battery on 12/06/04. New device is a Financial risk analyst Identity ADX XL DR, model [id number], seriel [id number].  . TONSILLECTOMY  1953    Current Medications: No outpatient medications have been marked as taking for the 06/21/20 encounter (Appointment) with Belva Crome, MD.     Allergies:   Spironolactone    Social History   Socioeconomic History  . Marital status: Married    Spouse name: Not on file  . Number of children: Not on file  . Years of education: Not on file  . Highest education level: Not on file  Occupational History  . Occupation: Retired  Tobacco Use  . Smoking status: Former Smoker    Packs/day: 1.50    Years: 25.00    Pack years: 37.50    Types: Cigarettes    Quit date: 03/11/1968    Years since quitting: 52.3  . Smokeless tobacco: Never Used  Substance and Sexual Activity  . Alcohol use: No  . Drug use: No  . Sexual activity: Not on file  Other Topics Concern  . Not on file  Social History Narrative  . Not on file   Social Determinants of Health   Financial Resource Strain: Not on file  Food Insecurity: Not on file  Transportation Needs: Not on file  Physical Activity: Not on file  Stress: Not on file  Social Connections: Not on file     Family History: The patient's family history includes Anemia in his brother; CVA in his father; Cancer in his mother; Heart disease in his brother.  ROS:   Please see the history of present illness.    *** All other systems reviewed and are negative.  EKGs/Labs/Other Studies Reviewed:    The  following studies were reviewed today: ***  EKG:  EKG ***  Recent Labs: No results found for requested labs within last 8760 hours.  Recent Lipid Panel No results found for: CHOL, TRIG, HDL, CHOLHDL, VLDL, LDLCALC, LDLDIRECT  Physical Exam:    VS:  There were no vitals taken for this visit.    Wt Readings from Last 3 Encounters:  04/05/19 212 lb (96.2 kg)  03/24/19 218 lb (98.9 kg)  08/05/18 216 lb (98 kg)     GEN: ***. No acute distress HEENT: Normal NECK: No JVD. LYMPHATICS: No lymphadenopathy CARDIAC: *** murmur. RRR *** gallop, or edema. VASCULAR: *** Normal Pulses. No bruits. RESPIRATORY:  Clear to auscultation without rales, wheezing or rhonchi  ABDOMEN: Soft, non-tender, non-distended, No pulsatile  mass, MUSCULOSKELETAL: No deformity  SKIN: Warm and dry NEUROLOGIC:  Alert and oriented x 3 PSYCHIATRIC:  Normal affect   ASSESSMENT:    1. Chronic combined systolic and diastolic heart failure ([place])   2. Essential hypertension   3. Permanent atrial fibrillation ([place])   4. Complete heart block ([place])   5. Acquired thrombophilia ([place])   6. Dementia without behavioral disturbance, unspecified dementia type ([place])   7. Pacemaker    PLAN:    In order of problems listed above:  1. ***   Medication Adjustments/Labs and Tests Ordered: Current medicines are reviewed at length with the patient today.  Concerns regarding medicines are outlined above.  No orders of the defined types were placed in this encounter.  No orders of the defined types were placed in this encounter.   There are no Patient Instructions on file for this visit.   Signed, Sinclair Grooms, MD  06/20/2020 9:38 PM    [place]

## 2020-06-21 ENCOUNTER — Ambulatory Visit: Payer: Medicare Other | Admitting: Infectious Diseases

## 2020-06-21 ENCOUNTER — Ambulatory Visit: Payer: Medicare Other | Admitting: Interventional Cardiology

## 2020-06-21 DIAGNOSIS — Z950 Presence of cardiac pacemaker: Secondary | ICD-10-CM

## 2020-06-21 DIAGNOSIS — I4821 Permanent atrial fibrillation: Secondary | ICD-10-CM

## 2020-06-21 DIAGNOSIS — I10 Essential (primary) hypertension: Secondary | ICD-10-CM

## 2020-06-21 DIAGNOSIS — F0390 Unspecified dementia without behavioral disturbance: Secondary | ICD-10-CM

## 2020-06-21 DIAGNOSIS — I5042 Chronic combined systolic (congestive) and diastolic (congestive) heart failure: Secondary | ICD-10-CM

## 2020-06-21 DIAGNOSIS — I442 Atrioventricular block, complete: Secondary | ICD-10-CM

## 2020-06-21 DIAGNOSIS — D6869 Other thrombophilia: Secondary | ICD-10-CM

## 2020-07-05 ENCOUNTER — Ambulatory Visit: Payer: Medicare Other | Admitting: Infectious Diseases

## 2020-08-14 ENCOUNTER — Telehealth: Payer: Self-pay

## 2020-08-14 ENCOUNTER — Other Ambulatory Visit (HOSPITAL_COMMUNITY): Payer: Self-pay

## 2020-08-14 ENCOUNTER — Encounter: Payer: Self-pay | Admitting: Infectious Disease

## 2020-08-14 ENCOUNTER — Other Ambulatory Visit: Payer: Self-pay

## 2020-08-14 ENCOUNTER — Ambulatory Visit: Payer: Medicare Other | Admitting: Infectious Disease

## 2020-08-14 VITALS — BP 100/65 | HR 67 | Temp 97.5°F

## 2020-08-14 DIAGNOSIS — A0471 Enterocolitis due to Clostridium difficile, recurrent: Secondary | ICD-10-CM | POA: Diagnosis not present

## 2020-08-14 DIAGNOSIS — F0390 Unspecified dementia without behavioral disturbance: Secondary | ICD-10-CM | POA: Diagnosis not present

## 2020-08-14 DIAGNOSIS — I442 Atrioventricular block, complete: Secondary | ICD-10-CM

## 2020-08-14 DIAGNOSIS — I2510 Atherosclerotic heart disease of native coronary artery without angina pectoris: Secondary | ICD-10-CM | POA: Diagnosis not present

## 2020-08-14 DIAGNOSIS — A0472 Enterocolitis due to Clostridium difficile, not specified as recurrent: Secondary | ICD-10-CM | POA: Diagnosis not present

## 2020-08-14 HISTORY — DX: Enterocolitis due to Clostridium difficile, recurrent: A04.71

## 2020-08-14 MED ORDER — DIFICID 200 MG PO TABS: ORAL_TABLET | ORAL | 1 refills | Status: AC

## 2020-08-14 NOTE — Progress Notes (Signed)
Subjective:  Reason for infectious disease consult: Recurrent C. difficile colitis  Requesting Physicians: Dr. Alessandra Bevels and Reginold Agent, NP   Patient ID: Randall Odom, male    DOB: [birth year], 87 y.o.   MRN: [id number]  HPI  [name] is a 87 year old Caucasian man with multiple medical problems including coronary artery disease, complete heart block with pacemaker, diabetes mellitus dementia, prior problems with constipation who has been suffering from diarrhea for several years and also in particular suffering from recurrent C. difficile colitis.  Apparently 1 point in time was so severe that he was hospitalized though I do not see that in his recent medical record and his son who accompanied him says that that episode where he was hospitalized was several years ago.  I do see a visit to the ER that occurred in May 13, 2018.  He is in particular been suffering from recurrent bouts of what seemed to be C. difficile colitis.  I will note that at some point in time he was actually on fluoroquinolone in the form of ciprofloxacin which really should not be given in the context of concerned about C. difficile colitis.  He did apparently receive Dificid in March with apparently no improvement also then a pulse and taper of vancomycin as well as a course with rifaximin.  He is also been given colestipol and cholestyramine along the way at times separately and at times in conjunction with antibiotics to treat C. difficile colitis.  He had an appointment to see [place] but did not make it as his diarrhea had improved this was after completing antibiotics and Colestipol with gastroenterology.  The last week to 10 days he has again been having copious loose bowel movements which are particularly bad at night keeping him up all night when he has to go to the bathroom and says he has  loose stools like "gravy."  He was instructed to start back up on Colestipol not started on antibiotics for C.  difficile that I can tell.  He does have a PPI on his medication list he has not been given other antibiotics to my knowledge recently.  Is also has a prescription for probiotic Florastor which I do not recommend.   He has not had Zimplava.   Past Medical History:  Diagnosis Date  . Allergic rhinitis   . Anxiety   . Atrial fibrillation with controlled ventricular response ([place])   . CAD (coronary artery disease)   . Carpal tunnel syndrome   . Chronic combined systolic and diastolic CHF (congestive heart failure) ([place])   . Complete heart block ([place])   . Depressive disorder, not elsewhere classified   . Diabetes mellitus   . Diverticulosis of colon (without mention of hemorrhage)   . DJD (degenerative joint disease)   . Enlarged prostate   . Erectile dysfunction   . Glaucoma    History of glaucoma  . History of BPH   . Hypertension   . Pure hypercholesterolemia   . Recurrent Clostridium difficile diarrhea 08/14/2020  . Recurrent falls   . Sacroiliitis, not elsewhere classified ([place])   . Thoracic or lumbosacral neuritis or radiculitis, unspecified     Past Surgical History:  Procedure Laterality Date  . APPENDECTOMY  1966  . EP IMPLANTABLE DEVICE N/A 11/16/2015   PPM generator change by Dr Rayann Heman,  SJM Assurity SR PPM for complete heart block  . PACEMAKER INSERTION  09/28/1997   DDD for high-grade AV Block. Had an upgrade to a dual chamber  device on 09/09/97. Replaced dual chamber battery on 12/06/04. New device is a Financial risk analyst Identity ADX XL DR, model [id number], seriel [id number].  . TONSILLECTOMY  1953    Family History  Problem Relation Age of Onset  . Cancer Mother        breast  . CVA Father   . Anemia Brother   . Heart disease Brother       Social History   Socioeconomic History  . Marital status: Married    Spouse name: Not on file  . Number of children: Not on file  . Years of education: Not on file  . Highest education level: Not on file  Occupational History  .  Occupation: Retired  Tobacco Use  . Smoking status: Former Smoker    Packs/day: 1.50    Years: 25.00    Pack years: 37.50    Types: Cigarettes    Quit date: 03/11/1968    Years since quitting: 52.4  . Smokeless tobacco: Never Used  Substance and Sexual Activity  . Alcohol use: No  . Drug use: No  . Sexual activity: Not on file  Other Topics Concern  . Not on file  Social History Narrative  . Not on file   Social Determinants of Health   Financial Resource Strain: Not on file  Food Insecurity: Not on file  Transportation Needs: Not on file  Physical Activity: Not on file  Stress: Not on file  Social Connections: Not on file    Allergies  Allergen Reactions  . Spironolactone Diarrhea and Nausea And Vomiting     Current Outpatient Medications:  .  Cholecalciferol (VITAMIN D) 2000 UNITS tablet, Take 2,000 Units by mouth daily., Disp: , Rfl:  .  ELIQUIS 5 MG TABS tablet, TAKE 1 TABLET BY MOUTH TWICE A DAY, Disp: 180 tablet, Rfl: 1 .  fluticasone (FLONASE) 50 MCG/ACT nasal spray, Place 2 sprays into the nose daily as needed for allergies., Disp: , Rfl:  .  furosemide (LASIX) 40 MG tablet, Take 1 tablet (40 mg total) by mouth daily., Disp: 30 tablet, Rfl: 0 .  glipiZIDE (GLUCOTROL) 5 MG tablet, Take 5 mg by mouth daily before breakfast., Disp: , Rfl:  .  guaiFENesin (MUCINEX) 600 MG 12 hr tablet, Take 1 tablet (600 mg total) by mouth 2 (two) times daily as needed for cough or to loosen phlegm., Disp: 20 tablet, Rfl: 0 .  lisinopril (PRINIVIL,ZESTRIL) 5 MG tablet, Take 5 mg by mouth daily., Disp: , Rfl:  .  Multiple Vitamin (MULTIVITAMIN WITH MINERALS) TABS, Take 1 tablet by mouth daily., Disp: , Rfl:  .  potassium chloride (KLOR-CON) 10 MEQ tablet, TAKE 1 TABLET BY MOUTH EVERY DAY, Disp: 90 tablet, Rfl: 3 .  simvastatin (ZOCOR) 40 MG tablet, Take 1 tablet (40 mg total) by mouth at bedtime., Disp: 30 tablet, Rfl: 1 .  terazosin (HYTRIN) 5 MG capsule, Take 1 capsule (5 mg total) by  mouth at bedtime., Disp: 30 capsule, Rfl: 1 .  TRAVATAN Z 0.004 % SOLN ophthalmic solution, Place 1 drop into both eyes at bedtime., Disp: , Rfl: 4   Review of Systems  Unable to perform ROS: Dementia  Constitutional: Negative for chills and fever.  HENT: Negative for congestion and sore throat.   Eyes: Negative for photophobia.  Respiratory: Negative for cough, shortness of breath and wheezing.   Cardiovascular: Negative for chest pain, palpitations and leg swelling.  Gastrointestinal: Negative for abdominal pain, blood in stool, constipation, diarrhea, nausea and  vomiting.  Genitourinary: Negative for dysuria, flank pain and hematuria.  Musculoskeletal: Negative for back pain and myalgias.  Skin: Negative for rash.  Neurological: Negative for dizziness, weakness and headaches.  Hematological: Does not bruise/bleed easily.  Psychiatric/Behavioral: Negative for suicidal ideas.       Objective:   Physical Exam Constitutional:      Appearance: He is well-developed.  HENT:     Head: Normocephalic and atraumatic.  Eyes:     Extraocular Movements: Extraocular movements intact.     Conjunctiva/sclera: Conjunctivae normal.  Cardiovascular:     Rate and Rhythm: Normal rate and regular rhythm.  Pulmonary:     Effort: Pulmonary effort is normal. No respiratory distress.     Breath sounds: No wheezing.  Abdominal:     General: There is no distension.     Palpations: Abdomen is soft. There is no mass.  Musculoskeletal:        General: No tenderness. Normal range of motion.     Cervical back: Normal range of motion and neck supple.  Skin:    General: Skin is warm and dry.     Coloration: Skin is not pale.     Findings: No erythema or rash.  Neurological:     General: No focal deficit present.     Mental Status: He is alert.  Psychiatric:        Attention and Perception: Attention normal.        Mood and Affect: Mood normal.        Speech: Speech is delayed.        Behavior:  Behavior is cooperative.        Thought Content: Thought content normal.        Cognition and Memory: Memory is impaired. He exhibits impaired recent memory and impaired remote memory.    He defers to his son for quite a bit of the questions that I ask him.       Assessment & Plan:   Apparent recurring C. difficile colitis:  I would like to simplify things by taking him off the colestipol, I do not like trying to give this drugo r cholestyramine when trying to dose vancomycin (which we will not be doing today but which we migh go back to)  I will Maxie Better on giving him a pulse and taper of Dificid with Dificid twice daily for 10 days followed by once daily for 20 days.  We will endeavor to get prior authorization for Zinplava so he can get an infusion of this monoclonal antibody  Like him to stop his PPI  Given its known risk for C Difficile and stop his probiotic as the latter puts him at risk of fungemia does not attenuate the risk of C. difficile colitis  I spent more than 80  minutes with the patient including greater than 50% of time in face to face counseling of the patient personally reviewing radiographs, long with pertinent laboratory microbiological data review of medical records and in coordination of her care.

## 2020-08-14 NOTE — Telephone Encounter (Signed)
[facility] Patient Advocate Encounter  Patient insurance will pay for Dificid copay is $5.00.  Zinplava will need a PA.  Ileene Patrick, [facility] Patient [facility] Phone: [phone] Fax:  [phone]

## 2020-08-14 NOTE — Patient Instructions (Signed)
I want to see if trying longer course of Dificid will help you  We will try to get PA for monoclonal antibody  DC the colestipol, cholestyramine  DC probiotic (florastor)  DC NExium and avoid proton pump inhibitors, antacids  Avoid unnecessary anbiotics

## 2020-08-15 LAB — CBC WITH DIFFERENTIAL/PLATELET
Absolute Monocytes: 2528 cells/uL — ABNORMAL HIGH (ref 200–950)
Basophils Absolute: 79 cells/uL (ref 0–200)
Basophils Relative: 0.5 %
Eosinophils Absolute: 126 cells/uL (ref 15–500)
Eosinophils Relative: 0.8 %
HCT: 42.2 % (ref 38.5–50.0)
Hemoglobin: 13.4 g/dL (ref 13.2–17.1)
Lymphs Abs: 3611 cells/uL (ref 850–3900)
MCH: 30.3 pg (ref 27.0–33.0)
MCHC: 31.8 g/dL — ABNORMAL LOW (ref 32.0–36.0)
MCV: 95.5 fL (ref 80.0–100.0)
MPV: 12.4 fL (ref 7.5–12.5)
Monocytes Relative: 16.1 %
Neutro Abs: 9357 cells/uL — ABNORMAL HIGH (ref 1500–7800)
Neutrophils Relative %: 59.6 %
Platelets: 180 10*3/uL (ref 140–400)
RBC: 4.42 10*6/uL (ref 4.20–5.80)
RDW: 11.2 % (ref 11.0–15.0)
Total Lymphocyte: 23 %
WBC: 15.7 10*3/uL — ABNORMAL HIGH (ref 3.8–10.8)

## 2020-08-15 LAB — BASIC METABOLIC PANEL WITH GFR
BUN/Creatinine Ratio: 10 (calc) (ref 6–22)
BUN: 13 mg/dL (ref 7–25)
CO2: 27 mmol/L (ref 20–32)
Calcium: 9.5 mg/dL (ref 8.6–10.3)
Chloride: 107 mmol/L (ref 98–110)
Creat: 1.32 mg/dL — ABNORMAL HIGH (ref 0.70–1.11)
GFR, Est African American: 54 mL/min/{1.73_m2} — ABNORMAL LOW (ref >=60)
GFR, Est Non African American: 47 mL/min/{1.73_m2} — ABNORMAL LOW (ref >=60)
Glucose, Bld: 146 mg/dL — ABNORMAL HIGH (ref 65–99)
Potassium: 4.5 mmol/L (ref 3.5–5.3)
Sodium: 144 mmol/L (ref 135–146)

## 2020-08-17 ENCOUNTER — Ambulatory Visit (INDEPENDENT_AMBULATORY_CARE_PROVIDER_SITE_OTHER): Payer: Medicare Other

## 2020-08-17 DIAGNOSIS — I442 Atrioventricular block, complete: Secondary | ICD-10-CM | POA: Diagnosis not present

## 2020-08-17 LAB — CUP PACEART REMOTE DEVICE CHECK
Battery Remaining Longevity: 124 mo
Battery Remaining Percentage: 95.5 %
Battery Voltage: 2.99 V
Brady Statistic RV Percent Paced: 87 %
Date Time Interrogation Session: 20220609020027
Implantable Lead Implant Date: 19990721
Implantable Lead Location: 753860
Implantable Pulse Generator Implant Date: 20170907
Lead Channel Impedance Value: 600 Ohm
Lead Channel Pacing Threshold Amplitude: 2.375 V
Lead Channel Pacing Threshold Pulse Width: 0.5 ms
Lead Channel Sensing Intrinsic Amplitude: 11.4 mV
Lead Channel Setting Pacing Amplitude: 2.625
Lead Channel Setting Pacing Pulse Width: 0.5 ms
Lead Channel Setting Sensing Sensitivity: 2 mV
Pulse Gen Model: 1272
Pulse Gen Serial Number: 7880882

## 2020-09-08 NOTE — Progress Notes (Signed)
Remote pacemaker transmission.   

## 2020-10-18 ENCOUNTER — Ambulatory Visit: Payer: Medicare Other | Admitting: Infectious Disease

## 2020-11-16 ENCOUNTER — Ambulatory Visit (INDEPENDENT_AMBULATORY_CARE_PROVIDER_SITE_OTHER): Payer: Medicare Other

## 2020-11-16 DIAGNOSIS — I442 Atrioventricular block, complete: Secondary | ICD-10-CM | POA: Diagnosis not present

## 2020-11-16 LAB — CUP PACEART REMOTE DEVICE CHECK
Battery Remaining Longevity: 69 mo
Battery Remaining Percentage: 57 %
Battery Voltage: 2.99 V
Brady Statistic RV Percent Paced: 87 %
Date Time Interrogation Session: 20220908035249
Implantable Lead Implant Date: 19990721
Implantable Lead Location: 753860
Implantable Pulse Generator Implant Date: 20170907
Lead Channel Impedance Value: 580 Ohm
Lead Channel Pacing Threshold Amplitude: 2.25 V
Lead Channel Pacing Threshold Pulse Width: 0.5 ms
Lead Channel Sensing Intrinsic Amplitude: 12 mV
Lead Channel Setting Pacing Amplitude: 2.5 V
Lead Channel Setting Pacing Pulse Width: 0.5 ms
Lead Channel Setting Sensing Sensitivity: 2 mV
Pulse Gen Model: 1272
Pulse Gen Serial Number: 7880882

## 2020-11-24 NOTE — Progress Notes (Signed)
Remote pacemaker transmission.   

## 2021-02-15 ENCOUNTER — Ambulatory Visit (INDEPENDENT_AMBULATORY_CARE_PROVIDER_SITE_OTHER): Payer: Medicare Other

## 2021-02-15 DIAGNOSIS — I442 Atrioventricular block, complete: Secondary | ICD-10-CM

## 2021-02-15 LAB — CUP PACEART REMOTE DEVICE CHECK
Battery Remaining Longevity: 68 mo
Battery Remaining Percentage: 55 %
Battery Voltage: 2.98 V
Brady Statistic RV Percent Paced: 88 %
Date Time Interrogation Session: 20221208020014
Implantable Lead Implant Date: 19990721
Implantable Lead Location: 753860
Implantable Pulse Generator Implant Date: 20170907
Lead Channel Impedance Value: 590 Ohm
Lead Channel Pacing Threshold Amplitude: 1.75 V
Lead Channel Pacing Threshold Pulse Width: 0.5 ms
Lead Channel Sensing Intrinsic Amplitude: 12 mV
Lead Channel Setting Pacing Amplitude: 2 V
Lead Channel Setting Pacing Pulse Width: 0.5 ms
Lead Channel Setting Sensing Sensitivity: 2 mV
Pulse Gen Model: 1272
Pulse Gen Serial Number: 7880882

## 2021-02-23 NOTE — Progress Notes (Signed)
Remote pacemaker transmission.   

## 2021-03-13 ENCOUNTER — Other Ambulatory Visit: Payer: Self-pay

## 2021-03-13 ENCOUNTER — Inpatient Hospital Stay (HOSPITAL_COMMUNITY)
Admission: EM | Admit: 2021-03-13 | Discharge: 2021-03-26 | DRG: 178 | Disposition: A | Discharge status: Skilled Nursing Facility | Payer: Medicare Other | Attending: Internal Medicine | Admitting: Internal Medicine

## 2021-03-13 ENCOUNTER — Emergency Department (HOSPITAL_COMMUNITY): Payer: Medicare Other

## 2021-03-13 DIAGNOSIS — E7800 Pure hypercholesterolemia, unspecified: Secondary | ICD-10-CM | POA: Diagnosis present

## 2021-03-13 DIAGNOSIS — R7989 Other specified abnormal findings of blood chemistry: Secondary | ICD-10-CM

## 2021-03-13 DIAGNOSIS — I5042 Chronic combined systolic (congestive) and diastolic (congestive) heart failure: Secondary | ICD-10-CM | POA: Diagnosis present

## 2021-03-13 DIAGNOSIS — I442 Atrioventricular block, complete: Secondary | ICD-10-CM | POA: Diagnosis present

## 2021-03-13 DIAGNOSIS — H409 Unspecified glaucoma: Secondary | ICD-10-CM | POA: Diagnosis present

## 2021-03-13 DIAGNOSIS — Z87891 Personal history of nicotine dependence: Secondary | ICD-10-CM

## 2021-03-13 DIAGNOSIS — R197 Diarrhea, unspecified: Secondary | ICD-10-CM | POA: Diagnosis present

## 2021-03-13 DIAGNOSIS — Z803 Family history of malignant neoplasm of breast: Secondary | ICD-10-CM

## 2021-03-13 DIAGNOSIS — J9611 Chronic respiratory failure with hypoxia: Secondary | ICD-10-CM | POA: Diagnosis present

## 2021-03-13 DIAGNOSIS — R296 Repeated falls: Secondary | ICD-10-CM

## 2021-03-13 DIAGNOSIS — U071 COVID-19: Secondary | ICD-10-CM | POA: Diagnosis not present

## 2021-03-13 DIAGNOSIS — M1990 Unspecified osteoarthritis, unspecified site: Secondary | ICD-10-CM | POA: Diagnosis present

## 2021-03-13 DIAGNOSIS — E119 Type 2 diabetes mellitus without complications: Secondary | ICD-10-CM

## 2021-03-13 DIAGNOSIS — N1831 Chronic kidney disease, stage 3a: Secondary | ICD-10-CM | POA: Diagnosis present

## 2021-03-13 DIAGNOSIS — N400 Enlarged prostate without lower urinary tract symptoms: Secondary | ICD-10-CM | POA: Diagnosis present

## 2021-03-13 DIAGNOSIS — Z823 Family history of stroke: Secondary | ICD-10-CM

## 2021-03-13 DIAGNOSIS — N179 Acute kidney failure, unspecified: Secondary | ICD-10-CM | POA: Diagnosis present

## 2021-03-13 DIAGNOSIS — F0390 Unspecified dementia without behavioral disturbance: Secondary | ICD-10-CM | POA: Diagnosis present

## 2021-03-13 DIAGNOSIS — Z8249 Family history of ischemic heart disease and other diseases of the circulatory system: Secondary | ICD-10-CM

## 2021-03-13 DIAGNOSIS — E8809 Other disorders of plasma-protein metabolism, not elsewhere classified: Secondary | ICD-10-CM | POA: Diagnosis present

## 2021-03-13 DIAGNOSIS — Z66 Do not resuscitate: Secondary | ICD-10-CM | POA: Diagnosis present

## 2021-03-13 DIAGNOSIS — A0839 Other viral enteritis: Secondary | ICD-10-CM | POA: Diagnosis present

## 2021-03-13 DIAGNOSIS — I130 Hypertensive heart and chronic kidney disease with heart failure and stage 1 through stage 4 chronic kidney disease, or unspecified chronic kidney disease: Secondary | ICD-10-CM | POA: Diagnosis present

## 2021-03-13 DIAGNOSIS — Z7901 Long term (current) use of anticoagulants: Secondary | ICD-10-CM

## 2021-03-13 DIAGNOSIS — W19XXXA Unspecified fall, initial encounter: Secondary | ICD-10-CM

## 2021-03-13 DIAGNOSIS — I10 Essential (primary) hypertension: Secondary | ICD-10-CM | POA: Diagnosis present

## 2021-03-13 DIAGNOSIS — Z888 Allergy status to other drugs, medicaments and biological substances status: Secondary | ICD-10-CM

## 2021-03-13 DIAGNOSIS — I2510 Atherosclerotic heart disease of native coronary artery without angina pectoris: Secondary | ICD-10-CM | POA: Diagnosis present

## 2021-03-13 DIAGNOSIS — R531 Weakness: Secondary | ICD-10-CM | POA: Diagnosis not present

## 2021-03-13 DIAGNOSIS — R5381 Other malaise: Secondary | ICD-10-CM | POA: Diagnosis present

## 2021-03-13 DIAGNOSIS — N183 Chronic kidney disease, stage 3 unspecified: Secondary | ICD-10-CM

## 2021-03-13 DIAGNOSIS — Z7984 Long term (current) use of oral hypoglycemic drugs: Secondary | ICD-10-CM

## 2021-03-13 DIAGNOSIS — E860 Dehydration: Secondary | ICD-10-CM | POA: Diagnosis present

## 2021-03-13 DIAGNOSIS — Z79899 Other long term (current) drug therapy: Secondary | ICD-10-CM

## 2021-03-13 DIAGNOSIS — I4821 Permanent atrial fibrillation: Secondary | ICD-10-CM | POA: Diagnosis present

## 2021-03-13 DIAGNOSIS — E1122 Type 2 diabetes mellitus with diabetic chronic kidney disease: Secondary | ICD-10-CM | POA: Diagnosis present

## 2021-03-13 DIAGNOSIS — Z751 Person awaiting admission to adequate facility elsewhere: Secondary | ICD-10-CM

## 2021-03-13 DIAGNOSIS — K5790 Diverticulosis of intestine, part unspecified, without perforation or abscess without bleeding: Secondary | ICD-10-CM | POA: Diagnosis present

## 2021-03-13 LAB — HEPATIC FUNCTION PANEL
ALT: 30 U/L (ref 0–44)
AST: 67 U/L — ABNORMAL HIGH (ref 15–41)
Albumin: 3.2 g/dL — ABNORMAL LOW (ref 3.5–5.0)
Alkaline Phosphatase: 73 U/L (ref 38–126)
Bilirubin, Direct: 0.1 mg/dL (ref 0.0–0.2)
Indirect Bilirubin: 0.5 mg/dL (ref 0.3–0.9)
Total Bilirubin: 0.6 mg/dL (ref 0.3–1.2)
Total Protein: 6.8 g/dL (ref 6.5–8.1)

## 2021-03-13 LAB — CBC
HCT: 40 % (ref 39.0–52.0)
Hemoglobin: 12.6 g/dL — ABNORMAL LOW (ref 13.0–17.0)
MCH: 31.1 pg (ref 26.0–34.0)
MCHC: 31.5 g/dL (ref 30.0–36.0)
MCV: 98.8 fL (ref 80.0–100.0)
Platelets: 148 10*3/uL — ABNORMAL LOW (ref 150–400)
RBC: 4.05 MIL/uL — ABNORMAL LOW (ref 4.22–5.81)
RDW: 13.7 % (ref 11.5–15.5)
WBC: 14.6 10*3/uL — ABNORMAL HIGH (ref 4.0–10.5)
nRBC: 0 % (ref 0.0–0.2)

## 2021-03-13 LAB — BASIC METABOLIC PANEL
Anion gap: 13 (ref 5–15)
BUN: 18 mg/dL (ref 8–23)
CO2: 21 mmol/L — ABNORMAL LOW (ref 22–32)
Calcium: 9.3 mg/dL (ref 8.9–10.3)
Chloride: 101 mmol/L (ref 98–111)
Creatinine, Ser: 1.75 mg/dL — ABNORMAL HIGH (ref 0.61–1.24)
GFR, Estimated: 36 mL/min — ABNORMAL LOW (ref >60)
Glucose, Bld: 126 mg/dL — ABNORMAL HIGH (ref 70–99)
Potassium: 4.3 mmol/L (ref 3.5–5.1)
Sodium: 135 mmol/L (ref 135–145)

## 2021-03-13 LAB — PROTIME-INR
INR: 1.6 — ABNORMAL HIGH (ref 0.8–1.2)
Prothrombin Time: 19.1 seconds — ABNORMAL HIGH (ref 11.4–15.2)

## 2021-03-13 LAB — LACTATE DEHYDROGENASE: LDH: 152 U/L (ref 98–192)

## 2021-03-13 LAB — RESP PANEL BY RT-PCR (FLU A&B, COVID) ARPGX2
Influenza A by PCR: NEGATIVE
Influenza B by PCR: NEGATIVE
SARS Coronavirus 2 by RT PCR: POSITIVE — AB

## 2021-03-13 LAB — LACTIC ACID, PLASMA: Lactic Acid, Venous: 1.8 mmol/L (ref 0.5–1.9)

## 2021-03-13 LAB — D-DIMER, QUANTITATIVE: D-Dimer, Quant: 5.39 ug{FEU}/mL — ABNORMAL HIGH (ref 0.00–0.50)

## 2021-03-13 LAB — TRIGLYCERIDES: Triglycerides: 82 mg/dL

## 2021-03-13 LAB — LIPASE, BLOOD: Lipase: 30 U/L (ref 11–51)

## 2021-03-13 LAB — FIBRINOGEN: Fibrinogen: 524 mg/dL — ABNORMAL HIGH (ref 210–475)

## 2021-03-13 MED ORDER — SODIUM CHLORIDE 0.9 % IV BOLUS
1000.0000 mL | Freq: Once | INTRAVENOUS | Status: AC
  Administered 2021-03-13: 1000 mL via INTRAVENOUS

## 2021-03-13 NOTE — ED Triage Notes (Signed)
Pt arrives via [facility] from home. Per report from EMS, Pt sustained  his second fall today, on eliquis, no head injury or trauma noted on assessment,  no change in mental status per family. En route, pt does not have his hearing aides, very [place],  cbg 133, 97%RA, pt has  diarrhea (cdiff+), last pressure 106/52.

## 2021-03-13 NOTE — ED Notes (Signed)
Pt brief changed, no stool present for sample, pt placed on primofit

## 2021-03-13 NOTE — ED Provider Notes (Signed)
Emergency Department Provider Note   I have reviewed the triage vital signs and the nursing notes.   HISTORY  Chief Complaint Fall   HPI Randall Odom is a 88 y.o. male past medical history reviewed below including anticoagulation for A. fib, diabetes, recurrent C. difficile presents with persistent diarrhea and progressively worsening weakness.  Patient arrives by EMS with report of multiple falls today.  Initially they were called out this morning with patient found to have low blood pressures reportedly into the 80s.  He stayed at home but continued to have weakness and had an additional fall.  No head injury identified.  The patient's son at bedside states that he typically ambulates with a walker but over the past couple of days has been very weak.  He continues to have frequent diarrhea.  No fevers.  Notes a history of recurrent C. difficile but unclear if he has active C. difficile not been tested in several months.   Level 5 caveat: AMS    Past Medical History:  Diagnosis Date   Allergic rhinitis    Anxiety    Atrial fibrillation with controlled ventricular response (HCC)    CAD (coronary artery disease)    Carpal tunnel syndrome    Chronic combined systolic and diastolic CHF (congestive heart failure) (HCC)    Complete heart block (HCC)    Depressive disorder, not elsewhere classified    Diabetes mellitus    Diverticulosis of colon (without mention of hemorrhage)    DJD (degenerative joint disease)    Enlarged prostate    Erectile dysfunction    Glaucoma    History of glaucoma   History of BPH    Hypertension    Pure hypercholesterolemia    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 No acute distress.  Eyes: Conjunctivae are normal.  Head: Atraumatic. Nose: No congestion/rhinnorhea. Mouth/Throat: Mucous membranes are dry.  Neck: No stridor.   Cardiovascular: Normal rate, regular rhythm. Good peripheral circulation. Grossly normal heart sounds.   Respiratory: Normal respiratory effort.  No retractions. Lungs CTAB. Gastrointestinal: Soft and nontender. No distention.  Musculoskeletal: No lower extremity tenderness nor edema. No gross deformities of extremities. Neurologic:  Normal speech and language. No gross focal neurologic deficits are appreciated.  Skin:  Skin is warm, dry and intact. No rash noted.  ____________________________________________   LABS (all labs ordered are listed, but only abnormal results are displayed)  Labs Reviewed  RESP PANEL BY RT-PCR (FLU A&B, COVID) ARPGX2 - Abnormal; Notable for the following components:      Result Value   SARS Coronavirus 2 by RT PCR POSITIVE (*)    All other components within normal limits  BASIC METABOLIC PANEL - Abnormal; Notable for the following components:   CO2 21 (*)    Glucose, Bld 126 (*)    Creatinine, Ser 1.75 (*)    GFR, Estimated 36 (*)  All other components within normal limits  CBC - Abnormal; Notable for the following components:   WBC 14.6 (*)    RBC 4.05 (*)    Hemoglobin 12.6 (*)    Platelets 148 (*)    All other components within normal limits  URINALYSIS, ROUTINE W REFLEX MICROSCOPIC - Abnormal; Notable for the following components:   APPearance CLOUDY (*)    Hgb urine dipstick LARGE (*)    Leukocytes,Ua SMALL (*)    All other components within normal limits  PROTIME-INR - Abnormal; Notable for the following components:   Prothrombin Time 19.1 (*)    INR 1.6 (*)    All other components  within normal limits  HEPATIC FUNCTION PANEL - Abnormal; Notable for the following components:   Albumin 3.2 (*)    AST 67 (*)    All other components within normal limits  C-REACTIVE PROTEIN - Abnormal; Notable for the following components:   CRP 1.2 (*)    All other components within normal limits  D-DIMER, QUANTITATIVE (NOT AT [facility]) - Abnormal; Notable for the following components:   D-Dimer, Quant 5.39 (*)    All other components within normal limits  FIBRINOGEN - Abnormal; Notable for the following components:   Fibrinogen 524 (*)    All other components within normal limits  URINALYSIS, MICROSCOPIC (REFLEX) - Abnormal; Notable for the following components:   Bacteria, UA RARE (*)    All other components within normal limits  HEMOGLOBIN A1C - Abnormal; Notable for the following components:   Hgb A1c MFr Bld 7.5 (*)    All other components within normal limits  GLUCOSE, CAPILLARY - Abnormal; Notable for the following components:   Glucose-Capillary 133 (*)    All other components within normal limits  GLUCOSE, CAPILLARY - Abnormal; Notable for the following components:   Glucose-Capillary 138 (*)    All other components within normal limits  GLUCOSE, CAPILLARY - Abnormal; Notable for the following components:   Glucose-Capillary 126 (*)    All other components within normal limits  GLUCOSE, CAPILLARY - Abnormal; Notable for the following components:   Glucose-Capillary 140 (*)    All other components within normal limits  COMPREHENSIVE METABOLIC PANEL - Abnormal; Notable for the following components:   Glucose, Bld 150 (*)    Creatinine, Ser 1.29 (*)    Calcium 8.3 (*)    Total Protein 5.4 (*)    Albumin 2.6 (*)    AST 42 (*)    GFR, Estimated 52 (*)    All other components within normal limits  C-REACTIVE PROTEIN - Abnormal; Notable for the following components:   CRP 2.1 (*)    All other components within normal limits  D-DIMER, QUANTITATIVE - Abnormal; Notable for  the following components:   D-Dimer, Quant 0.78 (*)    All other components within normal limits  MAGNESIUM - Abnormal; Notable for the following components:   Magnesium 1.6 (*)    All other components within normal limits  GLUCOSE, CAPILLARY - Abnormal; Notable for the following components:   Glucose-Capillary 165 (*)    All other components within normal limits  GLUCOSE, CAPILLARY - Abnormal; Notable for the following components:   Glucose-Capillary 137 (*)    All other components within normal limits  GLUCOSE, CAPILLARY - Abnormal; Notable for the following components:   Glucose-Capillary 151 (*)    All other components within normal limits  GLUCOSE, CAPILLARY - Abnormal; Notable for the following components:   Glucose-Capillary 181 (*)  All other components within normal limits  GLUCOSE, CAPILLARY - Abnormal; Notable for the following components:   Glucose-Capillary 131 (*)    All other components within normal limits  GLUCOSE, CAPILLARY - Abnormal; Notable for the following components:   Glucose-Capillary 146 (*)    All other components within normal limits  GLUCOSE, CAPILLARY - Abnormal; Notable for the following components:   Glucose-Capillary 177 (*)    All other components within normal limits  GLUCOSE, CAPILLARY - Abnormal; Notable for the following components:   Glucose-Capillary 200 (*)    All other components within normal limits  GLUCOSE, CAPILLARY - Abnormal; Notable for the following components:   Glucose-Capillary 206 (*)    All other components within normal limits  GLUCOSE, CAPILLARY - Abnormal; Notable for the following components:   Glucose-Capillary 165 (*)    All other components within normal limits  GLUCOSE, CAPILLARY - Abnormal; Notable for the following components:   Glucose-Capillary 238 (*)    All other components within normal limits  CBG MONITORING, ED - Abnormal; Notable for the following components:   Glucose-Capillary 133 (*)    All other  components within normal limits  CBG MONITORING, ED - Abnormal; Notable for the following components:   Glucose-Capillary 141 (*)    All other components within normal limits  CULTURE, BLOOD (ROUTINE X 2)  CULTURE, BLOOD (ROUTINE X 2)  LIPASE, BLOOD  LACTIC ACID, PLASMA  PROCALCITONIN  LACTATE DEHYDROGENASE  FERRITIN  TRIGLYCERIDES  GLUCOSE, CAPILLARY  LACTATE DEHYDROGENASE  CBG MONITORING, ED   ____________________________________________  EKG   EKG Interpretation  Date/Time:  Tuesday March 13 2021 19:16:48 EST Ventricular Rate:  72 PR Interval:    QRS Duration: 110 QT Interval:  404 QTC Calculation: 442 R Axis:   -61 Text Interpretation: Accelerated Junctional rhythm with frequent ventricular-paced complexes Left axis deviation Inferior infarct , age undetermined Anterolateral infarct , age undetermined No significant change since last tracing When compared with ECG of 11-Sep-2016 18:33, PREVIOUS ECG IS PRESENT Confirmed by Blanchie Dessert [phone]) on 03/14/2021 11:56:01 AM        ____________________________________________  RADIOLOGY  CT head, c-spine, and CXR  ____________________________________________   PROCEDURES  Procedure(s) performed:   Procedures  None  ____________________________________________   INITIAL IMPRESSION / ASSESSMENT AND PLAN / ED COURSE  Pertinent labs & imaging results that were available during my care of the patient were reviewed by me and considered in my medical decision making (see chart for details).   This patient is Presenting for Evaluation of weakness and AMS, which does require a range of treatment options, and is a complaint that involves a high risk of morbidity and mortality.  The Differential Diagnoses includes altered mental status includes but is not exclusive to alcohol, illicit or prescription medications, intracranial pathology such as stroke, intracerebral hemorrhage, fever or infectious causes including  sepsis, hypoxemia, uremia, trauma, endocrine related disorders such as diabetes, hypoglycemia, thyroid-related diseases, etc.  Medical Decision Making: Summary:  Patient presents to the emergency department with altered mental status and generalized weakness.  2 falls at home with low blood pressures earlier in the day.  Soft BP here but improving with IV fluids.  Doubt sepsis clinically but this is a consideration.  No focal neurodeficits to strongly suspect stroke or bleed.  CT imaging and C-spine independently reviewed and are without acute finding.  Chest x-ray independently reviewed showing some stable cardiomegaly but no infiltrate.   Patient's COVID test is ultimately coming back positive which may  be contributing.  Patient also with recurrent C. difficile and ongoing diarrhea.  Plan to send sample when available.  Patient clinically does appear dehydrated.     Patient Vitals for the past 24 hrs:  BP Temp Temp src Pulse Resp SpO2  03/18/21 0359 140/62 (!) 100.5 F (38.1 C) Oral 74 19 100 %  03/17/21 1900 137/60 98.6 F (37 C) Oral 73 18 99 %    2:33 PM Reevaluation with update and discussion with patient and son. After initial assessment and treatment, an updated evaluation reveals patient is awake and alert with improved BP. Illness risk,treatment plan, and admission plan discussed. [facility] Nashea Chumney      Critical Interventions- IVF, labs, and CT imaging; to evaluate  Chief Complaint  Patient presents with   Fall    and assess for illness characterized as Acute, Previously Undiagnosed, Uncertain Prognosis, Complicated, Systemic Symptoms, and Threat to Life/Bodily Function   After These Interventions, the Patient was reevaluated and was found to have COVID and dehydration likely 2/2 diarrhea.   2:33 PM-Consult complete with Hospitalist.   Discussed patient's case with [facility] to request admission. Patient and family (if present) updated with plan. Care transferred to [facility] service.  I  reviewed all nursing notes, vitals, pertinent old records, EKGs, labs, imaging (as available).      I did Additional Historical Information from son, as the patient is altered.  Patient with generalized weakness worsening in the past 24 to 48 hours.  Diarrhea has been acute on chronic with recurrent C. difficile in the past but no ongoing C. difficile treatment currently  I decided to review pertinent External Data, and in summary patient last seen by ID in the summer of 2022 for recurrent c. Diff.    Clinical Laboratory Tests Ordered, included CBC, chemistry, COVID, C. Difficile.  Patient noted to have a leukocytosis and positive COVID test.  No hypoxemia or infiltrate on chest x-ray independently reviewed by me.  Have added on additional inflammatory markers and D-dimer in the setting of COVID infection likely contributing to generalized weakness.  Radiologic Tests Ordered, included CT head, c-spine, and CXR.  Images independently reviewed by me.  Agree with radiology interpretation.  No infiltrate or pulmonary edema on chest x-ray.   Cardiac Monitor Tracing which shows NSR.    ____________________________________________  FINAL CLINICAL IMPRESSION(S) / ED DIAGNOSES  Final diagnoses:  Fall, initial encounter  Generalized weakness  COVID-19     MEDICATIONS GIVEN DURING THIS VISIT:  Medications  apixaban (ELIQUIS) tablet 5 mg (5 mg Oral Given 03/18/21 1051)  insulin aspart (novoLOG) injection 0-6 Units (2 Units Subcutaneous Given 03/18/21 1214)  insulin aspart (novoLOG) injection 0-5 Units (2 Units Subcutaneous Given 03/17/21 2106)  0.9 %  sodium chloride infusion ( Intravenous New Bag/Given 03/14/21 0259)  acetaminophen (TYLENOL) tablet 650 mg (has no administration in time range)  ondansetron (ZOFRAN) tablet 4 mg (has no administration in time range)    Or  ondansetron (ZOFRAN) injection 4 mg (has no administration in time range)  0.9 %  sodium chloride infusion ( Intravenous New  Bag/Given 03/14/21 2050)  sodium chloride 0.9 % bolus 1,000 mL (0 mLs Intravenous Stopped 03/13/21 2222)  remdesivir 200 mg in sodium chloride 0.9% 250 mL IVPB (0 mg Intravenous Stopped 03/14/21 0527)    Followed by  remdesivir 100 mg in sodium chloride 0.9 % 100 mL IVPB (0 mg Intravenous Stopped 03/16/21 1019)  magnesium sulfate IVPB 2 g 50 mL (0 g Intravenous Stopped 03/16/21  1228)      Note:  This document was prepared using Dragon voice recognition software and may include unintentional dictation errors.  Nanda Quinton, MD, [facility] Emergency Medicine    Diangelo Radel, [facility], MD 03/18/21 1440

## 2021-03-14 ENCOUNTER — Encounter (HOSPITAL_COMMUNITY): Payer: Self-pay | Admitting: Family Medicine

## 2021-03-14 ENCOUNTER — Other Ambulatory Visit: Payer: Self-pay

## 2021-03-14 DIAGNOSIS — F0390 Unspecified dementia without behavioral disturbance: Secondary | ICD-10-CM | POA: Diagnosis not present

## 2021-03-14 DIAGNOSIS — N1831 Chronic kidney disease, stage 3a: Secondary | ICD-10-CM

## 2021-03-14 DIAGNOSIS — R5381 Other malaise: Secondary | ICD-10-CM

## 2021-03-14 DIAGNOSIS — R7989 Other specified abnormal findings of blood chemistry: Secondary | ICD-10-CM | POA: Insufficient documentation

## 2021-03-14 DIAGNOSIS — U071 COVID-19: Secondary | ICD-10-CM

## 2021-03-14 DIAGNOSIS — N179 Acute kidney failure, unspecified: Secondary | ICD-10-CM | POA: Diagnosis not present

## 2021-03-14 LAB — URINALYSIS, ROUTINE W REFLEX MICROSCOPIC
Bilirubin Urine: NEGATIVE
Glucose, UA: NEGATIVE mg/dL
Ketones, ur: NEGATIVE mg/dL
Nitrite: NEGATIVE
Protein, ur: NEGATIVE mg/dL
Specific Gravity, Urine: 1.02 (ref 1.005–1.030)
pH: 6 (ref 5.0–8.0)

## 2021-03-14 LAB — URINALYSIS, MICROSCOPIC (REFLEX)

## 2021-03-14 LAB — GLUCOSE, CAPILLARY
Glucose-Capillary: 133 mg/dL — ABNORMAL HIGH (ref 70–99)
Glucose-Capillary: 138 mg/dL — ABNORMAL HIGH (ref 70–99)

## 2021-03-14 LAB — CBG MONITORING, ED
Glucose-Capillary: 133 mg/dL — ABNORMAL HIGH (ref 70–99)
Glucose-Capillary: 95 mg/dL (ref 70–99)
Glucose-Capillary: 141 mg/dL — ABNORMAL HIGH (ref 70–99)

## 2021-03-14 LAB — C-REACTIVE PROTEIN: CRP: 1.2 mg/dL — ABNORMAL HIGH (ref <1.0)

## 2021-03-14 LAB — PROCALCITONIN: Procalcitonin: 0.17 ng/mL

## 2021-03-14 LAB — FERRITIN: Ferritin: 155 ng/mL (ref 24–336)

## 2021-03-14 LAB — HEMOGLOBIN A1C
Hgb A1c MFr Bld: 7.5 % — ABNORMAL HIGH (ref 4.8–5.6)
Mean Plasma Glucose: 168.55 mg/dL

## 2021-03-14 MED ORDER — INSULIN ASPART 100 UNIT/ML IJ SOLN
0.0000 [IU] | Freq: Three times a day (TID) | INTRAMUSCULAR | Status: DC
  Administered 2021-03-16 – 2021-03-17 (×3): 1 [IU] via SUBCUTANEOUS
  Administered 2021-03-18: 2 [IU] via SUBCUTANEOUS
  Administered 2021-03-18 – 2021-03-19 (×4): 1 [IU] via SUBCUTANEOUS
  Administered 2021-03-19: 2 [IU] via SUBCUTANEOUS
  Administered 2021-03-20: 1 [IU] via SUBCUTANEOUS
  Administered 2021-03-20: 2 [IU] via SUBCUTANEOUS
  Administered 2021-03-21 – 2021-03-26 (×5): 1 [IU] via SUBCUTANEOUS

## 2021-03-14 MED ORDER — SODIUM CHLORIDE 0.9 % IV SOLN: INTRAVENOUS | Status: AC

## 2021-03-14 MED ORDER — SODIUM CHLORIDE 0.9 % IV SOLN
200.0000 mg | Freq: Once | INTRAVENOUS | Status: AC
  Administered 2021-03-14: 200 mg via INTRAVENOUS
  Filled 2021-03-14: qty 40

## 2021-03-14 MED ORDER — ONDANSETRON HCL 4 MG PO TABS: 4.0000 mg | ORAL_TABLET | Freq: Four times a day (QID) | ORAL | Status: DC | PRN

## 2021-03-14 MED ORDER — ACETAMINOPHEN 325 MG PO TABS
650.0000 mg | ORAL_TABLET | Freq: Four times a day (QID) | ORAL | Status: DC | PRN
  Administered 2021-03-18: 650 mg via ORAL
  Filled 2021-03-14 (×2): qty 2

## 2021-03-14 MED ORDER — INSULIN ASPART 100 UNIT/ML IJ SOLN
0.0000 [IU] | Freq: Every day | INTRAMUSCULAR | Status: DC
  Administered 2021-03-17: 2 [IU] via SUBCUTANEOUS
  Administered 2021-03-23: 1 [IU] via SUBCUTANEOUS

## 2021-03-14 MED ORDER — APIXABAN 5 MG PO TABS
5.0000 mg | ORAL_TABLET | Freq: Two times a day (BID) | ORAL | Status: DC
  Administered 2021-03-14 – 2021-03-26 (×24): 5 mg via ORAL
  Filled 2021-03-14 (×25): qty 1

## 2021-03-14 MED ORDER — SODIUM CHLORIDE 0.9 % IV SOLN
100.0000 mg | Freq: Every day | INTRAVENOUS | Status: AC
  Administered 2021-03-15 – 2021-03-16 (×2): 100 mg via INTRAVENOUS
  Filled 2021-03-14 (×2): qty 20

## 2021-03-14 MED ORDER — ONDANSETRON HCL 4 MG/2ML IJ SOLN: 4.0000 mg | Freq: Four times a day (QID) | INTRAMUSCULAR | Status: DC | PRN

## 2021-03-14 NOTE — Assessment & Plan Note (Signed)
-   Continue Eliquis 

## 2021-03-14 NOTE — Assessment & Plan Note (Signed)
-   Continue Eliquis - Hold Lasix, statin, and lisinopril

## 2021-03-14 NOTE — Assessment & Plan Note (Signed)
-   No signs or symptoms of exacerbation or volume overload - Continue holding Lasix in setting of worsened renal function

## 2021-03-14 NOTE — Evaluation (Signed)
Physical Therapy Evaluation Patient Details Name: ADRIANA LINA MRN: [id number] DOB: [birth year] Today's Date: 03/14/2021  History of Present Illness  Pt is a 88 y/o male admitted secondary to weakness and multiple falls. PMH includes dementia, HTN, CHF, CAD, a fib, and s/p pacemaker.  Clinical Impression  Pt admitted secondary to problem above with deficits below. Pt with dementia at baseline and very repetitive throughout session. Required mod A to sit at EOB, but pt reporting increased pain and requested to return to supine. Further mobility deferred. Pt with multiple falls at home and anticipate pt will benefit from SNF level therapies at d/c. Will continue to follow acutely.        Recommendations for follow up therapy are one component of a multi-disciplinary discharge planning process, led by the attending physician.  Recommendations may be updated based on patient status, additional functional criteria and insurance authorization.  Follow Up Recommendations Skilled nursing-short term rehab (<3 hours/day)    Assistance Recommended at Discharge Frequent or constant Supervision/Assistance  Patient can return home with the following  A lot of help with walking and/or transfers;A lot of help with bathing/dressing/bathroom;Help with stairs or ramp for entrance;Direct supervision/assist for financial management;Direct supervision/assist for medications management;Assist for transportation;Assistance with cooking/housework    Equipment Recommendations Wheelchair (measurements PT);Wheelchair cushion (measurements PT)  Recommendations for Other Services       Functional Status Assessment Patient has had a recent decline in their functional status and demonstrates the ability to make significant improvements in function in a reasonable and predictable amount of time.     Precautions / Restrictions Precautions Precautions: Fall Restrictions Weight Bearing Restrictions: No      Mobility   Bed Mobility Overal bed mobility: Needs Assistance Bed Mobility: Sit to Supine;Supine to Sit     Supine to sit: Mod assist Sit to supine: Mod assist   General bed mobility comments: Mod A for trunk and LE assist. Required assist to scoot hips to EOB. Pt reporting increased pain and requesting to return to sitting.    Transfers                        Ambulation/Gait                  Stairs            Wheelchair Mobility    Modified Rankin (Stroke Patients Only)       Balance Overall balance assessment: Needs assistance Sitting-balance support: No upper extremity supported;Feet supported Sitting balance-Leahy Scale: Fair                                       Pertinent Vitals/Pain Pain Assessment: Faces Faces Pain Scale: Hurts even more Breathing: normal Negative Vocalization: none Facial Expression: smiling or inexpressive Body Language: relaxed Consolability: no need to console PAINAD Score: 0 Pain Location: RLE Pain Descriptors / Indicators: Grimacing;Guarding;Moaning Pain Intervention(s): Limited activity within patient's tolerance;Monitored during session;Repositioned    Home Living Family/patient expects to be discharged to:: Private residence Living Arrangements: Spouse/significant other Available Help at Discharge: Family Type of Home: House Home Access: Stairs to enter Entrance Stairs-Rails: None Entrance Stairs-Number of Steps: 2   Home Layout: One level Home Equipment: Conservation officer, nature (2 wheels);Tub bench      Prior Function Prior Level of Function : Needs assist  Mobility Comments: Pt only ambulates short distances using RW. Reports he does not leave the house ADLs Comments: Wife assists with ADL tasks     Hand Dominance        Extremity/Trunk Assessment   Upper Extremity Assessment Upper Extremity Assessment: Defer to OT evaluation    Lower Extremity Assessment Lower Extremity  Assessment: Generalized weakness;RLE deficits/detail RLE Deficits / Details: Limited ROM in RLE secondary to pain    Cervical / Trunk Assessment Cervical / Trunk Assessment: Kyphotic  Communication   Communication: HOH  Cognition Arousal/Alertness: Awake/alert Behavior During Therapy: WFL for tasks assessed/performed Overall Cognitive Status: History of cognitive impairments - at baseline                                 General Comments: Dementia at baseline. Very repetitive during session        General Comments General comments (skin integrity, edema, etc.): No family present    Exercises     Assessment/Plan    PT Assessment Patient needs continued PT services  PT Problem List Decreased strength;Decreased activity tolerance;Decreased balance;Decreased mobility;Decreased cognition;Decreased knowledge of use of DME;Decreased safety awareness;Decreased knowledge of precautions;Cardiopulmonary status limiting activity       PT Treatment Interventions DME instruction;Gait training;Therapeutic activities;Functional mobility training;Therapeutic exercise;Balance training;Wheelchair mobility training;Patient/family education    PT Goals (Current goals can be found in the Care Plan section)  Acute Rehab PT Goals Patient Stated Goal: to see his wife PT Goal Formulation: With patient Time For Goal Achievement: 03/28/21 Potential to Achieve Goals: Good    Frequency Min 2X/week     Co-evaluation               AM-PAC PT "6 Clicks" Mobility  Outcome Measure Help needed turning from your back to your side while in a flat bed without using bedrails?: A Little Help needed moving from lying on your back to sitting on the side of a flat bed without using bedrails?: A Lot Help needed moving to and from a bed to a chair (including a wheelchair)?: A Lot Help needed standing up from a chair using your arms (e.g., wheelchair or bedside chair)?: A Lot Help needed to  walk in hospital room?: A Lot Help needed climbing 3-5 steps with a railing? : Total 6 Click Score: 12    End of Session   Activity Tolerance: Patient limited by pain Patient left: in bed;with call bell/phone within reach;with bed alarm set Nurse Communication: Mobility status PT Visit Diagnosis: Unsteadiness on feet (R26.81);Muscle weakness (generalized) (M62.81);History of falling (Z91.81);Repeated falls (R29.6)    Time: 4098-1191 PT Time Calculation (min) (ACUTE ONLY): 21 min   Charges:   PT Evaluation $PT Eval Moderate Complexity: 1 Mod          Reuel Derby, PT, DPT  Acute Rehabilitation Services  Pager: [phone] Office: [phone]   Rudean Hitt 03/14/2021, 4:36 PM

## 2021-03-14 NOTE — Assessment & Plan Note (Signed)
-   Continue SSI and CBG monitoring ?

## 2021-03-14 NOTE — Assessment & Plan Note (Addendum)
-   Chronic and has been treated for C. difficile multiple times in the past -Repeat C. difficile testing ordered on admission however patient did not have any further diarrhea after admission

## 2021-03-14 NOTE — Hospital Course (Addendum)
Randall Odom is a 88 year old male with PMH afib, CAD, combined s/d CHF, CHB s/p pacer, DMII, DJD, diverticulosis, glaucoma, BPH, HTN, HLD who presented to the ER with worsening fatigue and falls.  He was described to be sleeping more at home and having had 4 mechanical falls prior to admission.  He also had developed a nonproductive cough with loose stools which the latter are described as chronic. On work-up in the ER he was found to be positive for COVID-19 and was admitted for treatment and further work-up.

## 2021-03-14 NOTE — H&P (Addendum)
History and Physical    Randall Odom WNU:[id number] DOB: October 24, 1926 DOA: 03/13/2021  PCP: Prince Solian, MD   Patient coming from: Home   Chief Complaint: Fatigue, falls   HPI: Randall Odom is a pleasant 88 y.o. male with medical history significant for heart block with pacer, HFrEF, atrial fibrillation on Eliquis, diabetes, hypertension, dementia, and chronic diarrhea, now presenting with fatigue and falls.  Patient has been sleeping more for at least a couple days, had 2 mechanical falls yesterday and then 2 more today.  There was no apparent injury from the falls but family was concerned due to his increased fatigue and as he has not previously had issues with falling.  He has not appeared to have any unilateral weakness or facial asymmetry.  He has not expressed any specific complaints.  Nonproductive cough has been noted recently.  He has 3-4 loose stools daily which is unchanged.  ED Course: Upon arrival to the ED, patient is found to be afebrile, saturating low to mid 90s on room air, and with blood pressure 96/49.  EKG features accelerated junctional rhythm with frequent PVCs and LAD.  Chest x-ray with stable cardiomegaly and minimal left basilar atelectasis or scar.  Chemistry panel notable for creatinine 1.75 and AST 67.  CBC with leukocytosis to 14,600 and slight thrombocytopenia.  COVID-19 PCR is positive.  Patient was given a liter of saline in the ED.  Review of Systems:  Unable to complete ROS secondary to patient's clinical condition.  Past Medical History:  Diagnosis Date   Allergic rhinitis    Anxiety    Atrial fibrillation with controlled ventricular response (HCC)    CAD (coronary artery disease)    Carpal tunnel syndrome    Chronic combined systolic and diastolic CHF (congestive heart failure) (HCC)    Complete heart block (HCC)    Depressive disorder, not elsewhere classified    Diabetes mellitus    Diverticulosis of colon (without mention of hemorrhage)     DJD (degenerative joint disease)    Enlarged prostate    Erectile dysfunction    Glaucoma    History of glaucoma   History of BPH    Hypertension    Pure hypercholesterolemia    Recurrent Clostridium difficile diarrhea 08/14/2020   Recurrent falls    Sacroiliitis, not elsewhere classified ([place])    Thoracic or lumbosacral neuritis or radiculitis, unspecified     Past Surgical History:  Procedure Laterality Date   APPENDECTOMY  1966   EP IMPLANTABLE DEVICE N/A 11/16/2015   PPM generator change by Dr Rayann Heman,  S[facility] Ass[employer] SR PPM for complete heart block   PACEMAKER INSERTION  09/28/1997   DDD for high-grade AV Block. Had an upgrade to a dual chamber device on 09/09/97. Replaced dual chamber battery on 12/06/04. New device is a Financial risk analyst Identity ADX XL DR, model [id number], seriel [id number].   TONSILLECTOMY  1953    Social History:   reports that he quit smoking about 53 years ago. His smoking use included cigarettes. He has a 37.50 pack-year smoking history. He has never used smokeless tobacco. He reports that he does not drink alcohol and does not use drugs.  Allergies  Allergen Reactions   Spironolactone Diarrhea and Nausea And Vomiting    Family History  Problem Relation Age of Onset   Cancer Mother        breast   CVA Father    Anemia Brother    Heart disease Brother  Prior to Admission medications   Medication Sig Start Date End Date Taking? Authorizing Provider  Cholecalciferol (VITAMIN D) 2000 UNITS tablet Take 2,000 Units by mouth daily.    [provider]  ELIQUIS 5 MG TABS tablet TAKE 1 TABLET BY MOUTH TWICE A DAY 07/12/19   Allred, Jeneen Rinks, MD  fluticasone (FLONASE) 50 MCG/ACT nasal spray Place 2 sprays into the nose daily as needed for allergies. 05/27/12   Angiulli, Lavon Paganini, PA-C  furosemide (LASIX) 40 MG tablet Take 1 tablet (40 mg total) by mouth daily. 09/16/16   Aline August, MD  glipiZIDE (GLUCOTROL) 5 MG tablet Take 5 mg by mouth daily before breakfast.     [provider]  guaiFENesin (MUCINEX) 600 MG 12 hr tablet Take 1 tablet (600 mg total) by mouth 2 (two) times daily as needed for cough or to loosen phlegm. 09/16/16   Aline August, MD  lisinopril (PRINIVIL,ZESTRIL) 5 MG tablet Take 5 mg by mouth daily.    [provider]  Multiple Vitamin (MULTIVITAMIN WITH MINERALS) TABS Take 1 tablet by mouth daily. 05/27/12   Angiulli, Lavon Paganini, PA-C  potassium chloride (KLOR-CON) 10 MEQ tablet TAKE 1 TABLET BY MOUTH EVERY DAY 05/29/20   Isaiah Serge, NP  simvastatin (ZOCOR) 40 MG tablet Take 1 tablet (40 mg total) by mouth at bedtime. 05/27/12   Angiulli, Lavon Paganini, PA-C  terazosin (HYTRIN) 5 MG capsule Take 1 capsule (5 mg total) by mouth at bedtime. 05/27/12   Angiulli, Lavon Paganini, PA-C  TRAVATAN Z 0.004 % SOLN ophthalmic solution Place 1 drop into both eyes at bedtime.    [provider]    Physical Exam: Vitals:   03/13/21 2230 03/13/21 2306 03/13/21 2330 03/13/21 2345  BP: 117/60  (!) 127/46 (!) 104/51  Pulse: 83  71 72  Resp: (!) 21  20 19   Temp:  98.8 F (37.1 C)    TempSrc:  Oral    SpO2: 92%  95% 93%    Constitutional: NAD, calm  Eyes: PERTLA, lids and conjunctivae normal ENMT: Mucous membranes are moist. Posterior pharynx clear of any exudate or lesions.   Neck: supple, no masses  Respiratory:  no wheezing, no crackles. No accessory muscle use.  Cardiovascular: S1 & S2 heard, regular rate and rhythm. No significant JVD. Abdomen: No distension, no tenderness, soft. Bowel sounds active.  Musculoskeletal: no clubbing / cyanosis. No joint deformity upper and lower extremities.   Skin: no significant rashes, lesions, ulcers. Warm, dry, well-perfused. Neurologic: CN 2-12 grossly intact. Moving all extremities. Alert and oriented to person only.  Psychiatric: Pleasant. Cooperative.    Labs and Imaging on Admission: I have personally reviewed following labs and imaging studies  CBC: Recent Labs  Lab 03/13/21 1919   WBC 14.6*  HGB 12.6*  HCT 40.0  MCV 98.8  PLT 098*   Basic Metabolic Panel: Recent Labs  Lab 03/13/21 1919  NA 135  K 4.3  CL 101  CO2 21*  GLUCOSE 126*  BUN 18  CREATININE 1.75*  CALCIUM 9.3   GFR: CrCl cannot be calculated (Unknown ideal weight.). Liver Function Tests: Recent Labs  Lab 03/13/21 1956  AST 67*  ALT 30  ALKPHOS 73  BILITOT 0.6  PROT 6.8  ALBUMIN 3.2*   Recent Labs  Lab 03/13/21 1956  LIPASE 30   No results for input(s): AMMONIA in the last 168 hours. Coagulation Profile: Recent Labs  Lab 03/13/21 1956  INR 1.6*   Cardiac Enzymes: No results  for input(s): CKTOTAL, CKMB, CKMBINDEX, TROPONINI in the last 168 hours. BNP (last 3 results) No results for input(s): PROBNP in the last 8760 hours. HbA1C: No results for input(s): HGBA1C in the last 72 hours. CBG: No results for input(s): GLUCAP in the last 168 hours. Lipid Profile: Recent Labs    03/13/21 2206  TRIG 82   Thyroid Function Tests: No results for input(s): TSH, T4TOTAL, FREET4, T3FREE, THYROIDAB in the last 72 hours. Anemia Panel: Recent Labs    03/13/21 2206  FERRITIN 155   Urine analysis:    Component Value Date/Time   COLORURINE YELLOW 03/13/2021 1919   APPEARANCEUR CLOUDY (A) 03/13/2021 1919   LABSPEC 1.020 03/13/2021 1919   PHURINE 6.0 03/13/2021 1919   GLUCOSEU NEGATIVE 03/13/2021 1919   HGBUR LARGE (A) 03/13/2021 1919   BILIRUBINUR NEGATIVE 03/13/2021 1919   KETONESUR NEGATIVE 03/13/2021 1919   PROTEINUR NEGATIVE 03/13/2021 1919   UROBILINOGEN 0.2 05/26/2012 0623   NITRITE NEGATIVE 03/13/2021 1919   LEUKOCYTESUR SMALL (A) 03/13/2021 1919   Sepsis Labs: @LABRCNTIP (procalcitonin:4,lacticidven:4) ) Recent Results (from the past 240 hour(s))  Resp Panel by RT-PCR (Flu A&B, Covid) Nasopharyngeal Swab     Status: Abnormal   Collection Time: 03/13/21  7:46 PM   Specimen: Nasopharyngeal Swab; Nasopharyngeal(NP) swabs in vial transport medium  Result Value Ref  Range Status   SARS Coronavirus 2 by RT PCR POSITIVE (A) NEGATIVE Final    Comment: (NOTE) SARS-CoV-2 target nucleic acids are DETECTED.  The SARS-CoV-2 RNA is generally detectable in upper respiratory specimens during the acute phase of infection. Positive results are indicative of the presence of the identified virus, but do not rule out bacterial infection or co-infection with other pathogens not detected by the test. Clinical correlation with patient history and other diagnostic information is necessary to determine patient infection status. The expected result is Negative.  Fact Sheet for Patients: EntrepreneurPulse.com.au  Fact Sheet for Healthcare Providers: IncredibleEmployment.be  This test is not yet approved or cleared by the [place] FDA and  has been authorized for detection and/or diagnosis of SARS-CoV-2 by FDA under an Emergency Use Authorization (EUA).  This EUA will remain in effect (meaning this test can be used) for the duration of  the COVID-19 declaration under Section 564(b)(1) of the A ct, 21 U.S.C. section 360bbb-3(b)(1), unless the authorization is terminated or revoked sooner.     Influenza A by PCR NEGATIVE NEGATIVE Final   Influenza B by PCR NEGATIVE NEGATIVE Final    Comment: (NOTE) The Xpert Xpress SARS-CoV-2/FLU/RSV plus assay is intended as an aid in the diagnosis of influenza from Nasopharyngeal swab specimens and should not be used as a sole basis for treatment. Nasal washings and aspirates are unacceptable for Xpert Xpress SARS-CoV-2/FLU/RSV testing.  Fact Sheet for Patients: EntrepreneurPulse.com.au  Fact Sheet for Healthcare Providers: IncredibleEmployment.be  This test is not yet approved or cleared by the [place] FDA and has been authorized for detection and/or diagnosis of SARS-CoV-2 by FDA under an Emergency Use Authorization (EUA). This EUA will  remain in effect (meaning this test can be used) for the duration of the COVID-19 declaration under Section 564(b)(1) of the Act, 21 U.S.C. section 360bbb-3(b)(1), unless the authorization is terminated or revoked.  Performed at [facility], [place] [address], [place], [place] [address]      Radiological Exams on Admission: CT Head Wo Contrast  Result Date: 03/13/2021 CLINICAL DATA:  [place], trauma EXAM: CT HEAD WITHOUT CONTRAST TECHNIQUE: Contiguous axial images were  obtained from the base of the skull through the vertex without intravenous contrast. COMPARISON:  05/06/2011 FINDINGS: Brain: Chronic small vessel ischemic changes are seen within the bilateral insula and external capsule. No acute infarct or hemorrhage. Lateral ventricles and midline structures are otherwise unremarkable. No acute extra-axial fluid collections. No mass effect. Vascular: Dense atherosclerosis of the internal carotid arteries. No hyperdense vessel. Skull: Normal. Negative for fracture or focal lesion. Sinuses/Orbits: No acute finding. Other: None. IMPRESSION: 1. No acute intracranial process. Electronically Signed   By: Randa Ngo M.D.   On: 03/13/2021 21:32   CT Cervical Spine Wo Contrast  Result Date: 03/13/2021 CLINICAL DATA:  [place], trauma EXAM: CT CERVICAL SPINE WITHOUT CONTRAST TECHNIQUE: Multidetector CT imaging of the cervical spine was performed without intravenous contrast. Multiplanar CT image reconstructions were also generated. COMPARISON:  None. FINDINGS: Alignment: Alignment is grossly anatomic. Skull base and vertebrae: No acute fracture. No primary bone lesion or focal pathologic process. Soft tissues and spinal canal: No prevertebral fluid or swelling. No visible canal hematoma. Marked atherosclerosis at the carotid bifurcations. Disc levels: Mild spondylosis at C5-6 and C6-7. Mild diffuse facet hypertrophy. Upper chest: Central airways are patent. Emphysematous changes at the lung apices. Other:  Reconstructed images demonstrate no additional findings. IMPRESSION: 1. No acute cervical spine fracture. 2. Mild spondylosis and facet hypertrophy. 3. Emphysema. Electronically Signed   By: Randa Ngo M.D.   On: 03/13/2021 21:41   DG Chest Portable 1 View  Result Date: 03/13/2021 CLINICAL DATA:  Weakness, fall. EXAM: PORTABLE CHEST 1 VIEW COMPARISON:  Chest x-ray 09/10/2016. FINDINGS: Right-sided pacemaker is again seen. The aorta is tortuous with atherosclerotic calcifications. The heart is mildly enlarged, unchanged. There is some minimal strandy opacities in the left lung base. There is no pleural effusion or pneumothorax identified. No acute fractures are seen. IMPRESSION: 1. Minimal left basilar atelectasis or scarring. 2. Stable cardiomegaly. Electronically Signed   By: Ronney Asters M.D.   On: 03/13/2021 20:18    EKG: Independently reviewed. Accelerated junctional rhythm, frequent PVCs, LAD.   Assessment/Plan   1. Fatigue, falls  - 88 yr old with dementia and chronic diarrhea p/w 2 days of increased fatigue and falls and found to have COVID-19  - No acute findings on head CT  - Cognitively at baseline per family  - Suspect this is secondary to acute COVID infection  - Treat COVID, initiate fall precautions, continue supportive care    2. COVID-19  - Presents with fatigue, non-productive cough, and falls and found to have COVID-19  - Has hx of chronic hypoxic resp failure but currently rm air with unlabored respirations  - Continue isolation, start remdesivir, trend inflammatory markers   3. AKI superimposed on CKD IIIa  - SCr is 1.75 on admission, up from 1.32 in June  - Likely acute prerenal azotemia in setting of recent fatigue with decreased oral intake  - Hold Lasix and lisinopril, start gentle/cautious IVF hydration, renally-dose medications, repeat chem panel in am   4. Atrial fibrillation  - CHADS-VASc at least 6 (age x2, HTN, CAD, DM, CHF) - Continue Eliquis for now,  reassess risk/benefit if balance issues persist despite recovery from COVID, decrease dose if renal function fails to improve as expected   5. CHFrEF  - EF was 40-45% in 2018  - Appears compensated  - Hold Lasix and ACE-i initially and start a gentle IVF hydration in setting of recent decreased oral intake with increased creatinine, monitor volume status   6.  Diarrhea  - Family reports diarrhea for more than a yr, treated for C diff multiple times  - No recent change, continues to have 3-4 loose stools daily  - C diff testing ordered from ED but has not provided sample yet    7. Hypertension  - SBP 90s-low 100s in ED  - Hold antihypertensives   8. Dementia  - Oriented to person and place at baseline, needs assistance with ADLs  - Calm and cooperative in ED  - Delirium precautions   9. Type II DM  - A1c was 8.0% in 2018  - Check CBGs and use low-intensity SSI for now   10. Elevated d-dimer  - D-dimer is 5.39 on admission  - He has been adherent with Eliquis per family, is not hypoxic  - Trend, consider LE venous dopplers    DVT prophylaxis: Eliquis  Code Status: DNR, confirmed with family on admission  Level of Care: Level of care: Med-Surg Family Communication: Son by phone  Disposition Plan:  Patient is from: Home  Anticipated d/c is to: TBD Anticipated d/c date is: 1/4 or 03/15/21  Patient currently: Pending PT assessment, repeat labs  Consults called: none  Admission status: Observation     Vianne Bulls, MD [facility]  03/14/2021, 1:44 AM

## 2021-03-14 NOTE — Assessment & Plan Note (Signed)
-   BP remains in normal range.  Continue holding home terazosin

## 2021-03-14 NOTE — Progress Notes (Signed)
°Progress Note ° ° ° °Randall Odom   °MRN:[id number]  °DOB: 04/08/1926  °DOA: 03/13/2021     0 °PCP: Avva, Ravisankar, MD ° °Initial CC: Fatigue ° °Hospital Course: °[name] is a 88-year-old male with PMH afib, CAD, combined s/d CHF, CHB s/p pacer, DMII, DJD, diverticulosis, glaucoma, BPH, HTN, HLD who presented to the ER with worsening fatigue and falls.  He was described to be sleeping more at home and having had 4 mechanical falls prior to admission.  He also had developed a nonproductive cough with loose stools which the latter are described as chronic. °On work-up in the ER he was found to be positive for COVID-19 and was admitted for treatment and further work-up. ° °Interval History:  °Seen in the ER this morning.  Significant lethargy appreciated but resting in bed in no distress still on room air. ° °Assessment & Plan: °* COVID-19 virus infection °- No hypoxia on admission.  Does have multiple risk factors for progression to severe disease.  Agree with 3-day course of remdesivir °- Continue trending inflammatory markers ° °Acute renal failure superimposed on stage 3a chronic kidney disease (HCC)- (present on admission) °- patient has history of CKD3a. Baseline creat ~ 1.3, eGFR 47 °- patient presents with increase in creat >0.3 mg/dL above baseline, creat increase >1.5x baseline presumed to have occurred within past 7 days PTA °- creat 1.75 on admission; presumed prerenal from poor intake °- continue IVF and repeat BMP in am ° °Permanent atrial fibrillation (HCC)- (present on admission) °- Continue Eliquis ° °Diarrhea- (present on admission) °- Chronic and has been treated for C. difficile multiple times in the past °- Repeat C. difficile testing ordered on admission however no sample provided yet ° °Dementia (HCC)- (present on admission) °- No chronic home meds °- Continue delirium precautions ° °Chronic combined systolic and diastolic heart failure (HCC)- (present on admission) °- No signs or symptoms of  exacerbation or volume overload °- Continue holding Lasix in setting of worsened renal function ° °Essential hypertension- (present on admission) °- BP remains in normal range.  Continue holding home terazosin ° °Physical deconditioning- (present on admission) °- Deconditioned due to underlying COVID-19 infection °- Follow-up PT eval: SNF recommended ° °CAD (coronary artery disease)- (present on admission) °- Continue Eliquis °- Hold Lasix, statin, and lisinopril ° °Diabetes mellitus (HCC) °- Continue SSI and CBG monitoring ° ° ° °Old records reviewed in assessment of this patient ° °Antimicrobials: °Remdesivir 03/14/2020 >> current ° °DVT prophylaxis: Eliquis ° °Code Status:   Code Status: DNR ° °Disposition Plan: SNF °Status is: Obs ° °Objective: °Blood pressure 126/66, pulse 80, temperature 97.9 °F (36.6 °C), temperature source Oral, resp. rate 17, SpO2 99 %.  °Examination:  °Physical Exam °Constitutional:   °   General: He is not in acute distress. °   Comments: Fatigued appearing, NAD; follows commands  °HENT:  °   Head: Normocephalic and atraumatic.  °   Mouth/Throat:  °   Mouth: Mucous membranes are moist.  °Eyes:  °   Extraocular Movements: Extraocular movements intact.  °Cardiovascular:  °   Rate and Rhythm: Normal rate and regular rhythm.  °   Heart sounds: Normal heart sounds.  °Pulmonary:  °   Effort: Pulmonary effort is normal. No respiratory distress.  °   Breath sounds: Normal breath sounds. No wheezing.  °Abdominal:  °   General: Bowel sounds are normal. There is no distension.  °   Palpations: Abdomen is soft.  °     Tenderness: There is no abdominal tenderness.  °Musculoskeletal:     °   General: Normal range of motion.  °   Cervical back: Normal range of motion and neck supple.  °Skin: °   General: Skin is warm and dry.  °Neurological:  °   General: No focal deficit present.  °  ° °Consultants:  ° ° °Procedures:  ° ° °Data Reviewed: I have personally reviewed labs and imaging studies ° ° ° LOS: 0 days   °Time spent: Greater than 50% of the 35 minute visit was spent in counseling/coordination of care for the patient as laid out in the A&P.  ° ° , MD °[facility] °03/14/2021, 6:37 PM °

## 2021-03-14 NOTE — Assessment & Plan Note (Addendum)
-   positive covid test on 03/13/21 - No hypoxia on admission.  Does have multiple risk factors for progression to severe disease.  Agree with 3-day course of remdesivir (completing on 03/16/21) - okay to d/c to SNF with bed found and/or isolation period done

## 2021-03-14 NOTE — Assessment & Plan Note (Signed)
-   Deconditioned due to underlying COVID-19 infection - Follow-up PT eval: SNF recommended

## 2021-03-14 NOTE — Assessment & Plan Note (Addendum)
-  patient has history of CKD3a. Baseline creat ~ 1.3, eGFR 47 - patient presents with increase in creat >0.3 mg/dL above baseline, creat increase >1.5x baseline presumed to have occurred within past 7 days PTA - creat 1.75 on admission; presumed prerenal from poor intake -Creatinine improved and back to baseline  - encourage oral nutrition

## 2021-03-14 NOTE — Assessment & Plan Note (Addendum)
-   No chronic home meds - Continue delirium precautions

## 2021-03-15 DIAGNOSIS — U071 COVID-19: Secondary | ICD-10-CM | POA: Diagnosis present

## 2021-03-15 DIAGNOSIS — N400 Enlarged prostate without lower urinary tract symptoms: Secondary | ICD-10-CM | POA: Diagnosis present

## 2021-03-15 DIAGNOSIS — A0839 Other viral enteritis: Secondary | ICD-10-CM | POA: Diagnosis present

## 2021-03-15 DIAGNOSIS — M1990 Unspecified osteoarthritis, unspecified site: Secondary | ICD-10-CM | POA: Diagnosis present

## 2021-03-15 DIAGNOSIS — J9611 Chronic respiratory failure with hypoxia: Secondary | ICD-10-CM | POA: Diagnosis present

## 2021-03-15 DIAGNOSIS — F0390 Unspecified dementia without behavioral disturbance: Secondary | ICD-10-CM | POA: Diagnosis present

## 2021-03-15 DIAGNOSIS — R5381 Other malaise: Secondary | ICD-10-CM | POA: Diagnosis not present

## 2021-03-15 DIAGNOSIS — I2510 Atherosclerotic heart disease of native coronary artery without angina pectoris: Secondary | ICD-10-CM | POA: Diagnosis present

## 2021-03-15 DIAGNOSIS — H409 Unspecified glaucoma: Secondary | ICD-10-CM | POA: Diagnosis present

## 2021-03-15 DIAGNOSIS — R531 Weakness: Secondary | ICD-10-CM | POA: Diagnosis present

## 2021-03-15 DIAGNOSIS — E7800 Pure hypercholesterolemia, unspecified: Secondary | ICD-10-CM | POA: Diagnosis present

## 2021-03-15 DIAGNOSIS — Z823 Family history of stroke: Secondary | ICD-10-CM | POA: Diagnosis not present

## 2021-03-15 DIAGNOSIS — Z8249 Family history of ischemic heart disease and other diseases of the circulatory system: Secondary | ICD-10-CM | POA: Diagnosis not present

## 2021-03-15 DIAGNOSIS — I442 Atrioventricular block, complete: Secondary | ICD-10-CM | POA: Diagnosis present

## 2021-03-15 DIAGNOSIS — E8809 Other disorders of plasma-protein metabolism, not elsewhere classified: Secondary | ICD-10-CM | POA: Diagnosis present

## 2021-03-15 DIAGNOSIS — Z87891 Personal history of nicotine dependence: Secondary | ICD-10-CM | POA: Diagnosis not present

## 2021-03-15 DIAGNOSIS — I130 Hypertensive heart and chronic kidney disease with heart failure and stage 1 through stage 4 chronic kidney disease, or unspecified chronic kidney disease: Secondary | ICD-10-CM | POA: Diagnosis present

## 2021-03-15 DIAGNOSIS — Z803 Family history of malignant neoplasm of breast: Secondary | ICD-10-CM | POA: Diagnosis not present

## 2021-03-15 DIAGNOSIS — E1122 Type 2 diabetes mellitus with diabetic chronic kidney disease: Secondary | ICD-10-CM | POA: Diagnosis present

## 2021-03-15 DIAGNOSIS — N179 Acute kidney failure, unspecified: Secondary | ICD-10-CM | POA: Diagnosis present

## 2021-03-15 DIAGNOSIS — I4821 Permanent atrial fibrillation: Secondary | ICD-10-CM | POA: Diagnosis present

## 2021-03-15 DIAGNOSIS — I5042 Chronic combined systolic (congestive) and diastolic (congestive) heart failure: Secondary | ICD-10-CM | POA: Diagnosis present

## 2021-03-15 DIAGNOSIS — Z66 Do not resuscitate: Secondary | ICD-10-CM | POA: Diagnosis present

## 2021-03-15 DIAGNOSIS — E860 Dehydration: Secondary | ICD-10-CM | POA: Diagnosis present

## 2021-03-15 DIAGNOSIS — Z888 Allergy status to other drugs, medicaments and biological substances status: Secondary | ICD-10-CM | POA: Diagnosis not present

## 2021-03-15 DIAGNOSIS — N1831 Chronic kidney disease, stage 3a: Secondary | ICD-10-CM | POA: Diagnosis present

## 2021-03-15 LAB — GLUCOSE, CAPILLARY
Glucose-Capillary: 93 mg/dL (ref 70–99)
Glucose-Capillary: 126 mg/dL — ABNORMAL HIGH (ref 70–99)
Glucose-Capillary: 140 mg/dL — ABNORMAL HIGH (ref 70–99)
Glucose-Capillary: 165 mg/dL — ABNORMAL HIGH (ref 70–99)

## 2021-03-15 NOTE — Evaluation (Signed)
Occupational Therapy Evaluation Patient Details Name: Randall Odom MRN: [id number] DOB: 1926/10/10 Today's Date: 03/15/2021   History of Present Illness 88 y/o male admitted secondary to weakness and multiple falls. COVID-19 positive. PMH includes dementia, HTN, CHF, CAD, a fib, and s/p pacemaker.   Clinical Impression   PTA, pt was living with his wife, Randall Odom, who assisted him with ADLs; pt using RW at baseline.  Pt currently requiring Min A for UB ADLs, Mod-Max A for LB ADLs, and Mod A for functional transfers with RW. Pt presenting with decreased balance, strength, activity tolerance, and safety. Pt would benefit from further acute OT to facilitate safe dc. Recommend dc to SNF for further OT to optimize safety, independence with ADLs, and return to PLOF.      Recommendations for follow up therapy are one component of a multi-disciplinary discharge planning process, led by the attending physician.  Recommendations may be updated based on patient status, additional functional criteria and insurance authorization.   Follow Up Recommendations  Skilled nursing-short term rehab (<3 hours/day)    Assistance Recommended at Discharge Frequent or constant Supervision/Assistance  Patient can return home with the following A lot of help with walking and/or transfers;A lot of help with bathing/dressing/bathroom    Functional Status Assessment  Patient has had a recent decline in their functional status and demonstrates the ability to make significant improvements in function in a reasonable and predictable amount of time.  Equipment Recommendations  Other (comment) (Defer to next venue)    Recommendations for Other Services PT consult     Precautions / Restrictions Precautions Precautions: Fall      Mobility Bed Mobility Overal bed mobility: Needs Assistance Bed Mobility: Supine to Sit     Supine to sit: Min assist;HOB elevated     General bed mobility comments: Min A to bring BLEs  towards EOB. Pt then able to pull trunk into upright posture    Transfers Overall transfer level: Needs assistance Equipment used: Rolling walker (2 wheels) Transfers: Bed to chair/wheelchair/BSC   Stand pivot transfers: Mod assist         General transfer comment: Mod A for gaining balance and then maintaiing balance while pivoting to recliner. Poor safety awarneess and use of RW      Balance Overall balance assessment: Needs assistance Sitting-balance support: No upper extremity supported;Feet supported Sitting balance-Leahy Scale: Fair     Standing balance support: Bilateral upper extremity supported;During functional activity;Reliant on assistive device for balance Standing balance-Leahy Scale: Poor                             ADL either performed or assessed with clinical judgement   ADL Overall ADL's : Needs assistance/impaired Eating/Feeding: Set up;Sitting   Grooming: Set up;Supervision/safety;Sitting   Upper Body Bathing: Minimal assistance;Sitting   Lower Body Bathing: Moderate assistance;Sit to/from stand   Upper Body Dressing : Minimal assistance;Sitting   Lower Body Dressing: Moderate assistance;Sit to/from stand   Toilet Transfer: Moderate assistance;Stand-pivot;Rolling walker (2 wheels) (simualted to recliner)           Functional mobility during ADLs: Moderate assistance;Rolling walker (2 wheels) (stand pivot) General ADL Comments: Pt with decreased strength, balance, and acitivty toelrance     Vision         Perception     Praxis      Pertinent Vitals/Pain Pain Assessment: Faces Faces Pain Scale: No hurt Pain Intervention(s): Monitored during session  Hand Dominance Left   Extremity/Trunk Assessment Upper Extremity Assessment Upper Extremity Assessment: Generalized weakness   Lower Extremity Assessment Lower Extremity Assessment: Defer to PT evaluation   Cervical / Trunk Assessment Cervical / Trunk Assessment:  Kyphotic   Communication Communication Communication: HOH   Cognition Arousal/Alertness: Awake/alert Behavior During Therapy: WFL for tasks assessed/performed Overall Cognitive Status: History of cognitive impairments - at baseline                                 General Comments: Dementia at baseline. Perseverating on topics of his wife, prior jobs, and friends. Pleasely confused. oriented to the fact that he is in the hospital and aware his wife also has Rowan pt's wife so he could talk with her. She confirmed that she assists with ADLs. SpO2 90s on RA.    Exercises     Shoulder Instructions      Home Living Family/patient expects to be discharged to:: Private residence Living Arrangements: Spouse/significant other Available Help at Discharge: Family Type of Home: House Home Access: Stairs to enter Technical brewer of Steps: 2 Entrance Stairs-Rails: None Home Layout: One level     Bathroom Shower/Tub: Teacher, early years/pre: Standard     Home Equipment: Conservation officer, nature (2 wheels);Tub bench   Additional Comments: has been at [facility] for rehab      Prior Functioning/Environment Prior Level of Function : Needs assist       Physical Assist : ADLs (physical);Mobility (physical) Mobility (physical): Gait ADLs (physical): Bathing;Dressing Mobility Comments: Pt only ambulates short distances using RW. Reports he does not leave the house ADLs Comments: Per wife via the phone, she assists with ADLs including bathing and dressing.        OT Problem List: Decreased strength;Decreased range of motion;Decreased activity tolerance;Decreased safety awareness;Decreased knowledge of use of DME or AE;Decreased knowledge of precautions;Decreased cognition      OT Treatment/Interventions: Therapeutic exercise;Self-care/ADL training;Energy conservation;DME and/or AE instruction;Therapeutic activities;Patient/family  education    OT Goals(Current goals can be found in the care plan section) Acute Rehab OT Goals Patient Stated Goal: Go to rehab so he can return home with his wife, Randall Odom OT Goal Formulation: With patient Time For Goal Achievement: 03/29/21 Potential to Achieve Goals: Good  OT Frequency: Min 2X/week    Co-evaluation              AM-PAC OT "6 Clicks" Daily Activity     Outcome Measure Help from another person eating meals?: A Little Help from another person taking care of personal grooming?: A Little Help from another person toileting, which includes using toliet, bedpan, or urinal?: A Lot Help from another person bathing (including washing, rinsing, drying)?: A Lot Help from another person to put on and taking off regular upper body clothing?: A Little Help from another person to put on and taking off regular lower body clothing?: A Lot 6 Click Score: 15   End of Session Equipment Utilized During Treatment: Rolling walker (2 wheels) Nurse Communication: Mobility status  Activity Tolerance: Patient tolerated treatment well Patient left: in chair;with call bell/phone within reach;with chair alarm set  OT Visit Diagnosis: Other abnormalities of gait and mobility (R26.89);Unsteadiness on feet (R26.81);Muscle weakness (generalized) (M62.81)                Time: 1937-9024 OT Time Calculation (min): 38 min Charges:  OT General  Charges $OT Visit: 1 Visit OT Evaluation $OT Eval Moderate Complexity: 1 Mod OT Treatments $Self Care/Home Management : 23-37 mins  [place], OTR/L Acute Rehab Pager: [phone] Office: [place] 03/15/2021, 4:07 PM

## 2021-03-15 NOTE — Progress Notes (Signed)
Pt refused blood works Gaffer. Notified on call Jamison Neighbor.

## 2021-03-15 NOTE — Progress Notes (Addendum)
Mobility Specialist Progress Note:   03/15/21 1620  Mobility  Activity Transferred:  Chair to bed  Level of Assistance Moderate assist, patient does 50-74% (+2)  Assistive Device Front wheel walker  Distance Ambulated (ft) 2 ft  Mobility Response Tolerated well  Mobility performed by Mobility specialist;Nurse tech  $Mobility charge 1 Mobility   Pt required modA of 2 to stand from chair with verbal cues for hand placement. MinA required during pivot transfer to bed, pt impulsive when sitting down on EOB. Pt left sitting up in bed with bed alarm on.   Nelta Numbers Mobility Specialist  Phone [phone]

## 2021-03-15 NOTE — Progress Notes (Signed)
Progress Note    Randall Odom   [id number]  DOB: [birth year]  DOA: 03/13/2021     0 PCP: Prince Solian, MD  Initial CC: Fatigue  Hospital Course: Randall Odom is a 88 year old male with PMH afib, CAD, combined s/d CHF, CHB s/p pacer, DMII, DJD, diverticulosis, glaucoma, BPH, HTN, HLD who presented to the ER with worsening fatigue and falls.  He was described to be sleeping more at home and having had 4 mechanical falls prior to admission.  He also had developed a nonproductive cough with loose stools which the latter are described as chronic. On work-up in the ER he was found to be positive for COVID-19 and was admitted for treatment and further work-up.  Interval History:  No events overnight.  Resting in bed with son present bedside this morning.  Patient did not want to be stuck with needles and declined lab draws this morning.  I tried encouraging him to allow for tomorrow. Family is amenable with pursuing SNF placement when able.  Assessment & Plan: * COVID-19 virus infection - No hypoxia on admission.  Does have multiple risk factors for progression to severe disease.  Agree with 3-day course of remdesivir (completing on 03/16/21) - Continue trending inflammatory markers  Acute renal failure superimposed on stage 3a chronic kidney disease ([place])- (present on admission) - patient has history of CKD3a. Baseline creat ~ 1.3, eGFR 47 - patient presents with increase in creat >0.3 mg/dL above baseline, creat increase >1.5x baseline presumed to have occurred within past 7 days PTA - creat 1.75 on admission; presumed prerenal from poor intake - continue IVF and repeat BMP in am (patient declining lab draws but encouraged him to try and allow for tomorrow)  Permanent atrial fibrillation ([place])- (present on admission) - Continue Eliquis  Diarrhea- (present on admission) - Chronic and has been treated for C. difficile multiple times in the past - Repeat C. difficile testing ordered on  admission however no sample provided yet  Dementia ([place])- (present on admission) - No chronic home meds - Continue delirium precautions  Chronic combined systolic and diastolic heart failure ([place])- (present on admission) - No signs or symptoms of exacerbation or volume overload - Continue holding Lasix in setting of worsened renal function  Essential hypertension- (present on admission) - BP remains in normal range.  Continue holding home terazosin  Physical deconditioning- (present on admission) - Deconditioned due to underlying COVID-19 infection - Follow-up PT eval: SNF recommended  CAD (coronary artery disease)- (present on admission) - Continue Eliquis - Hold Lasix, statin, and lisinopril  Diabetes mellitus ([place]) - Continue SSI and CBG monitoring    Old records reviewed in assessment of this patient  Antimicrobials: Remdesivir 03/14/2020 >> current   DVT prophylaxis: Eliquis  Code Status:   Code Status: DNR  Disposition Plan: SNF Status is: Inpt  Objective: Blood pressure 131/62, pulse 71, temperature 97.7 F (36.5 C), temperature source Oral, resp. rate 17, height 5' 10"  (1.778 m), weight 91 kg, SpO2 97 %.  Examination:  Physical Exam Constitutional:      General: He is not in acute distress.    Comments: Fatigued appearing, NAD; follows commands  HENT:     Head: Normocephalic and atraumatic.     Mouth/Throat:     Mouth: Mucous membranes are moist.  Eyes:     Extraocular Movements: Extraocular movements intact.  Cardiovascular:     Rate and Rhythm: Normal rate and regular rhythm.     Heart sounds: Normal heart  sounds.  Pulmonary:     Effort: Pulmonary effort is normal. No respiratory distress.     Breath sounds: Normal breath sounds. No wheezing.  Abdominal:     General: Bowel sounds are normal. There is no distension.     Palpations: Abdomen is soft.     Tenderness: There is no abdominal tenderness.  Musculoskeletal:        General: Normal range of  motion.     Cervical back: Normal range of motion and neck supple.  Skin:    General: Skin is warm and dry.  Neurological:     General: No focal deficit present.     Consultants:    Procedures:    Data Reviewed: I have personally reviewed labs and imaging studies    LOS: 0 days  Time spent: Greater than 50% of the 35 minute visit was spent in counseling/coordination of care for the patient as laid out in the A&P.   Dwyane Dee, MD [facility] 03/15/2021, 5:44 PM

## 2021-03-16 LAB — COMPREHENSIVE METABOLIC PANEL
ALT: 23 U/L (ref 0–44)
AST: 42 U/L — ABNORMAL HIGH (ref 15–41)
Albumin: 2.6 g/dL — ABNORMAL LOW (ref 3.5–5.0)
Alkaline Phosphatase: 62 U/L (ref 38–126)
Anion gap: 9 (ref 5–15)
BUN: 20 mg/dL (ref 8–23)
CO2: 24 mmol/L (ref 22–32)
Calcium: 8.3 mg/dL — ABNORMAL LOW (ref 8.9–10.3)
Chloride: 104 mmol/L (ref 98–111)
Creatinine, Ser: 1.29 mg/dL — ABNORMAL HIGH (ref 0.61–1.24)
GFR, Estimated: 52 mL/min — ABNORMAL LOW (ref >60)
Glucose, Bld: 150 mg/dL — ABNORMAL HIGH (ref 70–99)
Potassium: 4.7 mmol/L (ref 3.5–5.1)
Sodium: 137 mmol/L (ref 135–145)
Total Bilirubin: 0.3 mg/dL (ref 0.3–1.2)
Total Protein: 5.4 g/dL — ABNORMAL LOW (ref 6.5–8.1)

## 2021-03-16 LAB — GLUCOSE, CAPILLARY
Glucose-Capillary: 137 mg/dL — ABNORMAL HIGH (ref 70–99)
Glucose-Capillary: 151 mg/dL — ABNORMAL HIGH (ref 70–99)
Glucose-Capillary: 181 mg/dL — ABNORMAL HIGH (ref 70–99)
Glucose-Capillary: 131 mg/dL — ABNORMAL HIGH (ref 70–99)

## 2021-03-16 LAB — LACTATE DEHYDROGENASE: LDH: 149 U/L (ref 98–192)

## 2021-03-16 LAB — MAGNESIUM: Magnesium: 1.6 mg/dL — ABNORMAL LOW (ref 1.7–2.4)

## 2021-03-16 LAB — C-REACTIVE PROTEIN: CRP: 2.1 mg/dL — ABNORMAL HIGH (ref <1.0)

## 2021-03-16 LAB — D-DIMER, QUANTITATIVE: D-Dimer, Quant: 0.78 ug/mL-FEU — ABNORMAL HIGH (ref 0.00–0.50)

## 2021-03-16 MED ORDER — MAGNESIUM SULFATE 2 GM/50ML IV SOLN
2.0000 g | Freq: Once | INTRAVENOUS | Status: AC
  Administered 2021-03-16: 2 g via INTRAVENOUS
  Filled 2021-03-16: qty 50

## 2021-03-16 NOTE — Progress Notes (Signed)
Randall Odom had an uneventful night. He did not require any PRN's this evening. He did not sleep very much. I convinced him to turn off the television and try to rest but upon reassessment, he had turned it back on and was still awake. He is pleasant and very hard of hearing (has hearing aids but does not wear them). It was a pleasure to care for Randall Odom this evening.

## 2021-03-16 NOTE — Progress Notes (Signed)
Progress Note    Randall Odom   KXF:[id number]  DOB: Jan 10, 1927  DOA: 03/13/2021     1 PCP: Randall Solian, MD  Initial CC: Fatigue  Hospital Course: Randall Odom is a 88 year old male with PMH afib, CAD, combined s/d CHF, CHB s/p pacer, DMII, DJD, diverticulosis, glaucoma, BPH, HTN, HLD who presented to the ER with worsening fatigue and falls.  He was described to be sleeping more at home and having had 4 mechanical falls prior to admission.  He also had developed a nonproductive cough with loose stools which the latter are described as chronic. On work-up in the ER he was found to be positive for COVID-19 and was admitted for treatment and further work-up.  Interval History:  No events overnight.  Patient awake and sitting up in bed this morning stating that he wants to go home.  Tried informing him that we are still treating him in the hospital but he became aggravated stating he just wants to go home.  After further reasoning with patient, he understands need for remaining in hospital but we did not discuss rehab so as to not worsen his aggravation.  Assessment & Plan: * COVID-19 virus infection - No hypoxia on admission.  Does have multiple risk factors for progression to severe disease.  Agree with 3-day course of remdesivir (completing on 03/16/21) - Continue trending inflammatory markers  Acute renal failure superimposed on stage 3a chronic kidney disease ([place])- (present on admission) - patient has history of CKD3a. Baseline creat ~ 1.3, eGFR 47 - patient presents with increase in creat >0.3 mg/dL above baseline, creat increase >1.5x baseline presumed to have occurred within past 7 days PTA - creat 1.75 on admission; presumed prerenal from poor intake -Creatinine improved and back to baseline today -Okay to discontinue fluids and encourage oral nutrition  Permanent atrial fibrillation ([place])- (present on admission) - Continue Eliquis  Diarrhea-resolved as of 03/16/2021, (present  on admission) - Chronic and has been treated for C. difficile multiple times in the past -Repeat C. difficile testing ordered on admission however patient did not have any further diarrhea after admission  Dementia ([place])- (present on admission) - No chronic home meds - Continue delirium precautions -Mild agitation this morning as patient already starting to request wanting to go home.  Chronic combined systolic and diastolic heart failure ([facility])- (present on admission) - No signs or symptoms of exacerbation or volume overload - Continue holding Lasix in setting of worsened renal function  Essential hypertension- (present on admission) - BP remains in normal range.  Continue holding home terazosin  Physical deconditioning- (present on admission) - Deconditioned due to underlying COVID-19 infection - Follow-up PT eval: SNF recommended  CAD (coronary artery disease)- (present on admission) - Continue Eliquis - Hold Lasix, statin, and lisinopril  Diabetes mellitus ([place]) - Continue SSI and CBG monitoring    Old records reviewed in assessment of this patient  Antimicrobials: Remdesivir 03/14/2020 >> 03/16/2021  DVT prophylaxis: Eliquis  Code Status:   Code Status: DNR  Disposition Plan: SNF Status is: Inpt  Objective: Blood pressure 136/69, pulse 77, temperature 98.7 F (37.1 C), temperature source Oral, resp. rate 18, height _0  (1.778 m), weight 91 kg, SpO2 98 %.  Examination:  Physical Exam Constitutional:      General: He is not in acute distress.    Comments: Awake and alert, no distress  HENT:     Head: Normocephalic and atraumatic.     Mouth/Throat:     Mouth:  Mucous membranes are moist.  Eyes:     Extraocular Movements: Extraocular movements intact.  Cardiovascular:     Rate and Rhythm: Normal rate and regular rhythm.     Heart sounds: Normal heart sounds.  Pulmonary:     Effort: Pulmonary effort is normal. No respiratory distress.     Breath sounds: Normal  breath sounds. No wheezing.  Abdominal:     General: Bowel sounds are normal. There is no distension.     Palpations: Abdomen is soft.     Tenderness: There is no abdominal tenderness.  Musculoskeletal:        General: Normal range of motion.     Cervical back: Normal range of motion and neck supple.  Skin:    General: Skin is warm and dry.  Neurological:     General: No focal deficit present.     Comments: Underlying dementia appreciated     Consultants:    Procedures:    Data Reviewed: I have personally reviewed labs and imaging studies    LOS: 1 day  Time spent: Greater than 50% of the 35 minute visit was spent in counseling/coordination of care for the patient as laid out in the A&P.   Dwyane Dee, MD [facility] 03/16/2021, 1:47 PM

## 2021-03-17 LAB — GLUCOSE, CAPILLARY
Glucose-Capillary: 146 mg/dL — ABNORMAL HIGH (ref 70–99)
Glucose-Capillary: 177 mg/dL — ABNORMAL HIGH (ref 70–99)
Glucose-Capillary: 200 mg/dL — ABNORMAL HIGH (ref 70–99)
Glucose-Capillary: 206 mg/dL — ABNORMAL HIGH (ref 70–99)

## 2021-03-17 NOTE — Plan of Care (Signed)
  Problem: Education: Goal: Knowledge of General Education information will improve Description Including pain rating scale, medication(s)/side effects and non-pharmacologic comfort measures Outcome: Progressing   Problem: Health Behavior/Discharge Planning: Goal: Ability to manage health-related needs will improve Outcome: Progressing   

## 2021-03-17 NOTE — Progress Notes (Signed)
Progress Note    Randall Odom   [id number]  DOB: Jul 06, 1926  DOA: 03/13/2021     2 PCP: Prince Solian, MD  Initial CC: Fatigue  Hospital Course: Randall Odom is a 88 year old male with PMH afib, CAD, combined s/d CHF, CHB s/p pacer, DMII, DJD, diverticulosis, glaucoma, BPH, HTN, HLD who presented to the ER with worsening fatigue and falls.  He was described to be sleeping more at home and having had 4 mechanical falls prior to admission.  He also had developed a nonproductive cough with loose stools which the latter are described as chronic. On work-up in the ER he was found to be positive for COVID-19 and was admitted for treatment and further work-up.  Interval History:  No events overnight.  Less aggravated today and was amenable with going to SNF at discharge.  He has talked to his son since yesterday he says.  Assessment & Plan: * COVID-19 virus infection - No hypoxia on admission.  Does have multiple risk factors for progression to severe disease.  Agree with 3-day course of remdesivir (completing on 03/16/21) - Continue trending inflammatory markers  Acute renal failure superimposed on stage 3a chronic kidney disease ([place])- (present on admission) - patient has history of CKD3a. Baseline creat ~ 1.3, eGFR 47 - patient presents with increase in creat >0.3 mg/dL above baseline, creat increase >1.5x baseline presumed to have occurred within past 7 days PTA - creat 1.75 on admission; presumed prerenal from poor intake -Creatinine improved and back to baseline today -Okay to discontinue fluids and encourage oral nutrition  Permanent atrial fibrillation ([place])- (present on admission) - Continue Eliquis  Diarrhea-resolved as of 03/16/2021, (present on admission) - Chronic and has been treated for C. difficile multiple times in the past -Repeat C. difficile testing ordered on admission however patient did not have any further diarrhea after admission  Dementia ([place])- (present on  admission) - No chronic home meds - Continue delirium precautions  Chronic combined systolic and diastolic heart failure ([place])- (present on admission) - No signs or symptoms of exacerbation or volume overload - Continue holding Lasix in setting of worsened renal function  Essential hypertension- (present on admission) - BP remains in normal range.  Continue holding home terazosin  Physical deconditioning- (present on admission) - Deconditioned due to underlying COVID-19 infection - Follow-up PT eval: SNF recommended  CAD (coronary artery disease)- (present on admission) - Continue Eliquis - Hold Lasix, statin, and lisinopril  Diabetes mellitus ([place]) - Continue SSI and CBG monitoring    Old records reviewed in assessment of this patient  Antimicrobials: Remdesivir 03/14/2020 >> 03/16/2021  DVT prophylaxis: Eliquis  Code Status:   Code Status: DNR  Disposition Plan: SNF Status is: Inpt  Objective: Blood pressure 129/71, pulse 74, temperature 98.2 F (36.8 C), temperature source Oral, resp. rate 19, height 5' 10"  (1.778 m), weight 91 kg, SpO2 98 %.  Examination:  Physical Exam Constitutional:      General: He is not in acute distress.    Comments: Awake and alert, no distress  HENT:     Head: Normocephalic and atraumatic.     Mouth/Throat:     Mouth: Mucous membranes are moist.  Eyes:     Extraocular Movements: Extraocular movements intact.  Cardiovascular:     Rate and Rhythm: Normal rate and regular rhythm.     Heart sounds: Normal heart sounds.  Pulmonary:     Effort: Pulmonary effort is normal. No respiratory distress.     Breath  sounds: Normal breath sounds. No wheezing.  Abdominal:     General: Bowel sounds are normal. There is no distension.     Palpations: Abdomen is soft.     Tenderness: There is no abdominal tenderness.  Musculoskeletal:        General: Normal range of motion.     Cervical back: Normal range of motion and neck supple.  Skin:     General: Skin is warm and dry.  Neurological:     General: No focal deficit present.     Comments: Underlying dementia appreciated     Consultants:    Procedures:    Data Reviewed: I have personally reviewed labs and imaging studies    LOS: 2 days  Time spent: Greater than 50% of the 35 minute visit was spent in counseling/coordination of care for the patient as laid out in the A&P.   Randall Dee, MD [facility] 03/17/2021, 2:57 PM

## 2021-03-18 LAB — CULTURE, BLOOD (ROUTINE X 2)
Culture: NO GROWTH
Culture: NO GROWTH
Special Requests: ADEQUATE

## 2021-03-18 LAB — GLUCOSE, CAPILLARY
Glucose-Capillary: 165 mg/dL — ABNORMAL HIGH (ref 70–99)
Glucose-Capillary: 238 mg/dL — ABNORMAL HIGH (ref 70–99)
Glucose-Capillary: 197 mg/dL — ABNORMAL HIGH (ref 70–99)
Glucose-Capillary: 198 mg/dL — ABNORMAL HIGH (ref 70–99)

## 2021-03-18 NOTE — Progress Notes (Signed)
Progress Note    Randall Odom   JOA:[id number]  DOB: Mar 31, 1926  DOA: 03/13/2021     3 PCP: Prince Solian, MD  Initial CC: Fatigue  Hospital Course: [name] is a 88 year old male with PMH afib, CAD, combined s/d CHF, CHB s/p pacer, DMII, DJD, diverticulosis, glaucoma, BPH, HTN, HLD who presented to the ER with worsening fatigue and falls.  He was described to be sleeping more at home and having had 4 mechanical falls prior to admission.  He also had developed a nonproductive cough with loose stools which the latter are described as chronic. On work-up in the ER he was found to be positive for COVID-19 and was admitted for treatment and further work-up.  Interval History:  No events overnight.  Still cooperative and pleasant this morning.  Hard of hearing but as long as speaking close to his ears, he can understand you well.  Assessment & Plan: * COVID-19 virus infection - No hypoxia on admission.  Does have multiple risk factors for progression to severe disease.  Agree with 3-day course of remdesivir (completing on 03/16/21) - Continue trending inflammatory markers  Acute renal failure superimposed on stage 3a chronic kidney disease ([place])- (present on admission) - patient has history of CKD3a. Baseline creat ~ 1.3, eGFR 47 - patient presents with increase in creat >0.3 mg/dL above baseline, creat increase >1.5x baseline presumed to have occurred within past 7 days PTA - creat 1.75 on admission; presumed prerenal from poor intake -Creatinine improved and back to baseline  - encourage oral nutrition  Permanent atrial fibrillation ([place])- (present on admission) - Continue Eliquis  Diarrhea-resolved as of 03/16/2021, (present on admission) - Chronic and has been treated for C. difficile multiple times in the past -Repeat C. difficile testing ordered on admission however patient did not have any further diarrhea after admission  Dementia ([place])- (present on admission) - No chronic  home meds - Continue delirium precautions  Chronic combined systolic and diastolic heart failure ([place])- (present on admission) - No signs or symptoms of exacerbation or volume overload - Continue holding Lasix in setting of worsened renal function  Essential hypertension- (present on admission) - BP remains in normal range.  Continue holding home terazosin  Physical deconditioning- (present on admission) - Deconditioned due to underlying COVID-19 infection - Follow-up PT eval: SNF recommended  CAD (coronary artery disease)- (present on admission) - Continue Eliquis - Hold Lasix, statin, and lisinopril  Diabetes mellitus ([place]) - Continue SSI and CBG monitoring    Old records reviewed in assessment of this patient  Antimicrobials: Remdesivir 03/14/2020 >> 03/16/2021  DVT prophylaxis: Eliquis  Code Status:   Code Status: DNR  Disposition Plan: SNF Status is: Inpt  Objective: Blood pressure 140/62, pulse 74, temperature (!) 100.5 F (38.1 C), temperature source Oral, resp. rate 19, height _0  (1.778 m), weight 91 kg, SpO2 100 %.  Examination:  Physical Exam Constitutional:      General: He is not in acute distress.    Comments: Awake and alert, no distress  HENT:     Head: Normocephalic and atraumatic.     Mouth/Throat:     Mouth: Mucous membranes are moist.  Eyes:     Extraocular Movements: Extraocular movements intact.  Cardiovascular:     Rate and Rhythm: Normal rate and regular rhythm.     Heart sounds: Normal heart sounds.  Pulmonary:     Effort: Pulmonary effort is normal. No respiratory distress.     Breath sounds: Normal  breath sounds. No wheezing.  Abdominal:     General: Bowel sounds are normal. There is no distension.     Palpations: Abdomen is soft.     Tenderness: There is no abdominal tenderness.  Musculoskeletal:        General: Normal range of motion.     Cervical back: Normal range of motion and neck supple.  Skin:    General: Skin is warm  and dry.  Neurological:     General: No focal deficit present.     Comments: Underlying dementia appreciated     Consultants:    Procedures:    Data Reviewed: I have personally reviewed labs and imaging studies    LOS: 3 days  Time spent: Greater than 50% of the 35 minute visit was spent in counseling/coordination of care for the patient as laid out in the A&P.   Dwyane Dee, MD [facility] 03/18/2021, 1:46 PM

## 2021-03-19 LAB — GLUCOSE, CAPILLARY
Glucose-Capillary: 162 mg/dL — ABNORMAL HIGH (ref 70–99)
Glucose-Capillary: 156 mg/dL — ABNORMAL HIGH (ref 70–99)
Glucose-Capillary: 232 mg/dL — ABNORMAL HIGH (ref 70–99)
Glucose-Capillary: 114 mg/dL — ABNORMAL HIGH (ref 70–99)

## 2021-03-19 NOTE — Progress Notes (Signed)
Progress Note    Randall Odom   YQI:[id number]  DOB: 1926/06/04  DOA: 03/13/2021     4 PCP: Randall Solian, MD  Initial CC: Fatigue  Hospital Course: Randall Odom is a 88 year old male with PMH afib, CAD, combined s/d CHF, CHB s/p pacer, DMII, DJD, diverticulosis, glaucoma, BPH, HTN, HLD who presented to the ER with worsening fatigue and falls.  He was described to be sleeping more at home and having had 4 mechanical falls prior to admission.  He also had developed a nonproductive cough with loose stools which the latter are described as chronic. On work-up in the ER he was found to be positive for COVID-19 and was admitted for treatment and further work-up.  Interval History:  No events overnight.  Watching TV this morning.  Understands we are still waiting for rehab.  Assessment & Plan: * COVID-19 virus infection - positive covid test on 03/13/21 - No hypoxia on admission.  Does have multiple risk factors for progression to severe disease.  Agree with 3-day course of remdesivir (completing on 03/16/21) - okay to d/c to SNF with bed found and/or isolation period done   Acute renal failure superimposed on stage 3a chronic kidney disease ([place])- (present on admission) - patient has history of CKD3a. Baseline creat ~ 1.3, eGFR 47 - patient presents with increase in creat >0.3 mg/dL above baseline, creat increase >1.5x baseline presumed to have occurred within past 7 days PTA - creat 1.75 on admission; presumed prerenal from poor intake -Creatinine improved and back to baseline  - encourage oral nutrition  Permanent atrial fibrillation ([place])- (present on admission) - Continue Eliquis  Diarrhea-resolved as of 03/16/2021, (present on admission) - Chronic and has been treated for C. difficile multiple times in the past -Repeat C. difficile testing ordered on admission however patient did not have any further diarrhea after admission  Dementia ([place])- (present on admission) - No chronic home  meds - Continue delirium precautions  Chronic combined systolic and diastolic heart failure ([place])- (present on admission) - No signs or symptoms of exacerbation or volume overload - Continue holding Lasix in setting of worsened renal function  Essential hypertension- (present on admission) - BP remains in normal range.  Continue holding home terazosin  Physical deconditioning- (present on admission) - Deconditioned due to underlying COVID-19 infection - Follow-up PT eval: SNF recommended  CAD (coronary artery disease)- (present on admission) - Continue Eliquis - Hold Lasix, statin, and lisinopril  Diabetes mellitus ([place]) - Continue SSI and CBG monitoring    Old records reviewed in assessment of this patient  Antimicrobials: Remdesivir 03/14/2020 >> 03/16/2021  DVT prophylaxis: Eliquis  Code Status:   Code Status: DNR  Disposition Plan: SNF; he is medically stable when bed found and approved to go Status is: Inpt  Objective: Blood pressure 138/66, pulse 74, temperature 98.1 F (36.7 C), temperature source Oral, resp. rate 16, height 5' 10"  (1.778 m), weight 91 kg, SpO2 97 %.  Examination:  Physical Exam Constitutional:      General: He is not in acute distress.    Comments: Awake and alert, no distress  HENT:     Head: Normocephalic and atraumatic.     Mouth/Throat:     Mouth: Mucous membranes are moist.  Eyes:     Extraocular Movements: Extraocular movements intact.  Cardiovascular:     Rate and Rhythm: Normal rate and regular rhythm.     Heart sounds: Normal heart sounds.  Pulmonary:     Effort: Pulmonary  effort is normal. No respiratory distress.     Breath sounds: Normal breath sounds. No wheezing.  Abdominal:     General: Bowel sounds are normal. There is no distension.     Palpations: Abdomen is soft.     Tenderness: There is no abdominal tenderness.  Musculoskeletal:        General: Normal range of motion.     Cervical back: Normal range of motion and  neck supple.  Skin:    General: Skin is warm and dry.  Neurological:     General: No focal deficit present.     Comments: Underlying dementia appreciated     Consultants:    Procedures:    Data Reviewed: I have personally reviewed labs and imaging studies    LOS: 4 days  Time spent: Greater than 50% of the 35 minute visit was spent in counseling/coordination of care for the patient as laid out in the A&P.   Dwyane Dee, MD [facility] 03/19/2021, 2:22 PM

## 2021-03-19 NOTE — NC FL2 (Signed)
[place] MEDICAID FL2 LEVEL OF CARE SCREENING TOOL     IDENTIFICATION  Patient Name: Randall Odom Birthdate: 25-Dec-1926 Sex: male Admission Date (Current Location): 03/13/2021  [facility] and [place] Number:  Herbalist and Address:  [facility], [place] [address], [place], [place] [address]      Provider Number: [id number]  Attending Physician Name and Address:  Dwyane Dee, MD  Relative Name and Phone Number:  Khalel, Alms [phone]    Current Level of Care: Hospital Recommended Level of Care: [place] Prior Approval Number:    Date Approved/Denied:   PASRR Number: [id number]  Discharge Plan: SNF    Current Diagnoses: Patient Active Problem List   Diagnosis Date Noted   Positive D dimer 03/14/2021   COVID-19 virus infection    Multiple falls 03/13/2021   Recurrent Clostridium difficile diarrhea 08/14/2020   Chronic respiratory failure with hypoxia ([place]) 09/11/2016   Dementia ([place]) 09/11/2016   Acute renal failure superimposed on stage 3a chronic kidney disease ([place]) 09/11/2016   Sepsis ([place]) 09/11/2016   Leukocytosis 09/11/2016   Hematuria 09/11/2016   Hyponatremia 09/11/2016   Thrush 09/11/2016   Community acquired pneumonia    Pulmonary infiltrate 08/30/2016   Acute respiratory failure with hypoxemia ([place]) 08/29/2016   Abnormal CT of the chest 08/29/2016   Acute and chronic respiratory failure with hypoxia ([place]) 08/29/2016   Chronic anticoagulation 07/10/2016   Abnormal CT scan, chest 08/13/2013   Hyperlipidemia 08/09/2013   Essential hypertension 08/09/2013   Dyspnea on exertion 08/09/2013   Chronic combined systolic and diastolic heart failure ([place]) 08/09/2013   Permanent atrial fibrillation ([place]) 02/22/2013   Complete heart block ([place]) 02/22/2013   Occlusion and stenosis of carotid artery without mention of cerebral infarction 06/02/2012   Physical deconditioning 05/18/2012   C. difficile colitis  05/13/2012   Dehydration 05/13/2012   Acute renal failure ([facility]) 05/12/2012   Hypokalemia 05/12/2012   Syncope and collapse 05/06/2011   Diabetes mellitus ([place]) 05/06/2011   Pacemaker 05/06/2011   CAD (coronary artery disease) 05/06/2011    Orientation RESPIRATION BLADDER Height & Weight     Self, Place  Normal Incontinent, External catheter Weight: 200 lb 9.9 oz (91 kg) Height:  5\' 10"  (177.8 cm)  BEHAVIORAL SYMPTOMS/MOOD NEUROLOGICAL BOWEL NUTRITION STATUS      Continent Diet (See DC summary)  AMBULATORY STATUS COMMUNICATION OF NEEDS Skin   Extensive Assist Verbally Skin abrasions (R arm Ecchymosis)                       Personal Care Assistance Level of Assistance  Bathing, Feeding, Dressing Bathing Assistance: Maximum assistance Feeding assistance: Limited assistance Dressing Assistance: Maximum assistance     Functional Limitations Info  Sight, Hearing, Speech Sight Info: Adequate Hearing Info: Impaired Speech Info: Adequate    SPECIAL CARE FACTORS FREQUENCY  PT (By licensed PT), OT (By licensed OT)     PT Frequency: 5x week OT Frequency: 5x week            Contractures Contractures Info: Not present    Additional Factors Info  Code Status, Allergies Code Status Info: DNR Allergies Info: Spironolactone   Ciprofloxacin           Current Medications (03/19/2021):  This is the current hospital active medication list Current Facility-Administered Medications  Medication Dose Route Frequency Provider Last Rate Last Admin   acetaminophen (TYLENOL) tablet 650 mg  650 mg Oral Q6H PRN  Vianne Bulls, MD   650 mg at 03/18/21 1946   apixaban (ELIQUIS) tablet 5 mg  5 mg Oral BID Opyd, Ilene Qua, MD   5 mg at 03/19/21 0851   insulin aspart (novoLOG) injection 0-5 Units  0-5 Units Subcutaneous QHS Vianne Bulls, MD   2 Units at 03/17/21 2106   insulin aspart (novoLOG) injection 0-6 Units  0-6 Units Subcutaneous TID WC Opyd, Ilene Qua, MD   1 Units at 03/19/21  0851   ondansetron (ZOFRAN) tablet 4 mg  4 mg Oral Q6H PRN Opyd, Ilene Qua, MD       Or   ondansetron (ZOFRAN) injection 4 mg  4 mg Intravenous Q6H PRN Opyd, Ilene Qua, MD         Discharge Medications: Please see discharge summary for a list of discharge medications.  Relevant Imaging Results:  Relevant Lab Results:   Additional Information SS# [id number]  Coralee [place], [place]

## 2021-03-19 NOTE — Progress Notes (Signed)
PT Cancellation Note  Patient Details Name: Randall Odom MRN: [id number] DOB: March 02, 1927   Cancelled Treatment:    Reason Eval/Treat Not Completed: Patient declined, no reason specified. Patient refusing all PT despite multiple attempts to encourage. He states "I told you, I just don't feel well." Will return at later date/time to re-attempt.    Kailoni Vahle 03/19/2021, 12:10 PM

## 2021-03-20 LAB — GLUCOSE, CAPILLARY
Glucose-Capillary: 142 mg/dL — ABNORMAL HIGH (ref 70–99)
Glucose-Capillary: 213 mg/dL — ABNORMAL HIGH (ref 70–99)
Glucose-Capillary: 157 mg/dL — ABNORMAL HIGH (ref 70–99)
Glucose-Capillary: 130 mg/dL — ABNORMAL HIGH (ref 70–99)

## 2021-03-20 NOTE — Progress Notes (Signed)
Physical Therapy Treatment Patient Details Name: Randall Odom MRN: [id number] DOB: 03-Oct-1926 Today's Date: 03/20/2021   History of Present Illness 88 y/o male admitted secondary to weakness and multiple falls. COVID-19 positive. PMH includes dementia, HTN, CHF, CAD, a fib, and s/p pacemaker.    PT Comments    Pt admitted to being depressed and perseverated on how great his wife is and playing baseball. Pt easily distracted today and was able to complete transfer to EOB and OOB to chair with maxAx2. Pt with noted productive cough. Pt appreciative of  PT/OT services and the company. Acute PT to cont to follow. Continue to recommend SNF upon d/c as wife unable to provide maxAx2. Pt was having freq falls PTA as well.    Recommendations for follow up therapy are one component of a multi-disciplinary discharge planning process, led by the attending physician.  Recommendations may be updated based on patient status, additional functional criteria and insurance authorization.  Follow Up Recommendations  Skilled nursing-short term rehab (<3 hours/day)     Assistance Recommended at Discharge Frequent or constant Supervision/Assistance  Patient can return home with the following A lot of help with walking and/or transfers;A lot of help with bathing/dressing/bathroom;Help with stairs or ramp for entrance;Direct supervision/assist for financial management;Direct supervision/assist for medications management;Assist for transportation;Assistance with cooking/housework   Equipment Recommendations  Wheelchair (measurements PT);Wheelchair cushion (measurements PT)    Recommendations for Other Services       Precautions / Restrictions Precautions Precautions: Fall Restrictions Weight Bearing Restrictions: No     Mobility  Bed Mobility Overal bed mobility: Needs Assistance Bed Mobility: Supine to Sit     Supine to sit: Max assist;+2 for physical assistance     General bed mobility comments:  max A to initiate LEs to EOB and used pads underneath pt to roate hips to sit to EOB, maxA for trunk elevation, max encouragement provided as pt states "I just don't feel good"    Transfers Overall transfer level: Needs assistance Equipment used: 2 person hand held assist Transfers: Bed to chair/wheelchair/BSC   Stand pivot transfers: Max assist;+2 physical assistance         General transfer comment: Pt able to scoot self to EOB in prep for standing, multimodal cues for intiating standing with pt +2 HHA, 2 attempts, tactile cues for weight shifting to advance LEs towards chair    Ambulation/Gait               General Gait Details: unable this date   Stairs             Wheelchair Mobility    Modified Rankin (Stroke Patients Only)       Balance Overall balance assessment: Needs assistance Sitting-balance support: No upper extremity supported;Feet supported Sitting balance-Leahy Scale: Fair     Standing balance support: Bilateral upper extremity supported;During functional activity Standing balance-Leahy Scale: Poor                              Cognition Arousal/Alertness: Awake/alert Behavior During Therapy: WFL for tasks assessed/performed Overall Cognitive Status: History of cognitive impairments - at baseline                                 General Comments: Dementia at baseline. Perseverating on topics of his wife, prior jobs, playing baseball. Pleasantly confused and jovial  Exercises      General Comments General comments (skin integrity, edema, etc.): VSS, noted productive cough      Pertinent Vitals/Pain Pain Assessment: Faces Faces Pain Scale: Hurts a little bit Breathing: normal Facial Expression: smiling or inexpressive Pain Location: R LE Pain Descriptors / Indicators: Moaning (with movement) Pain Intervention(s): Monitored during session    Home Living                          Prior  Function            PT Goals (current goals can now be found in the care plan section) Acute Rehab PT Goals Patient Stated Goal: to see his wife PT Goal Formulation: With patient Time For Goal Achievement: 03/28/21 Potential to Achieve Goals: Good Progress towards PT goals: Progressing toward goals    Frequency    Min 2X/week      PT Plan Current plan remains appropriate    Co-evaluation PT/OT/SLP Co-Evaluation/Treatment: Yes Reason for Co-Treatment: To address functional/ADL transfers PT goals addressed during session: Mobility/safety with mobility        AM-PAC PT "6 Clicks" Mobility   Outcome Measure  Help needed turning from your back to your side while in a flat bed without using bedrails?: A Little Help needed moving from lying on your back to sitting on the side of a flat bed without using bedrails?: A Lot Help needed moving to and from a bed to a chair (including a wheelchair)?: A Lot Help needed standing up from a chair using your arms (e.g., wheelchair or bedside chair)?: A Lot Help needed to walk in hospital room?: A Lot Help needed climbing 3-5 steps with a railing? : Total 6 Click Score: 12    End of Session Equipment Utilized During Treatment: Gait belt Activity Tolerance: Patient tolerated treatment well Patient left: with call bell/phone within reach;in chair;with chair alarm set Nurse Communication: Mobility status;Need for lift equipment (use steady to transfer pt back to bed) PT Visit Diagnosis: Unsteadiness on feet (R26.81);Muscle weakness (generalized) (M62.81);History of falling (Z91.81);Repeated falls (R29.6)     Time: 2119-4174 PT Time Calculation (min) (ACUTE ONLY): 29 min  Charges:  $Therapeutic Activity: 8-22 mins                     Kittie Plater, PT, DPT Acute Rehabilitation Services Pager #: [phone] Office #: [phone]    Berline Lopes 03/20/2021, 3:44 PM

## 2021-03-20 NOTE — Progress Notes (Signed)
Occupational Therapy Treatment Patient Details Name: Randall Odom MRN: [id number] DOB: 30-Apr-1926 Today's Date: 03/20/2021   History of present illness 88 y/o male admitted secondary to weakness and multiple falls. COVID-19 positive. PMH includes dementia, HTN, CHF, CAD, a fib, and s/p pacemaker.   OT comments  Pt making progress with functional goals. Pt pleasantly confused, jovial and agreeable to getting OOB to chair. Pt required max A +2 for sup - sit, max A +2 for SPT HHA to recliner. Once in recliner pt participated I grooming tasks with set up and sup with min A for R UE ROM to initiate  brushing teeth. OT will continue to follow acutely to maximize level of function and safety   Recommendations for follow up therapy are one component of a multi-disciplinary discharge planning process, led by the attending physician.  Recommendations may be updated based on patient status, additional functional criteria and insurance authorization.    Follow Up Recommendations  Skilled nursing-short term rehab (<3 hours/day)    Assistance Recommended at Discharge Frequent or constant Supervision/Assistance  Patient can return home with the following  A lot of help with walking and/or transfers;A lot of help with bathing/dressing/bathroom;Direct supervision/assist for medications management;Direct supervision/assist for financial management;Assist for transportation   Equipment Recommendations  Other (comment) (TBD at SNF)    Recommendations for Other Services      Precautions / Restrictions Precautions Precautions: Fall Restrictions Weight Bearing Restrictions: No       Mobility Bed Mobility Overal bed mobility: Needs Assistance Bed Mobility: Supine to Sit     Supine to sit: Max assist;+2 for physical assistance     General bed mobility comments: max A to initiate LEs to EOB and used pads underneath pt to roate hips to sit to EOB    Transfers Overall transfer level: Needs  assistance Equipment used: 2 person hand held assist Transfers: Bed to chair/wheelchair/BSC   Stand pivot transfers: Max assist;+2 physical assistance         General transfer comment: Pt able to scoot self to EOB in prep for standing, multimodal cues for intiating standing with pt +2 HHA     Balance Overall balance assessment: Needs assistance Sitting-balance support: No upper extremity supported;Feet supported Sitting balance-Leahy Scale: Fair     Standing balance support: Bilateral upper extremity supported;During functional activity Standing balance-Leahy Scale: Poor                             ADL either performed or assessed with clinical judgement   ADL Overall ADL's : Needs assistance/impaired Eating/Feeding: Set up;Sitting   Grooming: Wash/dry hands;Wash/dry face;Oral care;Set up;Supervision/safety;Sitting           Upper Body Dressing : Minimal assistance;Sitting       Toilet Transfer: Maximal assistance;+2 for physical assistance;Cueing for safety;Cueing for sequencing;Stand-pivot Toilet Transfer Details (indicate cue type and reason): simulated to recliner, +2 HHA Toileting- Clothing Manipulation and Hygiene: Total assistance       Functional mobility during ADLs: Maximal assistance;+2 for physical assistance;Cueing for safety;Cueing for sequencing General ADL Comments: Pt with decreased strength, balance, and acitivty toelrance    Extremity/Trunk Assessment Upper Extremity Assessment Upper Extremity Assessment: Generalized weakness   Lower Extremity Assessment Lower Extremity Assessment: Defer to PT evaluation   Cervical / Trunk Assessment Cervical / Trunk Assessment: Kyphotic    Vision Baseline Vision/History: 1 Wears glasses Ability to See in Adequate Light: 0 Adequate Patient Visual Report: No change  from baseline     Perception     Praxis      Cognition Arousal/Alertness: Awake/alert Behavior During Therapy: WFL for tasks  assessed/performed Overall Cognitive Status: History of cognitive impairments - at baseline                                 General Comments: Dementia at baseline. Perseverating on topics of his wife, prior jobs, playing baseball. Pleasely confused and jovial          Exercises     Shoulder Instructions       General Comments      Pertinent Vitals/ Pain       Pain Assessment: Faces Faces Pain Scale: Hurts a little bit Breathing: normal Facial Expression: smiling or inexpressive Pain Location: R LE Pain Descriptors / Indicators: Grimacing;Guarding;Moaning Pain Intervention(s): Monitored during session;Repositioned  Home Living                                          Prior Functioning/Environment              Frequency  Min 2X/week        Progress Toward Goals  OT Goals(current goals can now be found in the care plan section)  Progress towards OT goals: Progressing toward goals     Plan Discharge plan remains appropriate;Frequency remains appropriate    Co-evaluation                 AM-PAC OT "6 Clicks" Daily Activity     Outcome Measure   Help from another person eating meals?: None Help from another person taking care of personal grooming?: A Little Help from another person toileting, which includes using toliet, bedpan, or urinal?: A Lot Help from another person bathing (including washing, rinsing, drying)?: A Lot Help from another person to put on and taking off regular upper body clothing?: A Little Help from another person to put on and taking off regular lower body clothing?: A Lot 6 Click Score: 16    End of Session Equipment Utilized During Treatment: Gait belt  OT Visit Diagnosis: Other abnormalities of gait and mobility (R26.89);Unsteadiness on feet (R26.81);Muscle weakness (generalized) (M62.81)   Activity Tolerance Patient tolerated treatment well   Patient Left in chair;with call bell/phone  within reach;with chair alarm set   Nurse Communication          Time: 5366-4403 OT Time Calculation (min): 28 min  Charges: OT General Charges $OT Visit: 1 Visit OT Treatments $Self Care/Home Management : 8-22 mins   Britt Bottom 03/20/2021, 3:17 PM

## 2021-03-20 NOTE — Progress Notes (Signed)
PROGRESS NOTE    Randall Odom  BOF:[id number] DOB: Jan 25, 1927 DOA: 03/13/2021 PCP: Prince Solian, MD   Chief Complaint  Patient presents with   Fall  Brief Narrative/Hospital Course: Randall Odom, 88 y.o. male with PMH of afib, CAD, combined s/d CHF, CHB s/p pacer, DMII, DJD, diverticulosis, glaucoma, BPH, HTN, HLD who presented to the ER with worsening fatigue and falls.  He was described to be sleeping more at home and having had 4 mechanical falls prior to admission.  He also had developed a nonproductive cough with loose stools which the latter are described as chronic. On work-up in the ER he was found to be positive for COVID-19 and was admitted for treatment and further work-up.  Patient completed 3 days of remdesivir, was hydrated for AKI, at this time remains weak deconditioned with very skilled nursing facility  Subjective: Seen and examined this morning.  He is alert awake oriented to self current place current president Reports he wants to talk to his wife.  Assessment & Plan:  COVID-19 virus infection: Test is positive on 1/3 completed 3 days of remdesivir patient not hypoxic.  High risk of severe disease.  At this time doing well.  Physical deconditioning/Debility continue PT OT awaiting for skilled nursing facility.  AKI on CKD stage IIIa Baseline creatinine around 1.3, presented with 1.7 creatinine has returned to baseline continue to encourage oral hydration. Recent Labs  Lab 03/13/21 1919 03/16/21 0526  BUN 18 20  CREATININE 1.75* 1.29*   Permanent atrial fibrillation continues Eliquis. CAD-continue Eliquis holding Lasix and started on lisinopril for now Essential hypertension blood pressure remained stable terazosin on hold  Diarrhea chronic and has been treated for C. difficile multiple times in the past, no diarrhea currently.  Dementia not on chronic meds, mood fairly stable and pleasant and interactive.  Chronic combined systolic and diastolic CHF  volume status is stable Lasix remains on hold due to AKI resume upon discharge  Hypomagnesemia-add mag ox po Hypoalbuminemia-encourage oral intake Diabetes mellitus  stable on sliding scale. Recent Labs  Lab 03/19/21 1127 03/19/21 1613 03/19/21 2038 03/20/21 0820 03/20/21 1157  GLUCAP 156* 232* 114* 142* 213*   Goals of care/CODE STATUS currently DNR.   DVT prophylaxis:   Eliquis Code Status:   Code Status: DNR Family Communication: plan of care discussed with patient at bedside.  Status is: Inpatient Remains inpatient appropriate because: Ongoing management of deconditioning and care, pending placement Disposition: Currently is medically stable for discharge. Anticipated Disposition: snf  Total time spent in the care of this patient 25 MINUTES Objective: Vitals last 24 hrs: Vitals:   03/19/21 1616 03/19/21 2045 03/20/21 0419 03/20/21 0824  BP: 118/61 116/80 130/76 128/87  Pulse: 73 77 74 74  Resp: 18 16  18   Temp: 99 F (37.2 C) 98.9 F (37.2 C) 98.1 F (36.7 C) 98.4 F (36.9 C)  TempSrc: Oral  Oral Oral  SpO2: 96% 96% 99% 98%  Weight:      Height:       Weight change:   Intake/Output Summary (Last 24 hours) at 03/20/2021 1235 Last data filed at 03/20/2021 0932 Gross per 24 hour  Intake 100 ml  Output 650 ml  Net -550 ml   Net IO Since Admission: -628.49 mL [03/20/21 1235]   Physical Examination: General exam: Aa0x2-3, weak,older than stated age. HEENT:Oral mucosa moist, Ear/Nose WNL grossly,dentition normal. Respiratory system: B/l clearBS, no use of accessory muscle, non tender. Cardiovascular system: S1 & S2 +,No JVD.  Gastrointestinal system: Abdomen soft, NT,ND, BS+. Nervous System:Alert, awake, moving extremities. Extremities: edema none, distal peripheral pulses palpable.  Skin: No rashes, no icterus. MSK: Normal muscle bulk, tone, power.  Medications reviewed:  Scheduled Meds:  apixaban  5 mg Oral BID   insulin aspart  0-5 Units Subcutaneous  QHS   insulin aspart  0-6 Units Subcutaneous TID WC   Continuous Infusions:  Diet Order             Diet heart healthy/carb modified Room service appropriate? Yes; Fluid consistency: Thin  Diet effective now                          Weight change:   Wt Readings from Last 3 Encounters:  03/14/21 91 kg  04/05/19 96.2 kg  03/24/19 98.9 kg     Consultants:see note  Procedures:see note Antimicrobials: Anti-infectives (From admission, onward)    Start     Dose/Rate Route Frequency Ordered Stop   03/15/21 1000  remdesivir 100 mg in sodium chloride 0.9 % 100 mL IVPB       See Hyperspace for full Linked Orders Report.   100 mg 200 mL/hr over 30 Minutes Intravenous Daily 03/14/21 0143 03/16/21 1019   03/14/21 0300  remdesivir 200 mg in sodium chloride 0.9% 250 mL IVPB       See Hyperspace for full Linked Orders Report.   200 mg 580 mL/hr over 30 Minutes Intravenous Once 03/14/21 0143 03/14/21 0527      Culture/Microbiology    Component Value Date/Time   SDES BLOOD LEFT HAND 03/13/2021 2255   SPECREQUEST  03/13/2021 2255    BOTTLES DRAWN AEROBIC AND ANAEROBIC Blood Culture results may not be optimal due to an inadequate volume of blood received in culture bottles   CULT  03/13/2021 2255    NO GROWTH 5 DAYS Performed at [facility], [place] [address], [place], [place] [address]    REPTSTATUS 03/18/2021 FINAL 03/13/2021 2255    Other culture-see note  Unresulted Labs (From admission, onward)    None     Data Reviewed: I have personally reviewed following labs and imaging studies CBC: Recent Labs  Lab 03/13/21 1919  WBC 14.6*  HGB 12.6*  HCT 40.0  MCV 98.8  PLT 355*   Basic Metabolic Panel: Recent Labs  Lab 03/13/21 1919 03/16/21 0526  NA 135 137  K 4.3 4.7  CL 101 104  CO2 21* 24  GLUCOSE 126* 150*  BUN 18 20  CREATININE 1.75* 1.29*  CALCIUM 9.3 8.3*  MG  --  1.6*   GFR: Estimated Creatinine Clearance: 41.4 mL/min (A) (by C-G formula  based on SCr of 1.29 mg/dL (H)). Liver Function Tests: Recent Labs  Lab 03/13/21 1956 03/16/21 0526  AST 67* 42*  ALT 30 23  ALKPHOS 73 62  BILITOT 0.6 0.3  PROT 6.8 5.4*  ALBUMIN 3.2* 2.6*   Recent Labs  Lab 03/13/21 1956  LIPASE 30   No results for input(s): AMMONIA in the last 168 hours. Coagulation Profile: Recent Labs  Lab 03/13/21 1956  INR 1.6*   Cardiac Enzymes: No results for input(s): CKTOTAL, CKMB, CKMBINDEX, TROPONINI in the last 168 hours. BNP (last 3 results) No results for input(s): PROBNP in the last 8760 hours. HbA1C: No results for input(s): HGBA1C in the last 72 hours. CBG: Recent Labs  Lab 03/19/21 1127 03/19/21 1613 03/19/21 2038 03/20/21 0820 03/20/21 1157  GLUCAP 156* 232* 114* 142*  213*   Lipid Profile: No results for input(s): CHOL, HDL, LDLCALC, TRIG, CHOLHDL, LDLDIRECT in the last 72 hours. Thyroid Function Tests: No results for input(s): TSH, T4TOTAL, FREET4, T3FREE, THYROIDAB in the last 72 hours. Anemia Panel: No results for input(s): VITAMINB12, FOLATE, FERRITIN, TIBC, IRON, RETICCTPCT in the last 72 hours. Sepsis Labs: Recent Labs  Lab 03/13/21 2206 03/13/21 2235  PROCALCITON 0.17  --   LATICACIDVEN  --  1.8    Recent Results (from the past 240 hour(s))  Resp Panel by RT-PCR (Flu A&B, Covid) Nasopharyngeal Swab     Status: Abnormal   Collection Time: 03/13/21  7:46 PM   Specimen: Nasopharyngeal Swab; Nasopharyngeal(NP) swabs in vial transport medium  Result Value Ref Range Status   SARS Coronavirus 2 by RT PCR POSITIVE (A) NEGATIVE Final    Comment: (NOTE) SARS-CoV-2 target nucleic acids are DETECTED.  The SARS-CoV-2 RNA is generally detectable in upper respiratory specimens during the acute phase of infection. Positive results are indicative of the presence of the identified virus, but do not rule out bacterial infection or co-infection with other pathogens not detected by the test. Clinical correlation with patient  history and other diagnostic information is necessary to determine patient infection status. The expected result is Negative.  Fact Sheet for Patients: EntrepreneurPulse.com.au  Fact Sheet for Healthcare Providers: IncredibleEmployment.be  This test is not yet approved or cleared by the [place] FDA and  has been authorized for detection and/or diagnosis of SARS-CoV-2 by FDA under an Emergency Use Authorization (EUA).  This EUA will remain in effect (meaning this test can be used) for the duration of  the COVID-19 declaration under Section 564(b)(1) of the A ct, 21 U.S.C. section 360bbb-3(b)(1), unless the authorization is terminated or revoked sooner.     Influenza A by PCR NEGATIVE NEGATIVE Final   Influenza B by PCR NEGATIVE NEGATIVE Final    Comment: (NOTE) The Xpert Xpress SARS-CoV-2/FLU/RSV plus assay is intended as an aid in the diagnosis of influenza from Nasopharyngeal swab specimens and should not be used as a sole basis for treatment. Nasal washings and aspirates are unacceptable for Xpert Xpress SARS-CoV-2/FLU/RSV testing.  Fact Sheet for Patients: EntrepreneurPulse.com.au  Fact Sheet for Healthcare Providers: IncredibleEmployment.be  This test is not yet approved or cleared by the [place] FDA and has been authorized for detection and/or diagnosis of SARS-CoV-2 by FDA under an Emergency Use Authorization (EUA). This EUA will remain in effect (meaning this test can be used) for the duration of the COVID-19 declaration under Section 564(b)(1) of the Act, 21 U.S.C. section 360bbb-3(b)(1), unless the authorization is terminated or revoked.  Performed at [facility], [place] [address], [place], [place] [address]   Blood Culture (routine x 2)     Status: None   Collection Time: 03/13/21 10:35 PM   Specimen: BLOOD LEFT HAND  Result Value Ref Range Status   Specimen Description  BLOOD LEFT HAND  Final   Special Requests   Final    BOTTLES DRAWN AEROBIC AND ANAEROBIC Blood Culture adequate volume   Culture   Final    NO GROWTH 5 DAYS Performed at [facility], [place] [address], [place],  [address]    Report Status 03/18/2021 FINAL  Final  Blood Culture (routine x 2)     Status: None   Collection Time: 03/13/21 10:55 PM   Specimen: BLOOD LEFT HAND  Result Value Ref Range Status   Specimen Description BLOOD LEFT HAND  Final   Special Requests   Final    BOTTLES DRAWN AEROBIC AND ANAEROBIC Blood Culture results may not be optimal due to an inadequate volume of blood received in culture bottles   Culture   Final    NO GROWTH 5 DAYS Performed at [facility], [place] [address], [place], [place] [address]    Report Status 03/18/2021 FINAL  Final     Radiology Studies: No results found.   LOS: 5 days   Antonieta Pert, MD [facility]  03/20/2021, 12:35 PM

## 2021-03-20 NOTE — Care Management Important Message (Signed)
Important Message  Patient Details  Name: Randall Odom MRN: [id number] Date of Birth: April 15, 1926   Medicare Important Message Given:  Yes     Hannah Beat 03/20/2021, 1:34 PM

## 2021-03-21 LAB — GLUCOSE, CAPILLARY
Glucose-Capillary: 135 mg/dL — ABNORMAL HIGH (ref 70–99)
Glucose-Capillary: 163 mg/dL — ABNORMAL HIGH (ref 70–99)
Glucose-Capillary: 149 mg/dL — ABNORMAL HIGH (ref 70–99)
Glucose-Capillary: 167 mg/dL — ABNORMAL HIGH (ref 70–99)

## 2021-03-21 MED ORDER — MAGNESIUM OXIDE -MG SUPPLEMENT 400 (240 MG) MG PO TABS
400.0000 mg | ORAL_TABLET | Freq: Two times a day (BID) | ORAL | Status: AC
  Administered 2021-03-21 – 2021-03-23 (×5): 400 mg via ORAL
  Filled 2021-03-21 (×6): qty 1

## 2021-03-21 NOTE — Plan of Care (Signed)
  Problem: Nutrition: Goal: Adequate nutrition will be maintained Outcome: Progressing   Problem: Pain Managment: Goal: General experience of comfort will improve Outcome: Progressing   Problem: Safety: Goal: Ability to remain free from injury will improve Outcome: Progressing   

## 2021-03-21 NOTE — Progress Notes (Signed)
PROGRESS NOTE    Randall Odom  AJG:[id number] DOB: Dec 29, 1926 DOA: 03/13/2021 PCP: Prince Solian, MD   Chief Complaint  Patient presents with   Fall  Brief Narrative/Hospital Course: [place] Odom, 88 y.o. male with PMH of afib, CAD, combined s/d CHF, CHB s/p pacer, DMII, DJD, diverticulosis, glaucoma, BPH, HTN, HLD who presented to the ER with worsening fatigue and falls.  He was described to be sleeping more at home and having had 4 mechanical falls prior to admission.  He also had developed a nonproductive cough with loose stools which the latter are described as chronic. On work-up in the ER he was found to be positive for COVID-19 and was admitted for treatment and further work-up.  Patient completed 3 days of remdesivir, was hydrated for AKI, at this time remains weak deconditioned and awaiting for skilled nursing facility  Subjective: Seen this morning.  Patient reports he would like to get up with physical therapy feeling weak Overnight no fever.  Blood pressure stable No shortness of breath cough or chills.  Assessment & Plan:  COVID-19 virus infection: Testedpositive on 1/3,High risk of severe disease s/p 3 days of remdesivir patient not hypoxic.    At this time doing well overall stable.  Intermittent cough continue antitussive, supportive care,  Physical deconditioning/Debility continue PT OT awaiting for skilled nursing facility.  AKI on CKD stage IIIa Baseline creatinine around 1.3, presented with 1.7 creatinine has returned to baseline.  Encourage oral hydration. Recent Labs  Lab 03/16/21 0526  BUN 20  CREATININE 1.29*   Permanent atrial fibrillation continue home Eliquis. CAD-continue Eliquis holding Lasix and lisinopril for now Essential hypertension BP stable remained stable ,terazosin Lasix lisinopril on hold  Diarrhea chronic Teated for C. difficile multiple times in the past; no diarrhea currently.  Dementia Pleasant communicative interactive.   Continue supportive care.    Chronic combined systolic and diastolic CHF volume status is stable Lasix remains on hold due to AKI resume upon discharge  Hypomagnesemia-cont mag ox po Hypoalbuminemia-encourage oral intake, augment nutrition Diabetes mellitus  stable on sliding scale. Recent Labs  Lab 03/20/21 0820 03/20/21 1157 03/20/21 1646 03/20/21 2146 03/21/21 0822  GLUCAP 142* 213* 157* 130* 135*   Goals of care/CODE STATUS currently DNR. Physical deconditioning: Continue PT OT, awaiting for skilled nursing facility no offers yet  DVT prophylaxis:   Eliquis Code Status:   Code Status: DNR Family Communication: plan of care discussed with patient at bedside.  Status is: Inpatient Remains inpatient appropriate because: Ongoing management of deconditioning and care, pending placement Disposition: Currently is medically stable for discharge. Anticipated Disposition: snf once bed available.  Discussed during multidisciplinary round this morning   Total time spent in the care of this patient 25 MINUTES Objective: Vitals last 24 hrs: Vitals:   03/20/21 1649 03/20/21 2154 03/21/21 0535 03/21/21 0824  BP: 137/70 126/66 (!) 120/59 127/61  Pulse: 72 74 73 74  Resp: 15 19 18 17   Temp: 98.4 F (36.9 C) 98.5 F (36.9 C) 98.6 F (37 C) (!) 97.5 F (36.4 C)  TempSrc: Oral Oral Oral Oral  SpO2: 98% 96% 95% 96%  Weight:   88.1 kg   Height:       Weight change:   Intake/Output Summary (Last 24 hours) at 03/21/2021 1023 Last data filed at 03/21/2021 0540 Gross per 24 hour  Intake 360 ml  Output 650 ml  Net -290 ml   Net IO Since Admission: -918.49 mL [03/21/21 1023]   Physical  Examination: General exam: AAOx 2-3 older than stated age, weak appearing. HEENT:Oral mucosa moist, Ear/Nose WNL grossly, dentition normal. Respiratory system: bilaterally clear, no use of accessory muscle Cardiovascular system: S1 & S2 +, No JVD,. Gastrointestinal system: Abdomen soft, NT,ND,  BS+ Nervous System:Alert, awake, moving extremities and grossly nonfocal Extremities: no edema, distal peripheral pulses palpable.  Skin: No rashes,no icterus. MSK: Normal muscle bulk,tone, power   Medications reviewed:  Scheduled Meds:  apixaban  5 mg Oral BID   insulin aspart  0-5 Units Subcutaneous QHS   insulin aspart  0-6 Units Subcutaneous TID WC   magnesium oxide  400 mg Oral BID   Continuous Infusions:  Diet Order             Diet heart healthy/carb modified Room service appropriate? Yes; Fluid consistency: Thin  Diet effective now                          Weight change:   Wt Readings from Last 3 Encounters:  03/21/21 88.1 kg  04/05/19 96.2 kg  03/24/19 98.9 kg     Consultants:see note  Procedures:see note Antimicrobials: Anti-infectives (From admission, onward)    Start     Dose/Rate Route Frequency Ordered Stop   03/15/21 1000  remdesivir 100 mg in sodium chloride 0.9 % 100 mL IVPB       See Hyperspace for full Linked Orders Report.   100 mg 200 mL/hr over 30 Minutes Intravenous Daily 03/14/21 0143 03/16/21 1019   03/14/21 0300  remdesivir 200 mg in sodium chloride 0.9% 250 mL IVPB       See Hyperspace for full Linked Orders Report.   200 mg 580 mL/hr over 30 Minutes Intravenous Once 03/14/21 0143 03/14/21 0527      Culture/Microbiology    Component Value Date/Time   SDES BLOOD LEFT HAND 03/13/2021 2255   SPECREQUEST  03/13/2021 2255    BOTTLES DRAWN AEROBIC AND ANAEROBIC Blood Culture results may not be optimal due to an inadequate volume of blood received in culture bottles   CULT  03/13/2021 2255    NO GROWTH 5 DAYS Performed at [facility], [place] [address] [place], [place] [address]    REPTSTATUS 03/18/2021 FINAL 03/13/2021 2255    Other culture-see note  Unresulted Labs (From admission, onward)    None     Data Reviewed: I have personally reviewed following labs and imaging studies CBC: No results for input(s): WBC,  NEUTROABS, HGB, HCT, MCV, PLT in the last 168 hours.  Basic Metabolic Panel: Recent Labs  Lab 03/16/21 0526  NA 137  K 4.7  CL 104  CO2 24  GLUCOSE 150*  BUN 20  CREATININE 1.29*  CALCIUM 8.3*  MG 1.6*   GFR: Estimated Creatinine Clearance: 40.8 mL/min (A) (by C-G formula based on SCr of 1.29 mg/dL (H)). Liver Function Tests: Recent Labs  Lab 03/16/21 0526  AST 42*  ALT 23  ALKPHOS 62  BILITOT 0.3  PROT 5.4*  ALBUMIN 2.6*   No results for input(s): LIPASE, AMYLASE in the last 168 hours.  No results for input(s): AMMONIA in the last 168 hours. Coagulation Profile: No results for input(s): INR, PROTIME in the last 168 hours.  Cardiac Enzymes: No results for input(s): CKTOTAL, CKMB, CKMBINDEX, TROPONINI in the last 168 hours. BNP (last 3 results) No results for input(s): PROBNP in the last 8760 hours. HbA1C: No results for input(s): HGBA1C in the last 72 hours.  CBG: Recent Labs  Lab 03/20/21 0820 03/20/21 1157 03/20/21 1646 03/20/21 2146 03/21/21 0822  GLUCAP 142* 213* 157* 130* 135*   Lipid Profile: No results for input(s): CHOL, HDL, LDLCALC, TRIG, CHOLHDL, LDLDIRECT in the last 72 hours. Thyroid Function Tests: No results for input(s): TSH, T4TOTAL, FREET4, T3FREE, THYROIDAB in the last 72 hours. Anemia Panel: No results for input(s): VITAMINB12, FOLATE, FERRITIN, TIBC, IRON, RETICCTPCT in the last 72 hours. Sepsis Labs: No results for input(s): PROCALCITON, LATICACIDVEN in the last 168 hours.   Recent Results (from the past 240 hour(s))  Resp Panel by RT-PCR (Flu A&B, Covid) Nasopharyngeal Swab     Status: Abnormal   Collection Time: 03/13/21  7:46 PM   Specimen: Nasopharyngeal Swab; Nasopharyngeal(NP) swabs in vial transport medium  Result Value Ref Range Status   SARS Coronavirus 2 by RT PCR POSITIVE (A) NEGATIVE Final    Comment: (NOTE) SARS-CoV-2 target nucleic acids are DETECTED.  The SARS-CoV-2 RNA is generally detectable in upper  respiratory specimens during the acute phase of infection. Positive results are indicative of the presence of the identified virus, but do not rule out bacterial infection or co-infection with other pathogens not detected by the test. Clinical correlation with patient history and other diagnostic information is necessary to determine patient infection status. The expected result is Negative.  Fact Sheet for Patients: EntrepreneurPulse.com.au  Fact Sheet for Healthcare Providers: IncredibleEmployment.be  This test is not yet approved or cleared by the [place] FDA and  has been authorized for detection and/or diagnosis of SARS-CoV-2 by FDA under an Emergency Use Authorization (EUA).  This EUA will remain in effect (meaning this test can be used) for the duration of  the COVID-19 declaration under Section 564(b)(1) of the A ct, 21 U.S.C. section 360bbb-3(b)(1), unless the authorization is terminated or revoked sooner.     Influenza A by PCR NEGATIVE NEGATIVE Final   Influenza B by PCR NEGATIVE NEGATIVE Final    Comment: (NOTE) The Xpert Xpress SARS-CoV-2/FLU/RSV plus assay is intended as an aid in the diagnosis of influenza from Nasopharyngeal swab specimens and should not be used as a sole basis for treatment. Nasal washings and aspirates are unacceptable for Xpert Xpress SARS-CoV-2/FLU/RSV testing.  Fact Sheet for Patients: EntrepreneurPulse.com.au  Fact Sheet for Healthcare Providers: IncredibleEmployment.be  This test is not yet approved or cleared by the [place] FDA and has been authorized for detection and/or diagnosis of SARS-CoV-2 by FDA under an Emergency Use Authorization (EUA). This EUA will remain in effect (meaning this test can be used) for the duration of the COVID-19 declaration under Section 564(b)(1) of the Act, 21 U.S.C. section 360bbb-3(b)(1), unless the authorization is  terminated or revoked.  Performed at [facility], [place] [address], [place], [place] [address]   Blood Culture (routine x 2)     Status: None   Collection Time: 03/13/21 10:35 PM   Specimen: BLOOD LEFT HAND  Result Value Ref Range Status   Specimen Description BLOOD LEFT HAND  Final   Special Requests   Final    BOTTLES DRAWN AEROBIC AND ANAEROBIC Blood Culture adequate volume   Culture   Final    NO GROWTH 5 DAYS Performed at [facility], [place] [address] [place], [place] [address]    Report Status 03/18/2021 FINAL  Final  Blood Culture (routine x 2)     Status: None   Collection Time: 03/13/21 10:55 PM   Specimen: BLOOD LEFT HAND  Result Value  Ref Range Status   Specimen Description BLOOD LEFT HAND  Final   Special Requests   Final    BOTTLES DRAWN AEROBIC AND ANAEROBIC Blood Culture results may not be optimal due to an inadequate volume of blood received in culture bottles   Culture   Final    NO GROWTH 5 DAYS Performed at [facility], [place] [address], [place], [place] [address]    Report Status 03/18/2021 FINAL  Final     Radiology Studies: No results found.   LOS: 6 days   Antonieta Pert, MD [facility]  03/21/2021, 10:23 AM

## 2021-03-21 NOTE — TOC Initial Note (Signed)
Transition of Care [facility]) - Initial/Assessment Note    Patient Details  Name: Randall Odom MRN: [id number] Date of Birth: 1926/10/01  [facility] CM/SW Contact:    Emeterio Reeve, LCSW Phone Number: 03/21/2021, 3:45 PM  Clinical Narrative:                  CSW received SNF consult. CSW called pts wife on hone, Wife asked CSW to call son Randall Odom. CSW spoke to [place] on the phone. CSW introduced self and explained role at the hospital. Randall Odom reports that PTA pt was living at home with his wife. Pt used a walker to get around the house. Randall Odom reports multiple falls where the fire department had to get him and take him to bed. Randall Odom reports pt could preform most of his adls with slight help or independently.   CSW reviewed PT/OT recommendations for SNF. Randall Odom reports that he feels that SNF will be beneficial. Randall Odom reports pt has been to [facility] in the past and is fine with him returning and reports its close enough for his mother to drive there.  Pt gave CSW permission to fax out to facilities in the area. CSW gave pt medicare.gov rating list to review. CSW explained insurance auth process.   CSW will continue to follow.   Expected Discharge Plan: Skilled Nursing Facility Barriers to Discharge: Continued Medical Work up, Ship broker, SNF Covid   Patient Goals and CMS Choice Patient states their goals for this hospitalization and ongoing recovery are:: Pt unable to participate in goal setting CMS Medicare.gov Compare Post Acute Care list provided to:: Patient Choice offered to / list presented to : Patient  Expected Discharge Plan and Services Expected Discharge Plan: [place] arrangements for the past 2 months: Single Family Home                                      Prior Living Arrangements/Services Living arrangements for the past 2 months: Single Family Home Lives with:: Spouse Patient language and need for interpreter  reviewed:: Yes Do you feel safe going back to the place where you live?: Yes      Need for Family Participation in Patient Care: Yes (Comment) Care giver support system in place?: Yes (comment)   Criminal Activity/Legal Involvement Pertinent to Current Situation/Hospitalization: No - Comment as needed  Activities of Daily Living      Permission Sought/Granted Permission sought to share information with : Facility Sport and exercise psychologist Permission granted to share information with : Yes, Verbal Permission Granted  Share Information with NAME: Randall Odom, son, [phone]  Permission granted to share info w AGENCY: SNF        Emotional Assessment Appearance:: Appears stated age Attitude/Demeanor/Rapport: Unable to Assess Affect (typically observed): Unable to Assess Orientation: : Oriented to Self, Oriented to Place Alcohol / Substance Use: Not Applicable Psych Involvement: No (comment)  Admission diagnosis:  Generalized weakness [R53.1] Multiple falls [R29.6] Fall, initial encounter [W19.XXXA] COVID-19 [U07.1] Patient Active Problem List   Diagnosis Date Noted   Positive D dimer 03/14/2021   COVID-19 virus infection    Multiple falls 03/13/2021   Recurrent Clostridium difficile diarrhea 08/14/2020   Chronic respiratory failure with hypoxia ([place]) 09/11/2016   Dementia ([place]) 09/11/2016   Acute renal failure superimposed on stage 3a chronic kidney disease ([place]) 09/11/2016   Sepsis ([place])  09/11/2016   Leukocytosis 09/11/2016   Hematuria 09/11/2016   Hyponatremia 09/11/2016   Thrush 09/11/2016   Community acquired pneumonia    Pulmonary infiltrate 08/30/2016   Acute respiratory failure with hypoxemia ([place]) 08/29/2016   Abnormal CT of the chest 08/29/2016   Acute and chronic respiratory failure with hypoxia ([place]) 08/29/2016   Chronic anticoagulation 07/10/2016   Abnormal CT scan, chest 08/13/2013   Hyperlipidemia 08/09/2013   Essential hypertension 08/09/2013   Dyspnea  on exertion 08/09/2013   Chronic combined systolic and diastolic heart failure ([place]) 08/09/2013   Permanent atrial fibrillation ([place]) 02/22/2013   Complete heart block ([place]) 02/22/2013   Occlusion and stenosis of carotid artery without mention of cerebral infarction 06/02/2012   Physical deconditioning 05/18/2012   C. difficile colitis 05/13/2012   Dehydration 05/13/2012   Acute renal failure ([place]) 05/12/2012   Hypokalemia 05/12/2012   Syncope and collapse 05/06/2011   Diabetes mellitus ([place]) 05/06/2011   Pacemaker 05/06/2011   CAD (coronary artery disease) 05/06/2011   PCP:  Prince Solian, MD Pharmacy:   [id number] - [place], [place] - [address] [place] [address] Phone: [phone] Fax: [phone]     Social Determinants of Health (SDOH) Interventions    Readmission Risk Interventions No flowsheet data found.  Emeterio Reeve, LCSW Clinical Social Worker

## 2021-03-22 LAB — GLUCOSE, CAPILLARY
Glucose-Capillary: 142 mg/dL — ABNORMAL HIGH (ref 70–99)
Glucose-Capillary: 172 mg/dL — ABNORMAL HIGH (ref 70–99)
Glucose-Capillary: 166 mg/dL — ABNORMAL HIGH (ref 70–99)

## 2021-03-22 MED ORDER — HYDROXYZINE HCL 10 MG PO TABS
10.0000 mg | ORAL_TABLET | Freq: Three times a day (TID) | ORAL | Status: DC | PRN
  Filled 2021-03-22 (×2): qty 1

## 2021-03-22 MED ORDER — HYDROXYZINE HCL 50 MG/ML IM SOLN
25.0000 mg | Freq: Once | INTRAMUSCULAR | Status: AC
  Administered 2021-03-23: 25 mg via INTRAMUSCULAR
  Filled 2021-03-22: qty 0.5

## 2021-03-22 NOTE — Plan of Care (Signed)
  Problem: Nutrition: Goal: Adequate nutrition will be maintained Outcome: Progressing   Problem: Pain Managment: Goal: General experience of comfort will improve Outcome: Progressing   Problem: Safety: Goal: Ability to remain free from injury will improve Outcome: Progressing   

## 2021-03-22 NOTE — Progress Notes (Signed)
Pt refused meds, insulin blood sugar test and is verbally threatened to hit MD informed.

## 2021-03-22 NOTE — Progress Notes (Signed)
PROGRESS NOTE    Randall Odom  XVQ:[id number] DOB: 10-Feb-1927 DOA: 03/13/2021 PCP: Prince Solian, MD   Chief Complaint  Patient presents with   Fall  Brief Narrative/Hospital Course: Randall Odom, 88 y.o. male with PMH of afib, CAD, combined s/d CHF, CHB s/p pacer, DMII, DJD, diverticulosis, glaucoma, BPH, HTN, HLD who presented to the ER with worsening fatigue and falls.  He was described to be sleeping more at home and having had 4 mechanical falls prior to admission.  He also had developed a nonproductive cough with loose stools which the latter are described as chronic. On work-up in the ER he was found to be positive for COVID-19 and was admitted for treatment and further work-up.  Patient completed 3 days of remdesivir, was hydrated for AKI, at this time remains weak deconditioned and awaiting for skilled nursing facility  Subjective: Seen and examined this morning.  No new complaints.  Somewhat anxious, nursing report he has been howling, wanting to get out of hospital. On my exam he appears anxious reports he would like to get to rehab soon  Mild cough otherwise no complaints  Assessment & Plan:  COVID-19 virus infection: Testedpositive on 1/3,High risk of severe disease s/p 3 days of remdesivir patient not hypoxic.Intermittent cough continue antitussive, supportive care.  Overall he remains a stable  Physical deconditioning/Debility continue PT OT awaiting for skilled nursing facility.  TOC working on placement  AKI on CKD stage IIIa Baseline creatinine around 1.3, presented with 1.7 creatinine has returned to baseline.  Encourage oral hydration Recent Labs  Lab 03/16/21 0526  BUN 20  CREATININE 1.29*    Permanent atrial fibrillation rate is stable continue home Eliquis. CAD-continue Eliquis holding Lasix and lisinopril for now Essential hypertension BP remains a stable -terazosin Lasix lisinopril on hold.  Diarrhea chronic Teated for C. difficile multiple times in  the past; no diarrhea currently.  Dementia Pleasant communicative interactive.  Continue supportive care.   Anxious add Atarax as needed  Chronic combined systolic and diastolic CHF volume status is stable Lasix remains on hold due to AKI resume upon discharge  Hypomagnesemia-cont mag ox po Hypoalbuminemia-encourage oral intake, augment nutrition Diabetes mellitus controlled, cont on sliding scale. Recent Labs  Lab 03/21/21 0822 03/21/21 1152 03/21/21 1644 03/21/21 2127 03/22/21 0836  GLUCAP 135* 163* 149* 167* 142*    Goals of care/CODE STATUS currently DNR. Physical deconditioning: Continue PT OT, awaiting for skilled nursing facility no offers yet  DVT prophylaxis:   Eliquis Code Status:   Code Status: DNR Family Communication: plan of care discussed with patient at bedside.  Status is: Inpatient Remains inpatient appropriate because: Ongoing management of deconditioning and care, pending placement Disposition: Currently is medically stable for discharge. Anticipated Disposition: snf once bed available.  Discussed during multidisciplinary round   Total time spent in the care of this patient 25 MINUTES Objective: Vitals last 24 hrs: Vitals:   03/21/21 1648 03/21/21 2100 03/22/21 0629 03/22/21 0837  BP: 121/66 132/68 109/71 123/72  Pulse: 72 76 82 68  Resp: 16 17 17 18   Temp: (!) 97.5 F (36.4 C) 97.6 F (36.4 C) 97.8 F (36.6 C) 97.7 F (36.5 C)  TempSrc: Oral Oral Oral Oral  SpO2: 98% 99% 95% 96%  Weight:      Height:       Weight change:   Intake/Output Summary (Last 24 hours) at 03/22/2021 1052 Last data filed at 03/22/2021 1010 Gross per 24 hour  Intake 120 ml  Output  350 ml  Net -230 ml    Net IO Since Admission: -1,148.49 mL [03/22/21 1052]   Physical Examination: General exam: AAO fairly oriented, elderly, older than stated age, weak appearing. HEENT:Oral mucosa moist, Ear/Nose WNL grossly, dentition normal. Respiratory system: bilaterally mild  basal crackles, clear air movement otherwise, no use of accessory muscle Cardiovascular system: S1 & S2 +, No JVD,. Gastrointestinal system: Abdomen soft,NT,ND, BS+ Nervous System:Alert, awake, moving extremities and grossly nonfocal Extremities: no edema, distal peripheral pulses palpable.  Skin: No rashes,no icterus. MSK: Normal muscle bulk,tone, power   Medications reviewed:  Scheduled Meds:  apixaban  5 mg Oral BID   insulin aspart  0-5 Units Subcutaneous QHS   insulin aspart  0-6 Units Subcutaneous TID WC   magnesium oxide  400 mg Oral BID   Continuous Infusions:  Diet Order             Diet heart healthy/carb modified Room service appropriate? Yes; Fluid consistency: Thin  Diet effective now                 Weight change:   Wt Readings from Last 3 Encounters:  03/21/21 88.1 kg  04/05/19 96.2 kg  03/24/19 98.9 kg  Consultants:see note  Procedures:see note Antimicrobials: Anti-infectives (From admission, onward)    Start     Dose/Rate Route Frequency Ordered Stop   03/15/21 1000  remdesivir 100 mg in sodium chloride 0.9 % 100 mL IVPB       See Hyperspace for full Linked Orders Report.   100 mg 200 mL/hr over 30 Minutes Intravenous Daily 03/14/21 0143 03/16/21 1019   03/14/21 0300  remdesivir 200 mg in sodium chloride 0.9% 250 mL IVPB       See Hyperspace for full Linked Orders Report.   200 mg 580 mL/hr over 30 Minutes Intravenous Once 03/14/21 0143 03/14/21 0527     Culture/Microbiology    Component Value Date/Time   SDES BLOOD LEFT HAND 03/13/2021 2255   SPECREQUEST  03/13/2021 2255    BOTTLES DRAWN AEROBIC AND ANAEROBIC Blood Culture results may not be optimal due to an inadequate volume of blood received in culture bottles   CULT  03/13/2021 2255    NO GROWTH 5 DAYS Performed at [facility], [place] [address], [place], [place] [address]    REPTSTATUS 03/18/2021 FINAL 03/13/2021 2255    Other culture-see note  Unresulted Labs (From admission,  onward)    None     Data Reviewed: I have personally reviewed following labs and imaging studies CBC: No results for input(s): WBC, NEUTROABS, HGB, HCT, MCV, PLT in the last 168 hours.  Basic Metabolic Panel: Recent Labs  Lab 03/16/21 0526  NA 137  K 4.7  CL 104  CO2 24  GLUCOSE 150*  BUN 20  CREATININE 1.29*  CALCIUM 8.3*  MG 1.6*    GFR: Estimated Creatinine Clearance: 40.8 mL/min (A) (by C-G formula based on SCr of 1.29 mg/dL (H)). Liver Function Tests: Recent Labs  Lab 03/16/21 0526  AST 42*  ALT 23  ALKPHOS 62  BILITOT 0.3  PROT 5.4*  ALBUMIN 2.6*    No results for input(s): LIPASE, AMYLASE in the last 168 hours.  No results for input(s): AMMONIA in the last 168 hours. Coagulation Profile: No results for input(s): INR, PROTIME in the last 168 hours.  Cardiac Enzymes: No results for input(s): CKTOTAL, CKMB, CKMBINDEX, TROPONINI in the last 168 hours. BNP (last 3 results) No results for input(s): PROBNP  in the last 8760 hours. HbA1C: No results for input(s): HGBA1C in the last 72 hours. CBG: Recent Labs  Lab 03/21/21 0822 03/21/21 1152 03/21/21 1644 03/21/21 2127 03/22/21 0836  GLUCAP 135* 163* 149* 167* 142*    Lipid Profile: No results for input(s): CHOL, HDL, LDLCALC, TRIG, CHOLHDL, LDLDIRECT in the last 72 hours. Thyroid Function Tests: No results for input(s): TSH, T4TOTAL, FREET4, T3FREE, THYROIDAB in the last 72 hours. Anemia Panel: No results for input(s): VITAMINB12, FOLATE, FERRITIN, TIBC, IRON, RETICCTPCT in the last 72 hours. Sepsis Labs: No results for input(s): PROCALCITON, LATICACIDVEN in the last 168 hours.   Recent Results (from the past 240 hour(s))  Resp Panel by RT-PCR (Flu A&B, Covid) Nasopharyngeal Swab     Status: Abnormal   Collection Time: 03/13/21  7:46 PM   Specimen: Nasopharyngeal Swab; Nasopharyngeal(NP) swabs in vial transport medium  Result Value Ref Range Status   SARS Coronavirus 2 by RT PCR POSITIVE (A)  NEGATIVE Final    Comment: (NOTE) SARS-CoV-2 target nucleic acids are DETECTED.  The SARS-CoV-2 RNA is generally detectable in upper respiratory specimens during the acute phase of infection. Positive results are indicative of the presence of the identified virus, but do not rule out bacterial infection or co-infection with other pathogens not detected by the test. Clinical correlation with patient history and other diagnostic information is necessary to determine patient infection status. The expected result is Negative.  Fact Sheet for Patients: EntrepreneurPulse.com.au  Fact Sheet for Healthcare Providers: IncredibleEmployment.be  This test is not yet approved or cleared by the [place] FDA and  has been authorized for detection and/or diagnosis of SARS-CoV-2 by FDA under an Emergency Use Authorization (EUA).  This EUA will remain in effect (meaning this test can be used) for the duration of  the COVID-19 declaration under Section 564(b)(1) of the A ct, 21 U.S.C. section 360bbb-3(b)(1), unless the authorization is terminated or revoked sooner.     Influenza A by PCR NEGATIVE NEGATIVE Final   Influenza B by PCR NEGATIVE NEGATIVE Final    Comment: (NOTE) The Xpert Xpress SARS-CoV-2/FLU/RSV plus assay is intended as an aid in the diagnosis of influenza from Nasopharyngeal swab specimens and should not be used as a sole basis for treatment. Nasal washings and aspirates are unacceptable for Xpert Xpress SARS-CoV-2/FLU/RSV testing.  Fact Sheet for Patients: EntrepreneurPulse.com.au  Fact Sheet for Healthcare Providers: IncredibleEmployment.be  This test is not yet approved or cleared by the [place] FDA and has been authorized for detection and/or diagnosis of SARS-CoV-2 by FDA under an Emergency Use Authorization (EUA). This EUA will remain in effect (meaning this test can be used) for the  duration of the COVID-19 declaration under Section 564(b)(1) of the Act, 21 U.S.C. section 360bbb-3(b)(1), unless the authorization is terminated or revoked.  Performed at [facility], [place] [address], [place], [place] [address]   Blood Culture (routine x 2)     Status: None   Collection Time: 03/13/21 10:35 PM   Specimen: BLOOD LEFT HAND  Result Value Ref Range Status   Specimen Description BLOOD LEFT HAND  Final   Special Requests   Final    BOTTLES DRAWN AEROBIC AND ANAEROBIC Blood Culture adequate volume   Culture   Final    NO GROWTH 5 DAYS Performed at [facility], [place] [address] [place], [place] [address]    Report Status 03/18/2021 FINAL  Final  Blood Culture (routine x 2)     Status:  None   Collection Time: 03/13/21 10:55 PM   Specimen: BLOOD LEFT HAND  Result Value Ref Range Status   Specimen Description BLOOD LEFT HAND  Final   Special Requests   Final    BOTTLES DRAWN AEROBIC AND ANAEROBIC Blood Culture results may not be optimal due to an inadequate volume of blood received in culture bottles   Culture   Final    NO GROWTH 5 DAYS Performed at [facility], [place] [address] [place], [place] [address]    Report Status 03/18/2021 FINAL  Final      Radiology Studies: No results found.   LOS: 7 days   Antonieta Pert, MD [facility]  03/22/2021, 10:52 AM

## 2021-03-22 NOTE — Progress Notes (Signed)
Occupational Therapy Treatment Patient Details Name: Randall Odom MRN: [id number] DOB: 1926/10/14 Today's Date: 03/22/2021   History of present illness 88 y/o male admitted secondary to weakness and multiple falls. COVID-19 positive. PMH includes dementia, HTN, CHF, CAD, a fib, and s/p pacemaker.   OT comments  Pt stating he was talking to his wife on the phone upon therapist's entry, no one on the phone. Assisted pt to EOB with max assist for LEs over EOB and hips to EOB, pt pulled trunk up using therapist's hand. Stood and transferred to chair with +2 assist with multimodal cues for technique and to place hands on walker. Total assist in standing for pericare. Completed grooming with set up. Therapist placed call to wife at end of session and set pt up to eat lunch. Pt with high anxiety and fear of falling. Appropriate for SNF level rehab.    Recommendations for follow up therapy are one component of a multi-disciplinary discharge planning process, led by the attending physician.  Recommendations may be updated based on patient status, additional functional criteria and insurance authorization.    Follow Up Recommendations  Skilled nursing-short term rehab (<3 hours/day)    Assistance Recommended at Discharge Frequent or constant Supervision/Assistance  Patient can return home with the following  A lot of help with walking and/or transfers;A lot of help with bathing/dressing/bathroom;Direct supervision/assist for medications management;Direct supervision/assist for financial management;Assist for transportation   Equipment Recommendations  Other (comment) (defer to next venue)    Recommendations for Other Services      Precautions / Restrictions Precautions Precautions: Fall;Other (comment) Precaution Comments: airborne Restrictions Weight Bearing Restrictions: No       Mobility Bed Mobility Overal bed mobility: Needs Assistance Bed Mobility: Supine to Sit     Supine to  sit: Max assist     General bed mobility comments: max A to initiate LEs to EOB and used pads underneath pt to roate hips to sit to EOB, maxA for trunk elevation, max encouragement provided as pt states "I just don't feel good"    Transfers Overall transfer level: Needs assistance Equipment used: Rolling walker (2 wheels) Transfers: Bed to chair/wheelchair/BSC;Sit to/from Stand Sit to Stand: +2 physical assistance;Mod assist Stand pivot transfers: +2 physical assistance;Mod assist         General transfer comment: Pt initially gripping tightly on PT and OT to pull up on them, verbal and tactile cues to push up and power up in addition to modAx2, once in stading transitioned pt's hands to RW, max directional verbal cues to pvt to chair, modA for walker management, short shuffled steps     Balance Overall balance assessment: Needs assistance Sitting-balance support: No upper extremity supported;Feet supported Sitting balance-Leahy Scale: Fair     Standing balance support: Bilateral upper extremity supported;During functional activity Standing balance-Leahy Scale: Poor Standing balance comment: completed 2 addition stands from chair however pt with poor tolerance <15 sec despite max encouragement, impulsively sitting                           ADL either performed or assessed with clinical judgement   ADL Overall ADL's : Needs assistance/impaired Eating/Feeding: Set up;Sitting   Grooming: Wash/dry hands;Brushing hair;Sitting;Set up                       Beverly and Hygiene: +2 for physical assistance;Total assistance;Sit to/from stand  Extremity/Trunk Assessment              Vision       Perception     Praxis      Cognition Arousal/Alertness: Awake/alert Behavior During Therapy: Anxious Overall Cognitive Status: History of cognitive impairments - at baseline                                  General Comments: pt sits abruptly, fearful of falling          Exercises     Shoulder Instructions       General Comments VSS, bruising t/o extremities, notable on L hand    Pertinent Vitals/ Pain       Pain Assessment: Faces Faces Pain Scale: Hurts a little bit Pain Location: R knee Pain Descriptors / Indicators: Moaning Pain Intervention(s): Monitored during session  Home Living                                          Prior Functioning/Environment              Frequency  Min 2X/week        Progress Toward Goals  OT Goals(current goals can now be found in the care plan section)  Progress towards OT goals: Progressing toward goals  Acute Rehab OT Goals OT Goal Formulation: With patient Time For Goal Achievement: 03/29/21 Potential to Achieve Goals: Good  Plan Discharge plan remains appropriate;Frequency remains appropriate    Co-evaluation      Reason for Co-Treatment: Necessary to address cognition/behavior during functional activity PT goals addressed during session: Mobility/safety with mobility        AM-PAC OT "6 Clicks" Daily Activity     Outcome Measure   Help from another person eating meals?: None Help from another person taking care of personal grooming?: A Little Help from another person toileting, which includes using toliet, bedpan, or urinal?: Total Help from another person bathing (including washing, rinsing, drying)?: A Lot Help from another person to put on and taking off regular upper body clothing?: A Lot Help from another person to put on and taking off regular lower body clothing?: Total 6 Click Score: 13    End of Session Equipment Utilized During Treatment: Gait belt;Rolling walker (2 wheels)  OT Visit Diagnosis: Other abnormalities of gait and mobility (R26.89);Unsteadiness on feet (R26.81);Muscle weakness (generalized) (M62.81)   Activity Tolerance Patient tolerated treatment well   Patient  Left in chair;with call bell/phone within reach;with chair alarm set   Nurse Communication Mobility status;Need for lift equipment (use stedy to return to bed)        Time: 1540-0867 OT Time Calculation (min): 27 min  Charges: OT General Charges $OT Visit: 1 Visit OT Treatments $Self Care/Home Management : 8-22 mins Nestor Lewandowsky, OTR/L Acute Rehabilitation Services Pager: [phone] Office: [phone]   Malka So 03/22/2021, 1:55 PM

## 2021-03-22 NOTE — Progress Notes (Signed)
Physical Therapy Treatment Patient Details Name: Randall Odom MRN: [id number] DOB: 1926/11/30 Today's Date: 03/22/2021   History of Present Illness 88 y/o male admitted secondary to weakness and multiple falls. COVID-19 positive. PMH includes dementia, HTN, CHF, CAD, a fib, and s/p pacemaker.    PT Comments    Pt continues to require max encouragement to get OOB however responds well to tactile cues and humor. Pt also very motivated by calling his wife to talk to her (in which we did for him at end of session). Pt remains to require mod/maxAx2 for OOB mobility and is unable to ambulate at this time. Pt with poor standing tolerance as well. Continue to recommend SNF upon d/c to maximize functional recovery. Acute PT to cont to follow.    Recommendations for follow up therapy are one component of a multi-disciplinary discharge planning process, led by the attending physician.  Recommendations may be updated based on patient status, additional functional criteria and insurance authorization.  Follow Up Recommendations  Skilled nursing-short term rehab (<3 hours/day)     Assistance Recommended at Discharge Frequent or constant Supervision/Assistance  Patient can return home with the following A lot of help with walking and/or transfers;A lot of help with bathing/dressing/bathroom;Help with stairs or ramp for entrance;Direct supervision/assist for financial management;Direct supervision/assist for medications management;Assist for transportation;Assistance with cooking/housework   Equipment Recommendations  Wheelchair (measurements PT);Wheelchair cushion (measurements PT)    Recommendations for Other Services       Precautions / Restrictions Precautions Precautions: Fall Restrictions Weight Bearing Restrictions: No     Mobility  Bed Mobility Overal bed mobility: Needs Assistance Bed Mobility: Supine to Sit     Supine to sit: Max assist     General bed mobility comments: max A  to initiate LEs to EOB and used pads underneath pt to roate hips to sit to EOB, maxA for trunk elevation, max encouragement provided as pt states "I just don't feel good"    Transfers Overall transfer level: Needs assistance Equipment used: Rolling walker (2 wheels) Transfers: Bed to chair/wheelchair/BSC   Stand pivot transfers: +2 physical assistance;Mod assist         General transfer comment: Pt initially gripping tightly on PT and OT to pull up on them, verbal and tactile cues to push up and power up in addition to modAx2, once in stading transitioned pt's hands to RW, max directional verbal cues to pvt to chair, modA for walker management, short shuffled steps    Ambulation/Gait               General Gait Details: steps in walker to chair   Stairs             Wheelchair Mobility    Modified Rankin (Stroke Patients Only)       Balance Overall balance assessment: Needs assistance Sitting-balance support: No upper extremity supported;Feet supported Sitting balance-Leahy Scale: Fair     Standing balance support: Bilateral upper extremity supported;During functional activity Standing balance-Leahy Scale: Poor Standing balance comment: completed 2 addition stands from chair however pt with poor tolerance <15 sec despite max encouragement, impulsively sitting                            Cognition Arousal/Alertness: Awake/alert Behavior During Therapy: WFL for tasks assessed/performed Overall Cognitive Status: History of cognitive impairments - at baseline  General Comments: Dementia at baseline. Perseverating on topics of his wife, prior jobs, playing baseball. Pleasantly confused and jovial        Exercises      General Comments General comments (skin integrity, edema, etc.): VSS, bruising t/o extremities, notable on L hand      Pertinent Vitals/Pain Pain Assessment: Faces Faces Pain Scale:  Hurts a little bit Pain Location: generalized Pain Descriptors / Indicators: Moaning (with movement) Pain Intervention(s): Monitored during session    Home Living                          Prior Function            PT Goals (current goals can now be found in the care plan section) Acute Rehab PT Goals Patient Stated Goal: to see his wife PT Goal Formulation: With patient Time For Goal Achievement: 03/28/21 Potential to Achieve Goals: Good Progress towards PT goals: Progressing toward goals    Frequency    Min 2X/week      PT Plan Current plan remains appropriate    Co-evaluation PT/OT/SLP Co-Evaluation/Treatment: Yes Reason for Co-Treatment: To address functional/ADL transfers PT goals addressed during session: Mobility/safety with mobility        AM-PAC PT "6 Clicks" Mobility   Outcome Measure  Help needed turning from your back to your side while in a flat bed without using bedrails?: A Little Help needed moving from lying on your back to sitting on the side of a flat bed without using bedrails?: A Lot Help needed moving to and from a bed to a chair (including a wheelchair)?: A Lot Help needed standing up from a chair using your arms (e.g., wheelchair or bedside chair)?: A Lot Help needed to walk in hospital room?: A Lot Help needed climbing 3-5 steps with a railing? : Total 6 Click Score: 12    End of Session Equipment Utilized During Treatment: Gait belt Activity Tolerance: Patient tolerated treatment well Patient left: with call bell/phone within reach;in chair;with chair alarm set Nurse Communication: Mobility status;Need for lift equipment (use steady to transfer pt back to bed) PT Visit Diagnosis: Unsteadiness on feet (R26.81);Muscle weakness (generalized) (M62.81);History of falling (Z91.81);Repeated falls (R29.6)     Time: 2703-5009 PT Time Calculation (min) (ACUTE ONLY): 26 min  Charges:  $Therapeutic Activity: 8-22 mins                      Kittie Plater, PT, DPT Acute Rehabilitation Services Pager #: [phone] Office #: [phone]    Berline Lopes 03/22/2021, 12:56 PM

## 2021-03-22 NOTE — Progress Notes (Signed)
Mobility Specialist Progress Note:   03/22/21 1400  Mobility  Activity Ambulated to bathroom  Level of Assistance Moderate assist, patient does 50-74%  Assistive Device Front wheel walker  Distance Ambulated (ft) 30 ft  Mobility Out of bed for toileting;Sit up in bed/chair position for meals  Mobility Response Tolerated fair  Mobility performed by Mobility specialist  Bed Position Chair  $Mobility charge 1 Mobility   Responded to chair alarm, received pt sitting on leg rest of chair. Pt with increased cognitive deficits this session, even with son in room. Required heavy modA to stand from chair, and during ambulation. Pt with erratic movements, requiring verbal and physical cues throughout session. Pt unable to void, back in chair with chair alarm on.   Nelta Numbers Mobility Specialist  Phone [phone]

## 2021-03-23 LAB — BASIC METABOLIC PANEL
Anion gap: 10 (ref 5–15)
BUN: 26 mg/dL — ABNORMAL HIGH (ref 8–23)
CO2: 27 mmol/L (ref 22–32)
Calcium: 9.2 mg/dL (ref 8.9–10.3)
Chloride: 107 mmol/L (ref 98–111)
Creatinine, Ser: 1.28 mg/dL — ABNORMAL HIGH (ref 0.61–1.24)
GFR, Estimated: 53 mL/min — ABNORMAL LOW (ref >60)
Glucose, Bld: 158 mg/dL — ABNORMAL HIGH (ref 70–99)
Potassium: 4.3 mmol/L (ref 3.5–5.1)
Sodium: 144 mmol/L (ref 135–145)

## 2021-03-23 LAB — GLUCOSE, CAPILLARY
Glucose-Capillary: 135 mg/dL — ABNORMAL HIGH (ref 70–99)
Glucose-Capillary: 147 mg/dL — ABNORMAL HIGH (ref 70–99)
Glucose-Capillary: 139 mg/dL — ABNORMAL HIGH (ref 70–99)
Glucose-Capillary: 158 mg/dL — ABNORMAL HIGH (ref 70–99)

## 2021-03-23 MED ORDER — LACTATED RINGERS IV SOLN: INTRAVENOUS | Status: DC

## 2021-03-23 NOTE — TOC Progression Note (Signed)
Transition of Care [facility]) - Progression Note    Patient Details  Name: Randall Odom MRN: [id number] Date of Birth: 1926-03-23  Transition of Care [facility]) CM/SW Contact  Emeterio Reeve, [place] Phone Number: 03/23/2021, 12:00 PM  Clinical Narrative:     [facility] accepted pt and will have a bed fore him on Monday. Insurance Josem Kaufmann needs to be stated Sunday. No covid test needed.   Expected Discharge Plan: [place] Barriers to Discharge: Continued Medical Work up, Ship broker, SNF Covid  Expected Discharge Plan and Services Expected Discharge Plan: [place] arrangements for the past 2 months: Single Family Home                                       Social Determinants of Health (SDOH) Interventions    Readmission Risk Interventions No flowsheet data found.  Emeterio Reeve, LCSW Clinical Social Worker

## 2021-03-23 NOTE — Progress Notes (Addendum)
PROGRESS NOTE    Randall Odom  TFT:[id number] DOB: May 20, 1926 DOA: 03/13/2021 PCP: Prince Solian, MD   Chief Complaint  Patient presents with   Fall  Brief Narrative/Hospital Course: Randall Odom, 88 y.o. male with PMH of afib, CAD, combined s/d CHF, CHB s/p pacer, DMII, DJD, diverticulosis, glaucoma, BPH, HTN, HLD who presented to the ER with worsening fatigue and falls.  He was described to be sleeping more at home and having had 4 mechanical falls prior to admission.  He also had developed a nonproductive cough with loose stools which the latter are described as chronic. On work-up in the ER he was found to be positive for COVID-19 and was admitted for treatment and further work-up.  Patient completed 3 days of remdesivir, was hydrated for AKI, at this time remains weak deconditioned and awaiting for skilled nursing facility  Subjective:  Overnight patient was verbally threatening refusing meds and sugar check was given Atarax 25 mg IM x1  Mildly confused oriented to self current place  Assessment & Plan:  COVID-19 virus infection: Tested positive on 1/3,High risk of severe disease s/p 3 days of remdesivir patient not hypoxic.Intermittent cough continue antitussive, supportive care.  Overall he remains a stable.  Can cough isolations after 10 days which will be tonight at 9 pm.  Physical deconditioning/Debility continue PT OT awaiting for skilled nursing facility.  TOC working on placement  AKI on CKD stage IIIa Baseline creatinine around 1.3, presented with 1.7 creatinine has returned to baseline.  Encourage oral hydration.  Continue supportive care Check Bmp today  Permanent atrial fibrillation rate controlled, continue home Eliquis. CAD-continue Eliquis holding Lasix and lisinopril for now Essential hypertension BP stable-terazosin Lasix lisinopril on hold.  Diarrhea chronic Teated for C. difficile multiple times in the past; no diarrhea currently.  Dementia Pleasant  communicative interactive.  At times mildly confused refusing care given Atarax overnight IV.  Continue oral Atarax as needed  Anxious prn Atarax   Chronic combined systolic and diastolic CHF volume status is stable Lasix remains on hold due to AKI resume upon discharge  Hypomagnesemia-cont mag ox po Hypoalbuminemia-encourage oral intake, augment nutrition Diabetes mellitus controlled, continue to monitor SSI.   Recent Labs  Lab 03/21/21 2127 03/22/21 0836 03/22/21 1217 03/22/21 1755 03/23/21 0950  GLUCAP 167* 142* 172* 166* 135*   Goals of care/CODE STATUS:currently DNR. Physical deconditioning: Continue PT OT, awaiting for skilled nursing facility no offers yet Addendum 17:45 PM RN concerend that he has had poor po intake- start RL 50 CC/HR ENCOURAGE PO INTAKE  DVT prophylaxis: Eliquis Code Status:   Code Status: DNR Family Communication: plan of care discussed with patient at bedside.  Status is: Inpatient Remains inpatient appropriate because: Ongoing management of deconditioning and care, pending placement Disposition: Currently is medically stable for discharge. Anticipated Disposition: snf once bed available.  Discussed during multidisciplinary round-Per social worker he will have a bed on Monday.   Total time spent in the care of this patient 25 MINUTES Objective: Vitals last 24 hrs: Vitals:   03/22/21 1810 03/22/21 2100 03/23/21 0539 03/23/21 0956  BP: 127/61 (!) 130/50 135/74 125/61  Pulse: 65 73 78 70  Resp: 18 18 18 20   Temp: 97.6 F (36.4 C) (!) 97.4 F (36.3 C) (!) 97.5 F (36.4 C) (!) 96.8 F (36 C)  TempSrc: Oral Oral Oral Oral  SpO2: 95% 97% 95% 100%  Weight:      Height:       Weight change:  Intake/Output Summary (Last 24 hours) at 03/23/2021 1032 Last data filed at 03/22/2021 1812 Gross per 24 hour  Intake 240 ml  Output 200 ml  Net 40 ml    Net IO Since Admission: -1,208.49 mL [03/23/21 1032]  Physical Examination: General exam: AAOx  to self current place date of birth, mildly confused  HEENT:Oral mucosa moist, Ear/Nose WNL grossly, dentition normal. Respiratory system: bilaterally clear, no use of accessory muscle Cardiovascular system: S1 & S2 +, No JVD,. Gastrointestinal system: Abdomen soft, NT,ND, BS+ Nervous System:Alert, awake, moving extremities and grossly nonfocal Extremities: No edema, distal peripheral pulses palpable.  Skin: No rashes,no icterus. MSK: Normal muscle bulk,tone, power    Medications reviewed:  Scheduled Meds:  apixaban  5 mg Oral BID   insulin aspart  0-5 Units Subcutaneous QHS   insulin aspart  0-6 Units Subcutaneous TID WC   magnesium oxide  400 mg Oral BID   Continuous Infusions:  Diet Order             Diet heart healthy/carb modified Room service appropriate? Yes; Fluid consistency: Thin  Diet effective now                 Weight change:   Wt Readings from Last 3 Encounters:  03/21/21 88.1 kg  04/05/19 96.2 kg  03/24/19 98.9 kg  Consultants:see note  Procedures:see note Antimicrobials: Anti-infectives (From admission, onward)    Start     Dose/Rate Route Frequency Ordered Stop   03/15/21 1000  remdesivir 100 mg in sodium chloride 0.9 % 100 mL IVPB       See Hyperspace for full Linked Orders Report.   100 mg 200 mL/hr over 30 Minutes Intravenous Daily 03/14/21 0143 03/16/21 1019   03/14/21 0300  remdesivir 200 mg in sodium chloride 0.9% 250 mL IVPB       See Hyperspace for full Linked Orders Report.   200 mg 580 mL/hr over 30 Minutes Intravenous Once 03/14/21 0143 03/14/21 0527     Culture/Microbiology    Component Value Date/Time   SDES BLOOD LEFT HAND 03/13/2021 2255   SPECREQUEST  03/13/2021 2255    BOTTLES DRAWN AEROBIC AND ANAEROBIC Blood Culture results may not be optimal due to an inadequate volume of blood received in culture bottles   CULT  03/13/2021 2255    NO GROWTH 5 DAYS Performed at [facility], [place] [address], [place], [place]  [address]    REPTSTATUS 03/18/2021 FINAL 03/13/2021 2255    Other culture-see note  Unresulted Labs (From admission, onward)    None     Data Reviewed: I have personally reviewed following labs and imaging studies CBC: No results for input(s): WBC, NEUTROABS, HGB, HCT, MCV, PLT in the last 168 hours.  Basic Metabolic Panel: No results for input(s): NA, K, CL, CO2, GLUCOSE, BUN, CREATININE, CALCIUM, MG, PHOS in the last 168 hours.  GFR: Estimated Creatinine Clearance: 40.8 mL/min (A) (by C-G formula based on SCr of 1.29 mg/dL (H)). Liver Function Tests: No results for input(s): AST, ALT, ALKPHOS, BILITOT, PROT, ALBUMIN in the last 168 hours.  No results for input(s): LIPASE, AMYLASE in the last 168 hours.  No results for input(s): AMMONIA in the last 168 hours. Coagulation Profile: No results for input(s): INR, PROTIME in the last 168 hours.  Cardiac Enzymes: No results for input(s): CKTOTAL, CKMB, CKMBINDEX, TROPONINI in the last 168 hours. BNP (last 3 results) No results for input(s): PROBNP in the last 8760 hours. HbA1C: No  results for input(s): HGBA1C in the last 72 hours. CBG: Recent Labs  Lab 03/21/21 2127 03/22/21 0836 03/22/21 1217 03/22/21 1755 03/23/21 0950  GLUCAP 167* 142* 172* 166* 135*    Lipid Profile: No results for input(s): CHOL, HDL, LDLCALC, TRIG, CHOLHDL, LDLDIRECT in the last 72 hours. Thyroid Function Tests: No results for input(s): TSH, T4TOTAL, FREET4, T3FREE, THYROIDAB in the last 72 hours. Anemia Panel: No results for input(s): VITAMINB12, FOLATE, FERRITIN, TIBC, IRON, RETICCTPCT in the last 72 hours. Sepsis Labs: No results for input(s): PROCALCITON, LATICACIDVEN in the last 168 hours.   Recent Results (from the past 240 hour(s))  Resp Panel by RT-PCR (Flu A&B, Covid) Nasopharyngeal Swab     Status: Abnormal   Collection Time: 03/13/21  7:46 PM   Specimen: Nasopharyngeal Swab; Nasopharyngeal(NP) swabs in vial transport medium  Result  Value Ref Range Status   SARS Coronavirus 2 by RT PCR POSITIVE (A) NEGATIVE Final    Comment: (NOTE) SARS-CoV-2 target nucleic acids are DETECTED.  The SARS-CoV-2 RNA is generally detectable in upper respiratory specimens during the acute phase of infection. Positive results are indicative of the presence of the identified virus, but do not rule out bacterial infection or co-infection with other pathogens not detected by the test. Clinical correlation with patient history and other diagnostic information is necessary to determine patient infection status. The expected result is Negative.  Fact Sheet for Patients: EntrepreneurPulse.com.au  Fact Sheet for Healthcare Providers: IncredibleEmployment.be  This test is not yet approved or cleared by the [place] FDA and  has been authorized for detection and/or diagnosis of SARS-CoV-2 by FDA under an Emergency Use Authorization (EUA).  This EUA will remain in effect (meaning this test can be used) for the duration of  the COVID-19 declaration under Section 564(b)(1) of the A ct, 21 U.S.C. section 360bbb-3(b)(1), unless the authorization is terminated or revoked sooner.     Influenza A by PCR NEGATIVE NEGATIVE Final   Influenza B by PCR NEGATIVE NEGATIVE Final    Comment: (NOTE) The Xpert Xpress SARS-CoV-2/FLU/RSV plus assay is intended as an aid in the diagnosis of influenza from Nasopharyngeal swab specimens and should not be used as a sole basis for treatment. Nasal washings and aspirates are unacceptable for Xpert Xpress SARS-CoV-2/FLU/RSV testing.  Fact Sheet for Patients: EntrepreneurPulse.com.au  Fact Sheet for Healthcare Providers: IncredibleEmployment.be  This test is not yet approved or cleared by the [place] FDA and has been authorized for detection and/or diagnosis of SARS-CoV-2 by FDA under an Emergency Use Authorization (EUA). This EUA  will remain in effect (meaning this test can be used) for the duration of the COVID-19 declaration under Section 564(b)(1) of the Act, 21 U.S.C. section 360bbb-3(b)(1), unless the authorization is terminated or revoked.  Performed at [facility], [place] [address], [place], [place] [address]   Blood Culture (routine x 2)     Status: None   Collection Time: 03/13/21 10:35 PM   Specimen: BLOOD LEFT HAND  Result Value Ref Range Status   Specimen Description BLOOD LEFT HAND  Final   Special Requests   Final    BOTTLES DRAWN AEROBIC AND ANAEROBIC Blood Culture adequate volume   Culture   Final    NO GROWTH 5 DAYS Performed at [facility], [place] [address], [place], [place] [address]    Report Status 03/18/2021 FINAL  Final  Blood Culture (routine x 2)     Status: None   Collection Time: 03/13/21 10:55  PM   Specimen: BLOOD LEFT HAND  Result Value Ref Range Status   Specimen Description BLOOD LEFT HAND  Final   Special Requests   Final    BOTTLES DRAWN AEROBIC AND ANAEROBIC Blood Culture results may not be optimal due to an inadequate volume of blood received in culture bottles   Culture   Final    NO GROWTH 5 DAYS Performed at [facility], [place] [address], [place], [place] [address]    Report Status 03/18/2021 FINAL  Final    Radiology Studies: No results found.   LOS: 8 days   Antonieta Pert, MD [facility]  03/23/2021, 10:32 AM

## 2021-03-24 LAB — GLUCOSE, CAPILLARY
Glucose-Capillary: 131 mg/dL — ABNORMAL HIGH (ref 70–99)
Glucose-Capillary: 145 mg/dL — ABNORMAL HIGH (ref 70–99)
Glucose-Capillary: 141 mg/dL — ABNORMAL HIGH (ref 70–99)

## 2021-03-24 MED ORDER — GUAIFENESIN-DM 100-10 MG/5ML PO SYRP
5.0000 mL | ORAL_SOLUTION | ORAL | Status: DC | PRN
  Administered 2021-03-25 – 2021-03-26 (×2): 5 mL via ORAL
  Filled 2021-03-24 (×4): qty 5

## 2021-03-24 NOTE — Progress Notes (Signed)
Pt refused pericare states does not feel well but also not willing to take prn anxiety meds MD informed no further meds indicated. Pt informed need for pericare consequences of not doing so but continues to refuse and wanting to be left alone

## 2021-03-24 NOTE — Progress Notes (Signed)
Pt refused to take any meds, refused vitals taken. Pt was verbally abusive, and combative.  Educated on the importance of meds but pt continues to refuse.

## 2021-03-24 NOTE — Progress Notes (Signed)
PROGRESS NOTE    Randall Odom  FKC:[id number] DOB: May 01, 1926 DOA: 03/13/2021 PCP: Prince Solian, MD   Chief Complaint  Patient presents with   Fall  Brief Narrative/Hospital Course: Randall Odom, 88 y.o. male with PMH of afib, CAD, combined s/d CHF, CHB s/p pacer, DMII, DJD, diverticulosis, glaucoma, BPH, HTN, HLD who presented to the ER with worsening fatigue and falls.  He was described to be sleeping more at home and having had 4 mechanical falls prior to admission.  He also had developed a nonproductive cough with loose stools which the latter are described as chronic. On work-up in the ER he was found to be positive for COVID-19 and was admitted for treatment and further work-up.  Patient completed 3 days of remdesivir, was hydrated for AKI, at this time remains weak deconditioned and awaiting for skilled nursing facility  Subjective: Seen and examined.  Alert awake nursing reports she has been refusing care. Able to tell me his name current location current president's name.  Assessment & Plan:  COVID-19 virus infection: Tested positive on 1/3,High risk of severe disease s/p 3 days of remdesivir patient not hypoxic.Intermittent cough continue antitussive, supportive care.  Overall he remains stable.  Completing 10 days isolation today.    Physical deconditioning/Debility continue PT OT awaiting for skilled nursing facility.  TOC working on placement  AKI on CKD stage IIIa Baseline creatinine around 1.3, presented with 1.7 creatinine has returned to baseline.  Encourage oral hydration.  Continue supportive care BMP patient stable on repeat labs 1/13 Recent Labs  Lab 03/23/21 1053  BUN 26*  CREATININE 1.28*     Permanent atrial fibrillation rate controlled, continue home Eliquis. CAD-continue Eliquis holding Lasix and lisinopril for now Essential hypertension BP stable-terazosin Lasix lisinopril on hold.  Diarrhea chronic Teated for C. difficile multiple times in the  past; no diarrhea currently.  Dementia Pleasant communicative interactive.  At times mildly confused refusing care given Atarax overnight IV.  Continue oral Atarax as needed  Anxious prn Atarax   Chronic combined systolic and diastolic CHF volume status is stable Lasix remains on hold due to AKI resume upon discharge  Hypomagnesemia-cont mag ox po Hypoalbuminemia-encourage oral intake, augment nutrition Diabetes mellitus controlled, continue to monitor SSI.   Recent Labs  Lab 03/23/21 0950 03/23/21 1255 03/23/21 1715 03/23/21 2128 03/24/21 0809  GLUCAP 135* 147* 139* 158* 131*   Goals of care/CODE STATUS:currently DNR. Physical deconditioning: Continue PT OT, awaiting for skilled nursing facility no offers yet Addendum 17:45 PM RN concerend that he has had poor po intake- start RL 50 CC/HR ENCOURAGE PO INTAKE  DVT prophylaxis: Eliquis Code Status:   Code Status: DNR Family Communication: plan of care discussed with patient at bedside.  Status is: Inpatient Remains inpatient appropriate because: Ongoing management of deconditioning and care, pending placement Disposition: Currently is medically stable for discharge. Anticipated Disposition: snf once bed available.  Discussed during multidisciplinary round-Per social worker he will have a bed on Monday.   Total time spent in the care of this patient 25 MINUTES Objective: Vitals last 24 hrs: Vitals:   03/23/21 1726 03/23/21 2138 03/24/21 0615 03/24/21 0812  BP: (!) 147/124 (!) 133/98 (!) 138/108 (!) 154/70  Pulse: 75 72 76 79  Resp: 18 19 18 18   Temp: 98.3 F (36.8 C) 97.7 F (36.5 C) 97.8 F (36.6 C) 97.7 F (36.5 C)  TempSrc: Oral Oral Oral Oral  SpO2: 96% 97% 99% 99%  Weight:  Height:       Weight change:   Intake/Output Summary (Last 24 hours) at 03/24/2021 1029 Last data filed at 03/23/2021 1856 Gross per 24 hour  Intake 0 ml  Output 200 ml  Net -200 ml    Net IO Since Admission: -1,408.49 mL  [03/24/21 1029]  Physical Examination: General exam: AAOx 2-3 older than stated age, weak appearing. HEENT:Oral mucosa moist, Ear/Nose WNL grossly, dentition normal. Respiratory system: bilaterally clearno use of accessory muscle Cardiovascular system: S1 & S2 +, No JVD,. Gastrointestinal system: Abdomen soft, NT,ND, BS+ Nervous System:Alert, awake, moving extremities and grossly nonfocal Extremities: no edema, distal peripheral pulses palpable.  Skin: No rashes,no icterus. MSK: Normal muscle bulk,tone, power    Medications reviewed:  Scheduled Meds:  apixaban  5 mg Oral BID   insulin aspart  0-5 Units Subcutaneous QHS   insulin aspart  0-6 Units Subcutaneous TID WC   Continuous Infusions:  lactated ringers 50 mL/hr at 03/23/21 1823    Diet Order             Diet heart healthy/carb modified Room service appropriate? Yes; Fluid consistency: Thin  Diet effective now                 Weight change:   Wt Readings from Last 3 Encounters:  03/21/21 88.1 kg  04/05/19 96.2 kg  03/24/19 98.9 kg  Consultants:see note  Procedures:see note Antimicrobials: Anti-infectives (From admission, onward)    Start     Dose/Rate Route Frequency Ordered Stop   03/15/21 1000  remdesivir 100 mg in sodium chloride 0.9 % 100 mL IVPB       See Hyperspace for full Linked Orders Report.   100 mg 200 mL/hr over 30 Minutes Intravenous Daily 03/14/21 0143 03/16/21 1019   03/14/21 0300  remdesivir 200 mg in sodium chloride 0.9% 250 mL IVPB       See Hyperspace for full Linked Orders Report.   200 mg 580 mL/hr over 30 Minutes Intravenous Once 03/14/21 0143 03/14/21 0527     Culture/Microbiology    Component Value Date/Time   SDES BLOOD LEFT HAND 03/13/2021 2255   SPECREQUEST  03/13/2021 2255    BOTTLES DRAWN AEROBIC AND ANAEROBIC Blood Culture results may not be optimal due to an inadequate volume of blood received in culture bottles   CULT  03/13/2021 2255    NO GROWTH 5 DAYS Performed at  [facility], [place] [address], [place], [place] [address]    REPTSTATUS 03/18/2021 FINAL 03/13/2021 2255    Other culture-see note  Unresulted Labs (From admission, onward)    None     Data Reviewed: I have personally reviewed following labs and imaging studies CBC: No results for input(s): WBC, NEUTROABS, HGB, HCT, MCV, PLT in the last 168 hours.  Basic Metabolic Panel: Recent Labs  Lab 03/23/21 1053  NA 144  K 4.3  CL 107  CO2 27  GLUCOSE 158*  BUN 26*  CREATININE 1.28*  CALCIUM 9.2    GFR: Estimated Creatinine Clearance: 41.1 mL/min (A) (by C-G formula based on SCr of 1.28 mg/dL (H)). Liver Function Tests: No results for input(s): AST, ALT, ALKPHOS, BILITOT, PROT, ALBUMIN in the last 168 hours.  No results for input(s): LIPASE, AMYLASE in the last 168 hours.  No results for input(s): AMMONIA in the last 168 hours. Coagulation Profile: No results for input(s): INR, PROTIME in the last 168 hours.  Cardiac Enzymes: No results for input(s): CKTOTAL, CKMB, CKMBINDEX, TROPONINI  in the last 168 hours. BNP (last 3 results) No results for input(s): PROBNP in the last 8760 hours. HbA1C: No results for input(s): HGBA1C in the last 72 hours. CBG: Recent Labs  Lab 03/23/21 0950 03/23/21 1255 03/23/21 1715 03/23/21 2128 03/24/21 0809  GLUCAP 135* 147* 139* 158* 131*    Lipid Profile: No results for input(s): CHOL, HDL, LDLCALC, TRIG, CHOLHDL, LDLDIRECT in the last 72 hours. Thyroid Function Tests: No results for input(s): TSH, T4TOTAL, FREET4, T3FREE, THYROIDAB in the last 72 hours. Anemia Panel: No results for input(s): VITAMINB12, FOLATE, FERRITIN, TIBC, IRON, RETICCTPCT in the last 72 hours. Sepsis Labs: No results for input(s): PROCALCITON, LATICACIDVEN in the last 168 hours.   No results found for this or any previous visit (from the past 240 hour(s)).   Radiology Studies: No results found.   LOS: 9 days   Antonieta Pert, MD [facility]  03/24/2021, 10:29 AM

## 2021-03-25 LAB — GLUCOSE, CAPILLARY
Glucose-Capillary: 137 mg/dL — ABNORMAL HIGH (ref 70–99)
Glucose-Capillary: 129 mg/dL — ABNORMAL HIGH (ref 70–99)
Glucose-Capillary: 163 mg/dL — ABNORMAL HIGH (ref 70–99)
Glucose-Capillary: 150 mg/dL — ABNORMAL HIGH (ref 70–99)
Glucose-Capillary: 130 mg/dL — ABNORMAL HIGH (ref 70–99)

## 2021-03-25 NOTE — TOC Progression Note (Signed)
Transition of Care [facility]) - Progression Note    Patient Details  Name: Randall Odom MRN: [id number] Date of Birth: [birth year]  Transition of Care [facility]) CM/SW [place], [place] Phone Number: [phone] 03/25/2021, 9:15 AM  Clinical Narrative:    CSW started pt's authorization through [facility]. Navi reference # [id number].  TOC team will continue to assist with discharge planning needs.   Expected Discharge Plan: [place] Barriers to Discharge: Continued Medical Work up, Ship broker, SNF Covid  Expected Discharge Plan and Services Expected Discharge Plan: [place] arrangements for the past 2 months: Single Family Home                                       Social Determinants of Health (SDOH) Interventions    Readmission Risk Interventions No flowsheet data found.

## 2021-03-25 NOTE — Progress Notes (Signed)
PROGRESS NOTE    Randall Odom  JOI:[id number] DOB: 10/21/1926 DOA: 03/13/2021 PCP: Prince Solian, MD   Chief Complaint  Patient presents with   Fall  Brief Narrative/Hospital Course: Randall Odom, 88 y.o. male with PMH of afib, CAD, combined s/d CHF, CHB s/p pacer, DMII, DJD, diverticulosis, glaucoma, BPH, HTN, HLD who presented to the ER with worsening fatigue and falls.  He was described to be sleeping more at home and having had 4 mechanical falls prior to admission.  He also had developed a nonproductive cough with loose stools which the latter are described as chronic. On work-up in the ER he was found to be positive for COVID-19 and was admitted for treatment and further work-up.  Patient completed 3 days of remdesivir, was hydrated for AKI, at this time remains weak deconditioned and awaiting for skilled nursing facility Remains medically stable.  Patient completed 10 days of isolation  Subjective: Seen and examined this morning.  Alert awake fairly oriented not in distress, able to move all his extremities on command Patient has been refusing care at times.  No fever, blood sugar is stable  Remains on gentle IV fluids   Assessment & Plan:  COVID-19 virus infection: Tested positive on 1/3,High risk of severe disease s/p 3 days of remdesivir patient not hypoxic.Intermittent cough continue antitussive, supportive care.  Overall he remains stable.  Completed 10 days isolation.    Physical deconditioning/Debility continue PT OT awaiting for skilled nursing facility.  TOC working on placement-able to be discharged Monday.  AKI on CKD stage IIIa Baseline creatinine around 1.3, presented with 1.7 creatinine has returned to baseline.  Encourage oral intake placed on gentle IV hydration due to inconsistent intake can discontinue in the morning  Recent Labs  Lab 03/23/21 1053  BUN 26*  CREATININE 1.28*    Permanent atrial fibrillation rate controlled, continue home  Eliquis. CAD-continue Eliquis holding Lasix and lisinopril for now Essential hypertension BP well controlled, has been just-terazosin Lasix lisinopril have been discontinued.  Diarrhea chronic Teated for C. difficile multiple times in the past; no diarrhea currently.  Dementia Pleasant communicative interactive.  At times mildly confused refusing care given Atarax overnight IV.  Continue oral Atarax as needed  Anxious prn Atarax   Chronic combined systolic and diastolic CHF volume status is stable Lasix remains on hold due to AKI resume upon discharge  Hypomagnesemia-completed magnesium oxide Hypoalbuminemia-encourage oral intake, augment nutrition Diabetes mellitus controlled, continue to monitor SSI.   Recent Labs  Lab 03/24/21 0809 03/24/21 1148 03/24/21 1642 03/25/21 0519 03/25/21 0744  GLUCAP 131* 145* 141* 137* 129*  Goals of care/CODE STATUS:currently DNR. Physical deconditioning: Continue PT OT, awaiting for skilled nursing facility no offers yet  DVT prophylaxis: Eliquis Code Status:   Code Status: DNR Family Communication: plan of care discussed with patient at bedside.  Status is: Inpatient Remains inpatient appropriate because: Ongoing management of deconditioning and care, pending placement Disposition: Currently is medically stable for discharge. Anticipated Disposition: snf Monday    Total time spent in the care of this patient 25 MINUTES Objective: Vitals last 24 hrs: Vitals:   03/24/21 0615 03/24/21 0812 03/24/21 1621 03/25/21 0524  BP: (!) 138/108 (!) 154/70 (!) 145/60 (!) 119/58  Pulse: 76 79 69 70  Resp: 18 18 16 15   Temp: 97.8 F (36.6 C) 97.7 F (36.5 C) 97.9 F (36.6 C)   TempSrc: Oral Oral    SpO2: 99% 99% 100%   Weight:  Height:       Weight change:   Intake/Output Summary (Last 24 hours) at 03/25/2021 1008 Last data filed at 03/25/2021 0524 Gross per 24 hour  Intake 50 ml  Output 400 ml  Net -350 ml   Net IO Since Admission:  -1,758.49 mL [03/25/21 1008]  Physical Examination: General exam: AAOx2-3, elderly,weak appearing. HEENT:Oral mucosa moist, Ear/Nose WNL grossly, dentition normal. Respiratory system: bilaterally CLEAR, no use of accessory muscle Cardiovascular system: S1 & S2 +, No JVD,. Gastrointestinal system: Abdomen soft,NT,ND, BS+ Nervous System:Alert, awake, moving extremities and grossly nonfocal Extremities: no edema, distal peripheral pulses palpable.  Skin: No rashes,no icterus. MSK: Normal muscle bulk,tone, power   Medications reviewed:  Scheduled Meds:  apixaban  5 mg Oral BID   insulin aspart  0-5 Units Subcutaneous QHS   insulin aspart  0-6 Units Subcutaneous TID WC   Continuous Infusions:  lactated ringers 50 mL/hr at 03/24/21 1735    Diet Order             Diet heart healthy/carb modified Room service appropriate? Yes; Fluid consistency: Thin  Diet effective now                 Weight change:   Wt Readings from Last 3 Encounters:  03/21/21 88.1 kg  04/05/19 96.2 kg  03/24/19 98.9 kg  Consultants:see note  Procedures:see note Antimicrobials: Anti-infectives (From admission, onward)    Start     Dose/Rate Route Frequency Ordered Stop   03/15/21 1000  remdesivir 100 mg in sodium chloride 0.9 % 100 mL IVPB       See Hyperspace for full Linked Orders Report.   100 mg 200 mL/hr over 30 Minutes Intravenous Daily 03/14/21 0143 03/16/21 1019   03/14/21 0300  remdesivir 200 mg in sodium chloride 0.9% 250 mL IVPB       See Hyperspace for full Linked Orders Report.   200 mg 580 mL/hr over 30 Minutes Intravenous Once 03/14/21 0143 03/14/21 0527     Culture/Microbiology    Component Value Date/Time   SDES BLOOD LEFT HAND 03/13/2021 2255   SPECREQUEST  03/13/2021 2255    BOTTLES DRAWN AEROBIC AND ANAEROBIC Blood Culture results may not be optimal due to an inadequate volume of blood received in culture bottles   CULT  03/13/2021 2255    NO GROWTH 5 DAYS Performed at  [facility], [place] [address], [place], [place] [address]    REPTSTATUS 03/18/2021 FINAL 03/13/2021 2255    Other culture-see note  Unresulted Labs (From admission, onward)    None     Data Reviewed: I have personally reviewed following labs and imaging studies CBC: No results for input(s): WBC, NEUTROABS, HGB, HCT, MCV, PLT in the last 168 hours.  Basic Metabolic Panel: Recent Labs  Lab 03/23/21 1053  NA 144  K 4.3  CL 107  CO2 27  GLUCOSE 158*  BUN 26*  CREATININE 1.28*  CALCIUM 9.2   GFR: Estimated Creatinine Clearance: 41.1 mL/min (A) (by C-G formula based on SCr of 1.28 mg/dL (H)). Liver Function Tests: No results for input(s): AST, ALT, ALKPHOS, BILITOT, PROT, ALBUMIN in the last 168 hours.  No results for input(s): LIPASE, AMYLASE in the last 168 hours.  No results for input(s): AMMONIA in the last 168 hours. Coagulation Profile: No results for input(s): INR, PROTIME in the last 168 hours.  Cardiac Enzymes: No results for input(s): CKTOTAL, CKMB, CKMBINDEX, TROPONINI in the last 168 hours. BNP (last 3  results) No results for input(s): PROBNP in the last 8760 hours. HbA1C: No results for input(s): HGBA1C in the last 72 hours. CBG: Recent Labs  Lab 03/24/21 0809 03/24/21 1148 03/24/21 1642 03/25/21 0519 03/25/21 0744  GLUCAP 131* 145* 141* 137* 129*   Lipid Profile: No results for input(s): CHOL, HDL, LDLCALC, TRIG, CHOLHDL, LDLDIRECT in the last 72 hours. Thyroid Function Tests: No results for input(s): TSH, T4TOTAL, FREET4, T3FREE, THYROIDAB in the last 72 hours. Anemia Panel: No results for input(s): VITAMINB12, FOLATE, FERRITIN, TIBC, IRON, RETICCTPCT in the last 72 hours. Sepsis Labs: No results for input(s): PROCALCITON, LATICACIDVEN in the last 168 hours.   No results found for this or any previous visit (from the past 240 hour(s)).   Radiology Studies: No results found.   LOS: 10 days   Antonieta Pert, MD [facility]  03/25/2021, 10:08 AM

## 2021-03-26 LAB — GLUCOSE, CAPILLARY
Glucose-Capillary: 130 mg/dL — ABNORMAL HIGH (ref 70–99)
Glucose-Capillary: 157 mg/dL — ABNORMAL HIGH (ref 70–99)

## 2021-03-26 MED ORDER — GUAIFENESIN-DM 100-10 MG/5ML PO SYRP: 5.0000 mL | ORAL_SOLUTION | ORAL | 0 refills | Status: AC | PRN

## 2021-03-26 MED ORDER — FUROSEMIDE 40 MG PO TABS: 20.0000 mg | ORAL_TABLET | ORAL | 0 refills | Status: DC

## 2021-03-26 NOTE — Progress Notes (Signed)
Physical Therapy Treatment Patient Details Name: Randall Odom MRN: [id number] DOB: [birth year] Today's Date: 03/26/2021   History of Present Illness 88 y/o male admitted secondary to weakness and multiple falls. COVID-19 positive. PMH includes dementia, HTN, CHF, CAD, a fib, and s/p pacemaker.    PT Comments    Received pt slouched down in bed. Pt required cues for orientation as pt thought his car was parked in his room. Pt required maximal encouragement to participate and c/o "not feeling good" during session. Pt performed bed mobility with mod A and cues for sequencing and able to progress to standing with RW from elevated EOB with max A of 1 today. Pt unable to stand >10 seconds and immediately requesting to lie back down. Noted breakfast tray untouched, therefore set pt up to eat and assisted pt to initiate feeding. Acute PT to cont to follow.     Recommendations for follow up therapy are one component of a multi-disciplinary discharge planning process, led by the attending physician.  Recommendations may be updated based on patient status, additional functional criteria and insurance authorization.  Follow Up Recommendations  Skilled nursing-short term rehab (<3 hours/day)     Assistance Recommended at Discharge Frequent or constant Supervision/Assistance  Patient can return home with the following A lot of help with walking and/or transfers;A lot of help with bathing/dressing/bathroom;Help with stairs or ramp for entrance;Direct supervision/assist for financial management;Direct supervision/assist for medications management;Assist for transportation;Assistance with cooking/housework   Equipment Recommendations  Wheelchair (measurements PT);Wheelchair cushion (measurements PT)    Recommendations for Other Services       Precautions / Restrictions Precautions Precautions: Fall Restrictions Weight Bearing Restrictions: No     Mobility  Bed Mobility Overal bed mobility: Needs  Assistance Bed Mobility: Supine to Sit;Sit to Supine;Rolling Rolling: Min assist   Supine to sit: Mod assist Sit to supine: Mod assist   General bed mobility comments: HOB elevated and use of bedrails. Pt required increased time and max cues for sequencing to sit EOB. Required assist to initate getting LEs to EOB    Transfers Overall transfer level: Needs assistance Equipment used: Rolling walker (2 wheels) Transfers: Sit to/from Stand Sit to Stand: Max assist           General transfer comment: Pt stood from elevated EOB using RW and max A with maximal enocuragement. Poor carry over with cues not to pull up on RW. Pt reported multiple times "not feeling good" and as soon as pt stood stated "I need to sit down now".    Ambulation/Gait                   Stairs             Wheelchair Mobility    Modified Rankin (Stroke Patients Only)       Balance Overall balance assessment: Needs assistance Sitting-balance support: Bilateral upper extremity supported;Feet supported Sitting balance-Leahy Scale: Fair     Standing balance support: Bilateral upper extremity supported (RW) Standing balance-Leahy Scale: Poor Standing balance comment: Pt unable to stand >10 seconds despite encouragement                            Cognition Arousal/Alertness: Awake/alert   Overall Cognitive Status: History of cognitive impairments - at baseline  General Comments: pt confused to current situation requring cues for redirection but was able to reacall that he was going to rehab        Exercises      General Comments        Pertinent Vitals/Pain Pain Assessment: Faces Faces Pain Scale: Hurts a little bit Pain Location: RLE Pain Descriptors / Indicators: Moaning;Grimacing;Guarding Pain Intervention(s): Limited activity within patient's tolerance;Monitored during session;Repositioned    Home Living                           Prior Function            PT Goals (current goals can now be found in the care plan section) Acute Rehab PT Goals Patient Stated Goal: to see his wife PT Goal Formulation: With patient Time For Goal Achievement: 03/28/21 Potential to Achieve Goals: Good Progress towards PT goals: Progressing toward goals    Frequency    Min 2X/week      PT Plan Current plan remains appropriate    Co-evaluation              AM-PAC PT "6 Clicks" Mobility   Outcome Measure  Help needed turning from your back to your side while in a flat bed without using bedrails?: A Little Help needed moving from lying on your back to sitting on the side of a flat bed without using bedrails?: A Lot Help needed moving to and from a bed to a chair (including a wheelchair)?: A Lot Help needed standing up from a chair using your arms (e.g., wheelchair or bedside chair)?: A Lot Help needed to walk in hospital room?: A Lot Help needed climbing 3-5 steps with a railing? : Total 6 Click Score: 12    End of Session Equipment Utilized During Treatment: Gait belt Activity Tolerance: Patient limited by fatigue;Other (comment) (limited by "not feeling good") Patient left: in bed;with call bell/phone within reach;with bed alarm set Nurse Communication: Mobility status PT Visit Diagnosis: Unsteadiness on feet (R26.81);Muscle weakness (generalized) (M62.81);History of falling (Z91.81);Repeated falls (R29.6);Other abnormalities of gait and mobility (R26.89);Pain Pain - Right/Left: Right Pain - part of body: Leg     Time: 1157-2620 PT Time Calculation (min) (ACUTE ONLY): 14 min  Charges:  $Therapeutic Activity: 8-22 mins                     Becky Sax PT, DPT  Blenda Nicely 03/26/2021, 11:55 AM

## 2021-03-26 NOTE — Plan of Care (Signed)

## 2021-03-26 NOTE — Progress Notes (Signed)
[facility] Liaison Note  Notified by TOC/Miranda of patient/family request of [facility] Paliative services.  [facility] hospital liaison will follow patient for discharge disposition.   Please call with any questions/concerns.    Thank you for the opportunity to participate in this patient's care.   Daphene Calamity, MSW [facility] Liaison  [phone]

## 2021-03-26 NOTE — Discharge Summary (Signed)
Physician Discharge Summary  Randall Odom TFT:[id number] DOB: 03/02/1927 DOA: 03/13/2021  PCP: Prince Solian, MD  Admit date: 03/13/2021 Discharge date: 03/26/2021  Admitted From: home Disposition:  SNF  Recommendations for Outpatient Follow-up:  Follow up with PCP in 1-2 weeks Please obtain BMP/CBC in one week Please involve palliative care at the facility  Home Health:NO  Equipment/Devices: NONE  Discharge Condition: Stable Code Status:   Code Status: DNR Diet recommendation:  Diet Order             Diet heart healthy/carb modified Room service appropriate? Yes; Fluid consistency: Thin  Diet effective now                 Brief/Interim Summary:88 y.o. male with PMH of afib, CAD, combined s/d CHF, CHB s/p pacer, DMII, DJD, diverticulosis, glaucoma, BPH, HTN, HLD who presented to the ER with worsening fatigue and falls.  He was described to be sleeping more at home and having had 4 mechanical falls prior to admission.  He also had developed a nonproductive cough with loose stools which the latter are described as chronic. On work-up in the ER he was found to be positive for COVID-19 and was admitted for treatment and further work-up.  Patient completed 3 days of remdesivir, was hydrated for AKI, at this time remains weak deconditioned and awaiting for skilled nursing facility. Remains medically stable.  Patient completed 10 days of isolation Per family request I would like to request skilled nursing facility involved palliative care at the facility.  Discharge Diagnoses:   COVID-19 virus infection: Tested positive on 1/3,High risk of severe disease s/p 3 days of remdesivir patient not hypoxic.Intermittent cough continue antitussive, supportive care.  Overall he remains stable.  Completed 10 days isolation.     Physical deconditioning/Debility continue PT OT awaiting for skilled nursing facility.  TOC working on placement-able to be discharged Monday.   AKI on CKD stage IIIa  Baseline creatinine around 1.3, presented with 1.7 creatinine has returned to baseline. Encourage oral intake placed on gentle IV hydration due to inconsistent intake can discontinue in the morning   Permanent atrial fibrillation rate controlled, continue home Eliquis. CAD-continue Eliquis holding Lasix and lisinopril for now Essential hypertension BP well controlled, has been just-terazosin Lasix lisinopril have been discontinued.   Diarrhea chronic:Teated for C. difficile multiple times in the past;no diarrhea currently.   Dementia Pleasant communicative interactive.  At times mildly confused refusing care given Atarax overnight IV.  Continue oral Atarax as needed Anxious prn Atarax  Chronic combined systolic and diastolic CHF volume status is stable Lasix remains on hold due to AKI resume upon discharge Hypomagnesemia-completed magnesium oxide Hypoalbuminemia-encourage oral intake, augment nutrition Diabetes mellitus controlled, continue to monitor SSI.   goals of care/CODE STATUS:currently DNR. Physical deconditioning: Continue PT OT, awaiting for skilled nursing facility   Consults: none  Subjective: Seen and examined this morning, patient interested to have rehab, is also concerned about missing home Alert awake no new complaints.  Discharge Exam: Vitals:   03/26/21 0501 03/26/21 0731  BP: (!) 142/73 (!) 141/84  Pulse: 76 75  Resp: 19 16  Temp: 97.9 F (36.6 C) (!) 97.4 F (36.3 C)  SpO2: 98% 100%   General: Pt is alert, awake, not in acute distress Cardiovascular: RRR, S1/S2 +, no rubs, no gallops Respiratory: CTA bilaterally, no wheezing, no rhonchi Abdominal: Soft, NT, ND, bowel sounds + Extremities: no edema, no cyanosis  Discharge Instructions  Discharge Instructions     (HEART  FAILURE PATIENTS) Call MD:  Anytime you have any of the following symptoms: 1) 3 pound weight gain in 24 hours or 5 pounds in 1 week 2) shortness of breath, with or without a dry hacking  cough 3) swelling in the hands, feet or stomach 4) if you have to sleep on extra pillows at night in order to breathe.   Complete by: As directed    Discharge instructions   Complete by: As directed    Cbc bmp I n1 wk  Please call call MD or return to ER for similar or worsening recurring problem that brought you to hospital or if any fever,nausea/vomiting,abdominal pain, uncontrolled pain, chest pain,  shortness of breath or any other alarming symptoms.  Please follow-up your doctor as instructed in a week time and call the office for appointment.  Please avoid alcohol, smoking, or any other illicit substance and maintain healthy habits including taking your regular medications as prescribed.  You were cared for by a hospitalist during your hospital stay. If you have any questions about your discharge medications or the care you received while you were in the hospital after you are discharged, you can call the unit and ask to speak with the hospitalist on call if the hospitalist that took care of you is not available.  Once you are discharged, your primary care physician will handle any further medical issues. Please note that NO REFILLS for any discharge medications will be authorized once you are discharged, as it is imperative that you return to your primary care physician (or establish a relationship with a primary care physician if you do not have one) for your aftercare needs so that they can reassess your need for medications and monitor your lab values   Increase activity slowly   Complete by: As directed       Allergies as of 03/26/2021       Reactions   Spironolactone Diarrhea, Nausea And Vomiting   Ciprofloxacin Other (See Comments)   C-diff        Medication List     STOP taking these medications    glipiZIDE 5 MG tablet Commonly known as: GLUCOTROL   lisinopril 5 MG tablet Commonly known as: ZESTRIL       TAKE these medications    Eliquis 5 MG Tabs  tablet Generic drug: apixaban TAKE 1 TABLET BY MOUTH TWICE A DAY What changed: how much to take   furosemide 40 MG tablet Commonly known as: LASIX Take 0.5 tablets (20 mg total) by mouth every other day. What changed:  how much to take when to take this   guaiFENesin-dextromethorphan 100-10 MG/5ML syrup Commonly known as: ROBITUSSIN DM Take 5 mLs by mouth every 4 (four) hours as needed for cough.   multivitamin with minerals Tabs tablet Take 1 tablet by mouth daily.   Saw Palmetto 450 MG Caps Take 450 mg by mouth daily.   simvastatin 40 MG tablet Commonly known as: ZOCOR Take 1 tablet (40 mg total) by mouth at bedtime.   terazosin 5 MG capsule Commonly known as: HYTRIN Take 1 capsule (5 mg total) by mouth at bedtime.        Follow-up Information     Avva, Ravisankar, MD Follow up in 1 week(s).   Specialty: Internal Medicine Contact information: [place] [address] [phone]         Belva Crome, MD .   Specialty: Cardiology Contact information: [id number]  [place] [address] [phone]         Thompson Grayer, MD .   Specialty: Cardiology Contact information: [address] [place] [address] [phone]                Allergies  Allergen Reactions   Spironolactone Diarrhea and Nausea And Vomiting   Ciprofloxacin Other (See Comments)    C-diff    The results of significant diagnostics from this hospitalization (including imaging, microbiology, ancillary and laboratory) are listed below for reference.    Microbiology: No results found for this or any previous visit (from the past 240 hour(s)).  Procedures/Studies: CT Head Wo Contrast  Result Date: 03/13/2021 CLINICAL DATA:  [place], trauma EXAM: CT HEAD WITHOUT CONTRAST TECHNIQUE: Contiguous axial images were obtained from the base of the skull through the vertex without intravenous contrast. COMPARISON:  05/06/2011 FINDINGS:  Brain: Chronic small vessel ischemic changes are seen within the bilateral insula and external capsule. No acute infarct or hemorrhage. Lateral ventricles and midline structures are otherwise unremarkable. No acute extra-axial fluid collections. No mass effect. Vascular: Dense atherosclerosis of the internal carotid arteries. No hyperdense vessel. Skull: Normal. Negative for fracture or focal lesion. Sinuses/Orbits: No acute finding. Other: None. IMPRESSION: 1. No acute intracranial process. Electronically Signed   By: Randa Ngo M.D.   On: 03/13/2021 21:32   CT Cervical Spine Wo Contrast  Result Date: 03/13/2021 CLINICAL DATA:  [place], trauma EXAM: CT CERVICAL SPINE WITHOUT CONTRAST TECHNIQUE: Multidetector CT imaging of the cervical spine was performed without intravenous contrast. Multiplanar CT image reconstructions were also generated. COMPARISON:  None. FINDINGS: Alignment: Alignment is grossly anatomic. Skull base and vertebrae: No acute fracture. No primary bone lesion or focal pathologic process. Soft tissues and spinal canal: No prevertebral fluid or swelling. No visible canal hematoma. Marked atherosclerosis at the carotid bifurcations. Disc levels: Mild spondylosis at C5-6 and C6-7. Mild diffuse facet hypertrophy. Upper chest: Central airways are patent. Emphysematous changes at the lung apices. Other: Reconstructed images demonstrate no additional findings. IMPRESSION: 1. No acute cervical spine fracture. 2. Mild spondylosis and facet hypertrophy. 3. Emphysema. Electronically Signed   By: Randa Ngo M.D.   On: 03/13/2021 21:41   DG Chest Portable 1 View  Result Date: 03/13/2021 CLINICAL DATA:  Weakness, fall. EXAM: PORTABLE CHEST 1 VIEW COMPARISON:  Chest x-ray 09/10/2016. FINDINGS: Right-sided pacemaker is again seen. The aorta is tortuous with atherosclerotic calcifications. The heart is mildly enlarged, unchanged. There is some minimal strandy opacities in the left lung base. There is no  pleural effusion or pneumothorax identified. No acute fractures are seen. IMPRESSION: 1. Minimal left basilar atelectasis or scarring. 2. Stable cardiomegaly. Electronically Signed   By: Ronney Asters M.D.   On: 03/13/2021 20:18    Labs: BNP (last 3 results) No results for input(s): BNP in the last 8760 hours. Basic Metabolic Panel: Recent Labs  Lab 03/23/21 1053  NA 144  K 4.3  CL 107  CO2 27  GLUCOSE 158*  BUN 26*  CREATININE 1.28*  CALCIUM 9.2   Liver Function Tests: No results for input(s): AST, ALT, ALKPHOS, BILITOT, PROT, ALBUMIN in the last 168 hours. No results for input(s): LIPASE, AMYLASE in the last 168 hours. No results for input(s): AMMONIA in the last 168 hours. CBC: No results for input(s): WBC, NEUTROABS, HGB, HCT, MCV, PLT in the last 168 hours. Cardiac Enzymes: No results for input(s): CKTOTAL, CKMB, CKMBINDEX, TROPONINI in the last 168 hours. BNP: Invalid input(s): POCBNP  CBG: Recent Labs  Lab 03/25/21 0744 03/25/21 1136 03/25/21 1659 03/25/21 2130 03/26/21 0801  GLUCAP 129* 163* 150* 130* 130*   D-Dimer No results for input(s): DDIMER in the last 72 hours. Hgb A1c No results for input(s): HGBA1C in the last 72 hours. Lipid Profile No results for input(s): CHOL, HDL, LDLCALC, TRIG, CHOLHDL, LDLDIRECT in the last 72 hours. Thyroid function studies No results for input(s): TSH, T4TOTAL, T3FREE, THYROIDAB in the last 72 hours.  Invalid input(s): FREET3 Anemia work up No results for input(s): VITAMINB12, FOLATE, FERRITIN, TIBC, IRON, RETICCTPCT in the last 72 hours. Urinalysis    Component Value Date/Time   COLORURINE YELLOW 03/13/2021 1919   APPEARANCEUR CLOUDY (A) 03/13/2021 1919   LABSPEC 1.020 03/13/2021 1919   PHURINE 6.0 03/13/2021 1919   GLUCOSEU NEGATIVE 03/13/2021 1919   HGBUR LARGE (A) 03/13/2021 1919   BILIRUBINUR NEGATIVE 03/13/2021 1919   KETONESUR NEGATIVE 03/13/2021 1919   PROTEINUR NEGATIVE 03/13/2021 1919   UROBILINOGEN 0.2  05/26/2012 0623   NITRITE NEGATIVE 03/13/2021 1919   LEUKOCYTESUR SMALL (A) 03/13/2021 1919   Sepsis Labs Invalid input(s): PROCALCITONIN,  WBC,  LACTICIDVEN Microbiology No results found for this or any previous visit (from the past 240 hour(s)).   Time coordinating discharge: 35 minutes  SIGNED: Antonieta Pert, MD  [facility] 03/26/2021, 10:38 AM  If 7PM-7AM, please contact night-coverage [email or web address]

## 2021-03-26 NOTE — TOC Transition Note (Signed)
Transition of Care [facility]) - CM/SW Discharge Note   Patient Details  Name: Randall Odom MRN: [id number] Date of Birth: 01-25-1927  Transition of Care [facility]) CM/SW Contact:  Emeterio Reeve, LCSW Phone Number: 03/26/2021, 12:54 PM   Clinical Narrative:      Per MD patient ready for DC to [facility]. RN, patient, patient's family, and facility notified of DC. Discharge Summary and FL2 sent to facility. DC packet on chart. Insurance Josem Kaufmann has been received.. Ambulance transport requested for patient.    RN to call report to [phone]. Room 3242.  CSW will sign off for now as social work intervention is no longer needed. Please consult [place] again if new needs arise.   Final next level of care: Skilled Nursing Facility Barriers to Discharge: Barriers Resolved   Patient Goals and CMS Choice Patient states their goals for this hospitalization and ongoing recovery are:: Pt unable to participate in goal setting CMS Medicare.gov Compare Post Acute Care list provided to:: Patient Choice offered to / list presented to : Patient  Discharge Placement              Patient chooses bed at: [facility] Patient to be transferred to facility by: ptar Name of family member notified: Son, Ray Patient and family notified of of transfer: 03/26/21  Discharge Plan and Services                                     Social Determinants of Health (SDOH) Interventions     Readmission Risk Interventions No flowsheet data found.   Emeterio Reeve, LCSW Clinical Social Worker

## 2021-04-12 ENCOUNTER — Other Ambulatory Visit: Payer: Self-pay

## 2021-04-12 ENCOUNTER — Inpatient Hospital Stay (HOSPITAL_COMMUNITY)
Admission: EM | Admit: 2021-04-12 | Discharge: 2021-04-13 | DRG: 951 | Disposition: A | Discharge status: Skilled Nursing Facility | Payer: Medicare Other | Source: Skilled Nursing Facility | Attending: Internal Medicine | Admitting: Internal Medicine

## 2021-04-12 ENCOUNTER — Encounter (HOSPITAL_COMMUNITY): Payer: Self-pay

## 2021-04-12 DIAGNOSIS — I130 Hypertensive heart and chronic kidney disease with heart failure and stage 1 through stage 4 chronic kidney disease, or unspecified chronic kidney disease: Secondary | ICD-10-CM | POA: Diagnosis present

## 2021-04-12 DIAGNOSIS — Z803 Family history of malignant neoplasm of breast: Secondary | ICD-10-CM

## 2021-04-12 DIAGNOSIS — N1831 Chronic kidney disease, stage 3a: Secondary | ICD-10-CM

## 2021-04-12 DIAGNOSIS — I2510 Atherosclerotic heart disease of native coronary artery without angina pectoris: Secondary | ICD-10-CM | POA: Diagnosis present

## 2021-04-12 DIAGNOSIS — Z7901 Long term (current) use of anticoagulants: Secondary | ICD-10-CM | POA: Diagnosis not present

## 2021-04-12 DIAGNOSIS — F0390 Unspecified dementia without behavioral disturbance: Secondary | ICD-10-CM | POA: Diagnosis not present

## 2021-04-12 DIAGNOSIS — I4821 Permanent atrial fibrillation: Secondary | ICD-10-CM | POA: Diagnosis present

## 2021-04-12 DIAGNOSIS — F419 Anxiety disorder, unspecified: Secondary | ICD-10-CM | POA: Diagnosis present

## 2021-04-12 DIAGNOSIS — N400 Enlarged prostate without lower urinary tract symptoms: Secondary | ICD-10-CM | POA: Diagnosis present

## 2021-04-12 DIAGNOSIS — Z515 Encounter for palliative care: Secondary | ICD-10-CM | POA: Diagnosis present

## 2021-04-12 DIAGNOSIS — F32A Depression, unspecified: Secondary | ICD-10-CM | POA: Diagnosis present

## 2021-04-12 DIAGNOSIS — I5042 Chronic combined systolic (congestive) and diastolic (congestive) heart failure: Secondary | ICD-10-CM | POA: Diagnosis present

## 2021-04-12 DIAGNOSIS — Z888 Allergy status to other drugs, medicaments and biological substances status: Secondary | ICD-10-CM

## 2021-04-12 DIAGNOSIS — E119 Type 2 diabetes mellitus without complications: Secondary | ICD-10-CM

## 2021-04-12 DIAGNOSIS — Z66 Do not resuscitate: Secondary | ICD-10-CM | POA: Diagnosis present

## 2021-04-12 DIAGNOSIS — E7800 Pure hypercholesterolemia, unspecified: Secondary | ICD-10-CM | POA: Diagnosis present

## 2021-04-12 DIAGNOSIS — Z950 Presence of cardiac pacemaker: Secondary | ICD-10-CM | POA: Diagnosis not present

## 2021-04-12 DIAGNOSIS — I442 Atrioventricular block, complete: Secondary | ICD-10-CM | POA: Diagnosis present

## 2021-04-12 DIAGNOSIS — W06XXXA Fall from bed, initial encounter: Secondary | ICD-10-CM | POA: Diagnosis present

## 2021-04-12 DIAGNOSIS — Z87891 Personal history of nicotine dependence: Secondary | ICD-10-CM

## 2021-04-12 DIAGNOSIS — F0392 Unspecified dementia, unspecified severity, with psychotic disturbance: Secondary | ICD-10-CM | POA: Diagnosis present

## 2021-04-12 DIAGNOSIS — K922 Gastrointestinal hemorrhage, unspecified: Secondary | ICD-10-CM

## 2021-04-12 DIAGNOSIS — Y92129 Unspecified place in nursing home as the place of occurrence of the external cause: Secondary | ICD-10-CM | POA: Diagnosis not present

## 2021-04-12 DIAGNOSIS — E1122 Type 2 diabetes mellitus with diabetic chronic kidney disease: Secondary | ICD-10-CM | POA: Diagnosis present

## 2021-04-12 DIAGNOSIS — Z20822 Contact with and (suspected) exposure to covid-19: Secondary | ICD-10-CM | POA: Diagnosis present

## 2021-04-12 DIAGNOSIS — Z8249 Family history of ischemic heart disease and other diseases of the circulatory system: Secondary | ICD-10-CM | POA: Diagnosis not present

## 2021-04-12 DIAGNOSIS — K625 Hemorrhage of anus and rectum: Secondary | ICD-10-CM

## 2021-04-12 DIAGNOSIS — Z823 Family history of stroke: Secondary | ICD-10-CM

## 2021-04-12 DIAGNOSIS — Z79899 Other long term (current) drug therapy: Secondary | ICD-10-CM

## 2021-04-12 DIAGNOSIS — Z8616 Personal history of COVID-19: Secondary | ICD-10-CM

## 2021-04-12 LAB — RESP PANEL BY RT-PCR (FLU A&B, COVID) ARPGX2
Influenza A by PCR: NEGATIVE
Influenza B by PCR: NEGATIVE
SARS Coronavirus 2 by RT PCR: NEGATIVE

## 2021-04-12 LAB — POC OCCULT BLOOD, ED: Fecal Occult Bld: POSITIVE — AB

## 2021-04-12 MED ORDER — LORAZEPAM 2 MG/ML PO CONC: 1.0000 mg | ORAL | Status: DC | PRN

## 2021-04-12 MED ORDER — BIOTENE DRY MOUTH MT LIQD: 15.0000 mL | OROMUCOSAL | Status: DC | PRN

## 2021-04-12 MED ORDER — HALOPERIDOL LACTATE 2 MG/ML PO CONC
0.5000 mg | ORAL | Status: DC | PRN
  Filled 2021-04-12: qty 0.3

## 2021-04-12 MED ORDER — LORAZEPAM 2 MG/ML IJ SOLN: 1.0000 mg | INTRAMUSCULAR | Status: DC | PRN

## 2021-04-12 MED ORDER — GLYCOPYRROLATE 1 MG PO TABS
1.0000 mg | ORAL_TABLET | ORAL | Status: DC | PRN
  Filled 2021-04-12: qty 1

## 2021-04-12 MED ORDER — HALOPERIDOL LACTATE 5 MG/ML IJ SOLN: 0.5000 mg | INTRAMUSCULAR | Status: DC | PRN

## 2021-04-12 MED ORDER — ONDANSETRON 4 MG PO TBDP: 4.0000 mg | ORAL_TABLET | Freq: Four times a day (QID) | ORAL | Status: DC | PRN

## 2021-04-12 MED ORDER — ONDANSETRON HCL 4 MG/2ML IJ SOLN: 4.0000 mg | Freq: Four times a day (QID) | INTRAMUSCULAR | Status: DC | PRN

## 2021-04-12 MED ORDER — GLYCOPYRROLATE 0.2 MG/ML IJ SOLN: 0.2000 mg | INTRAMUSCULAR | Status: DC | PRN

## 2021-04-12 MED ORDER — LORAZEPAM 1 MG PO TABS: 1.0000 mg | ORAL_TABLET | ORAL | Status: DC | PRN

## 2021-04-12 MED ORDER — MORPHINE SULFATE (PF) 2 MG/ML IV SOLN
2.0000 mg | INTRAVENOUS | Status: DC | PRN
  Administered 2021-04-13: 2 mg via INTRAVENOUS
  Filled 2021-04-12: qty 1

## 2021-04-12 MED ORDER — HALOPERIDOL 0.5 MG PO TABS
0.5000 mg | ORAL_TABLET | ORAL | Status: DC | PRN
  Filled 2021-04-12: qty 1

## 2021-04-12 MED ORDER — SODIUM CHLORIDE 0.9 % BOLUS PEDS: 1000.0000 mL | Freq: Once | INTRAVENOUS | Status: DC

## 2021-04-12 MED ORDER — POLYVINYL ALCOHOL 1.4 % OP SOLN
1.0000 [drp] | Freq: Four times a day (QID) | OPHTHALMIC | Status: DC | PRN
  Filled 2021-04-12: qty 15

## 2021-04-12 MED ORDER — MORPHINE SULFATE (PF) 4 MG/ML IV SOLN: 4.0000 mg | INTRAVENOUS | Status: DC | PRN

## 2021-04-12 MED ORDER — ACETAMINOPHEN 325 MG PO TABS: 650.0000 mg | ORAL_TABLET | Freq: Four times a day (QID) | ORAL | Status: DC | PRN

## 2021-04-12 MED ORDER — ACETAMINOPHEN 650 MG RE SUPP: 650.0000 mg | Freq: Four times a day (QID) | RECTAL | Status: DC | PRN

## 2021-04-12 NOTE — Assessment & Plan Note (Addendum)
Son does not desire any further work-up for this.  Anticoagulation discontinued.

## 2021-04-12 NOTE — Discharge Planning (Signed)
RNCM placed residential hospice referral to  [place] with [facility].

## 2021-04-12 NOTE — Assessment & Plan Note (Signed)
Chronic. 

## 2021-04-12 NOTE — Assessment & Plan Note (Deleted)
Chronic. 

## 2021-04-12 NOTE — Progress Notes (Addendum)
[facility] PROGRESS NOTE   [name] GEX:[id number] DOB: 1927-01-10 DOA: 04/12/2021  0 DOS: the patient was seen and examined on 04/12/2021  PCP: Randall Low, MD  Brief History and Hospital Course:  88 year old male with a history of A. fib, status post pacemaker, dementia, coronary disease, diabetes, CKD stage IIIa who was recently discharged to [facility] on 03/26/2021 after spending about 2 weeks in the hospital for [place]. Son states the patient has been steadily declining for the last year and a half. On the day of admission patient had fallen out of bed.  He was noted to have a diaper full of blood clots.  Due to his declining condition son did not want any aggressive testing and wanted to pursue a more comfort care strategy.  He was interested in hospice.  Patient was transition to comfort care.  Consultants: None  Procedures: None    Subjective: Patient unresponsive.    Assessment/Plan:  Assessment and Plan: * Admission for terminal care Admitting provider discussed with patient's son Randall Odom who is the patient's healthcare power of attorney.  Comfort care strategy being pursued.  Patient seems to be stable.  Not very responsive.  Quite possible he may remain stable for several days.  It appears that son was interested in patient going back to [facility] with hospice care.  We will pursue that or residential hospice placement.  Involve TOC. Life expectancy is less than 2 weeks.  GI bleeding Son does not desire any further work-up for this.  Anticoagulation discontinued.  Diabetes mellitus ([place]) All medications were discontinued.  Pacemaker- (present on admission) Patient has a pacemaker.   CAD (coronary artery disease)- (present on admission) Medications discontinued.  Permanent atrial fibrillation ([place])- (present on admission) Chronic.  Chronic combined systolic and diastolic heart failure ([place])- (present on admission) Comfort  care  Dementia [facility])- (present on admission) Has had progressively worsening dementia.     DVT Prophylaxis: Comfort care Code Status: DNR Family Communication: No family at bedside.  Will update son later today. Disposition Plan: Back to his nursing facility with hospice or to residential hospice.  Status is: Inpatient Remains inpatient appropriate because: Comfort care.             Medications: Scheduled: Continuous: WNU:[id number] **OR** acetaminophen, antiseptic oral rinse, glycopyrrolate **OR** glycopyrrolate **OR** glycopyrrolate, haloperidol **OR** haloperidol **OR** haloperidol lactate, LORazepam **OR** [DISCONTINUED] LORazepam **OR** LORazepam, morphine injection, ondansetron **OR** ondansetron (ZOFRAN) IV, polyvinyl alcohol  Antibiotics: Anti-infectives (From admission, onward)    None       Objective:  Vital Signs  Vitals:   04/12/21 0815 04/12/21 0830 04/12/21 0845 04/12/21 1158  BP:    (!) 107/50  Pulse: 70 77 73 74  Resp: 13 16 17 17   Temp:      TempSrc:      SpO2: 100% 96% 97% 97%   No intake or output data in the 24 hours ending 04/12/21 1402 There were no vitals filed for this visit.  Noted to be unresponsive.  Noted to be moving around.  S1-S2 is normal regular.   Lab Results:    Recent Results (from the past 240 hour(s))  Resp Panel by RT-PCR (Flu A&B, Covid) Nasopharyngeal Swab     Status: None   Collection Time: 04/12/21  5:34 AM   Specimen: Nasopharyngeal Swab; Nasopharyngeal(NP) swabs in vial transport medium  Result Value Ref Range Status   SARS Coronavirus 2 by RT PCR NEGATIVE NEGATIVE Final    Comment: (NOTE) SARS-CoV-2  target nucleic acids are NOT DETECTED.  The SARS-CoV-2 RNA is generally detectable in upper respiratory specimens during the acute phase of infection. The lowest concentration of SARS-CoV-2 viral copies this assay can detect is 138 copies/mL. A negative result does not preclude SARS-Cov-2 infection  and should not be used as the sole basis for treatment or other patient management decisions. A negative result may occur with  improper specimen collection/handling, submission of specimen other than nasopharyngeal swab, presence of viral mutation(s) within the areas targeted by this assay, and inadequate number of viral copies(<138 copies/mL). A negative result must be combined with clinical observations, patient history, and epidemiological information. The expected result is Negative.  Fact Sheet for Patients:  EntrepreneurPulse.com.au  Fact Sheet for Healthcare Providers:  IncredibleEmployment.be  This test is no t yet approved or cleared by the [place] FDA and  has been authorized for detection and/or diagnosis of SARS-CoV-2 by FDA under an Emergency Use Authorization (EUA). This EUA will remain  in effect (meaning this test can be used) for the duration of the COVID-19 declaration under Section 564(b)(1) of the Act, 21 U.S.C.section 360bbb-3(b)(1), unless the authorization is terminated  or revoked sooner.       Influenza A by PCR NEGATIVE NEGATIVE Final   Influenza B by PCR NEGATIVE NEGATIVE Final    Comment: (NOTE) The Xpert Xpress SARS-CoV-2/FLU/RSV plus assay is intended as an aid in the diagnosis of influenza from Nasopharyngeal swab specimens and should not be used as a sole basis for treatment. Nasal washings and aspirates are unacceptable for Xpert Xpress SARS-CoV-2/FLU/RSV testing.  Fact Sheet for Patients: EntrepreneurPulse.com.au  Fact Sheet for Healthcare Providers: IncredibleEmployment.be  This test is not yet approved or cleared by the [place] FDA and has been authorized for detection and/or diagnosis of SARS-CoV-2 by FDA under an Emergency Use Authorization (EUA). This EUA will remain in effect (meaning this test can be used) for the duration of the COVID-19 declaration  under Section 564(b)(1) of the Act, 21 U.S.C. section 360bbb-3(b)(1), unless the authorization is terminated or revoked.  Performed at [facility], [place] [address] [place], [place] [address]       Radiology Studies: No results found.     LOS: 0 days   Randall Brickle [employer] on [email or web address]  04/12/2021, 2:02 PM

## 2021-04-12 NOTE — Assessment & Plan Note (Addendum)
All medications were discontinued.

## 2021-04-12 NOTE — ED Triage Notes (Signed)
Pt bib EMS for rectal bleeding that started this moirning. Pt fell and at 2200 and when the staff was changing him his depends was full of blood with clots. Pt on eloquis, last dose this morning. Abdomen tender to the touch   97% RA  Paced rhythm 80/40

## 2021-04-12 NOTE — Assessment & Plan Note (Addendum)
Medications discontinued

## 2021-04-12 NOTE — Assessment & Plan Note (Addendum)
Comfort care  °

## 2021-04-12 NOTE — ED Notes (Signed)
Condom cath placed on pt 

## 2021-04-12 NOTE — ED Notes (Signed)
Pt has been asleep mostly since shift change at 7AM. Pt is easy to arouse by calling pt. No acute distress observed. Pt appears comfortable on bed. Will continue to monitor.

## 2021-04-12 NOTE — H&P (Signed)
History and Physical    Randall Odom IWP:[id number] DOB: 04/02/1926 DOA: 04/12/2021  DOS: the patient was seen and examined on 04/12/2021  PCP: Prince Solian, MD   Patient coming from: SN[facility]  I have personally briefly reviewed patient's old medical records in [place]  CC: GI bleed HPI: 88 year old male with a history of A. fib, status post pacemaker, dementia, coronary disease, diabetes, CKD stage IIIa who was recently discharged to [facility] on 03/26/2021 after spending about 2 weeks in the hospital for [place].  Son states the patient has been steadily declining for the last year and a half.  Today, son was notified the patient had fallen out of bed.  He was also notified the patient had a diaper full of blood clots.  Son states that he is ready for the patient to be placed in terminal care.  Son's name is Primary school teacher.  He identifies himself as the patient's healthcare power of attorney.  He does not want the patient to have any blood transfusions.  He does not want the patient have any GI procedures.  He wants to keep the patient comfortable.  He wants the patient be placed on hospice care.   ED Course: Son wants the patient to be placed in hospice care.  He wants terminal care.  Hemoccult positive.  Review of Systems:  Review of Systems  Unable to perform ROS: Dementia   Past Medical History:  Diagnosis Date   Acute renal failure ([place]) 05/12/2012   D/c'd acei 09/10/2016 and rec hold lasix until po intake normalized    Allergic rhinitis    Anxiety    Atrial fibrillation with controlled ventricular response (HCC)    C. difficile colitis 05/13/2012   CAD (coronary artery disease)    Carpal tunnel syndrome    Chronic combined systolic and diastolic CHF (congestive heart failure) (HCC)    Complete heart block (HCC)    Depressive disorder, not elsewhere classified    Diabetes mellitus    Diverticulosis of colon (without mention of hemorrhage)    DJD  (degenerative joint disease)    Enlarged prostate    Erectile dysfunction    Glaucoma    History of glaucoma   History of BPH    Hypertension    Pure hypercholesterolemia    Recurrent Clostridium difficile diarrhea 08/14/2020   Recurrent falls    Sacroiliitis, not elsewhere classified ([place])    Thoracic or lumbosacral neuritis or radiculitis, unspecified     Past Surgical History:  Procedure Laterality Date   APPENDECTOMY  1966   EP IMPLANTABLE DEVICE N/A 11/16/2015   PPM generator change by Dr Rayann Heman,  [facility] [employer] SR PPM for complete heart block   PACEMAKER INSERTION  09/28/1997   DDD for high-grade AV Block. Had an upgrade to a dual chamber device on 09/09/97. Replaced dual chamber battery on 12/06/04. New device is a Financial risk analyst Identity ADX XL DR, model [id number], seriel [id number].   TONSILLECTOMY  1953     reports that he quit smoking about 53 years ago. His smoking use included cigarettes. He has a 37.50 pack-year smoking history. He has never used smokeless tobacco. He reports that he does not drink alcohol and does not use drugs.  Allergies  Allergen Reactions   Spironolactone Diarrhea and Nausea And Vomiting   Ciprofloxacin Other (See Comments)    C-diff    Family History  Problem Relation Age of Onset   Cancer Mother  breast   CVA Father    Anemia Brother    Heart disease Brother     Prior to Admission medications   Medication Sig Start Date End Date Taking? Authorizing Provider  ELIQUIS 5 MG TABS tablet TAKE 1 TABLET BY MOUTH TWICE A DAY Patient taking differently: Take 5 mg by mouth 2 (two) times daily. 07/12/19   Allred, Jeneen Rinks, MD  furosemide (LASIX) 40 MG tablet Take 0.5 tablets (20 mg total) by mouth every other day. 03/26/21   Antonieta Pert, MD  guaiFENesin-dextromethorphan (ROBITUSSIN DM) 100-10 MG/5ML syrup Take 5 mLs by mouth every 4 (four) hours as needed for cough. 03/26/21   Antonieta Pert, MD  Multiple Vitamin (MULTIVITAMIN WITH MINERALS) TABS Take 1 tablet by mouth  daily. 05/27/12   Angiulli, Lavon Paganini, PA-C  Saw Palmetto 450 MG CAPS Take 450 mg by mouth daily.    [provider]  simvastatin (ZOCOR) 40 MG tablet Take 1 tablet (40 mg total) by mouth at bedtime. 05/27/12   Angiulli, Lavon Paganini, PA-C  terazosin (HYTRIN) 5 MG capsule Take 1 capsule (5 mg total) by mouth at bedtime. 05/27/12   Cathlyn Parsons, PA-C    Physical Exam: Vitals:   04/12/21 0025 04/12/21 0030 04/12/21 0045 04/12/21 0100  BP:  (!) 118/48 (!) 112/51 (!) 112/43  Pulse:  84 68 80  Resp:  (!) 23 (!) 21 14  Temp:      TempSrc:      SpO2: 100% 99% 99% 99%    Physical Exam Vitals and nursing note reviewed.  Constitutional:      General: He is not in acute distress.    Appearance: He is ill-appearing. He is not toxic-appearing or diaphoretic.     Comments: Elderly, frail Caucasian male  HENT:     Head: Normocephalic.     Nose: No rhinorrhea.  Eyes:     General:        Right eye: Discharge present.        Left eye: Discharge present. Cardiovascular:     Rate and Rhythm: Normal rate and regular rhythm.     Heart sounds: Murmur heard.  Pulmonary:     Effort: Pulmonary effort is normal.  Abdominal:     General: Bowel sounds are normal.     Tenderness: There is abdominal tenderness. There is no guarding or rebound.  Skin:    General: Skin is warm and dry.  Neurological:     Mental Status: He is disoriented.     Labs on Admission: I have personally reviewed following labs and imaging studies  CBC: No results for input(s): WBC, NEUTROABS, HGB, HCT, MCV, PLT in the last 168 hours. Basic Metabolic Panel: No results for input(s): NA, K, CL, CO2, GLUCOSE, BUN, CREATININE, CALCIUM, MG, PHOS in the last 168 hours. GFR: CrCl cannot be calculated (Unknown ideal weight.). Liver Function Tests: No results for input(s): AST, ALT, ALKPHOS, BILITOT, PROT, ALBUMIN in the last 168 hours. No results for input(s): LIPASE, AMYLASE in the last 168 hours. No results for  input(s): AMMONIA in the last 168 hours. Coagulation Profile: No results for input(s): INR, PROTIME in the last 168 hours. Cardiac Enzymes: No results for input(s): CKTOTAL, CKMB, CKMBINDEX, TROPONINI in the last 168 hours. BNP (last 3 results) No results for input(s): PROBNP in the last 8760 hours. HbA1C: No results for input(s): HGBA1C in the last 72 hours. CBG: No results for input(s): GLUCAP in the last 168 hours. Lipid Profile: No results for  input(s): CHOL, HDL, LDLCALC, TRIG, CHOLHDL, LDLDIRECT in the last 72 hours. Thyroid Function Tests: No results for input(s): TSH, T4TOTAL, FREET4, T3FREE, THYROIDAB in the last 72 hours. Anemia Panel: No results for input(s): VITAMINB12, FOLATE, FERRITIN, TIBC, IRON, RETICCTPCT in the last 72 hours. Urine analysis:    Component Value Date/Time   COLORURINE YELLOW 03/13/2021 1919   APPEARANCEUR CLOUDY (A) 03/13/2021 1919   LABSPEC 1.020 03/13/2021 1919   PHURINE 6.0 03/13/2021 1919   GLUCOSEU NEGATIVE 03/13/2021 1919   HGBUR LARGE (A) 03/13/2021 1919   BILIRUBINUR NEGATIVE 03/13/2021 1919   KETONESUR NEGATIVE 03/13/2021 1919   PROTEINUR NEGATIVE 03/13/2021 1919   UROBILINOGEN 0.2 05/26/2012 0623   NITRITE NEGATIVE 03/13/2021 1919   LEUKOCYTESUR SMALL (A) 03/13/2021 1919    Radiological Exams on Admission: I have personally reviewed images No results found.  EKG: I have personally reviewed EKG: appears to be paced rhythm   Assessment/Plan Principal Problem:   Admission for terminal care Active Problems:   GI bleeding   Diabetes mellitus ([place])   Pacemaker   CAD (coronary artery disease)   Permanent atrial fibrillation (HCC)   Chronic combined systolic and diastolic heart failure (HCC)   Dementia (HCC)   Stage 3a chronic kidney disease (CKD) ([place])    Assessment and Plan: * Admission for terminal care Admit to MedSurg bed.  Discussed with the patient's son Randall Odom who identifies himself as the patient's healthcare power  of attorney.  He states that he would like to pursue comfort care for his father.  He states that the patient's quality life is quite poor.  He states that the patient's dementia has gotten worse over the last year and a half.  He states the patient is now having visual hallucinations.  He does not want the GI bleed treated.  Does not want the patient to receive any transfusions.  He does not want the patient to receive any endoscopies.  Patient does not appear to be in any pain at this point.  Will prescribe as needed morphine for any pain he might have in the future.  Son states that he was the power of attorney for his aunt who recently died with hospice while at [facility].  He states that he would like the patient to be enrolled in hospice and possibly transfer back to [facility] with hospice care.      GI bleeding Stop Eliquis.  Son does not want transfusions or GI work-up.  Do not repeat any lab work.  Stage 3a chronic kidney disease (CKD) (HCC) Chronic.  Dementia ([place])- (present on admission) Patient states dementia has gotten worse.  Son confirms patient is DNR.  Chronic combined systolic and diastolic heart failure ([place])- (present on admission) Stop all CHF medications.  Permanent atrial fibrillation ([place])- (present on admission) Chronic.  CAD (coronary artery disease)- (present on admission) Stop all home medications.  Pacemaker- (present on admission) Patient has a pacemaker.  May need to place a magnet over the pacemaker when he is actively dying.  Diabetes mellitus ([place]) Stop all medications.  Full liquid diet    DVT prophylaxis:  None. Son refused. Code Status: Comfort Care Family Communication: discussed with pt's son/HCPOA Randall Odom at bedside  Disposition Plan: return to SNF with hospice  Consults called: none  Admission status: Inpatient, Med-Surg   Kristopher Oppenheim, DO [facility] 04/12/2021, 1:22 AM

## 2021-04-12 NOTE — ED Notes (Signed)
Comfort care discussed with EDP

## 2021-04-12 NOTE — Assessment & Plan Note (Addendum)
Admitting provider discussed with patient's son Jeanell Sparrow who is the patient's healthcare power of attorney.  Comfort care strategy being pursued.   Patient has been hemodynamically stable.  Noted to be awake and alert this morning though confused.  We will have to see how his oral intake is.  Initially he it was felt that his life expectancy was 2 weeks unless because of his unresponsive status.   Residential hospice was being pursued. However considering patient's improvement his life expectancy is now thought to be greater than 2 weeks.  So he will be returning to skilled nursing facility with hospice.

## 2021-04-12 NOTE — Subjective & Objective (Signed)
CC: GI bleed HPI: 88 year old male with a history of A. fib, status post pacemaker, dementia, coronary disease, diabetes, CKD stage IIIa who was recently discharged to [facility] on 03/26/2021 after spending about 2 weeks in the hospital for [place].  Son states the patient has been steadily declining for the last year and a half.  Today, son was notified the patient had fallen out of bed.  He was also notified the patient had a diaper full of blood clots.  Son states that he is ready for the patient to be placed in terminal care.  Son's name is Primary school teacher.  He identifies himself as the patient's healthcare power of attorney.  He does not want the patient to have any blood transfusions.  He does not want the patient have any GI procedures.  He wants to keep the patient comfortable.  He wants the patient be placed on hospice care.

## 2021-04-12 NOTE — Progress Notes (Signed)
[facility]) Hospital Liaison Note  Received request from Transitions of Care Manager [place] for family interest in [facility]. Visited patient at bedside and spoke with son/Ray to confirm interest and explain services.  Approval for [employer] is determined by [facility] MD. Once [facility] MD has determined [facility] eligibility, Brock will update hospital staff and family.  Please do not hesitate to call with any hospice related questions.    Thank you for the opportunity to participate in this patient's care.  Daphene Calamity, MSW [facility] Liaison  [phone]

## 2021-04-12 NOTE — ED Notes (Signed)
Pt provided juice and made comfortable in bed

## 2021-04-12 NOTE — ED Notes (Signed)
MD came in and checked on pt. Pt moves around the bed and can be aroused to touch. VS stable at this time. MD states he is going to update son who is POA to pt and will contact TOC for hospice placement. Will continue to monitor.

## 2021-04-12 NOTE — Assessment & Plan Note (Addendum)
Patient has a pacemaker.  

## 2021-04-12 NOTE — Hospital Course (Addendum)
88 year old male with a history of A. fib, status post pacemaker, dementia, coronary disease, diabetes, CKD stage IIIa who was recently discharged to [facility] on 03/26/2021 after spending about 2 weeks in the hospital for [place]. Son states the patient has been steadily declining for the last year and a half. On the day of admission patient had fallen out of bed.  He was noted to have a diaper full of blood clots.  Due to his declining condition son did not want any aggressive testing and wanted to pursue a more comfort care strategy.  He was interested in hospice.  Patient was transitioned to comfort care.

## 2021-04-12 NOTE — Discharge Planning (Signed)
TOC consulted regarding residential hospice.  RNCM placed palliative consult to help determine disposition.  Awaiting Palliative recommendations.

## 2021-04-12 NOTE — ED Notes (Signed)
Pt is arousable to calling his name. Pt is clean and dry at this time. Appears comfortable on bed. Will continue to monitor.

## 2021-04-12 NOTE — Assessment & Plan Note (Addendum)
Has had progressively worsening dementia.

## 2021-04-12 NOTE — Progress Notes (Addendum)
Pt arrived to 6N from ED, called son to update him

## 2021-04-12 NOTE — ED Provider Notes (Signed)
[facility] EMERGENCY DEPARTMENT Provider Note   CSN: [id number] Arrival date & time: 04/12/21  0007     History  Chief Complaint  Patient presents with   Rectal Bleeding    Randall Odom is a 88 y.o. male with a past medical history of atrial fibrillation (on Eliquis), diabetes, hypertension, dementia, heart block with pacer, heart failure with reduced EF, CAD.  Due to dementia level 5 caveat applies.  Presents to heart with a chief complaint of fall and rectal bleeding.  Per EMS patient suffered a fall at [facility] at 3 PM.  Fall was unwitnessed.  EMS also reports that facility staff noticed a small amount of blood with patient's stool earlier this morning.  After patient's fall they noticed a large amount of blood and blood clots in his depends.  Patient last received Eliquis earlier this morning.  Patient's son and power of attorney at bedside states that patient is normally alert to person and place.  Per chart review patient was admitted to the hospital 1/3 to 03/26/2021 for COVID-19 virus infection, physical deconditioning, and AKI.   Rectal Bleeding     Home Medications Prior to Admission medications   Medication Sig Start Date End Date Taking? Authorizing Provider  ELIQUIS 5 MG TABS tablet TAKE 1 TABLET BY MOUTH TWICE A DAY Patient taking differently: Take 5 mg by mouth 2 (two) times daily. 07/12/19   Allred, Jeneen Rinks, MD  furosemide (LASIX) 40 MG tablet Take 0.5 tablets (20 mg total) by mouth every other day. 03/26/21   Antonieta Pert, MD  guaiFENesin-dextromethorphan (ROBITUSSIN DM) 100-10 MG/5ML syrup Take 5 mLs by mouth every 4 (four) hours as needed for cough. 03/26/21   Antonieta Pert, MD  Multiple Vitamin (MULTIVITAMIN WITH MINERALS) TABS Take 1 tablet by mouth daily. 05/27/12   Angiulli, Lavon Paganini, PA-C  Saw Palmetto 450 MG CAPS Take 450 mg by mouth daily.    [provider]  simvastatin (ZOCOR) 40 MG tablet Take 1 tablet (40 mg  total) by mouth at bedtime. 05/27/12   Angiulli, Lavon Paganini, PA-C  terazosin (HYTRIN) 5 MG capsule Take 1 capsule (5 mg total) by mouth at bedtime. 05/27/12   Angiulli, Lavon Paganini, PA-C      Allergies    Spironolactone and Ciprofloxacin    Review of Systems   Review of Systems  Unable to perform ROS: Dementia  Gastrointestinal:  Positive for hematochezia.   Physical Exam Updated Vital Signs BP (!) 104/49    Pulse 75    Temp 99.4 F (37.4 C) (Oral)    Resp (!) 24    SpO2 100%  Physical Exam Vitals and nursing note reviewed.  Constitutional:      General: He is not in acute distress.    Appearance: He is not ill-appearing, toxic-appearing or diaphoretic.  HENT:     Head: Normocephalic and atraumatic.  Eyes:     General: No scleral icterus.       Right eye: No discharge.        Left eye: No discharge.     Extraocular Movements: Extraocular movements intact.     Conjunctiva/sclera: Conjunctivae normal.     Pupils: Pupils are equal, round, and reactive to light.  Cardiovascular:     Rate and Rhythm: Normal rate.  Pulmonary:     Effort: Pulmonary effort is normal. No tachypnea or bradypnea.     Breath sounds: Normal breath sounds. No stridor.  Abdominal:     General:  Abdomen is flat. There is no distension. There are no signs of injury.     Palpations: Abdomen is soft.     Tenderness: There is no abdominal tenderness.  Genitourinary:    Rectum: Guaiac result positive. Tenderness present. No mass, anal fissure, external hemorrhoid or internal hemorrhoid. Normal anal tone.     Comments: Tenderness on rectal exam.  Frank red blood noted in rectum.  No stool noted to rectal vault. Musculoskeletal:     Cervical back: Normal range of motion. No edema, erythema, signs of trauma, rigidity, torticollis or crepitus. No pain with movement, spinous process tenderness or muscular tenderness. Normal range of motion.     Comments: No midline tenderness or deformity to cervical, thoracic, lumbar  spine.  No tenderness, bony tenderness, or deformity to bilateral upper or lower extremities.  No pain on passive range of motion to bilateral upper and lower extremities.  No leg length discrepancy or rotation noted to bilateral lower extremities.  Pelvis stable.  Skin:    General: Skin is warm and dry.  Neurological:     General: No focal deficit present.     Mental Status: He is alert.     GCS: GCS eye subscore is 4. GCS verbal subscore is 5. GCS motor subscore is 6.     Comments: Alert to person place  Psychiatric:        Behavior: Behavior is cooperative.    ED Results / Procedures / Treatments   Labs (all labs ordered are listed, but only abnormal results are displayed) Labs Reviewed  POC OCCULT BLOOD, ED - Abnormal; Notable for the following components:      Result Value   Fecal Occult Bld POSITIVE (*)    All other components within normal limits    EKG None  Radiology No results found.  Procedures Procedures    Medications Ordered in ED Medications - No data to display  ED Course/ Medical Decision Making/ A&P                           Medical Decision Making Risk Decision regarding hospitalization.   Alert 88 year old male in no acute distress, nontoxic-appearing.  Presents to the emergency department with a complaint of fall and rectal bleeding.  Patient has medical history as noted in HPI which complicate his care.  Information was obtained from patient and patient's son at bedside.  Due to patient's history of dementia level 5 caveat applies.  Patient's son reports that he has medical power of attorney.  States that they do not want patient to have any blood transfusions or work-up for current GI bleed.  Patient's son is agreeable with palliative care at this time.  Will consult hospitalist for admission for palliative care and observation for patient's GI bleed.  Patient was discussed with and evaluated by Dr. Sedonia Small.  Spoke to hospitalist Dr.Chen who will  see the patient for admission.        Final Clinical Impression(s) / ED Diagnoses Final diagnoses:  Rectal bleeding    Rx / DC Orders ED Discharge Orders     None         Loni Beckwith, PA-C 04/12/21 0319    Maudie Flakes, MD 04/12/21 [phone]

## 2021-04-12 NOTE — Progress Notes (Signed)
Mobility Specialist Progress Note:   04/12/21 1430  Mobility  Activity Transferred from chair to bed (stretcher to bed)  Level of Assistance Total care  Assistive Device Other (Comment) (sheets and pads under pt)  Activity Response Tolerated well  $Mobility charge 1 Mobility   Pt arrived to floor, RN requesting assistance for transferring pt from stretcher to bed. Pt requiring total care at this time.   Nelta Numbers Mobility Specialist  Phone [phone]

## 2021-04-12 NOTE — ED Notes (Signed)
Pt resting comfortably at this time.

## 2021-04-13 MED ORDER — POLYETHYLENE GLYCOL 3350 17 G PO PACK: 17.0000 g | PACK | Freq: Every day | ORAL | 0 refills | Status: AC

## 2021-04-13 MED ORDER — MORPHINE SULFATE 10 MG/5ML PO SOLN
2.0000 mg | ORAL | 0 refills | Status: AC | PRN
Start: 2021-04-13 — End: ?

## 2021-04-13 MED ORDER — LORAZEPAM 1 MG PO TABS
1.0000 mg | ORAL_TABLET | ORAL | 0 refills | Status: AC | PRN
Start: 2021-04-13 — End: ?

## 2021-04-13 NOTE — Progress Notes (Signed)
[facility] PROGRESS NOTE   Randall Odom OVF:[id number] DOB: [birth year] DOA: 04/12/2021  1 DOS: the patient was seen and examined on 04/13/2021  PCP: Wenda Low, MD  Brief History and Hospital Course:  88 year old male with a history of A. fib, status post pacemaker, dementia, coronary disease, diabetes, CKD stage IIIa who was recently discharged to [facility] on 03/26/2021 after spending about 2 weeks in the hospital for [place]. Son states the patient has been steadily declining for the last year and a half. On the day of admission patient had fallen out of bed.  He was noted to have a diaper full of blood clots.  Due to his declining condition son did not want any aggressive testing and wanted to pursue a more comfort care strategy.  He was interested in hospice.  Patient was transition to comfort care. Residential hospice being pursued.  However this morning patient is noted to be awake and alert.  Wants something to eat.  Noted to be confused.  Consultants: None  Procedures: None    Subjective: Patient noted to be awake and alert this morning.  Asking for ice cream.    Assessment/Plan:  * Admission for terminal care Admitting provider discussed with patient's son Ray who is the patient's healthcare power of attorney.  Comfort care strategy being pursued.   Patient has been hemodynamically stable.  Noted to be awake and alert this morning though confused.  We will have to see how his oral intake is.  Initially he it was felt that his life expectancy was 2 weeks unless because of his unresponsive status.   Residential hospice is being pursued.  GI bleeding Son does not desire any further work-up for this.  Anticoagulation discontinued.  Diabetes mellitus ([place]) All medications were discontinued.  Pacemaker- (present on admission) Patient has a pacemaker.   CAD (coronary artery disease)- (present on admission) Medications discontinued.  Permanent  atrial fibrillation ([place])- (present on admission) Chronic.  Chronic combined systolic and diastolic heart failure ([place])- (present on admission) Comfort care  Dementia [facility])- (present on admission) Has had progressively worsening dementia.     DVT Prophylaxis: Comfort care Code Status: DNR Family Communication: Son was updated yesterday. Disposition Plan: Residential hospice being pursued.  Status is: Inpatient Remains inpatient appropriate because: Comfort care.      Medications: Scheduled: Continuous: ACZ:[id number] **OR** acetaminophen, antiseptic oral rinse, glycopyrrolate **OR** glycopyrrolate **OR** glycopyrrolate, haloperidol **OR** haloperidol **OR** haloperidol lactate, LORazepam **OR** [DISCONTINUED] LORazepam **OR** LORazepam, morphine injection, ondansetron **OR** ondansetron (ZOFRAN) IV, polyvinyl alcohol  Antibiotics: Anti-infectives (From admission, onward)    None       Objective:  Vital Signs  Vitals:   04/12/21 0830 04/12/21 0845 04/12/21 1158 04/13/21 0425  BP:   (!) 107/50 (!) 114/58  Pulse: 77 73 74 67  Resp: 16 17 17 18   Temp:    98.2 F (36.8 C)  TempSrc:    Oral  SpO2: 96% 97% 97% 97%   No intake or output data in the 24 hours ending 04/13/21 0959 There were no vitals filed for this visit.  Noted to be awake and alert this morning.  No distress Assessment is normal regular. Lungs are clear to auscultation bilaterally. Abdomen is soft.   Lab Results:   Recent Results (from the past 240 hour(s))  Resp Panel by RT-PCR (Flu A&B, Covid) Nasopharyngeal Swab     Status: None   Collection Time: 04/12/21  5:34 AM   Specimen: Nasopharyngeal Swab; Nasopharyngeal(NP) swabs  in vial transport medium  Result Value Ref Range Status   SARS Coronavirus 2 by RT PCR NEGATIVE NEGATIVE Final    Comment: (NOTE) SARS-CoV-2 target nucleic acids are NOT DETECTED.  The SARS-CoV-2 RNA is generally detectable in upper respiratory specimens during  the acute phase of infection. The lowest concentration of SARS-CoV-2 viral copies this assay can detect is 138 copies/mL. A negative result does not preclude SARS-Cov-2 infection and should not be used as the sole basis for treatment or other patient management decisions. A negative result may occur with  improper specimen collection/handling, submission of specimen other than nasopharyngeal swab, presence of viral mutation(s) within the areas targeted by this assay, and inadequate number of viral copies(<138 copies/mL). A negative result must be combined with clinical observations, patient history, and epidemiological information. The expected result is Negative.  Fact Sheet for Patients:  EntrepreneurPulse.com.au  Fact Sheet for Healthcare Providers:  IncredibleEmployment.be  This test is no t yet approved or cleared by the [place] FDA and  has been authorized for detection and/or diagnosis of SARS-CoV-2 by FDA under an Emergency Use Authorization (EUA). This EUA will remain  in effect (meaning this test can be used) for the duration of the COVID-19 declaration under Section 564(b)(1) of the Act, 21 U.S.C.section 360bbb-3(b)(1), unless the authorization is terminated  or revoked sooner.       Influenza A by PCR NEGATIVE NEGATIVE Final   Influenza B by PCR NEGATIVE NEGATIVE Final    Comment: (NOTE) The Xpert Xpress SARS-CoV-2/FLU/RSV plus assay is intended as an aid in the diagnosis of influenza from Nasopharyngeal swab specimens and should not be used as a sole basis for treatment. Nasal washings and aspirates are unacceptable for Xpert Xpress SARS-CoV-2/FLU/RSV testing.  Fact Sheet for Patients: EntrepreneurPulse.com.au  Fact Sheet for Healthcare Providers: IncredibleEmployment.be  This test is not yet approved or cleared by the [place] FDA and has been authorized for detection and/or  diagnosis of SARS-CoV-2 by FDA under an Emergency Use Authorization (EUA). This EUA will remain in effect (meaning this test can be used) for the duration of the COVID-19 declaration under Section 564(b)(1) of the Act, 21 U.S.C. section 360bbb-3(b)(1), unless the authorization is terminated or revoked.  Performed at [facility], [place] [address], [place], [place] [address]       Radiology Studies: No results found.     LOS: 1 day   Faithann Natal [employer] on [email or web address]  04/13/2021, 9:59 AM

## 2021-04-13 NOTE — Progress Notes (Signed)
Patient discharged to home with instructions given to [facility], nurse  at [facility]. Transported via [facility].

## 2021-04-13 NOTE — TOC Transition Note (Signed)
Transition of Care [facility]) - CM/SW Discharge Note   Patient Details  Name: Randall Odom MRN: [id number] Date of Birth: 1927/02/04  Transition of Care [facility]) CM/SW Contact:  Geralynn Ochs, LCSW Phone Number: 04/13/2021, 12:16 PM   Clinical Narrative:   CSW notified by Lonia Chimera that patient meets criteria for hospice care, but not [facility]. [facility] able to admit back today with hospice. CSW notified son, arranged transport with [facility].  Nurse to call report to [phone], Room 507.    Final next level of care: Skilled Nursing Facility Barriers to Discharge: Barriers Resolved   Patient Goals and CMS Choice Patient states their goals for this hospitalization and ongoing recovery are:: patient unable to participate in goal setting CMS Medicare.gov Compare Post Acute Care list provided to:: Patient Represenative (must comment) Choice offered to / list presented to : Adult Children  Discharge Placement              Patient chooses bed at: [facility] Patient to be transferred to facility by: [place] Name of family member notified: Ray Patient and family notified of of transfer: 04/13/21  Discharge Plan and Services                                     Social Determinants of Health (SDOH) Interventions     Readmission Risk Interventions No flowsheet data found.

## 2021-04-13 NOTE — Progress Notes (Addendum)
[facility]) Hospital Liaison Note   At this time, per Hospice MD, patient is not a candidate for [facility] at this time as life expectancy exceeds two weeks. However, patient does meet eligibility for Hospice Services in LTC. ACC HLT will continue to monitor eligibility for [facility].   Addendum (12:09p)-- Plan is for patient to return to [facility] today with Hospice services. ACC/TOC/IDT/Family aware of above updates.   Please do not hesitate to call with any hospice related questions.    Thank you for the opportunity to participate in this patient's care.   Daphene Calamity, MSW [facility] Liaison  [phone]

## 2021-04-13 NOTE — Progress Notes (Signed)
Attempted to call report to [facility], receiving nurse not available at this time and will give me a call a soon as ready.

## 2021-04-13 NOTE — Discharge Summary (Signed)
[facility]  Physician Discharge Summary   Patient ID: Randall Odom MRN: [id number] DOB/AGE: [birth year] 86 y.o.  Admit date: 04/12/2021 Discharge date:   04/13/2021   PCP: Wenda Low, MD  DISCHARGE DIAGNOSES:  Principal Problem:   Admission for terminal care Active Problems:   Diabetes mellitus ([place])   GI bleeding   Pacemaker   CAD (coronary artery disease)   Stage 3a chronic kidney disease (CKD) ([place])   Permanent atrial fibrillation (HCC)   Chronic combined systolic and diastolic heart failure ([place])   Dementia ([place])   RECOMMENDATIONS FOR OUTPATIENT FOLLOW UP: Hospice to follow patient at skilled nursing facility with plans to transition perhaps to residential hospice if patient's condition worsens.   Home Health: None Equipment/Devices: None  CODE STATUS: DNR  DISCHARGE CONDITION: poor  Diet recommendation: Soft diet as tolerated  INITIAL HISTORY: 88 year old male with a history of A. fib, status post pacemaker, dementia, coronary disease, diabetes, CKD stage IIIa who was recently discharged to [facility] home on 03/26/2021 after spending about 2 weeks in the hospital for [place]. Son states the patient has been steadily declining for the last year and a half. On the day of admission patient had fallen out of bed.  He was noted to have a diaper full of blood clots.  Due to his declining condition son did not want any aggressive testing and wanted to pursue a more comfort care strategy.  He was interested in hospice.  Patient was transitioned to comfort care.   Consultations: [place]:   * Admission for terminal care Admitting provider discussed with patient's son Ray who is the patient's healthcare power of attorney.  Comfort care strategy being pursued.   Patient has been hemodynamically stable.  Noted to be awake and alert this morning though confused.  We will have to see how his oral intake is.  Initially he it was felt  that his life expectancy was 2 weeks unless because of his unresponsive status.   Residential hospice was being pursued. However considering patient's improvement his life expectancy is now thought to be greater than 2 weeks.  So he will be returning to skilled nursing facility with hospice.  GI bleeding Son does not desire any further work-up for this.  Anticoagulation discontinued.  Diabetes mellitus ([place]) All medications were discontinued.  Pacemaker- (present on admission) Patient has a pacemaker.   CAD (coronary artery disease)- (present on admission) Medications discontinued.  Permanent atrial fibrillation ([place])- (present on admission) Chronic.  Chronic combined systolic and diastolic heart failure ([place])- (present on admission) Comfort care  Dementia [facility])- (present on admission) Has had progressively worsening dementia.   Patient to return to skilled nursing facility with hospice.   PERTINENT LABS:  The results of significant diagnostics from this hospitalization (including imaging, microbiology, ancillary and laboratory) are listed below for reference.    Microbiology: Recent Results (from the past 240 hour(s))  Resp Panel by RT-PCR (Flu A&B, Covid) Nasopharyngeal Swab     Status: None   Collection Time: 04/12/21  5:34 AM   Specimen: Nasopharyngeal Swab; Nasopharyngeal(NP) swabs in vial transport medium  Result Value Ref Range Status   SARS Coronavirus 2 by RT PCR NEGATIVE NEGATIVE Final    Comment: (NOTE) SARS-CoV-2 target nucleic acids are NOT DETECTED.  The SARS-CoV-2 RNA is generally detectable in upper respiratory specimens during the acute phase of infection. The lowest concentration of SARS-CoV-2 viral copies this assay can detect is 138 copies/mL. A negative result  does not preclude SARS-Cov-2 infection and should not be used as the sole basis for treatment or other patient management decisions. A negative result may occur with  improper specimen  collection/handling, submission of specimen other than nasopharyngeal swab, presence of viral mutation(s) within the areas targeted by this assay, and inadequate number of viral copies(<138 copies/mL). A negative result must be combined with clinical observations, patient history, and epidemiological information. The expected result is Negative.  Fact Sheet for Patients:  EntrepreneurPulse.com.au  Fact Sheet for Healthcare Providers:  IncredibleEmployment.be  This test is no t yet approved or cleared by the [place] FDA and  has been authorized for detection and/or diagnosis of SARS-CoV-2 by FDA under an Emergency Use Authorization (EUA). This EUA will remain  in effect (meaning this test can be used) for the duration of the COVID-19 declaration under Section 564(b)(1) of the Act, 21 U.S.C.section 360bbb-3(b)(1), unless the authorization is terminated  or revoked sooner.       Influenza A by PCR NEGATIVE NEGATIVE Final   Influenza B by PCR NEGATIVE NEGATIVE Final    Comment: (NOTE) The Xpert Xpress SARS-CoV-2/FLU/RSV plus assay is intended as an aid in the diagnosis of influenza from Nasopharyngeal swab specimens and should not be used as a sole basis for treatment. Nasal washings and aspirates are unacceptable for Xpert Xpress SARS-CoV-2/FLU/RSV testing.  Fact Sheet for Patients: EntrepreneurPulse.com.au  Fact Sheet for Healthcare Providers: IncredibleEmployment.be  This test is not yet approved or cleared by the [place] FDA and has been authorized for detection and/or diagnosis of SARS-CoV-2 by FDA under an Emergency Use Authorization (EUA). This EUA will remain in effect (meaning this test can be used) for the duration of the COVID-19 declaration under Section 564(b)(1) of the Act, 21 U.S.C. section 360bbb-3(b)(1), unless the authorization is terminated or revoked.  Performed at [facility], [place] [address], [place], [place] [address]      Labs:  COVID-19 Labs    Lab Results  Component Value Date   SARSCOV2NAA NEGATIVE 04/12/2021   SARSCOV2NAA POSITIVE (A) 03/13/2021       IMAGING STUDIES No results found.  DISCHARGE EXAMINATION: Vitals:   04/12/21 0830 04/12/21 0845 04/12/21 1158 04/13/21 0425  BP:   (!) 107/50 (!) 114/58  Pulse: 77 73 74 67  Resp: 16 17 17 18   Temp:    98.2 F (36.8 C)  TempSrc:    Oral  SpO2: 96% 97% 97% 97%   See progress note from earlier today  DISPOSITION: SNF  Discharge Instructions     Discharge instructions   Complete by: As directed    Please review instructions on the discharge summary.  You were cared for by a hospitalist during your hospital stay. If you have any questions about your discharge medications or the care you received while you were in the hospital after you are discharged, you can call the unit and asked to speak with the hospitalist on call if the hospitalist that took care of you is not available. Once you are discharged, your primary care physician will handle any further medical issues. Please note that NO REFILLS for any discharge medications will be authorized once you are discharged, as it is imperative that you return to your primary care physician (or establish a relationship with a primary care physician if you do not have one) for your aftercare needs so that they can reassess your need for medications and monitor your lab values. If you do not have  a primary care physician, you can call [phone] for a physician referral.   Increase activity slowly   Complete by: As directed           Allergies as of 04/13/2021       Reactions   Spironolactone Diarrhea, Nausea And Vomiting   Ciprofloxacin Other (See Comments)   C-diff        Medication List     STOP taking these medications    Eliquis 5 MG Tabs tablet Generic drug: apixaban   furosemide 40 MG tablet Commonly known  as: LASIX   multivitamin with minerals Tabs tablet   simvastatin 40 MG tablet Commonly known as: ZOCOR       TAKE these medications    guaiFENesin-dextromethorphan 100-10 MG/5ML syrup Commonly known as: ROBITUSSIN DM Take 5 mLs by mouth every 4 (four) hours as needed for cough.   LORazepam 1 MG tablet Commonly known as: ATIVAN Take 1 tablet (1 mg total) by mouth every 4 (four) hours as needed for anxiety.   mirtazapine 15 MG tablet Commonly known as: REMERON Take 15 mg by mouth at bedtime.   morphine 10 MG/5ML solution Take 1 mL (2 mg total) by mouth every 4 (four) hours as needed for severe pain.   ondansetron 4 MG tablet Commonly known as: ZOFRAN Take 4 mg by mouth every 8 (eight) hours as needed for nausea or vomiting.   polyethylene glycol 17 g packet Commonly known as: MIRALAX / GLYCOLAX Take 17 g by mouth daily.   Saw Palmetto 450 MG Caps Take 450 mg by mouth daily.   terazosin 5 MG capsule Commonly known as: HYTRIN Take 1 capsule (5 mg total) by mouth at bedtime.           TOTAL DISCHARGE TIME: 35 minutes  Prithvi Kooi [employer] on [email or web address]  04/13/2021, 12:01 PM

## 2021-04-13 NOTE — NC FL2 (Signed)
[place] MEDICAID FL2 LEVEL OF CARE SCREENING TOOL     IDENTIFICATION  Patient Name: Randall Odom Birthdate: 22-Jun-1926 Sex: male Admission Date (Current Location): 04/12/2021  [facility] and [place] Number:  Herbalist and Address:  [facility]. [facility], [place] [address] [place], [place] [address]      Provider Number: [id number]  Attending Physician Name and Address:  Bonnielee Haff, MD  Relative Name and Phone Number:       Current Level of Care: Hospital Recommended Level of Care: [place] Prior Approval Number:    Date Approved/Denied:   PASRR Number:    Discharge Plan: SNF    Current Diagnoses: Patient Active Problem List   Diagnosis Date Noted   Admission for terminal care 04/12/2021   GI bleeding 04/12/2021   Stage 3a chronic kidney disease (CKD) ([place]) 04/12/2021   Multiple falls 03/13/2021   Recurrent Clostridium difficile diarrhea 08/14/2020   Chronic respiratory failure with hypoxia ([place]) 09/11/2016   Dementia ([place]) 09/11/2016   Pulmonary infiltrate 08/30/2016   Abnormal CT of the chest 08/29/2016   Chronic anticoagulation 07/10/2016   Abnormal CT scan, chest 08/13/2013   Hyperlipidemia 08/09/2013   Essential hypertension 08/09/2013   Dyspnea on exertion 08/09/2013   Chronic combined systolic and diastolic heart failure ([place]) 08/09/2013   Permanent atrial fibrillation ([place]) 02/22/2013   Complete heart block ([place]) 02/22/2013   Occlusion and stenosis of carotid artery without mention of cerebral infarction 06/02/2012   Physical deconditioning 05/18/2012   Diabetes mellitus ([place]) 05/06/2011   Pacemaker 05/06/2011   CAD (coronary artery disease) 05/06/2011    Orientation RESPIRATION BLADDER Height & Weight     Self  Normal Incontinent Weight:   Height:     BEHAVIORAL SYMPTOMS/MOOD NEUROLOGICAL BOWEL NUTRITION STATUS      Continent Diet (comfort feeds)  AMBULATORY STATUS COMMUNICATION OF NEEDS Skin    Extensive Assist Verbally Normal                       Personal Care Assistance Level of Assistance  Bathing, Feeding, Dressing Bathing Assistance: Maximum assistance Feeding assistance: Limited assistance Dressing Assistance: Maximum assistance     Functional Limitations Info  Hearing   Hearing Info: Impaired      SPECIAL CARE FACTORS FREQUENCY                       Contractures Contractures Info: Not present    Additional Factors Info  Code Status, Allergies Code Status Info: DNR Allergies Info: Spironolactone, Ciprofloxacin           Current Medications (04/13/2021):  This is the current hospital active medication list Current Facility-Administered Medications  Medication Dose Route Frequency Provider Last Rate Last Admin   acetaminophen (TYLENOL) tablet 650 mg  650 mg Oral Q6H PRN Bonnielee Haff, MD       Or   acetaminophen (TYLENOL) suppository 650 mg  650 mg Rectal Q6H PRN Bonnielee Haff, MD       antiseptic oral rinse (BIOTENE) solution 15 mL  15 mL Topical PRN Bonnielee Haff, MD       glycopyrrolate (ROBINUL) tablet 1 mg  1 mg Oral Q4H PRN Bonnielee Haff, MD       Or   glycopyrrolate (ROBINUL) injection 0.2 mg  0.2 mg Subcutaneous Q4H PRN Bonnielee Haff, MD       Or   glycopyrrolate (ROBINUL) injection 0.2 mg  0.2 mg Intravenous  Q4H PRN Bonnielee Haff, MD       haloperidol (HALDOL) tablet 0.5 mg  0.5 mg Oral Q4H PRN Bonnielee Haff, MD       Or   haloperidol (HALDOL) 2 MG/ML solution 0.5 mg  0.5 mg Sublingual Q4H PRN Bonnielee Haff, MD       Or   haloperidol lactate (HALDOL) injection 0.5 mg  0.5 mg Intravenous Q4H PRN Bonnielee Haff, MD       LORazepam (ATIVAN) tablet 1 mg  1 mg Oral Q4H PRN Bonnielee Haff, MD       Or   LORazepam (ATIVAN) injection 1 mg  1 mg Intravenous Q4H PRN Bonnielee Haff, MD       morphine (PF) 2 MG/ML injection 2 mg  2 mg Intravenous Q1H PRN Bonnielee Haff, MD       ondansetron (ZOFRAN-ODT) disintegrating  tablet 4 mg  4 mg Oral Q6H PRN Bonnielee Haff, MD       Or   ondansetron [facility]) injection 4 mg  4 mg Intravenous Q6H PRN Bonnielee Haff, MD       polyvinyl alcohol (LIQUIFILM TEARS) 1.4 % ophthalmic solution 1 drop  1 drop Both Eyes QID PRN Bonnielee Haff, MD         Discharge Medications: Please see discharge summary for a list of discharge medications.  Relevant Imaging Results:  Relevant Lab Results:   Additional Information SS#: [id number]  Geralynn Ochs, LCSW

## 2021-05-17 ENCOUNTER — Ambulatory Visit (INDEPENDENT_AMBULATORY_CARE_PROVIDER_SITE_OTHER)

## 2021-05-17 DIAGNOSIS — I442 Atrioventricular block, complete: Secondary | ICD-10-CM | POA: Diagnosis not present

## 2021-05-21 ENCOUNTER — Telehealth: Payer: Self-pay | Admitting: Interventional Cardiology

## 2021-05-21 LAB — CUP PACEART REMOTE DEVICE CHECK
Battery Remaining Longevity: 64 mo
Battery Remaining Percentage: 53 %
Battery Voltage: 2.98 V
Brady Statistic RV Percent Paced: 88 %
Date Time Interrogation Session: 20230313101941
Implantable Lead Implant Date: 19990721
Implantable Lead Location: 753860
Implantable Pulse Generator Implant Date: 20170907
Lead Channel Impedance Value: 560 Ohm
Lead Channel Pacing Threshold Amplitude: 1.75 V
Lead Channel Pacing Threshold Pulse Width: 0.5 ms
Lead Channel Sensing Intrinsic Amplitude: 9.7 mV
Lead Channel Setting Pacing Amplitude: 2 V
Lead Channel Setting Pacing Pulse Width: 0.5 ms
Lead Channel Setting Sensing Sensitivity: 2 mV
Pulse Gen Model: 1272
Pulse Gen Serial Number: 7880882

## 2021-05-21 NOTE — Telephone Encounter (Signed)
Spoke with patients son, informed him that as long as monitor was plugged up beside patient that transmissions should come through automatically as scheduled. Patients son voiced understanding  ?

## 2021-05-21 NOTE — Telephone Encounter (Signed)
Calling to let us know patient heart monitor has been moved to [facility] an [facility]. Please advise  ?

## 2021-05-30 NOTE — Progress Notes (Signed)
Remote pacemaker transmission.   

## 2021-06-06 ENCOUNTER — Telehealth: Payer: Self-pay

## 2021-06-06 NOTE — Telephone Encounter (Signed)
[place] alert for HVR. HVR showing NSVT, HR 168, >20 beats, LOC prior to event. ? ?Spoke to patients son who reported patient is living at long term facility and fell out of bed yesterday. States patient had bed sores and tired to move around to change position, patient moved to close to the edge and fell out of the bed.  Denies event associated with dizziness or lightheaded. States the staff now has mattress on the floor to prevent injury.  Compliant with medications on file. Routing to Dr. Rayann Heman per protocol.  ? ? ? ? ? ?

## 2021-07-06 ENCOUNTER — Ambulatory Visit: Payer: Medicare Other | Admitting: Interventional Cardiology

## 2021-08-16 ENCOUNTER — Ambulatory Visit (INDEPENDENT_AMBULATORY_CARE_PROVIDER_SITE_OTHER)

## 2021-08-16 DIAGNOSIS — I442 Atrioventricular block, complete: Secondary | ICD-10-CM

## 2021-08-16 LAB — CUP PACEART REMOTE DEVICE CHECK
Battery Remaining Longevity: 60 mo
Battery Remaining Percentage: 51 %
Battery Voltage: 2.98 V
Brady Statistic RV Percent Paced: 88 %
Date Time Interrogation Session: 20230608020012
Implantable Lead Implant Date: 19990721
Implantable Lead Location: 753860
Implantable Pulse Generator Implant Date: 20170907
Lead Channel Impedance Value: 560 Ohm
Lead Channel Pacing Threshold Amplitude: 2 V
Lead Channel Pacing Threshold Pulse Width: 0.5 ms
Lead Channel Sensing Intrinsic Amplitude: 6.9 mV
Lead Channel Setting Pacing Amplitude: 2.25 V
Lead Channel Setting Pacing Pulse Width: 0.5 ms
Lead Channel Setting Sensing Sensitivity: 2 mV
Pulse Gen Model: 1272
Pulse Gen Serial Number: 7880882

## 2021-08-24 NOTE — Progress Notes (Signed)
Remote pacemaker transmission.   

## 2021-09-08 DEATH — deceased
